# Patient Record
Sex: Male | Born: 1955 | Race: White | Hispanic: No | Smoking: Current every day smoker
Health system: Southern US, Community
[De-identification: ages and names within clinical notes are randomized; demographics above are authoritative.]

## PROBLEM LIST (undated history)

## (undated) DIAGNOSIS — Z85820 Personal history of malignant melanoma of skin: Secondary | ICD-10-CM

## (undated) DIAGNOSIS — K219 Gastro-esophageal reflux disease without esophagitis: Secondary | ICD-10-CM

## (undated) DIAGNOSIS — M719 Bursopathy, unspecified: Secondary | ICD-10-CM

## (undated) DIAGNOSIS — R918 Other nonspecific abnormal finding of lung field: Secondary | ICD-10-CM

## (undated) DIAGNOSIS — E43 Unspecified severe protein-calorie malnutrition: Secondary | ICD-10-CM

## (undated) DIAGNOSIS — J9611 Chronic respiratory failure with hypoxia: Secondary | ICD-10-CM

## (undated) DIAGNOSIS — J449 Chronic obstructive pulmonary disease, unspecified: Secondary | ICD-10-CM

## (undated) DIAGNOSIS — J432 Centrilobular emphysema: Secondary | ICD-10-CM

## (undated) DIAGNOSIS — Z720 Tobacco use: Secondary | ICD-10-CM

## (undated) DIAGNOSIS — Z87442 Personal history of urinary calculi: Secondary | ICD-10-CM

## (undated) DIAGNOSIS — D649 Anemia, unspecified: Secondary | ICD-10-CM

## (undated) DIAGNOSIS — C3490 Malignant neoplasm of unspecified part of unspecified bronchus or lung: Secondary | ICD-10-CM

## (undated) DIAGNOSIS — Z79891 Long term (current) use of opiate analgesic: Secondary | ICD-10-CM

## (undated) DIAGNOSIS — F4320 Adjustment disorder, unspecified: Secondary | ICD-10-CM

## (undated) DIAGNOSIS — R52 Pain, unspecified: Secondary | ICD-10-CM

## (undated) DIAGNOSIS — R079 Chest pain, unspecified: Secondary | ICD-10-CM

## (undated) DIAGNOSIS — G4700 Insomnia, unspecified: Secondary | ICD-10-CM

## (undated) DIAGNOSIS — M109 Gout, unspecified: Secondary | ICD-10-CM

## (undated) DIAGNOSIS — M1990 Unspecified osteoarthritis, unspecified site: Secondary | ICD-10-CM

## (undated) DIAGNOSIS — G8929 Other chronic pain: Secondary | ICD-10-CM

## (undated) DIAGNOSIS — R0902 Hypoxemia: Secondary | ICD-10-CM

## (undated) DIAGNOSIS — I10 Essential (primary) hypertension: Secondary | ICD-10-CM

## (undated) DIAGNOSIS — J45909 Unspecified asthma, uncomplicated: Secondary | ICD-10-CM

## (undated) HISTORY — DX: Personal history of malignant melanoma of skin: Z85.820

## (undated) HISTORY — PX: SKIN GRAFT: SHX250

---

## 2005-01-03 ENCOUNTER — Emergency Department: Payer: Self-pay | Admitting: Emergency Medicine

## 2005-02-12 ENCOUNTER — Emergency Department: Payer: Self-pay | Admitting: Emergency Medicine

## 2005-12-04 ENCOUNTER — Emergency Department: Payer: Self-pay | Admitting: Emergency Medicine

## 2005-12-04 ENCOUNTER — Other Ambulatory Visit: Payer: Self-pay

## 2009-03-12 ENCOUNTER — Emergency Department: Payer: Self-pay | Admitting: Emergency Medicine

## 2009-05-11 ENCOUNTER — Emergency Department: Payer: Self-pay | Admitting: Emergency Medicine

## 2009-06-21 ENCOUNTER — Emergency Department: Payer: Self-pay | Admitting: Emergency Medicine

## 2010-04-21 ENCOUNTER — Emergency Department: Payer: Self-pay | Admitting: Emergency Medicine

## 2010-08-29 ENCOUNTER — Emergency Department: Payer: Self-pay | Admitting: Emergency Medicine

## 2011-09-18 ENCOUNTER — Emergency Department (HOSPITAL_COMMUNITY): Payer: Medicaid Other

## 2011-09-18 ENCOUNTER — Encounter (HOSPITAL_COMMUNITY): Payer: Self-pay | Admitting: *Deleted

## 2011-09-18 ENCOUNTER — Emergency Department (HOSPITAL_COMMUNITY)
Admission: EM | Admit: 2011-09-18 | Discharge: 2011-09-18 | Disposition: A | Discharge status: Home / Self Care | Payer: Medicaid Other | Attending: Emergency Medicine | Admitting: Emergency Medicine

## 2011-09-18 DIAGNOSIS — R079 Chest pain, unspecified: Secondary | ICD-10-CM | POA: Insufficient documentation

## 2011-09-18 DIAGNOSIS — R0789 Other chest pain: Secondary | ICD-10-CM | POA: Insufficient documentation

## 2011-09-18 DIAGNOSIS — M546 Pain in thoracic spine: Secondary | ICD-10-CM | POA: Insufficient documentation

## 2011-09-18 MED ORDER — KETOROLAC TROMETHAMINE 30 MG/ML IJ SOLN
30.0000 mg | Freq: Once | INTRAMUSCULAR | Status: AC
  Administered 2011-09-18: 30 mg via INTRAMUSCULAR
  Filled 2011-09-18: qty 1

## 2011-09-18 MED ORDER — IBUPROFEN 800 MG PO TABS: 800.0000 mg | ORAL_TABLET | Freq: Three times a day (TID) | ORAL | Status: AC

## 2011-09-18 NOTE — ED Provider Notes (Signed)
History     CSN: [id number]  Arrival date & time 09/18/11  0138   First MD Initiated Contact with Patient 09/18/11 0230      Chief Complaint  Patient presents with  . Chest Pain     HPI  History provided by the patient. Patient is a 56 year old male with no significant past medical history who presents with complaints of left back and side pains that began 24 hours ago after lifting a heavy rubber mat. Patient states that he was moving the mat so his wife to clean it. At that time patient's reports feeling a pop below his left shoulder blade and back area. Since that time he has had pain primarily to the back area that does radiate around to the front part of his chest. Patient states she felt it was probably just a muscle pull and was resting all day yesterday. Patient was using heat to the area yesterday without significant improvements. He did not try any medications. Symptoms are worse with movements. Patient denies any alleviating factors. This evening he was driving with his wife for her overnight work shift and he was still having discomfort while driving and having his arm on the steering well. Patient decided to come to emergency room for further evaluation. He denies having any shortness of breath. Patient denies any heart palpitations, nausea, diaphoresis. Patient is a current smoker but denies any other cardiac risk factors.    History reviewed. No pertinent past medical history.  History reviewed. No pertinent past surgical history.  History reviewed. No pertinent family history.  History  Substance Use Topics  . Smoking status: Not on file  . Smokeless tobacco: Not on file  . Alcohol Use: Not on file      Review of Systems  Constitutional: Negative for fever, chills and diaphoresis.  Respiratory: Negative for cough and shortness of breath.   Cardiovascular: Negative for palpitations.  Gastrointestinal: Negative for nausea and abdominal pain.  All other systems  reviewed and are negative.    Allergies  Review of patient's allergies indicates no known allergies.  Home Medications  No current outpatient prescriptions on file.  BP 135/77  Pulse 71  Temp(Src) 98 F (36.7 C) (Oral)  Resp 20  SpO2 99%  Physical Exam  Nursing note and vitals reviewed. Constitutional: He is oriented to person, place, and time. He appears well-developed and well-nourished. No distress.  HENT:  Head: Normocephalic and atraumatic.  Cardiovascular: Normal rate and regular rhythm.   Pulmonary/Chest: Effort normal and breath sounds normal. No respiratory distress. He has no wheezes. He has no rales.   He exhibits tenderness.         Patient with tenderness to palpation along lateral aspect of mid back wrapping around the chest wall and rib area. No deformity of ribs or step-off. No crepitus. Lung sounds are normal and clear.  Abdominal: Soft. Bowel sounds are normal. He exhibits no distension. There is no tenderness. There is no rebound.  Musculoskeletal: He exhibits no edema and no tenderness.  Neurological: He is alert and oriented to person, place, and time.  Skin: Skin is warm. No rash noted.  Psychiatric: He has a normal mood and affect. His behavior is normal.    ED Course  Procedures     Dg Ribs Unilateral W/chest Left  09/18/2011  *RADIOLOGY REPORT*  Clinical Data: Left posterior rib pain and shortness of breath.  No injury.  LEFT RIBS AND CHEST - 3+ VIEW  Comparison: None.  Findings: Normal heart size and pulmonary vascularity. Emphysematous changes and scattered fibrosis in the lungs.  No focal airspace consolidation.  No blunting of costophrenic angles. No pneumothorax.  The left ribs appear intact.  No evidence of acute fracture or subluxation.  No focal lesions or expansile changes suggested. Costochondral calcifications.  Degenerative changes in the thoracic spine.  IMPRESSION: No evidence of active pulmonary disease.  Visualized left ribs appear  intact.  Original Report Authenticated By: Marlon Pel, M.D.     1. Musculoskeletal chest pain       MDM  2:30AM patient seen and evaluated. Patient in no acute distress. Patient with normal respirations and good O2 sats. No clinical signs for DVT. No symptoms concerning for PE.   Patient discussed with attending physician. He agrees with workup and treatment plan.    Date: 09/18/2011  Rate: 76  Rhythm: normal sinus rhythm  QRS Axis: normal  Intervals: normal  ST/T Wave abnormalities: normal  Conduction Disutrbances:none  Narrative Interpretation:   Old EKG Reviewed: none available      Angus Seller, [place] 09/18/11 [phone]

## 2011-09-18 NOTE — ED Notes (Signed)
Pr ready and stable for discharge

## 2011-09-18 NOTE — Discharge Instructions (Signed)
You were seen and evaluated today for your complaints of chest and back pains. Your x-rays today do not show any broken ribs or collapsed lungs or other concerning findings. At this time your providers feel your symptoms are caused from muscle strain. Please use ibuprofen for your symptoms. You can continue to use heat over the area as needed. Please followup with primary care provider.  Musculoskeletal Pain Musculoskeletal pain is muscle and boney aches and pains. These pains can occur in any part of the body. Your caregiver may treat you without knowing the cause of the pain. They may treat you if blood or urine tests, X-rays, and other tests were normal.  CAUSES There is often not a definite cause or reason for these pains. These pains may be caused by a type of germ (virus). The discomfort may also come from overuse. Overuse includes working out too hard when your body is not fit. Boney aches also come from weather changes. Bone is sensitive to atmospheric pressure changes. HOME CARE INSTRUCTIONS   Ask when your test results will be ready. Make sure you get your test results.   Only take over-the-counter or prescription medicines for pain, discomfort, or fever as directed by your caregiver. If you were given medications for your condition, do not drive, operate machinery or power tools, or sign legal documents for 24 hours. Do not drink alcohol. Do not take sleeping pills or other medications that may interfere with treatment.   Continue all activities unless the activities cause more pain. When the pain lessens, slowly resume normal activities. Gradually increase the intensity and duration of the activities or exercise.   During periods of severe pain, bed rest may be helpful. Lay or sit in any position that is comfortable.   Putting ice on the injured area.   Put ice in a bag.   Place a towel between your skin and the bag.   Leave the ice on for 15 to 20 minutes, 3 to 4 times a day.    Follow up with your caregiver for continued problems and no reason can be found for the pain. If the pain becomes worse or does not go away, it may be necessary to repeat tests or do additional testing. Your caregiver may need to look further for a possible cause.  SEEK IMMEDIATE MEDICAL CARE IF:  You have pain that is getting worse and is not relieved by medications.   You develop chest pain that is associated with shortness or breath, sweating, feeling sick to your stomach (nauseous), or throw up (vomit).   Your pain becomes localized to the abdomen.   You develop any new symptoms that seem different or that concern you.  MAKE SURE YOU:   Understand these instructions.   Will watch your condition.   Will get help right away if you are not doing well or get worse.  Document Released: 07/26/2005 Document Revised: 04/07/2011 Document Reviewed: 03/15/2008 [facility] Patient Information 2012 [place], [place].   RESOURCE GUIDE  Dental Problems  Patients with Medicaid: [facility] [phone] [address]                                           [phone] [address] Phone:  [phone]  Phone:  [phone]  If unable to pay or uninsured, contact:  [facility] or [facility]. to become qualified for the adult dental clinic.  Chronic Pain Problems Contact [facility]  [phone] Patients need to be referred by their primary care doctor.  Insufficient Money for Medicine Contact [employer] Way:  call "211" or [facility] (431)883-5804.  No Primary Care Doctor Call [facility]  [phone] Other agencies that provide inexpensive medical care    [facility]  [phone]    [facility]  [phone]    [facility]  [phone]    [facility]  [phone]    [facility]  [phone]    [facility] Clinic   [phone]  Psychological Services [facility] Behavioral Health  [phone] [facility] Services  [phone] [facility] Mental Health   [phone] (emergency services ([phone])  Substance Abuse Resources [facility]  562-165-8286 [facility] [phone] [facility] [phone] Floydene Flock [phone] Residential & Outpatient Substance Abuse Program  [phone]  Abuse/Neglect [facility] Child Abuse Hotline 651-221-1269 [facility] Child Abuse Hotline 281-004-7324 (After Hours)  Emergency Shelter [facility] [phone]  [facility] [place] [facility] [phone] [facility] [phone]  MRSA Hotline #:   825 418 3421    [facility] Resources  [facility]     [employer]                          [facility] Dept. [address] [place]                       [address]      [address]  Blondell Reveal Phone:  [phone]                                   Phone:  [phone]                 Phone:  [phone]  [facility] Mental Health Phone:  [phone]  [facility] Child Abuse Hotline ([phone])244-6093 [phone] (After Hours)

## 2011-09-18 NOTE — ED Notes (Signed)
Pt here with c/o left sided flank pain that radiates to his left chest area. Pt admits to lifting a heavy object yesterday that caused the pain. Rates pain 5/10. Worse on palpation.

## 2011-09-18 NOTE — ED Notes (Signed)
Pt in c/o left chest pain radiating into back since last night with dizziness and shortness of breath, pt original c/o mid back pain

## 2011-09-19 NOTE — ED Provider Notes (Signed)
Medical screening examination/treatment/procedure(s) were performed by non-physician practitioner and as supervising physician I was immediately available for consultation/collaboration.  Keylee Shrestha T Nikitia Asbill, MD 09/19/11 0237 

## 2011-12-14 ENCOUNTER — Emergency Department: Payer: Self-pay | Admitting: Emergency Medicine

## 2014-05-23 ENCOUNTER — Emergency Department: Payer: Self-pay | Admitting: Internal Medicine

## 2014-05-23 LAB — URINALYSIS, COMPLETE
BACTERIA: NONE SEEN
BILIRUBIN, UR: NEGATIVE
Blood: NEGATIVE
GLUCOSE, UR: NEGATIVE mg/dL (ref 0–75)
Ketone: NEGATIVE
LEUKOCYTE ESTERASE: NEGATIVE
Nitrite: NEGATIVE
PH: 5 (ref 4.5–8.0)
PROTEIN: NEGATIVE
RBC, UR: NONE SEEN /HPF (ref 0–5)
SPECIFIC GRAVITY: 1.026 (ref 1.003–1.030)
SQUAMOUS EPITHELIAL: NONE SEEN

## 2014-05-23 LAB — COMPREHENSIVE METABOLIC PANEL
ALBUMIN: 3.7 g/dL (ref 3.4–5.0)
AST: 14 U/L — AB (ref 15–37)
Alkaline Phosphatase: 74 U/L
Anion Gap: 6 — ABNORMAL LOW (ref 7–16)
BUN: 12 mg/dL (ref 7–18)
Bilirubin,Total: 0.4 mg/dL (ref 0.2–1.0)
CALCIUM: 9.2 mg/dL (ref 8.5–10.1)
CHLORIDE: 106 mmol/L (ref 98–107)
CO2: 30 mmol/L (ref 21–32)
Creatinine: 1.26 mg/dL (ref 0.60–1.30)
GLUCOSE: 71 mg/dL (ref 65–99)
OSMOLALITY: 281 (ref 275–301)
POTASSIUM: 3.9 mmol/L (ref 3.5–5.1)
SGPT (ALT): 14 U/L
SODIUM: 142 mmol/L (ref 136–145)
TOTAL PROTEIN: 7.2 g/dL (ref 6.4–8.2)

## 2014-05-23 LAB — ETHANOL: Ethanol: <3 mg/dL (ref 0–80)

## 2014-05-23 LAB — DRUG SCREEN, URINE
Amphetamines, Ur Screen: NEGATIVE (ref ?–1000)
BARBITURATES, UR SCREEN: NEGATIVE (ref ?–200)
Benzodiazepine, Ur Scrn: NEGATIVE (ref ?–200)
CANNABINOID 50 NG, UR ~~LOC~~: NEGATIVE (ref ?–50)
Cocaine Metabolite,Ur ~~LOC~~: POSITIVE (ref ?–300)
MDMA (ECSTASY) UR SCREEN: NEGATIVE (ref ?–500)
METHADONE, UR SCREEN: NEGATIVE (ref ?–300)
OPIATE, UR SCREEN: NEGATIVE (ref ?–300)
PHENCYCLIDINE (PCP) UR S: NEGATIVE (ref ?–25)
TRICYCLIC, UR SCREEN: NEGATIVE (ref ?–1000)

## 2014-05-23 LAB — CBC
HCT: 43.6 % (ref 40.0–52.0)
HGB: 14.3 g/dL (ref 13.0–18.0)
MCH: 29.9 pg (ref 26.0–34.0)
MCHC: 32.8 g/dL (ref 32.0–36.0)
MCV: 91 fL (ref 80–100)
Platelet: 213 10*3/uL (ref 150–440)
RBC: 4.79 10*6/uL (ref 4.40–5.90)
RDW: 13.7 % (ref 11.5–14.5)
WBC: 4 10*3/uL (ref 3.8–10.6)

## 2014-05-23 LAB — SALICYLATE LEVEL: SALICYLATES, SERUM: 2.9 mg/dL — AB

## 2014-05-23 LAB — ACETAMINOPHEN LEVEL

## 2014-11-30 NOTE — Consult Note (Signed)
PATIENT NAME:  David [name], David [name] MR#:  [id number] DATE OF BIRTH:  1956/06/01  DATE OF CONSULTATION:  05/23/2014   CONSULTING PHYSICIAN:  Gonzella Lex, MD   IDENTIFYING INFORMATION AND REASON FOR CONSULT:  A 59 year old man with a history of cocaine abuse.   CHIEF COMPLAINT:  "Stop using crack."   HISTORY OF PRESENT ILLNESS: Information obtained from the patient and the chart. The patient came in today saying that he wanted to stop using crack cocaine. He has been using steadily since about 2003.  It seems like he and his girlfriend decided together that they wanted to stop.  She is in [facility] and so they will not take him right now.  He is not using any other drugs. Not using any alcohol. Says that his mood is messed up and that he sleeps poorly at night, but he totally denies any suicidal ideation. Denies any hallucinations. Physically, he seems to be fairly stable. He shows adequate insight, seems to be free of psychotic symptoms.   PAST PSYCHIATRIC HISTORY:  He has been to [facility] but it has been since the 1990s that he was there.  No other substance abuse treatment that he has ever engaged in apparently. Never been prescribed any medicine for psychiatric illness. No other psychiatric hospitalizations that he knows of.   SOCIAL HISTORY:  The patient gets SSI for some reason or another.  Lives with his girlfriend and his daughter.  Does not work. Spends his time mostly using cocaine or recovering from it.   PAST MEDICAL HISTORY: Denies any specific medical problems. No history of heart disease. No history of lung disease. No history of any other specific illness.   SUBSTANCE ABUSE HISTORY:  Denies any history of alcohol or opiate abuse, long-standing cocaine abuse. As mentioned above, he was at [facility] many, many years ago and has not had any other treatment since then.   FAMILY HISTORY: Positive family history for alcohol problems.   REVIEW OF SYSTEMS: Denies depression. Denies suicidal ideation.  Denies homicidal ideation.  Denies hallucinations. Feeling a little bit tired and run down, but the rest of the full physical review of systems is negative.   CURRENT MEDICATIONS: None.   ALLERGIES: No known drug allergies.   MENTAL STATUS EXAMINATION:  The patient looks older than his stated age.  Passively cooperative. Minimal eye intact. Mostly focused on eating his meal. Speech is normal in tone, decreased in total amount. Affect is euthymic. A little constricted. Mood stated as being messed up.  Thoughts appear lucid, but some impoverishment of thinking.  No evidence of delusions. Denies hallucinations. Denies suicidal or homicidal ideation. Could remember 3/3 objects immediately and 3/3 at 5 minutes. He was alert and oriented x 4.  Judgment and insight seem to be adequate to the circumstances.   LABORATORY RESULTS:  Chest x-ray showed stable bilateral nodules.  Alcohol level negative.  Salicylates slightly elevated.  Chemistry normal.  CBC normal. Urinalysis unremarkable. Drug screen positive for cocaine.   VITAL SIGNS: Blood pressure currently 102/64, respirations 18, pulse 75, temperature 98.7.   ASSESSMENT:  A 59 year old man with cocaine dependence. Not significantly depressed, no suicidal ideation.  No immediate dangerous behavior.  No sign of psychosis.  Not using alcohol, not needing physiologic detoxification.   The patient does not require hospital level treatment.   TREATMENT PLAN: Supportive and educational counseling done.  Encouragement completed about the importance of staying focused on staying sober and getting involved in outpatient treatment.  We are  told that he could go to [facility] once his girlfriend leaves there and he is aware of  that, that he can switch out with her probably in a few days. The patient is not under involuntary commitment. Recommend discharge from the Emergency Room.   DIAGNOSIS, PRINCIPAL AND PRIMARY:  AXIS I: Cocaine abuse, severe.   SECONDARY  DIAGNOSES:  AXIS I: No further.  AXIS II: No diagnosis.     ____________________________ Gonzella Lex, MD jtc:DT D: 05/23/2014 16:53:04 ET T: 05/23/2014 17:14:45 ET JOB#: [id number]  cc: Gonzella Lex, MD, <Dictator> Gonzella Lex MD ELECTRONICALLY SIGNED 05/29/2014 16:16

## 2015-08-11 ENCOUNTER — Emergency Department
Admission: EM | Admit: 2015-08-11 | Discharge: 2015-08-11 | Disposition: A | Discharge status: Home / Self Care | Payer: Medicaid Other | Attending: Emergency Medicine | Admitting: Emergency Medicine

## 2015-08-11 ENCOUNTER — Emergency Department: Payer: Medicaid Other

## 2015-08-11 ENCOUNTER — Encounter: Payer: Self-pay | Admitting: Emergency Medicine

## 2015-08-11 DIAGNOSIS — F17200 Nicotine dependence, unspecified, uncomplicated: Secondary | ICD-10-CM | POA: Insufficient documentation

## 2015-08-11 DIAGNOSIS — J069 Acute upper respiratory infection, unspecified: Secondary | ICD-10-CM

## 2015-08-11 DIAGNOSIS — R05 Cough: Secondary | ICD-10-CM | POA: Diagnosis present

## 2015-08-11 DIAGNOSIS — R911 Solitary pulmonary nodule: Secondary | ICD-10-CM

## 2015-08-11 LAB — COMPREHENSIVE METABOLIC PANEL
ALBUMIN: 3.8 g/dL (ref 3.5–5.0)
ALT: 11 U/L — ABNORMAL LOW (ref 17–63)
AST: 15 U/L (ref 15–41)
Alkaline Phosphatase: 59 U/L (ref 38–126)
Anion gap: 5 (ref 5–15)
BILIRUBIN TOTAL: 0.5 mg/dL (ref 0.3–1.2)
BUN: 14 mg/dL (ref 6–20)
CO2: 29 mmol/L (ref 22–32)
Calcium: 8.6 mg/dL — ABNORMAL LOW (ref 8.9–10.3)
Chloride: 103 mmol/L (ref 101–111)
Creatinine, Ser: 1.07 mg/dL (ref 0.61–1.24)
GFR calc Af Amer: >60 mL/min (ref >60)
GFR calc non Af Amer: >60 mL/min (ref >60)
GLUCOSE: 96 mg/dL (ref 65–99)
POTASSIUM: 3.9 mmol/L (ref 3.5–5.1)
Sodium: 137 mmol/L (ref 135–145)
TOTAL PROTEIN: 6.9 g/dL (ref 6.5–8.1)

## 2015-08-11 MED ORDER — AZITHROMYCIN 250 MG PO TABS: ORAL_TABLET | ORAL | Status: DC

## 2015-08-11 MED ORDER — GUAIFENESIN-CODEINE 100-10 MG/5ML PO SOLN: 10.0000 mL | ORAL | Status: DC | PRN

## 2015-08-11 MED ORDER — IOHEXOL 300 MG/ML  SOLN
75.0000 mL | Freq: Once | INTRAMUSCULAR | Status: AC | PRN
  Administered 2015-08-11: 75 mL via INTRAVENOUS
  Filled 2015-08-11: qty 75

## 2015-08-11 NOTE — ED Notes (Signed)
Pt to ed with c/o cough, congestion, sore throat and fever x 2 weeks.  Pt in no acute resp distress at triage.

## 2015-08-11 NOTE — ED Notes (Signed)
States he has had nasal and chest congestion for about 2 weeks . Unsure of fever. Min relief with OTC robitussin

## 2015-08-11 NOTE — ED Provider Notes (Signed)
[facility] Emergency Department Provider Note ____________________________________________  Time seen: Approximately 10:03 AM  I have reviewed the triage vital signs and the nursing notes.   HISTORY  Chief Complaint Cough; Nasal Congestion; and Fever    HPI David [name] is a 60 y.o. male who is a current every day smoker presents with complaints of cough congestion sore throat and fever for 2 weeks. Patient states that he is no respiratory distress at this time. No relief with over-the-counter medication.   History reviewed. No pertinent past medical history.  There are no active problems to display for this patient.   History reviewed. No pertinent past surgical history.  Current Outpatient Rx  Name  Route  Sig  Dispense  Refill  . azithromycin (ZITHROMAX Z-PAK) 250 MG tablet      Take 2 tablets (500 mg) on  Day 1,  followed by 1 tablet (250 mg) once daily on Days 2 through 5.   6 each   0   . guaiFENesin-codeine 100-10 MG/5ML syrup   Oral   Take 10 mLs by mouth every 4 (four) hours as needed for cough.   180 mL   0     Allergies Review of patient's allergies indicates no known allergies.  No family history on file.  Social History Social History  Substance Use Topics  . Smoking status: Current Every Day Smoker  . Smokeless tobacco: None  . Alcohol Use: No    Review of Systems Constitutional: Occasional fever/chills Eyes: No visual changes. ENT: No sore throat. As a for nasal congestion. Cardiovascular: Denies chest pain. Respiratory: Denies shortness of breath. Positive for cough Gastrointestinal: No abdominal pain.  No nausea, no vomiting.  No diarrhea.  No constipation. Genitourinary: Negative for dysuria. Musculoskeletal: Negative for back pain. Skin: Negative for rash. Neurological: Negative for headaches, focal weakness or numbness.  10-point ROS otherwise  negative.  ____________________________________________   PHYSICAL EXAM:  VITAL SIGNS: ED Triage Vitals  Enc Vitals Group     BP 08/11/15 1000 124/68 mmHg     Pulse Rate 08/11/15 1000 86     Resp 08/11/15 1000 20     Temp 08/11/15 1000 98.1 F (36.7 C)     Temp Source 08/11/15 1000 Oral     SpO2 08/11/15 1000 98 %     Weight 08/11/15 1000 150 lb (68.04 kg)     Height 08/11/15 1000 '5\' 10"'$  (1.778 m)     Head Cir --      Peak Flow --      Pain Score 08/11/15 0957 8     Pain Loc --      Pain Edu? --      Excl. in [place]? --     Constitutional: Alert and oriented. Well appearing and in no acute distress. Eyes: Conjunctivae are normal. PERRL. EOMI. Head: Atraumatic. Nose: Positive congestion/rhinnorhea. Positive for swollen turbinates Mouth/Throat: Mucous membranes are moist.  Oropharynx non-erythematous. Neck: No stridor.   Cardiovascular: Normal rate, regular rhythm. Grossly normal heart sounds.  Good peripheral circulation. Respiratory: Normal respiratory effort.  No retractions. Lungs with decreased breath sounds noted bilaterally. Musculoskeletal: No lower extremity tenderness nor edema.  No joint effusions. Neurologic:  Normal speech and language. No gross focal neurologic deficits are appreciated. No gait instability. Skin:  Skin is warm, dry and intact. No rash noted. Psychiatric: Mood and affect are normal. Speech and behavior are normal.  ____________________________________________   LABS (all labs ordered are listed, but only abnormal  results are displayed)  Labs Reviewed  COMPREHENSIVE METABOLIC PANEL - Abnormal; Notable for the following:    Calcium 8.6 (*)    ALT 11 (*)    All other components within normal limits   RADIOLOGY: FINDINGS: The cardiomediastinal silhouette is unremarkable.  COPD/ emphysema noted.  A 1.6 cm right upper lobe nodule is identified -chest CT recommended.  There is no evidence of focal airspace disease, pulmonary  edema, pleural effusion, or pneumothorax.  No acute bony abnormalities are identified.  IMPRESSION: 1.6 cm right upper lobe nodule -chest CT recommended.  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  IMPRESSION: 2.6 cm lobulated right lung mass, bilateral pulmonary nodules, and right hilar adenopathy are worrisome for advanced malignancy. PET-CT is recommended.   ____________________________________________    PROCEDURES  Procedure(s) performed: None  Critical Care performed: No  ____________________________________________   INITIAL IMPRESSION / ASSESSMENT AND PLAN / ED COURSE  Pertinent labs & imaging results that were available during my care of the patient were reviewed by me and considered in my medical decision making (see chart for details).  Initial radiological exam demonstrates a lesion in the right upper lobe. Recommendation is a chest CT per radiologist. Chest CT demonstrates a lobulated right lung mass. Discussed all clinical findings with the patient he will follow up with oncology next week. Local phone number provided for Dr. Grayland Ormond. ____________________________________________   FINAL CLINICAL IMPRESSION(S) / ED DIAGNOSES  Final diagnoses:  Acute URI  Lesion of right lung     Arlyss Repress, PA-C 08/11/15 1200  Delman Kitten, MD 08/11/15 1552

## 2015-08-11 NOTE — Discharge Instructions (Signed)
Cough, Adult Coughing is a reflex that clears your throat and your airways. Coughing helps to heal and protect your lungs. It is normal to cough occasionally, but a cough that happens with other symptoms or lasts a long time may be a sign of a condition that needs treatment. A cough may last only 2-3 weeks (acute), or it may last longer than 8 weeks (chronic). CAUSES Coughing is commonly caused by:  Breathing in substances that irritate your lungs.  A viral or bacterial respiratory infection.  Allergies.  Asthma.  Postnasal drip.  Smoking.  Acid backing up from the stomach into the esophagus (gastroesophageal reflux).  Certain medicines.  Chronic lung problems, including COPD (or rarely, lung cancer).  Other medical conditions such as heart failure. HOME CARE INSTRUCTIONS  Pay attention to any changes in your symptoms. Take these actions to help with your discomfort:  Take medicines only as told by your health care provider.  If you were prescribed an antibiotic medicine, take it as told by your health care provider. Do not stop taking the antibiotic even if you start to feel better.  Talk with your health care provider before you take a cough suppressant medicine.  Drink enough fluid to keep your urine clear or pale yellow.  If the air is dry, use a cold steam vaporizer or humidifier in your bedroom or your home to help loosen secretions.  Avoid anything that causes you to cough at work or at home.  If your cough is worse at night, try sleeping in a semi-upright position.  Avoid cigarette smoke. If you smoke, quit smoking. If you need help quitting, ask your health care provider.  Avoid caffeine.  Avoid alcohol.  Rest as needed. SEEK MEDICAL CARE IF:   You have new symptoms.  You cough up pus.  Your cough does not get better after 2-3 weeks, or your cough gets worse.  You cannot control your cough with suppressant medicines and you are losing sleep.  You  develop pain that is getting worse or pain that is not controlled with pain medicines.  You have a fever.  You have unexplained weight loss.  You have night sweats. SEEK IMMEDIATE MEDICAL CARE IF:  You cough up blood.  You have difficulty breathing.  Your heartbeat is very fast.   This information is not intended to replace advice given to you by your health care provider. Make sure you discuss any questions you have with your health care provider.   Document Released: 01/22/2011 Document Revised: 04/16/2015 Document Reviewed: 10/02/2014 Elsevier Interactive Patient Education 2016 [employer]  Upper Respiratory Infection, Adult Most upper respiratory infections (URIs) are a viral infection of the air passages leading to the lungs. A URI affects the nose, throat, and upper air passages. The most common type of URI is nasopharyngitis and is typically referred to as "the common cold." URIs run their course and usually go away on their own. Most of the time, a URI does not require medical attention, but sometimes a bacterial infection in the upper airways can follow a viral infection. This is called a secondary infection. Sinus and middle ear infections are common types of secondary upper respiratory infections. Bacterial pneumonia can also complicate a URI. A URI can worsen asthma and chronic obstructive pulmonary disease (COPD). Sometimes, these complications can require emergency medical care and may be life threatening.  CAUSES Almost all URIs are caused by viruses. A virus is a type of germ and can spread from one  person to another.  RISKS FACTORS You may be at risk for a URI if:   You smoke.   You have chronic heart or lung disease.  You have a weakened defense (immune) system.   You are very young or very old.   You have nasal allergies or asthma.  You work in crowded or poorly ventilated areas.  You work in health care facilities or schools. SIGNS AND SYMPTOMS    Symptoms typically develop 2-3 days after you come in contact with a cold virus. Most viral URIs last 7-10 days. However, viral URIs from the influenza virus (flu virus) can last 14-18 days and are typically more severe. Symptoms may include:   Runny or stuffy (congested) nose.   Sneezing.   Cough.   Sore throat.   Headache.   Fatigue.   Fever.   Loss of appetite.   Pain in your forehead, behind your eyes, and over your cheekbones (sinus pain).  Muscle aches.  DIAGNOSIS  Your health care provider may diagnose a URI by:  Physical exam.  Tests to check that your symptoms are not due to another condition such as:  Strep throat.  Sinusitis.  Pneumonia.  Asthma. TREATMENT  A URI goes away on its own with time. It cannot be cured with medicines, but medicines may be prescribed or recommended to relieve symptoms. Medicines may help:  Reduce your fever.  Reduce your cough.  Relieve nasal congestion. HOME CARE INSTRUCTIONS   Take medicines only as directed by your health care provider.   Gargle warm saltwater or take cough drops to comfort your throat as directed by your health care provider.  Use a warm mist humidifier or inhale steam from a shower to increase air moisture. This may make it easier to breathe.  Drink enough fluid to keep your urine clear or pale yellow.   Eat soups and other clear broths and maintain good nutrition.   Rest as needed.   Return to work when your temperature has returned to normal or as your health care provider advises. You may need to stay home longer to avoid infecting others. You can also use a face mask and careful hand washing to prevent spread of the virus.  Increase the usage of your inhaler if you have asthma.   Do not use any tobacco products, including cigarettes, chewing tobacco, or electronic cigarettes. If you need help quitting, ask your health care provider. PREVENTION  The best way to protect  yourself from getting a cold is to practice good hygiene.   Avoid oral or hand contact with people with cold symptoms.   Wash your hands often if contact occurs.  There is no clear evidence that vitamin C, vitamin E, echinacea, or exercise reduces the chance of developing a cold. However, it is always recommended to get plenty of rest, exercise, and practice good nutrition.  SEEK MEDICAL CARE IF:   You are getting worse rather than better.   Your symptoms are not controlled by medicine.   You have chills.  You have worsening shortness of breath.  You have brown or red mucus.  You have yellow or brown nasal discharge.  You have pain in your face, especially when you bend forward.  You have a fever.  You have swollen neck glands.  You have pain while swallowing.  You have white areas in the back of your throat. SEEK IMMEDIATE MEDICAL CARE IF:   You have severe or persistent:  Headache.  Ear pain.  Sinus pain.  Chest pain.  You have chronic lung disease and any of the following:  Wheezing.  Prolonged cough.  Coughing up blood.  A change in your usual mucus.  You have a stiff neck.  You have changes in your:  Vision.  Hearing.  Thinking.  Mood. MAKE SURE YOU:   Understand these instructions.  Will watch your condition.  Will get help right away if you are not doing well or get worse.   This information is not intended to replace advice given to you by your health care provider. Make sure you discuss any questions you have with your health care provider.   Document Released: 01/19/2001 Document Revised: 12/10/2014 Document Reviewed: 10/31/2013 Elsevier Interactive Patient Education [employer].

## 2015-08-13 ENCOUNTER — Encounter: Payer: Self-pay | Admitting: Oncology

## 2015-08-13 ENCOUNTER — Inpatient Hospital Stay: Payer: Medicaid Other | Attending: Oncology | Admitting: Oncology

## 2015-08-13 ENCOUNTER — Encounter: Payer: Self-pay | Admitting: *Deleted

## 2015-08-13 VITALS — BP 105/69 | HR 70 | Temp 97.4°F | Resp 16 | Ht 69.29 in | Wt 131.4 lb

## 2015-08-13 DIAGNOSIS — I2510 Atherosclerotic heart disease of native coronary artery without angina pectoris: Secondary | ICD-10-CM | POA: Diagnosis not present

## 2015-08-13 DIAGNOSIS — J449 Chronic obstructive pulmonary disease, unspecified: Secondary | ICD-10-CM | POA: Insufficient documentation

## 2015-08-13 DIAGNOSIS — Z85820 Personal history of malignant melanoma of skin: Secondary | ICD-10-CM

## 2015-08-13 DIAGNOSIS — R05 Cough: Secondary | ICD-10-CM | POA: Insufficient documentation

## 2015-08-13 DIAGNOSIS — C3491 Malignant neoplasm of unspecified part of right bronchus or lung: Secondary | ICD-10-CM | POA: Diagnosis not present

## 2015-08-13 DIAGNOSIS — R634 Abnormal weight loss: Secondary | ICD-10-CM | POA: Diagnosis not present

## 2015-08-13 DIAGNOSIS — F17210 Nicotine dependence, cigarettes, uncomplicated: Secondary | ICD-10-CM | POA: Insufficient documentation

## 2015-08-13 DIAGNOSIS — R911 Solitary pulmonary nodule: Secondary | ICD-10-CM | POA: Diagnosis not present

## 2015-08-13 DIAGNOSIS — Z79899 Other long term (current) drug therapy: Secondary | ICD-10-CM | POA: Diagnosis not present

## 2015-08-13 NOTE — Progress Notes (Signed)
Patient presented ER with cough and cold symptoms.  A lung nodule was noted on chest CT in ER.  His family reports that he does have history of Melanoma that was treated at [facility].

## 2015-08-13 NOTE — Progress Notes (Signed)
[facility]  [phone] [phone]  ID: David [name] OB: 06/28/1956  MR#: [id number]  YBO#:[id number]  Patient Care Team: No Pcp Per Patient as PCP - General (General Practice)  CHIEF COMPLAINT:  Chief Complaint  Patient presents with  . New Evaluation    lung nodule seen on CT    INTERVAL HISTORY: Patient is a 60 year old male who presented to the emergency room with persistent cough and congestion. Subsequent workup included a CT scan which revealed a 2.6 right upper lobe lesion concerning for underlying malignancy. Currently, he continues to have cough but otherwise feels well. He has no neurologic complaints. He admits to unintentional weight loss, but is unclear how much. He denies any fevers. He denies any chest pain or hemoptysis. He denies any nausea, vomiting, constipation, or diarrhea. He has no urinary complaints. Patient otherwise feels well and offers no further specific complaints.  REVIEW OF SYSTEMS:   Review of Systems  Constitutional: Positive for fever and weight loss. Negative for malaise/fatigue.  Respiratory: Positive for cough and shortness of breath. Negative for hemoptysis.   Cardiovascular: Negative.  Negative for chest pain.  Gastrointestinal: Negative.   Musculoskeletal: Negative.   Neurological: Negative.  Negative for weakness and headaches.    As per HPI. Otherwise, a complete review of systems is negatve.  PAST MEDICAL HISTORY: Past Medical History  Diagnosis Date  . History of melanoma   . History of squamous cell carcinoma     PAST SURGICAL HISTORY: Past Surgical History  Procedure Laterality Date  . Skin graft      [facility]    FAMILY HISTORY: Reviewed and unchanged. No reported history of malignancy or chronic disease.     ADVANCED DIRECTIVES:    HEALTH MAINTENANCE: Social History  Substance Use Topics  . Smoking status: Current Every Day Smoker -- 1.50 packs/day    Types: Cigarettes   Start date: 08/12/1994  . Smokeless tobacco: Never Used  . Alcohol Use: No     Colonoscopy:  PAP:  Bone density:  Lipid panel:  No Known Allergies  Current Outpatient Prescriptions  Medication Sig Dispense Refill  . azithromycin (ZITHROMAX Z-PAK) 250 MG tablet Take 2 tablets (500 mg) on  Day 1,  followed by 1 tablet (250 mg) once daily on Days 2 through 5. 6 each 0  . guaiFENesin-codeine 100-10 MG/5ML syrup Take 10 mLs by mouth every 4 (four) hours as needed for cough. 180 mL 0   No current facility-administered medications for this visit.    OBJECTIVE: Filed Vitals:   08/13/15 1145  BP: 105/69  Pulse: 70  Temp: 97.4 F (36.3 C)  Resp: 16     Body mass index is 19.24 kg/(m^2).    ECOG FS:0 - Asymptomatic  General: Thin, no acute distress. Eyes: Pink conjunctiva, anicteric sclera. HEENT: Normocephalic, moist mucous membranes, clear oropharnyx. Lungs: Clear to auscultation bilaterally. Heart: Regular rate and rhythm. No rubs, murmurs, or gallops. Abdomen: Soft, nontender, nondistended. No organomegaly noted, normoactive bowel sounds. Musculoskeletal: No edema, cyanosis, or clubbing. Neuro: Alert, answering all questions appropriately. Cranial nerves grossly intact. Skin: No rashes or petechiae noted. Psych: Normal affect. Lymphatics: No cervical, calvicular, axillary or inguinal LAD.   LAB RESULTS:  Lab Results  Component Value Date   NA 137 08/11/2015   K 3.9 08/11/2015   CL 103 08/11/2015   CO2 29 08/11/2015   GLUCOSE 96 08/11/2015   BUN 14 08/11/2015   CREATININE 1.07 08/11/2015   CALCIUM 8.6* 08/11/2015  PROT 6.9 08/11/2015   ALBUMIN 3.8 08/11/2015   AST 15 08/11/2015   ALT 11* 08/11/2015   ALKPHOS 59 08/11/2015   BILITOT 0.5 08/11/2015   GFRNONAA >60 08/11/2015   GFRAA >60 08/11/2015    Lab Results  Component Value Date   WBC 4.0 05/23/2014   HGB 14.3 05/23/2014   HCT 43.6 05/23/2014   MCV 91 05/23/2014   PLT 213 05/23/2014      STUDIES: Dg Chest 2 View  08/11/2015  CLINICAL DATA:  60 year old male with cough congestion and fever for 2 weeks. EXAM: CHEST  2 VIEW COMPARISON:  05/23/2014 and prior exams. FINDINGS: The cardiomediastinal silhouette is unremarkable. COPD/ emphysema noted. A 1.6 cm right upper lobe nodule is identified -chest CT recommended. There is no evidence of focal airspace disease, pulmonary edema, pleural effusion, or pneumothorax. No acute bony abnormalities are identified. IMPRESSION: 1.6 cm right upper lobe nodule -chest CT recommended. 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 Electronically Signed   By: Margarette Canada M.D.   On: 08/11/2015 10:30   Ct Chest W Contrast  08/11/2015  CLINICAL DATA:  Cough EXAM: CT CHEST WITH CONTRAST TECHNIQUE: Multidetector CT imaging of the chest was performed during intravenous contrast administration. CONTRAST:  33m OMNIPAQUE IOHEXOL 300 MG/ML  SOLN COMPARISON:  12/05/2005 FINDINGS: There is a 2.6 x 1.7 cm lobulated mass in the right upper lobe worrisome for malignancy. There are several bilateral scattered pulmonary nodules measuring 7 mm or less in diameter. There is an enlarged right hilar node with a short axis diameter of 17 mm. Small mediastinal and left hilar nodes are noted. Severe panlobular emphysema throughout both lungs. No pneumothorax.  No pleural effusion. No destructive bone lesion IMPRESSION: 2.6 cm lobulated right lung mass, bilateral pulmonary nodules, and right hilar adenopathy are worrisome for advanced malignancy. PET-CT is recommended. Electronically Signed   By: AMarybelle KillingsM.D.   On: 08/11/2015 11:44    ASSESSMENT: Right upper lobe pulmonary nodule, concerning for underlying malignancy.  PLAN:    1. Pulmonary nodule: CT scan results reviewed independently and reported as above concerning for underlying malignancy. Patient also has bilateral subcentimeter pulmonary nodules of unclear etiology, but these are likely below the  resolution of PET scan.   Case was discussed with Dr. OGenevive Bias patient may be a potential surgical candidate. Will get PET scan as well as pulmonary function tests over the next week. Return to clinic in 1 week to discuss the results and treatment planning. 2. Weight loss: Likely multifactorial, unclear how much. Monitor. 3. History of melanoma: Patient is a poor historian but appears to have been greater than 3 years ago. Monitor.  Approximate 45 minutes was spent in discussion of which greater than 50% was consultation.  Patient expressed understanding and was in agreement with this plan. He also understands that He can call clinic at any time with any questions, concerns, or complaints.   TLloyd Huger MD   08/13/2015 1:53 PM

## 2015-08-14 NOTE — Progress Notes (Signed)
  Oncology Nurse Navigator Documentation    Navigator Encounter Type: Initial MedOnc (08/14/15 0600)             Treatment Phase: Abnormal Scans (08/14/15 0600) Barriers/Navigation Needs: Family concerns;Coordination of Care (08/14/15 0600)   Interventions: Coordination of Care (08/14/15 0600)   Coordination of Care: Radiology (08/14/15 0600)                  Time Spent with Patient: 30 (08/14/15 0600)  Met with patient at medical oncology appointment. Introduced Programmer, multimedia and reviewed plan of care. Will follow.

## 2015-08-19 ENCOUNTER — Ambulatory Visit: Payer: Medicaid Other | Attending: Oncology

## 2015-08-19 DIAGNOSIS — R911 Solitary pulmonary nodule: Secondary | ICD-10-CM | POA: Diagnosis not present

## 2015-08-19 MED ORDER — ALBUTEROL SULFATE (2.5 MG/3ML) 0.083% IN NEBU
2.5000 mg | INHALATION_SOLUTION | Freq: Once | RESPIRATORY_TRACT | Status: AC
  Administered 2015-08-19: 2.5 mg via RESPIRATORY_TRACT
  Filled 2015-08-19: qty 3

## 2015-08-20 ENCOUNTER — Encounter
Admission: RE | Admit: 2015-08-20 | Discharge: 2015-08-20 | Disposition: A | Discharge status: Home / Self Care | Payer: Medicaid Other | Source: Ambulatory Visit | Attending: Oncology | Admitting: Oncology

## 2015-08-20 DIAGNOSIS — R911 Solitary pulmonary nodule: Secondary | ICD-10-CM | POA: Insufficient documentation

## 2015-08-21 ENCOUNTER — Ambulatory Visit
Admission: RE | Admit: 2015-08-21 | Discharge: 2015-08-21 | Disposition: A | Discharge status: Home / Self Care | Payer: Medicaid Other | Source: Ambulatory Visit | Attending: Oncology | Admitting: Oncology

## 2015-08-21 ENCOUNTER — Inpatient Hospital Stay (HOSPITAL_BASED_OUTPATIENT_CLINIC_OR_DEPARTMENT_OTHER): Payer: Medicaid Other | Admitting: Cardiothoracic Surgery

## 2015-08-21 ENCOUNTER — Inpatient Hospital Stay (HOSPITAL_BASED_OUTPATIENT_CLINIC_OR_DEPARTMENT_OTHER): Payer: Medicaid Other | Admitting: Oncology

## 2015-08-21 ENCOUNTER — Encounter: Payer: Self-pay | Admitting: Cardiothoracic Surgery

## 2015-08-21 VITALS — BP 112/69 | HR 72 | Temp 97.6°F | Ht 69.0 in | Wt 134.3 lb

## 2015-08-21 DIAGNOSIS — Z79899 Other long term (current) drug therapy: Secondary | ICD-10-CM

## 2015-08-21 DIAGNOSIS — R911 Solitary pulmonary nodule: Secondary | ICD-10-CM

## 2015-08-21 DIAGNOSIS — Z85820 Personal history of malignant melanoma of skin: Secondary | ICD-10-CM

## 2015-08-21 DIAGNOSIS — R918 Other nonspecific abnormal finding of lung field: Secondary | ICD-10-CM | POA: Insufficient documentation

## 2015-08-21 DIAGNOSIS — J439 Emphysema, unspecified: Secondary | ICD-10-CM | POA: Insufficient documentation

## 2015-08-21 DIAGNOSIS — R05 Cough: Secondary | ICD-10-CM

## 2015-08-21 DIAGNOSIS — I2510 Atherosclerotic heart disease of native coronary artery without angina pectoris: Secondary | ICD-10-CM | POA: Diagnosis not present

## 2015-08-21 DIAGNOSIS — R590 Localized enlarged lymph nodes: Secondary | ICD-10-CM | POA: Insufficient documentation

## 2015-08-21 DIAGNOSIS — R634 Abnormal weight loss: Secondary | ICD-10-CM | POA: Diagnosis not present

## 2015-08-21 DIAGNOSIS — F17210 Nicotine dependence, cigarettes, uncomplicated: Secondary | ICD-10-CM

## 2015-08-21 DIAGNOSIS — C3491 Malignant neoplasm of unspecified part of right bronchus or lung: Secondary | ICD-10-CM | POA: Diagnosis not present

## 2015-08-21 LAB — GLUCOSE, CAPILLARY: GLUCOSE-CAPILLARY: 75 mg/dL (ref 65–99)

## 2015-08-21 MED ORDER — FLUDEOXYGLUCOSE F - 18 (FDG) INJECTION
12.3700 | Freq: Once | INTRAVENOUS | Status: AC | PRN
  Administered 2015-08-21: 12.37 via INTRAVENOUS

## 2015-08-21 NOTE — Progress Notes (Signed)
Patient ID: David [name], male   DOB: 02-15-1956, 60 y.o.   MRN: [id number]  Chief Complaint  Patient presents with  . Results    Referred By Dr. Grayland Ormond Reason for Referral right upper lobe mass  HPI Location, Quality, Duration, Severity, Timing, Context, Modifying Factors, Associated Signs and Symptoms.  David [name] is a 60 y.o. male.  He presented to the emergency department with complaints of a couple week history of cough fever and a generalized feeling of being unwell. The chest x-ray was interpreted as showing a right upper lobe mass and subsequent CT scan showed the presence of a right upper lobe mass with hilar and mediastinal adenopathy. In addition there were several other nodules scattered throughout the lungs. The patient does have a history of melanoma on his left cheek which has been resected. He's been smoking since the age of 11 and currently smokes 1-1/2 pack cigarettes per day. He states he does not get short of breath. He is currently unemployed. He is on disability secondary to some mental deficiencies according to the patient. He states he is able to do anything he would like to do and doesn't complain of any shortness of breath. He did have a PET scan done in pulmonary function studies done. This pulmonary function studies revealed DLCO that is 52% of predicted and it FEV1 at 78% of predicted. He does continue to smoke 1.5 packs per cigarettes per day. He has a family history of lung cancer in his mother. He denies any weight loss fevers chills night sweats. He denied any hemoptysis.   Past Medical History  Diagnosis Date  . History of melanoma   . History of squamous cell carcinoma     Past Surgical History  Procedure Laterality Date  . Skin graft      [facility]    History reviewed. No pertinent family history.  Social History Social History  Substance Use Topics  . Smoking status: Current Every Day Smoker -- 1.50 packs/day    Types: Cigarettes    Start date:  08/12/1994  . Smokeless tobacco: Never Used  . Alcohol Use: No    No Known Allergies  No current outpatient prescriptions on file.   No current facility-administered medications for this visit.      Review of Systems A complete review of systems was asked and was negative except for the following positive findings cough, hearing difficulty, depression.  Blood pressure 112/69, pulse 72, temperature 97.6 F (36.4 C), temperature source Tympanic, height '5\' 9"'$  (1.753 m), weight 134 lb 4.2 oz (60.9 kg), SpO2 98 %.  Physical Exam CONSTITUTIONAL:  Pleasant, well-developed, well-nourished, and in no acute distress.  He appears much older than his stated age EYES: Pupils equal and reactive to light, Sclera non-icteric EARS, NOSE, MOUTH AND THROAT:  The oropharynx was clear. He is edentulous.  Oral mucosa pink and moist. LYMPH NODES:  Lymph nodes in the neck and axillae were normal RESPIRATORY:  Lungs were clear.  Normal respiratory effort without pathologic use of accessory muscles of respiration CARDIOVASCULAR: Heart was regular without murmurs.  There were no carotid bruits. GI: The abdomen was soft, nontender, and nondistended. There were no palpable masses. There was no hepatosplenomegaly. There were normal bowel sounds in all quadrants. GU:  Rectal deferred.   MUSCULOSKELETAL:  Normal muscle strength and tone.  No clubbing or cyanosis.   SKIN:  There were no pathologic skin lesions.  There were no nodules on palpation. NEUROLOGIC:  Sensation is  normal.  Cranial nerves are grossly intact. PSYCH:  Oriented to person, place and time.  Mood and affect are normal.  Data Reviewed CT scan and PET scan  I have personally reviewed the patient's imaging, laboratory findings and medical records.    Assessment    I have independently reviewed the CT scan and PET scan. The CT and PET show multiple bilateral lung nodules. The most dominant lesion in the right upper lobe is PET positive. In  addition there are some hilar lymph nodes that are both enlarged and PET positive suggestive of at least stage II disease. The smaller nodules are below PET resolution. The other smaller mediastinal lymph nodes are also minimally positive on PET.    Plan    The patient follow-up with Dr. Grayland Ormond today. I did tell him that I thought a biopsy would be indicated given the severe COPD and potential for this to be metastatic disease from his melanoma. Once the biopsies obtained we can then make further recommendations regarding surgical intervention. I did ask the patient to consider tobacco cessation. I told him that this would markedly diminished his risks if he were ever to come to surgery. He understood and we will go ahead and set him up for a CT-guided needle biopsy followed by a follow-up with me.       Nestor Lewandowsky, MD 08/21/2015, 1:54 PM

## 2015-08-22 ENCOUNTER — Other Ambulatory Visit: Payer: Self-pay | Admitting: Oncology

## 2015-08-22 DIAGNOSIS — R918 Other nonspecific abnormal finding of lung field: Secondary | ICD-10-CM

## 2015-08-22 NOTE — Progress Notes (Signed)
[place]  [phone] [phone]) [phone]  ID: REFORD OLLIFF OB: [birth year]  MR#: [id number]  AOZ#:[id number]  Patient Care Team: No Pcp Per Patient as PCP - General (General Practice)  CHIEF COMPLAINT:  No chief complaint on file.   INTERVAL HISTORY: Patient Returns to clinic today for discussion of his imaging results and further diagnostic planning. He continues to have cough but otherwise feels well. He has no neurologic complaints. He admits to unintentional weight loss, but is unclear how much. He denies any fevers. He denies any chest pain or hemoptysis. He denies any nausea, vomiting, constipation, or diarrhea. He has no urinary complaints. Patient offers no further specific complaints.  REVIEW OF SYSTEMS:   Review of Systems  Constitutional: Positive for weight loss. Negative for fever and malaise/fatigue.  Respiratory: Positive for cough and shortness of breath. Negative for hemoptysis.   Cardiovascular: Negative.  Negative for chest pain.  Gastrointestinal: Negative.   Musculoskeletal: Negative.   Neurological: Negative.  Negative for weakness and headaches.    As per HPI. Otherwise, a complete review of systems is negatve.  PAST MEDICAL HISTORY: Past Medical History  Diagnosis Date  . History of melanoma   . History of squamous cell carcinoma     PAST SURGICAL HISTORY: Past Surgical History  Procedure Laterality Date  . Skin graft      [facility]    FAMILY HISTORY: Reviewed and unchanged. No reported history of malignancy or chronic disease.     ADVANCED DIRECTIVES:    HEALTH MAINTENANCE: Social History  Substance Use Topics  . Smoking status: Current Every Day Smoker -- 1.50 packs/day    Types: Cigarettes    Start date: 08/12/1994  . Smokeless tobacco: Never Used  . Alcohol Use: No     Colonoscopy:  PAP:  Bone density:  Lipid panel:  No Known Allergies  No current outpatient prescriptions on file.   No current  facility-administered medications for this visit.    OBJECTIVE: There were no vitals filed for this visit.   There is no weight on file to calculate BMI.    ECOG FS:0 - Asymptomatic  General: Thin, no acute distress. Eyes: Pink conjunctiva, anicteric sclera. Lungs: Clear to auscultation bilaterally. Heart: Regular rate and rhythm. No rubs, murmurs, or gallops. Abdomen: Soft, nontender, nondistended. No organomegaly noted, normoactive bowel sounds. Musculoskeletal: No edema, cyanosis, or clubbing. Neuro: Alert, answering all questions appropriately. Cranial nerves grossly intact. Skin: No rashes or petechiae noted. Psych: Normal affect.  LAB RESULTS:  Lab Results  Component Value Date   NA 137 08/11/2015   K 3.9 08/11/2015   CL 103 08/11/2015   CO2 29 08/11/2015   GLUCOSE 96 08/11/2015   BUN 14 08/11/2015   CREATININE 1.07 08/11/2015   CALCIUM 8.6* 08/11/2015   PROT 6.9 08/11/2015   ALBUMIN 3.8 08/11/2015   AST 15 08/11/2015   ALT 11* 08/11/2015   ALKPHOS 59 08/11/2015   BILITOT 0.5 08/11/2015   GFRNONAA >60 08/11/2015   GFRAA >60 08/11/2015    Lab Results  Component Value Date   WBC 4.0 05/23/2014   HGB 14.3 05/23/2014   HCT 43.6 05/23/2014   MCV 91 05/23/2014   PLT 213 05/23/2014     STUDIES: Dg Chest 2 View  08/11/2015  CLINICAL DATA:  60 year old male with cough congestion and fever for 2 weeks. EXAM: CHEST  2 VIEW COMPARISON:  05/23/2014 and prior exams. FINDINGS: The cardiomediastinal silhouette is unremarkable. COPD/ emphysema noted. A 1.6 cm  right upper lobe nodule is identified -chest CT recommended. There is no evidence of focal airspace disease, pulmonary edema, pleural effusion, or pneumothorax. No acute bony abnormalities are identified. IMPRESSION: 1.6 cm right upper lobe nodule -chest CT recommended. 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 Electronically Signed   By: Margarette Canada M.D.   On: 08/11/2015 10:30   Ct Chest W  Contrast  08/11/2015  CLINICAL DATA:  Cough EXAM: CT CHEST WITH CONTRAST TECHNIQUE: Multidetector CT imaging of the chest was performed during intravenous contrast administration. CONTRAST:  55m OMNIPAQUE IOHEXOL 300 MG/ML  SOLN COMPARISON:  12/05/2005 FINDINGS: There is a 2.6 x 1.7 cm lobulated mass in the right upper lobe worrisome for malignancy. There are several bilateral scattered pulmonary nodules measuring 7 mm or less in diameter. There is an enlarged right hilar node with a short axis diameter of 17 mm. Small mediastinal and left hilar nodes are noted. Severe panlobular emphysema throughout both lungs. No pneumothorax.  No pleural effusion. No destructive bone lesion IMPRESSION: 2.6 cm lobulated right lung mass, bilateral pulmonary nodules, and right hilar adenopathy are worrisome for advanced malignancy. PET-CT is recommended. Electronically Signed   By: AMarybelle KillingsM.D.   On: 08/11/2015 11:44   Nm Pet Image Initial (pi) Skull Base To Thigh  08/21/2015  CLINICAL DATA:  Initial treatment strategy for pulmonary nodule. EXAM: NUCLEAR MEDICINE PET SKULL BASE TO THIGH TECHNIQUE: Twelve point for mCi F-18 FDG was injected intravenously. Full-ring PET imaging was performed from the skull base to thigh after the radiotracer. CT data was obtained and used for attenuation correction and anatomic localization. FASTING BLOOD GLUCOSE:  Value: 75 mg/dl COMPARISON:  Chest CT 08/11/2015. FINDINGS: NECK No hypermetabolic lymph nodes in the neck. CHEST Previously noted right upper lobe pulmonary nodule measures 1.9 x 2.1 cm on today's examination and is hypermetabolic (SUVmax = 9.3). Previously noted right hilar adenopathy is estimated to measure 1.8 cm in short axis on today's examination and is mildly hypermetabolic (SUVmax = 3.2), concerning for nodal metastasis. No additional hypermetabolic enlarged mediastinal or left hilar lymph nodes are noted. A few other scattered sub cm pulmonary nodules appear similar to  the prior examination and demonstrate no hypermetabolism (below the resolution of PET imaging), and are nonspecific. There is also a relatively linear opacity in the right upper lobe posterior to the right upper lobe pulmonary nodule (image 91 of series 3), which is similar to the prior study and demonstrates no hypermetabolism, favored to represent some mucoid impaction within a distal bronchus. No acute consolidative airspace disease. No pleural effusions. There is atherosclerosis of the thoracic aorta, the great vessels of the mediastinum and the coronary arteries, including calcified atherosclerotic plaque in the left main and right coronary arteries. Mild diffuse bronchial wall thickening with mild centrilobular and paraseptal emphysema. ABDOMEN/PELVIS No abnormal hypermetabolic activity within the liver, pancreas, adrenal glands, or spleen. No hypermetabolic lymph nodes in the abdomen or pelvis. Atherosclerosis throughout the abdominal and pelvic vasculature, without evidence of aneurysm. No significant volume of ascites. No pneumoperitoneum. No pathologic distention of small bowel or colon. SKELETON No focal hypermetabolic activity to suggest skeletal metastasis. IMPRESSION: 1. 1.9 x 2.1 cm hypermetabolic right upper lobe pulmonary nodule with hypermetabolic enlarged right hilar lymph nodes, concerning for primary bronchogenic malignancy with right hilar nodal metastasis. The possibility of metastatic disease is not entirely excluded, particularly given the patient's history of melanoma, and the multiple other pulmonary nodules in the lungs bilaterally. Correlation with biopsy is recommended to establish  a tissue diagnosis. 2. Mild diffuse bronchial wall thickening with mild centrilobular and paraseptal emphysema; imaging findings suggestive of underlying COPD. 3. Atherosclerosis, including left main and right coronary artery disease. Please note that although the presence of coronary artery calcium documents  the presence of coronary artery disease, the severity of this disease and any potential stenosis cannot be assessed on this non-gated CT examination. Assessment for potential risk factor modification, dietary therapy or pharmacologic therapy may be warranted, if clinically indicated. Electronically Signed   By: Vinnie Langton M.D.   On: 08/21/2015 09:40    ASSESSMENT: Right upper lobe pulmonary nodule, concerning for underlying malignancy.  PLAN:    1. Pulmonary nodule: PET scan results reviewed independently and reported as above concerning for underlying malignancy. Patient was also evaluated Dr. Genevive Bi determined to not be a surgical candidate. Will get DT guided biopsy in the next 1-2 weeks with follow-up several days later to discuss the results and treatment planning. 2. Weight loss: Likely multifactorial, unclear how much. Monitor. 3. History of melanoma: Patient is a poor historian but appears to have been greater than 3 years ago. Monitor.  Patient expressed understanding and was in agreement with this plan. He also understands that He can call clinic at any time with any questions, concerns, or complaints.   Lloyd Huger, MD   08/22/2015 1:42 PM

## 2015-08-27 ENCOUNTER — Inpatient Hospital Stay: Payer: Medicaid Other

## 2015-08-27 ENCOUNTER — Telehealth: Payer: Self-pay | Admitting: *Deleted

## 2015-08-27 DIAGNOSIS — R918 Other nonspecific abnormal finding of lung field: Secondary | ICD-10-CM

## 2015-08-27 DIAGNOSIS — C3491 Malignant neoplasm of unspecified part of right bronchus or lung: Secondary | ICD-10-CM | POA: Diagnosis not present

## 2015-08-27 LAB — CBC WITH DIFFERENTIAL/PLATELET
Basophils Absolute: 0.1 10*3/uL (ref 0–0.1)
Basophils Relative: 1 %
EOS PCT: 2 %
Eosinophils Absolute: 0.1 10*3/uL (ref 0–0.7)
HCT: 42.9 % (ref 40.0–52.0)
Hemoglobin: 14.5 g/dL (ref 13.0–18.0)
LYMPHS ABS: 2.3 10*3/uL (ref 1.0–3.6)
LYMPHS PCT: 30 %
MCH: 29.9 pg (ref 26.0–34.0)
MCHC: 33.7 g/dL (ref 32.0–36.0)
MCV: 88.6 fL (ref 80.0–100.0)
MONO ABS: 0.8 10*3/uL (ref 0.2–1.0)
Monocytes Relative: 10 %
Neutro Abs: 4.3 10*3/uL (ref 1.4–6.5)
Neutrophils Relative %: 57 %
PLATELETS: 254 10*3/uL (ref 150–440)
RBC: 4.84 MIL/uL (ref 4.40–5.90)
RDW: 14 % (ref 11.5–14.5)
WBC: 7.6 10*3/uL (ref 3.8–10.6)

## 2015-08-27 LAB — PROTIME-INR
INR: 1.01
Prothrombin Time: 13.5 seconds (ref 11.4–15.0)

## 2015-08-27 LAB — APTT: aPTT: 44 seconds — ABNORMAL HIGH (ref 24–36)

## 2015-08-27 NOTE — Telephone Encounter (Signed)
Not sure why he would want to, but that will be fine.

## 2015-08-27 NOTE — Telephone Encounter (Signed)
Asking if he can take Steroids before his CT Scan tomorrow

## 2015-08-27 NOTE — Telephone Encounter (Signed)
Left message on VM that ok to take steroids

## 2015-08-28 ENCOUNTER — Ambulatory Visit
Admission: RE | Admit: 2015-08-28 | Discharge: 2015-08-28 | Disposition: A | Discharge status: Home / Self Care | Payer: Medicaid Other | Source: Ambulatory Visit | Attending: Oncology | Admitting: Oncology

## 2015-08-28 ENCOUNTER — Telehealth: Payer: Self-pay | Admitting: *Deleted

## 2015-08-28 DIAGNOSIS — R911 Solitary pulmonary nodule: Secondary | ICD-10-CM

## 2015-08-28 NOTE — Telephone Encounter (Signed)
Had to reschedule his lung biopsy to Monday because he came in drinking coffee and told her no one told him to be NPO

## 2015-08-28 NOTE — OR Nursing (Signed)
Pt arrived, drinking coffee, told he was not informed that he needed to be NPO. Discussed with Dr Vernard Gambles can do procedure without sedation but did not advise it. Pt and daughter discussed no moderate sedation vs rescheduling. Daughter said father would not be able to tolerate procedure without pain meds father thought he might be able to. In end pt and family decided reschedule plan for Monday January 23rd arrival at 1000.

## 2015-09-01 ENCOUNTER — Ambulatory Visit
Admission: RE | Admit: 2015-09-01 | Discharge: 2015-09-01 | Disposition: A | Discharge status: Home / Self Care | Payer: Medicaid Other | Source: Ambulatory Visit | Attending: Interventional Radiology | Admitting: Interventional Radiology

## 2015-09-01 ENCOUNTER — Ambulatory Visit
Admission: RE | Admit: 2015-09-01 | Discharge: 2015-09-01 | Disposition: A | Discharge status: Home / Self Care | Payer: Medicaid Other | Source: Ambulatory Visit | Attending: Oncology | Admitting: Oncology

## 2015-09-01 DIAGNOSIS — F17200 Nicotine dependence, unspecified, uncomplicated: Secondary | ICD-10-CM | POA: Insufficient documentation

## 2015-09-01 DIAGNOSIS — Z0189 Encounter for other specified special examinations: Secondary | ICD-10-CM | POA: Insufficient documentation

## 2015-09-01 DIAGNOSIS — Z01812 Encounter for preprocedural laboratory examination: Secondary | ICD-10-CM | POA: Diagnosis present

## 2015-09-01 DIAGNOSIS — Z859 Personal history of malignant neoplasm, unspecified: Secondary | ICD-10-CM | POA: Diagnosis not present

## 2015-09-01 DIAGNOSIS — J449 Chronic obstructive pulmonary disease, unspecified: Secondary | ICD-10-CM | POA: Diagnosis not present

## 2015-09-01 DIAGNOSIS — C3491 Malignant neoplasm of unspecified part of right bronchus or lung: Secondary | ICD-10-CM | POA: Insufficient documentation

## 2015-09-01 DIAGNOSIS — Z9889 Other specified postprocedural states: Secondary | ICD-10-CM | POA: Diagnosis not present

## 2015-09-01 DIAGNOSIS — R911 Solitary pulmonary nodule: Secondary | ICD-10-CM | POA: Diagnosis present

## 2015-09-01 HISTORY — DX: Chronic obstructive pulmonary disease, unspecified: J44.9

## 2015-09-01 LAB — CBC WITH DIFFERENTIAL/PLATELET
Basophils Absolute: 0.1 10*3/uL (ref 0–0.1)
Basophils Relative: 1 %
Eosinophils Absolute: 0.1 10*3/uL (ref 0–0.7)
Eosinophils Relative: 2 %
HEMATOCRIT: 41.9 % (ref 40.0–52.0)
HEMOGLOBIN: 14.2 g/dL (ref 13.0–18.0)
LYMPHS ABS: 1.7 10*3/uL (ref 1.0–3.6)
LYMPHS PCT: 31 %
MCH: 29.9 pg (ref 26.0–34.0)
MCHC: 34 g/dL (ref 32.0–36.0)
MCV: 88 fL (ref 80.0–100.0)
Monocytes Absolute: 0.5 10*3/uL (ref 0.2–1.0)
Monocytes Relative: 9 %
NEUTROS ABS: 3.2 10*3/uL (ref 1.4–6.5)
NEUTROS PCT: 57 %
Platelets: 230 10*3/uL (ref 150–440)
RBC: 4.76 MIL/uL (ref 4.40–5.90)
RDW: 13.8 % (ref 11.5–14.5)
WBC: 5.7 10*3/uL (ref 3.8–10.6)

## 2015-09-01 LAB — APTT: aPTT: 39 seconds — ABNORMAL HIGH (ref 24–36)

## 2015-09-01 LAB — PROTIME-INR
INR: 0.95
PROTHROMBIN TIME: 12.9 s (ref 11.4–15.0)

## 2015-09-01 MED ORDER — MIDAZOLAM HCL 5 MG/5ML IJ SOLN
INTRAMUSCULAR | Status: AC
  Filled 2015-09-01: qty 5

## 2015-09-01 MED ORDER — FENTANYL CITRATE (PF) 100 MCG/2ML IJ SOLN
INTRAMUSCULAR | Status: AC
  Filled 2015-09-01: qty 4

## 2015-09-01 MED ORDER — FENTANYL CITRATE (PF) 100 MCG/2ML IJ SOLN
INTRAMUSCULAR | Status: AC | PRN
  Administered 2015-09-01: 50 ug via INTRAVENOUS
  Administered 2015-09-01: 25 ug via INTRAVENOUS

## 2015-09-01 MED ORDER — HYDROCODONE-ACETAMINOPHEN 5-325 MG PO TABS: 1.0000 | ORAL_TABLET | ORAL | Status: DC | PRN

## 2015-09-01 MED ORDER — MIDAZOLAM HCL 5 MG/5ML IJ SOLN
INTRAMUSCULAR | Status: AC | PRN
  Administered 2015-09-01 (×3): 1 mg via INTRAVENOUS

## 2015-09-01 MED ORDER — SODIUM CHLORIDE 0.9 % IV SOLN
INTRAVENOUS | Status: DC
Start: 2015-09-01 — End: 2015-09-02
  Administered 2015-09-01: 10:00:00 via INTRAVENOUS

## 2015-09-01 NOTE — Procedures (Signed)
Interventional Radiology Procedure Note  Procedure: CT guided biopsy of RUL pulmonary nodule Complications: No immediate Recommendations: - Bedrest until CXR cleared.  Minimize talking, coughing or otherwise straining.  - Follow up 2 hr CXR pending   Signed,  Theran Vandergrift K. Dasiah Hooley, MD   

## 2015-09-01 NOTE — Progress Notes (Signed)
Pt remains clinically stable post lung biopsy, sats remain stable as well as vss, afebrile, daUGHTER  Present with discharge instructions given, questlions answered,denies complaints, will get cxr as ordered at 1345

## 2015-09-01 NOTE — Progress Notes (Signed)
Pt doing well post procedure, cxr noting no pneumothorax, checking with Md, Mcc[place], ok to eat and for discharge aT 1445

## 2015-09-01 NOTE — Discharge Instructions (Signed)

## 2015-09-01 NOTE — Progress Notes (Signed)
Pt here post lung biopsy, clinically stable, vss, no coughing nor shortness of breath. Dr Lyman Bishop in to speak with pt with questions answered, pt resting on bedrest,

## 2015-09-02 LAB — SURGICAL PATHOLOGY

## 2015-09-04 ENCOUNTER — Ambulatory Visit: Payer: Medicaid Other | Admitting: Oncology

## 2015-09-08 ENCOUNTER — Inpatient Hospital Stay (HOSPITAL_BASED_OUTPATIENT_CLINIC_OR_DEPARTMENT_OTHER): Payer: Medicaid Other | Admitting: Oncology

## 2015-09-08 ENCOUNTER — Ambulatory Visit
Admission: RE | Admit: 2015-09-08 | Discharge: 2015-09-08 | Disposition: A | Discharge status: Home / Self Care | Payer: Medicaid Other | Source: Ambulatory Visit | Attending: Radiation Oncology | Admitting: Radiation Oncology

## 2015-09-08 ENCOUNTER — Encounter: Payer: Self-pay | Admitting: Radiation Oncology

## 2015-09-08 VITALS — BP 112/66 | HR 76 | Temp 97.0°F | Resp 18 | Wt 137.6 lb

## 2015-09-08 DIAGNOSIS — Z79899 Other long term (current) drug therapy: Secondary | ICD-10-CM

## 2015-09-08 DIAGNOSIS — F17210 Nicotine dependence, cigarettes, uncomplicated: Secondary | ICD-10-CM | POA: Insufficient documentation

## 2015-09-08 DIAGNOSIS — C3491 Malignant neoplasm of unspecified part of right bronchus or lung: Secondary | ICD-10-CM

## 2015-09-08 DIAGNOSIS — C3411 Malignant neoplasm of upper lobe, right bronchus or lung: Secondary | ICD-10-CM | POA: Insufficient documentation

## 2015-09-08 DIAGNOSIS — R05 Cough: Secondary | ICD-10-CM | POA: Diagnosis not present

## 2015-09-08 DIAGNOSIS — Z510 Encounter for antineoplastic radiation therapy: Secondary | ICD-10-CM | POA: Insufficient documentation

## 2015-09-08 DIAGNOSIS — Z85820 Personal history of malignant melanoma of skin: Secondary | ICD-10-CM

## 2015-09-08 DIAGNOSIS — C3490 Malignant neoplasm of unspecified part of unspecified bronchus or lung: Secondary | ICD-10-CM | POA: Insufficient documentation

## 2015-09-08 DIAGNOSIS — R634 Abnormal weight loss: Secondary | ICD-10-CM | POA: Diagnosis not present

## 2015-09-08 NOTE — Progress Notes (Signed)
Patient has right side back pain.

## 2015-09-08 NOTE — Consult Note (Signed)
Except an outstanding is perfect of Radiation Oncology NEW PATIENT EVALUATION  Name: David [name]  MRN: [id number]  Date:   09/08/2015     DOB: 1956/07/06   This 60 y.o. 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 Limited stage small cell lung cancer.  REFERRING PHYSICIAN: No ref. provider found  CHIEF COMPLAINT: No chief complaint on file.   DIAGNOSIS: The encounter diagnosis was Small cell lung cancer, right ([place]).   PREVIOUS INVESTIGATIONS:  CT scans PET/CT scans reviewed MRI of brain has been ordered Pathology report reviewed Clinical notes reviewed  HPI: Patient is a 60 year old male presenting with increasing dyspnea on exertion and cough found on chest x-ray to have a 1.6 cm right upper lobe nodule suspicious for malignancy. He also has significant COPD emphysema. CT scan PET CT scan demonstrated hypermetabolic activity in the right upper lobe with possibility of right hilar nodal involvement. Biopsy was positive for small cell undifferentiated lung cancer. He is pending MRI scan of his brain. He is seen today for consideration of concurrent chemoradiation. He is scheduled for cis-platinum and etoposide. He continues to have chronic cough no hemoptysis. He also has dyspnea on exertion. No change in neurologic status no headaches.  PLANNED TREATMENT REGIMEN: Concurrent chemotherapy and radiation therapy  PAST MEDICAL HISTORY:  has a past medical history of History of melanoma; History of squamous cell carcinoma; COPD (chronic obstructive pulmonary disease) ([place]); and Cancer ([place]).    PAST SURGICAL HISTORY:  Past Surgical History  Procedure Laterality Date  . Skin graft      [facility]    FAMILY HISTORY: family history is not on file.  SOCIAL HISTORY:  reports that he has been smoking Cigarettes.  He started smoking about 21 years ago. He has a 76.5 pack-year smoking history. He has never used  smokeless tobacco. He reports that he does not drink alcohol or use illicit drugs.  ALLERGIES: Review of patient's allergies indicates no known allergies.  MEDICATIONS:  No current outpatient prescriptions on file.   No current facility-administered medications for this encounter.    ECOG PERFORMANCE STATUS:  1 - Symptomatic but completely ambulatory  REVIEW OF SYSTEMS:  Patient denies any weight loss, fatigue, weakness, fever, chills or night sweats. Patient denies any loss of vision, blurred vision. Patient denies any ringing  of the ears or hearing loss. No irregular heartbeat. Patient denies heart murmur or history of fainting. Patient denies any chest pain or pain radiating to her upper extremities. Patient denies any shortness of breath, difficulty breathing at night, cough or hemoptysis. Patient denies any swelling in the lower legs. Patient denies any nausea vomiting, vomiting of blood, or coffee ground material in the vomitus. Patient denies any stomach pain. Patient states has had normal bowel movements no significant constipation or diarrhea. Patient denies any dysuria, hematuria or significant nocturia. Patient denies any problems walking, swelling in the joints or loss of balance. Patient denies any skin changes, loss of hair or loss of weight. Patient denies any excessive worrying or anxiety or significant depression. Patient denies any problems with insomnia. Patient denies excessive thirst, polyuria, polydipsia. Patient denies any swollen glands, patient denies easy bruising or easy bleeding. Patient denies any recent infections, allergies or URI. Patient "s visual fields have not changed significantly in recent time.    PHYSICAL EXAM: There were no vitals taken for this visit. Well-developed well-nourished patient in NAD.  HEENT reveals PERLA, EOMI, discs not visualized.  Oral cavity is clear. No oral mucosal lesions are identified. Neck is clear without evidence of cervical or  supraclavicular adenopathy. Lungs are clear to A&P. Cardiac examination is essentially unremarkable with regular rate and rhythm without murmur rub or thrill. Abdomen is benign with no organomegaly or masses noted. Motor sensory and DTR levels are equal and symmetric in the upper and lower extremities. Cranial nerves II through XII are grossly intact. Proprioception is intact. No peripheral adenopathy or edema is identified. No motor or sensory levels are noted. Crude visual fields are within normal range. No evidence of cervical or supra clavicular adenopathy is noted.  LABORATORY DATA: Pathology report reviewed    RADIOLOGY RESULTS: CT scans and PET/CT scan reviewed   IMPRESSION: Stage IIIa small cell and differential lung cancer of the right upper lobe in 60 year old male for concurrent chemoradiation with curative intent  PLAN: At this time I to go ahead and plan radiation treatment fields with concurrent chemotherapy for this patient. I would plan on delivering 6000 cGy over 6 weeks the primary tumor involvement and right hilar lymph nodes which are hypermetabolic on PET CT scan. I believe I am RT treatment planning and delivery would best serve my needs of sparing as much normal lung volume in this patient who has significant COPD emphysema. We'll also bring the dose off of the heart and esophagus and we will demonstrate this at the time of simulation and comparative treatment plans with three-dimensional treatment planning. Risks and benefits of treatment including possible radiation esophagitis fatigue skin changes alteration of blood counts increasing cough and damage to normal lung volume all were discussed in detail with the patient and his family. I have set up and ordered CT simulation later this week. We'll coordinate his chemotherapy. Also will review his MRI scan prior to initiating treatment to make sure we are not dealing with stage IV disease. Should his brain be normal also would  recommend a whole brain radiation therapy after completion of all treatment if he develops a complete response to prevent brain failure down the road. All this was explained again to the patient and his family.  I would like to take this opportunity for allowing me to participate in the care of your patient.Armstead Peaks., MD

## 2015-09-08 NOTE — Progress Notes (Signed)
[place]  [phone] [phone]) [phone]  ID: David [name] OB: 1956-08-02  MR#: [id number]  XTG#:[id number]  Patient Care Team: No Pcp Per Patient as PCP - General (General Practice)  CHIEF COMPLAINT:  Chief Complaint  Patient presents with  . Results    INTERVAL HISTORY: Patient returns to clinic today to discuss his pathology results and treatment planning.  He continues to have cough but otherwise feels well. He has no neurologic complaints. He does not complain of any further weight loss. He denies any fevers. He denies any chest pain or hemoptysis. He denies any nausea, vomiting, constipation, or diarrhea. He has no urinary complaints. Patient offers no further specific complaints.  REVIEW OF SYSTEMS:   Review of Systems  Constitutional: Positive for weight loss. Negative for fever and malaise/fatigue.  Respiratory: Positive for cough and shortness of breath. Negative for hemoptysis.   Cardiovascular: Negative.  Negative for chest pain.  Gastrointestinal: Negative.   Musculoskeletal: Negative.   Neurological: Negative.  Negative for weakness and headaches.    As per HPI. Otherwise, a complete review of systems is negatve.  PAST MEDICAL HISTORY: Past Medical History  Diagnosis Date  . History of melanoma   . History of squamous cell carcinoma   . COPD (chronic obstructive pulmonary disease) ([place])   . Cancer [facility])     PAST SURGICAL HISTORY: Past Surgical History  Procedure Laterality Date  . Skin graft      [facility]    FAMILY HISTORY: Reviewed and unchanged. No reported history of malignancy or chronic disease.     ADVANCED DIRECTIVES:    HEALTH MAINTENANCE: Social History  Substance Use Topics  . Smoking status: Current Every Day Smoker -- 1.50 packs/day for 51 years    Types: Cigarettes    Start date: 08/12/1994  . Smokeless tobacco: Never Used  . Alcohol Use: No     Colonoscopy:  PAP:  Bone density:  Lipid  panel:  No Known Allergies  No current outpatient prescriptions on file.   No current facility-administered medications for this visit.    OBJECTIVE: Filed Vitals:   09/08/15 1006  BP: 112/66  Pulse: 76  Temp: 97 F (36.1 C)  Resp: 18     Body mass index is 20.31 kg/(m^2).    ECOG FS:0 - Asymptomatic  General: Thin, no acute distress. Eyes: Pink conjunctiva, anicteric sclera. Lungs: Clear to auscultation bilaterally. Heart: Regular rate and rhythm. No rubs, murmurs, or gallops. Abdomen: Soft, nontender, nondistended. No organomegaly noted, normoactive bowel sounds. Musculoskeletal: No edema, cyanosis, or clubbing. Neuro: Alert, answering all questions appropriately. Cranial nerves grossly intact. Skin: No rashes or petechiae noted. Psych: Normal affect.  LAB RESULTS:  Lab Results  Component Value Date   NA 137 08/11/2015   K 3.9 08/11/2015   CL 103 08/11/2015   CO2 29 08/11/2015   GLUCOSE 96 08/11/2015   BUN 14 08/11/2015   CREATININE 1.07 08/11/2015   CALCIUM 8.6* 08/11/2015   PROT 6.9 08/11/2015   ALBUMIN 3.8 08/11/2015   AST 15 08/11/2015   ALT 11* 08/11/2015   ALKPHOS 59 08/11/2015   BILITOT 0.5 08/11/2015   GFRNONAA >60 08/11/2015   GFRAA >60 08/11/2015    Lab Results  Component Value Date   WBC 5.7 09/01/2015   NEUTROABS 3.2 09/01/2015   HGB 14.2 09/01/2015   HCT 41.9 09/01/2015   MCV 88.0 09/01/2015   PLT 230 09/01/2015     STUDIES: Dg Chest 1 View  09/01/2015  CLINICAL DATA:  Status post biopsy EXAM: CHEST 1 VIEW COMPARISON:  08/11/2015 FINDINGS: Status post biopsy of a right upper lobe pulmonary nodule. There is no other pulmonary opacity. There is no pleural effusion or pneumothorax. The heart and mediastinal contours are unremarkable. The osseous structures are unremarkable. IMPRESSION: No right pneumothorax status post percutaneous biopsy of a right upper lobe pulmonary nodule. Electronically Signed   By: Kathreen Devoid   On: 09/01/2015 13:54    Dg Chest 2 View  08/11/2015  CLINICAL DATA:  60 year old male with cough congestion and fever for 2 weeks. EXAM: CHEST  2 VIEW COMPARISON:  05/23/2014 and prior exams. FINDINGS: The cardiomediastinal silhouette is unremarkable. COPD/ emphysema noted. A 1.6 cm right upper lobe nodule is identified -chest CT recommended. There is no evidence of focal airspace disease, pulmonary edema, pleural effusion, or pneumothorax. No acute bony abnormalities are identified. IMPRESSION: 1.6 cm right upper lobe nodule -chest CT recommended. 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 Electronically Signed   By: Margarette Canada M.D.   On: 08/11/2015 10:30   Ct Chest W Contrast  08/11/2015  CLINICAL DATA:  Cough EXAM: CT CHEST WITH CONTRAST TECHNIQUE: Multidetector CT imaging of the chest was performed during intravenous contrast administration. CONTRAST:  44m OMNIPAQUE IOHEXOL 300 MG/ML  SOLN COMPARISON:  12/05/2005 FINDINGS: There is a 2.6 x 1.7 cm lobulated mass in the right upper lobe worrisome for malignancy. There are several bilateral scattered pulmonary nodules measuring 7 mm or less in diameter. There is an enlarged right hilar node with a short axis diameter of 17 mm. Small mediastinal and left hilar nodes are noted. Severe panlobular emphysema throughout both lungs. No pneumothorax.  No pleural effusion. No destructive bone lesion IMPRESSION: 2.6 cm lobulated right lung mass, bilateral pulmonary nodules, and right hilar adenopathy are worrisome for advanced malignancy. PET-CT is recommended. Electronically Signed   By: AMarybelle KillingsM.D.   On: 08/11/2015 11:44   Nm Pet Image Initial (pi) Skull Base To Thigh  08/21/2015  CLINICAL DATA:  Initial treatment strategy for pulmonary nodule. EXAM: NUCLEAR MEDICINE PET SKULL BASE TO THIGH TECHNIQUE: Twelve point for mCi F-18 FDG was injected intravenously. Full-ring PET imaging was performed from the skull base to thigh after the radiotracer. CT data was obtained and  used for attenuation correction and anatomic localization. FASTING BLOOD GLUCOSE:  Value: 75 mg/dl COMPARISON:  Chest CT 08/11/2015. FINDINGS: NECK No hypermetabolic lymph nodes in the neck. CHEST Previously noted right upper lobe pulmonary nodule measures 1.9 x 2.1 cm on today's examination and is hypermetabolic (SUVmax = 9.3). Previously noted right hilar adenopathy is estimated to measure 1.8 cm in short axis on today's examination and is mildly hypermetabolic (SUVmax = 3.2), concerning for nodal metastasis. No additional hypermetabolic enlarged mediastinal or left hilar lymph nodes are noted. A few other scattered sub cm pulmonary nodules appear similar to the prior examination and demonstrate no hypermetabolism (below the resolution of PET imaging), and are nonspecific. There is also a relatively linear opacity in the right upper lobe posterior to the right upper lobe pulmonary nodule (image 91 of series 3), which is similar to the prior study and demonstrates no hypermetabolism, favored to represent some mucoid impaction within a distal bronchus. No acute consolidative airspace disease. No pleural effusions. There is atherosclerosis of the thoracic aorta, the great vessels of the mediastinum and the coronary arteries, including calcified atherosclerotic plaque in the left main and right coronary arteries. Mild diffuse bronchial wall thickening with mild centrilobular  and paraseptal emphysema. ABDOMEN/PELVIS No abnormal hypermetabolic activity within the liver, pancreas, adrenal glands, or spleen. No hypermetabolic lymph nodes in the abdomen or pelvis. Atherosclerosis throughout the abdominal and pelvic vasculature, without evidence of aneurysm. No significant volume of ascites. No pneumoperitoneum. No pathologic distention of small bowel or colon. SKELETON No focal hypermetabolic activity to suggest skeletal metastasis. IMPRESSION: 1. 1.9 x 2.1 cm hypermetabolic right upper lobe pulmonary nodule with  hypermetabolic enlarged right hilar lymph nodes, concerning for primary bronchogenic malignancy with right hilar nodal metastasis. The possibility of metastatic disease is not entirely excluded, particularly given the patient's history of melanoma, and the multiple other pulmonary nodules in the lungs bilaterally. Correlation with biopsy is recommended to establish a tissue diagnosis. 2. Mild diffuse bronchial wall thickening with mild centrilobular and paraseptal emphysema; imaging findings suggestive of underlying COPD. 3. Atherosclerosis, including left main and right coronary artery disease. Please note that although the presence of coronary artery calcium documents the presence of coronary artery disease, the severity of this disease and any potential stenosis cannot be assessed on this non-gated CT examination. Assessment for potential risk factor modification, dietary therapy or pharmacologic therapy may be warranted, if clinically indicated. Electronically Signed   By: Vinnie Langton M.D.   On: 08/21/2015 09:40   Ct Biopsy  09/01/2015  INDICATION: 60 year old male smoker with a hypermetabolic right upper lobe pulmonary nodule concerning for primary bronchogenic carcinoma. EXAM: CT-guided biopsy right upper lobe lobe pulmonary nodule Interventional Radiologist:  Criselda Peaches, MD MEDICATIONS: None. ANESTHESIA/SEDATION: Fentanyl 75 mcg IV; Versed 3 mg IV Moderate Sedation Time:  20 The patient was continuously monitored during the procedure by the interventional radiology nurse under my direct supervision. FLUOROSCOPY TIME:  None COMPLICATIONS: None immediate. Estimated blood loss:  0 PROCEDURE: Informed written consent was obtained from the patient after a thorough discussion of the procedural risks, benefits and alternatives. All questions were addressed. Maximal Sterile Barrier Technique was utilized including caps, mask, sterile gowns, sterile gloves, sterile drape, hand hygiene and skin  antiseptic. A timeout was performed prior to the initiation of the procedure. A planning axial CT scan was performed. The nodule in the right upper lobe was successfully identified. A suitable skin entry site was selected and marked. The region was then sterilely prepped and draped in standard fashion using Betadine skin prep. Local anesthesia was attained by infiltration with 1% lidocaine. A small dermatotomy was made. Under intermittent CT fluoroscopic guidance, a 17 gauge trocar needle was advanced into the lung and positioned at the margin of the nodule. Multiple 18 gauge core biopsies were then coaxially obtained using the BioPince automated biopsy device. Biopsy specimens were placed in formalin and delivered to pathology for further analysis. A bio sentry device was successfully deployed. Post biopsy axial CT imaging demonstrates no evidence of immediate complication. There is no pneumothorax. Mild perilesional alveolar hemorrhage is not unexpected. The patient tolerated the procedure well. IMPRESSION: Technically successful CT-guided biopsy right upper lobe pulmonary nodule. Signed, Criselda Peaches, MD [facility] Electronically Signed   By: Jacqulynn Cadet M.D.   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  PLAN:    1. Small cell lung cancer: PET scan and pathology results reviewed independently. Patient will require MRI of the brain to complete staging workup. Will get a port placement in the next 1-2 weeks as well as a radiation oncology consult later today. Patient will return  to clinic in 2 weeks to initiate cycle 1 of 4 of cisplatin and etoposide. He will receive cisplatin and etoposide on day 1 with etoposide only on days 2 and 3. He will receive treatment every 3 weeks. Patient will also receive concurrent XRT. At the conclusion of his treatment, he will also require PCI.  2. Weight  loss: Likely multifactorial, unclear how much. Monitor. 3. History of melanoma: Patient is a poor historian but appears to have been greater than 3 years ago. Monitor.  Patient expressed understanding and was in agreement with this plan. He also understands that He can call clinic at any time with any questions, concerns, or complaints.   Lloyd Huger, MD   09/08/2015 11:00 AM

## 2015-09-09 ENCOUNTER — Other Ambulatory Visit: Payer: Self-pay | Admitting: Vascular Surgery

## 2015-09-09 NOTE — Patient Instructions (Signed)
Cisplatin injection What is this medicine? CISPLATIN (SIS pla tin) is a chemotherapy drug. It targets fast dividing cells, like cancer cells, and causes these cells to die. This medicine is used to treat many types of cancer like bladder, ovarian, and testicular cancers. This medicine may be used for other purposes; ask your health care provider or pharmacist if you have questions. What should I tell my health care provider before I take this medicine? They need to know if you have any of these conditions: -blood disorders -hearing problems -kidney disease -recent or ongoing radiation therapy -an unusual or allergic reaction to cisplatin, carboplatin, other chemotherapy, other medicines, foods, dyes, or preservatives -pregnant or trying to get pregnant -breast-feeding How should I use this medicine? This drug is given as an infusion into a vein. It is administered in a hospital or clinic by a specially trained health care professional. Talk to your pediatrician regarding the use of this medicine in children. Special care may be needed. Overdosage: If you think you have taken too much of this medicine contact a poison control center or emergency room at once. NOTE: This medicine is only for you. Do not share this medicine with others. What if I miss a dose? It is important not to miss a dose. Call your doctor or health care professional if you are unable to keep an appointment. What may interact with this medicine? -dofetilide -foscarnet -medicines for seizures -medicines to increase blood counts like filgrastim, pegfilgrastim, sargramostim -probenecid -pyridoxine used with altretamine -rituximab -some antibiotics like amikacin, gentamicin, neomycin, polymyxin B, streptomycin, tobramycin -sulfinpyrazone -vaccines -zalcitabine Talk to your doctor or health care professional before taking any of these medicines: -acetaminophen -aspirin -ibuprofen -ketoprofen -naproxen This list may  not describe all possible interactions. Give your health care provider a list of all the medicines, herbs, non-prescription drugs, or dietary supplements you use. Also tell them if you smoke, drink alcohol, or use illegal drugs. Some items may interact with your medicine. What should I watch for while using this medicine? Your condition will be monitored carefully while you are receiving this medicine. You will need important blood work done while you are taking this medicine. This drug may make you feel generally unwell. This is not uncommon, as chemotherapy can affect healthy cells as well as cancer cells. Report any side effects. Continue your course of treatment even though you feel ill unless your doctor tells you to stop. In some cases, you may be given additional medicines to help with side effects. Follow all directions for their use. Call your doctor or health care professional for advice if you get a fever, chills or sore throat, or other symptoms of a cold or flu. Do not treat yourself. This drug decreases your body's ability to fight infections. Try to avoid being around people who are sick. This medicine may increase your risk to bruise or bleed. Call your doctor or health care professional if you notice any unusual bleeding. Be careful brushing and flossing your teeth or using a toothpick because you may get an infection or bleed more easily. If you have any dental work done, tell your dentist you are receiving this medicine. Avoid taking products that contain aspirin, acetaminophen, ibuprofen, naproxen, or ketoprofen unless instructed by your doctor. These medicines may hide a fever. Do not become pregnant while taking this medicine. Women should inform their doctor if they wish to become pregnant or think they might be pregnant. There is a potential for serious side effects to   an unborn child. Talk to your health care professional or pharmacist for more information. Do not breast-feed an  infant while taking this medicine. Drink fluids as directed while you are taking this medicine. This will help protect your kidneys. Call your doctor or health care professional if you get diarrhea. Do not treat yourself. What side effects may I notice from receiving this medicine? Side effects that you should report to your doctor or health care professional as soon as possible: -allergic reactions like skin rash, itching or hives, swelling of the face, lips, or tongue -signs of infection - fever or chills, cough, sore throat, pain or difficulty passing urine -signs of decreased platelets or bleeding - bruising, pinpoint red spots on the skin, black, tarry stools, nosebleeds -signs of decreased red blood cells - unusually weak or tired, fainting spells, lightheadedness -breathing problems -changes in hearing -gout pain -low blood counts - This drug may decrease the number of white blood cells, red blood cells and platelets. You may be at increased risk for infections and bleeding. -nausea and vomiting -pain, swelling, redness or irritation at the injection site -pain, tingling, numbness in the hands or feet -problems with balance, movement -trouble passing urine or change in the amount of urine Side effects that usually do not require medical attention (report to your doctor or health care professional if they continue or are bothersome): -changes in vision -loss of appetite -metallic taste in the mouth or changes in taste This list may not describe all possible side effects. Call your doctor for medical advice about side effects. You may report side effects to FDA at 1-800-FDA-1088. Where should I keep my medicine? This drug is given in a hospital or clinic and will not be stored at home. NOTE: This sheet is a summary. It may not cover all possible information. If you have questions about this medicine, talk to your doctor, pharmacist, or health care provider.    2016, Elsevier/Gold  Standard. (2007-10-31 14:40:54) Etoposide, VP-16 injection What is this medicine? ETOPOSIDE, VP-16 (e toe POE side) is a chemotherapy drug. It is used to treat testicular cancer, lung cancer, and other cancers. This medicine may be used for other purposes; ask your health care provider or pharmacist if you have questions. What should I tell my health care provider before I take this medicine? They need to know if you have any of these conditions: -infection -kidney disease -low blood counts, like low white cell, platelet, or red cell counts -an unusual or allergic reaction to etoposide, other chemotherapeutic agents, other medicines, foods, dyes, or preservatives -pregnant or trying to get pregnant -breast-feeding How should I use this medicine? This medicine is for infusion into a vein. It is administered in a hospital or clinic by a specially trained health care professional. Talk to your pediatrician regarding the use of this medicine in children. Special care may be needed. Overdosage: If you think you have taken too much of this medicine contact a poison control center or emergency room at once. NOTE: This medicine is only for you. Do not share this medicine with others. What if I miss a dose? It is important not to miss your dose. Call your doctor or health care professional if you are unable to keep an appointment. What may interact with this medicine? -aspirin -certain medications for seizures like carbamazepine, phenobarbital, phenytoin, valproic acid -cyclosporine -levamisole -warfarin This list may not describe all possible interactions. Give your health care provider a list of all the medicines, herbs,  non-prescription drugs, or dietary supplements you use. Also tell them if you smoke, drink alcohol, or use illegal drugs. Some items may interact with your medicine. What should I watch for while using this medicine? Visit your doctor for checks on your progress. This drug may  make you feel generally unwell. This is not uncommon, as chemotherapy can affect healthy cells as well as cancer cells. Report any side effects. Continue your course of treatment even though you feel ill unless your doctor tells you to stop. In some cases, you may be given additional medicines to help with side effects. Follow all directions for their use. Call your doctor or health care professional for advice if you get a fever, chills or sore throat, or other symptoms of a cold or flu. Do not treat yourself. This drug decreases your body's ability to fight infections. Try to avoid being around people who are sick. This medicine may increase your risk to bruise or bleed. Call your doctor or health care professional if you notice any unusual bleeding. Be careful brushing and flossing your teeth or using a toothpick because you may get an infection or bleed more easily. If you have any dental work done, tell your dentist you are receiving this medicine. Avoid taking products that contain aspirin, acetaminophen, ibuprofen, naproxen, or ketoprofen unless instructed by your doctor. These medicines may hide a fever. Do not become pregnant while taking this medicine or for at least 6 months after stopping it. Women should inform their doctor if they wish to become pregnant or think they might be pregnant. Women of child-bearing potential will need to have a negative pregnancy test before starting this medicine. There is a potential for serious side effects to an unborn child. Talk to your health care professional or pharmacist for more information. Do not breast-feed an infant while taking this medicine. Men must use a latex condom during sexual contact with a woman while taking this medicine and for at least 4 months after stopping it. A latex condom is needed even if you have had a vasectomy. Contact your doctor right away if your partner becomes pregnant. Do not donate sperm while taking this medicine and for  at least 4 months after you stop taking this medicine. Men should inform their doctors if they wish to father a child. This medicine may lower sperm counts. What side effects may I notice from receiving this medicine? Side effects that you should report to your doctor or health care professional as soon as possible: -allergic reactions like skin rash, itching or hives, swelling of the face, lips, or tongue -low blood counts - this medicine may decrease the number of white blood cells, red blood cells and platelets. You may be at increased risk for infections and bleeding. -signs of infection - fever or chills, cough, sore throat, pain or difficulty passing urine -signs of decreased platelets or bleeding - bruising, pinpoint red spots on the skin, black, tarry stools, blood in the urine -signs of decreased red blood cells - unusually weak or tired, fainting spells, lightheadedness -breathing problems -changes in vision -mouth or throat sores or ulcers -pain, redness, swelling or irritation at the injection site -pain, tingling, numbness in the hands or feet -redness, blistering, peeling or loosening of the skin, including inside the mouth -seizures -vomiting Side effects that usually do not require medical attention (report to your doctor or health care professional if they continue or are bothersome): -diarrhea -hair loss -loss of appetite -nausea -stomach pain  This list may not describe all possible side effects. Call your doctor for medical advice about side effects. You may report side effects to FDA at 1-800-FDA-1088. Where should I keep my medicine? This drug is given in a hospital or clinic and will not be stored at home. NOTE: This sheet is a summary. It may not cover all possible information. If you have questions about this medicine, talk to your doctor, pharmacist, or health care provider.    2016, [employer]vier/Gold Standard. (2014-03-21 12:32:50)

## 2015-09-10 ENCOUNTER — Ambulatory Visit
Admission: RE | Admit: 2015-09-10 | Discharge: 2015-09-10 | Disposition: A | Discharge status: Home / Self Care | Payer: Medicaid Other | Source: Ambulatory Visit | Attending: Radiation Oncology | Admitting: Radiation Oncology

## 2015-09-10 DIAGNOSIS — Z510 Encounter for antineoplastic radiation therapy: Secondary | ICD-10-CM | POA: Diagnosis present

## 2015-09-10 DIAGNOSIS — F17210 Nicotine dependence, cigarettes, uncomplicated: Secondary | ICD-10-CM | POA: Diagnosis not present

## 2015-09-10 DIAGNOSIS — C3411 Malignant neoplasm of upper lobe, right bronchus or lung: Secondary | ICD-10-CM | POA: Diagnosis not present

## 2015-09-11 ENCOUNTER — Inpatient Hospital Stay: Payer: Medicaid Other | Attending: Oncology

## 2015-09-11 DIAGNOSIS — J449 Chronic obstructive pulmonary disease, unspecified: Secondary | ICD-10-CM | POA: Insufficient documentation

## 2015-09-11 DIAGNOSIS — R634 Abnormal weight loss: Secondary | ICD-10-CM | POA: Insufficient documentation

## 2015-09-11 DIAGNOSIS — Z5111 Encounter for antineoplastic chemotherapy: Secondary | ICD-10-CM | POA: Insufficient documentation

## 2015-09-11 DIAGNOSIS — M109 Gout, unspecified: Secondary | ICD-10-CM | POA: Insufficient documentation

## 2015-09-11 DIAGNOSIS — C3411 Malignant neoplasm of upper lobe, right bronchus or lung: Secondary | ICD-10-CM | POA: Insufficient documentation

## 2015-09-11 DIAGNOSIS — Z79899 Other long term (current) drug therapy: Secondary | ICD-10-CM | POA: Insufficient documentation

## 2015-09-11 DIAGNOSIS — R05 Cough: Secondary | ICD-10-CM | POA: Insufficient documentation

## 2015-09-11 DIAGNOSIS — Z85820 Personal history of malignant melanoma of skin: Secondary | ICD-10-CM | POA: Insufficient documentation

## 2015-09-11 DIAGNOSIS — F17210 Nicotine dependence, cigarettes, uncomplicated: Secondary | ICD-10-CM | POA: Insufficient documentation

## 2015-09-15 ENCOUNTER — Ambulatory Visit
Admission: RE | Admit: 2015-09-15 | Discharge: 2015-09-15 | Disposition: A | Discharge status: Home / Self Care | Payer: Medicaid Other | Source: Ambulatory Visit | Attending: Vascular Surgery | Admitting: Vascular Surgery

## 2015-09-15 ENCOUNTER — Encounter: Payer: Self-pay | Admitting: Emergency Medicine

## 2015-09-15 ENCOUNTER — Encounter: Payer: Self-pay | Admitting: *Deleted

## 2015-09-15 ENCOUNTER — Telehealth: Payer: Self-pay | Admitting: *Deleted

## 2015-09-15 ENCOUNTER — Emergency Department
Admission: EM | Admit: 2015-09-15 | Discharge: 2015-09-15 | Disposition: A | Discharge status: Home / Self Care | Payer: Medicaid Other | Attending: Emergency Medicine | Admitting: Emergency Medicine

## 2015-09-15 ENCOUNTER — Other Ambulatory Visit: Payer: Self-pay | Admitting: Oncology

## 2015-09-15 ENCOUNTER — Encounter
Admission: RE | Disposition: A | Discharge status: Home / Self Care | Payer: Self-pay | Source: Ambulatory Visit | Attending: Vascular Surgery

## 2015-09-15 DIAGNOSIS — F17210 Nicotine dependence, cigarettes, uncomplicated: Secondary | ICD-10-CM | POA: Insufficient documentation

## 2015-09-15 DIAGNOSIS — R05 Cough: Secondary | ICD-10-CM | POA: Insufficient documentation

## 2015-09-15 DIAGNOSIS — C3490 Malignant neoplasm of unspecified part of unspecified bronchus or lung: Secondary | ICD-10-CM | POA: Insufficient documentation

## 2015-09-15 DIAGNOSIS — M542 Cervicalgia: Secondary | ICD-10-CM | POA: Insufficient documentation

## 2015-09-15 DIAGNOSIS — Z79899 Other long term (current) drug therapy: Secondary | ICD-10-CM | POA: Diagnosis not present

## 2015-09-15 DIAGNOSIS — C3491 Malignant neoplasm of unspecified part of right bronchus or lung: Secondary | ICD-10-CM

## 2015-09-15 DIAGNOSIS — F419 Anxiety disorder, unspecified: Secondary | ICD-10-CM | POA: Diagnosis not present

## 2015-09-15 DIAGNOSIS — Z87891 Personal history of nicotine dependence: Secondary | ICD-10-CM | POA: Insufficient documentation

## 2015-09-15 DIAGNOSIS — G8918 Other acute postprocedural pain: Secondary | ICD-10-CM | POA: Insufficient documentation

## 2015-09-15 DIAGNOSIS — Z9889 Other specified postprocedural states: Secondary | ICD-10-CM | POA: Insufficient documentation

## 2015-09-15 HISTORY — PX: PERIPHERAL VASCULAR CATHETERIZATION: SHX172C

## 2015-09-15 SURGERY — PORTA CATH INSERTION
Anesthesia: Moderate Sedation

## 2015-09-15 MED ORDER — PHENOL 1.4 % MT LIQD: 1.0000 | OROMUCOSAL | Status: DC | PRN

## 2015-09-15 MED ORDER — METOPROLOL TARTRATE 1 MG/ML IV SOLN: 2.0000 mg | INTRAVENOUS | Status: DC | PRN

## 2015-09-15 MED ORDER — ACETAMINOPHEN 325 MG RE SUPP
325.0000 mg | RECTAL | Status: DC | PRN
  Filled 2015-09-15: qty 2

## 2015-09-15 MED ORDER — LIDOCAINE-EPINEPHRINE (PF) 1 %-1:200000 IJ SOLN
INTRAMUSCULAR | Status: DC | PRN
  Administered 2015-09-15: 10 mL via INTRADERMAL

## 2015-09-15 MED ORDER — ALUM & MAG HYDROXIDE-SIMETH 200-200-20 MG/5ML PO SUSP
15.0000 mL | ORAL | Status: DC | PRN
Start: 2015-09-15 — End: 2015-09-15

## 2015-09-15 MED ORDER — ONDANSETRON HCL 4 MG/2ML IJ SOLN: 4.0000 mg | Freq: Four times a day (QID) | INTRAMUSCULAR | Status: DC | PRN

## 2015-09-15 MED ORDER — MORPHINE SULFATE (PF) 4 MG/ML IV SOLN
4.0000 mg | Freq: Once | INTRAVENOUS | Status: AC
  Administered 2015-09-15: 4 mg via INTRAMUSCULAR
  Filled 2015-09-15: qty 1

## 2015-09-15 MED ORDER — MORPHINE SULFATE (PF) 4 MG/ML IV SOLN: 2.0000 mg | INTRAVENOUS | Status: DC | PRN

## 2015-09-15 MED ORDER — GUAIFENESIN-DM 100-10 MG/5ML PO SYRP
15.0000 mL | ORAL_SOLUTION | ORAL | Status: DC | PRN
  Filled 2015-09-15: qty 15

## 2015-09-15 MED ORDER — SODIUM CHLORIDE 0.9 % IR SOLN
Freq: Once | Status: AC
  Administered 2015-09-15: 14:00:00
  Filled 2015-09-15: qty 2

## 2015-09-15 MED ORDER — HYDROCODONE-ACETAMINOPHEN 5-325 MG PO TABS: 1.0000 | ORAL_TABLET | ORAL | Status: DC | PRN

## 2015-09-15 MED ORDER — HEPARIN (PORCINE) IN NACL 2-0.9 UNIT/ML-% IJ SOLN
INTRAMUSCULAR | Status: AC
  Filled 2015-09-15: qty 500

## 2015-09-15 MED ORDER — ACETAMINOPHEN 325 MG PO TABS: 325.0000 mg | ORAL_TABLET | ORAL | Status: DC | PRN

## 2015-09-15 MED ORDER — ONDANSETRON HCL 8 MG PO TABS: 8.0000 mg | ORAL_TABLET | Freq: Two times a day (BID) | ORAL | Status: DC | PRN

## 2015-09-15 MED ORDER — SODIUM CHLORIDE 0.9 % IV SOLN
INTRAVENOUS | Status: DC
  Administered 2015-09-15 (×2): via INTRAVENOUS

## 2015-09-15 MED ORDER — PROCHLORPERAZINE MALEATE 10 MG PO TABS: 10.0000 mg | ORAL_TABLET | Freq: Four times a day (QID) | ORAL | Status: DC | PRN

## 2015-09-15 MED ORDER — MIDAZOLAM HCL 5 MG/5ML IJ SOLN
INTRAMUSCULAR | Status: AC
  Filled 2015-09-15: qty 5

## 2015-09-15 MED ORDER — FENTANYL CITRATE (PF) 100 MCG/2ML IJ SOLN
INTRAMUSCULAR | Status: AC
  Filled 2015-09-15: qty 2

## 2015-09-15 MED ORDER — DEXTROSE 5 % IV SOLN
1.5000 g | INTRAVENOUS | Status: AC
  Administered 2015-09-15: 1.5 g via INTRAVENOUS

## 2015-09-15 MED ORDER — HYDRALAZINE HCL 20 MG/ML IJ SOLN: 5.0000 mg | INTRAMUSCULAR | Status: DC | PRN

## 2015-09-15 MED ORDER — OXYCODONE-ACETAMINOPHEN 5-325 MG PO TABS: 1.0000 | ORAL_TABLET | ORAL | Status: DC | PRN

## 2015-09-15 MED ORDER — HYDROMORPHONE HCL 1 MG/ML IJ SOLN: 1.0000 mg | Freq: Once | INTRAMUSCULAR | Status: DC

## 2015-09-15 MED ORDER — LABETALOL HCL 5 MG/ML IV SOLN: 10.0000 mg | INTRAVENOUS | Status: DC | PRN

## 2015-09-15 MED ORDER — LIDOCAINE-PRILOCAINE 2.5-2.5 % EX CREA: TOPICAL_CREAM | CUTANEOUS | Status: DC

## 2015-09-15 MED ORDER — LIDOCAINE-EPINEPHRINE (PF) 1 %-1:200000 IJ SOLN
INTRAMUSCULAR | Status: AC
  Filled 2015-09-15: qty 30

## 2015-09-15 MED ORDER — ONDANSETRON 4 MG PO TBDP
4.0000 mg | ORAL_TABLET | Freq: Once | ORAL | Status: AC
  Administered 2015-09-15: 4 mg via ORAL
  Filled 2015-09-15: qty 1

## 2015-09-15 SURGICAL SUPPLY — 10 items
BAG DECANTER STRL (MISCELLANEOUS) ×2 IMPLANT
KIT PORT POWER 8FR ISP CVUE (Catheter) ×2 IMPLANT
PACK ANGIOGRAPHY (CUSTOM PROCEDURE TRAY) ×2 IMPLANT
PAD GROUND ADULT SPLIT (MISCELLANEOUS) ×2 IMPLANT
PENCIL ELECTRO HAND CTR (MISCELLANEOUS) ×2 IMPLANT
PREP CHG 10.5 TEAL (MISCELLANEOUS) ×2 IMPLANT
SUT MNCRL AB 4-0 PS2 18 (SUTURE) ×2 IMPLANT
SUT PROLENE 0 CT 1 30 (SUTURE) ×2 IMPLANT
SUTURE VIC 3-0 (SUTURE) ×2 IMPLANT
TOWEL OR 17X26 4PK STRL BLUE (TOWEL DISPOSABLE) ×2 IMPLANT

## 2015-09-15 NOTE — ED Provider Notes (Signed)
[facility] Emergency Department Provider Note  ____________________________________________    I have reviewed the triage vital signs and the nursing notes.   HISTORY  Chief Complaint Post-op Problem    HPI David [name] is a 60 y.o. male who presents with postoperative pain. Patient had a port placed today by vascular surgery and has been having pain to the left lateral neck where the incision was made. No fevers or chills. He denies difficult to swallowing to me. No difficult breathing. He reports he called his vascular  surgeon for pain medication but they told him to come to the emergency department because they could not call in,.     Past Medical History  Diagnosis Date  . History of melanoma   . History of squamous cell carcinoma   . COPD (chronic obstructive pulmonary disease) ([place])   . Cancer [facility])     Patient Active Problem List   Diagnosis Date Noted  . 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 Skin graft      [facility]  . Peripheral vascular catheterization N/A 09/15/2015    Procedure: Glori Luis Cath Insertion;  Surgeon: Algernon Huxley, MD;  Location: [facility];  Service: Cardiovascular;  Laterality: N/A;    Current Outpatient Rx  Name  Route  Sig  Dispense  Refill  . lidocaine-prilocaine (EMLA) cream      Apply to affected area once   30 g   3   . ondansetron (ZOFRAN) 8 MG tablet   Oral   Take 1 tablet (8 mg total) by mouth 2 (two) times daily as needed. Start on the third day after cisplatin chemotherapy.   30 tablet   1   . prochlorperazine (COMPAZINE) 10 MG tablet   Oral   Take 1 tablet (10 mg total) by mouth every 6 (six) hours as needed (Nausea or vomiting).   30 tablet   1     Allergies Review of patient's allergies indicates no known allergies.  No family history on file.  Social History Social History  Substance Use Topics  . Smoking status: Current  Every Day Smoker -- 1.50 packs/day for 51 years    Types: Cigarettes    Start date: 08/12/1994  . Smokeless tobacco: Never Used  . Alcohol Use: No    Review of Systems  Constitutional: Negative for fever. Eyes: Negative for visual changes. ENT: Negative for difficult swallowing Cardiovascular: Negative for chest pain. Respiratory: Positive for cough Gastrointestinal: No vomiting  Musculoskeletal: Left neck pain as above Skin: Negative for bleeding Neurological: Negative for headaches or focal weakness Psychiatric: Positive anxiety    ____________________________________________   PHYSICAL EXAM:  VITAL SIGNS: ED Triage Vitals  Enc Vitals Group     BP 09/15/15 1757 122/77 mmHg     Pulse Rate 09/15/15 1757 89     Resp 09/15/15 1757 18     Temp 09/15/15 1757 97.6 F (36.4 C)     Temp Source 09/15/15 1757 Oral     SpO2 09/15/15 1757 98 %     Weight 09/15/15 1757 136 lb (61.689 kg)     Height 09/15/15 1757 '5\' 9"'$  (1.753 m)     Head Cir --      Peak Flow --      Pain Score 09/15/15 1758 10     Pain Loc --      Pain Edu? --  Excl. in [place]? --      Constitutional: Alert and oriented. Cachectic but no acute distress Eyes: Conjunctivae are normal.  ENT   Head: Normocephalic and atraumatic.   Mouth/Throat: Mucous membranes are moist. No pharyngeal swelling Cardiovascular: Normal rate, regular rhythm. Normal and symmetric distal pulses are present in all extremities.  Respiratory: Normal respiratory effort without tachypnea nor retractions.  Gastrointestinal: Soft and non-tender in all quadrants. No distention. There is no CVA tenderness. Genitourinary: deferred Musculoskeletal: Nontender with normal range of motion in all extremities. No abnormal swelling to the left lateral neck, port incision Cdi. Neurologic:  Normal speech and language. No gross focal neurologic deficits are appreciated. Skin:  Skin is warm, dry and intact. No rash noted. Left anterior chest  port noted. No bleeding, hematoma or discharge. Psychiatric: Mood and affect are normal. Patient exhibits appropriate insight and judgment.  ____________________________________________    LABS (pertinent positives/negatives)  Labs Reviewed - No data to display  ____________________________________________   EKG  None  ____________________________________________    RADIOLOGY I have personally reviewed any xrays that were ordered on this patient: None  ____________________________________________   PROCEDURES  Procedure(s) performed: none  Critical Care performed: none  ____________________________________________   INITIAL IMPRESSION / ASSESSMENT AND PLAN / ED COURSE  Pertinent labs & imaging results that were available during my care of the patient were reviewed by me and considered in my medical decision making (see chart for details).  Patient presents with postoperative pain. He is not given any pain medication. His incision site and port look normal. There is no swelling. No hematoma formation. Normal swallowing. Normal pharynx. No chest pain.  Morphine 4 g IM significantly helped the patient's pain. I'll discharge him with analgesics and follow-up with vascular surgery ____________________________________________   FINAL CLINICAL IMPRESSION(S) / ED DIAGNOSES  Final diagnoses:  Postoperative pain     Lavonia Drafts, MD 09/15/15 2311

## 2015-09-15 NOTE — Progress Notes (Signed)
Pt clinically stable post procedure, discharge instructions given with questions answered, vss. Eating lunch without difficulty. Denies complaints, Dr Lucky Cowboy out to speak with pt with questions answered.

## 2015-09-15 NOTE — H&P (Signed)
  [facility] History & Physical Update  The patient was interviewed and re-examined.  The patient's previous History and Physical has been reviewed and is unchanged.  There is no change in the plan of care. We plan to proceed with the scheduled procedure.  DEW,JASON, MD  09/15/2015, 12:02 PM

## 2015-09-15 NOTE — Op Note (Signed)
      [facility]       Operative Note  Date: 09/15/2015  Preoperative diagnosis:  1. Lung cancer  Postoperative diagnosis:  Same as above  Procedures: #1. Ultrasound guidance for vascular access to the left internal jugular vein. #2. Fluoroscopic guidance for placement of catheter. #3. Placement of CT compatible Port-A-Cath, left internal jugular vein.  Surgeon: Leotis Pain, MD.   Anesthesia: Local with moderate conscious sedation for approximately 20  minutes using 2 mg of Versed and 50 mcg of Fentanyl  Fluoroscopy time: less than 1 minute  Contrast used: 0  Estimated blood loss: 15 cc  Indication for the procedure:  The patient is a 60 y.o.male with lung cancer.  The patient needs a Port-A-Cath for durable venous access, chemotherapy, lab draws, and CT scans. We are asked to place this. Risks and benefits were discussed and informed consent was obtained.  Description of procedure: The patient was brought to the vascular and interventional radiology suite.  Moderate conscious sedation was administered throughout the procedure with my supervision of the RN administering medicines and monitoring the patient's vital signs, pulse oximetry, telemetry and mental status throughout from the start of the procedure until the patient was taken to the recovery room. The left neck chest and shoulder were sterilely prepped and draped, and a sterile surgical field was created. Ultrasound was used to help visualize a patent left internal jugular vein. This was then accessed under direct ultrasound guidance without difficulty with the Seldinger needle and a permanent image was recorded. A J-wire was placed. After skin nick and dilatation, the peel-away sheath was then placed over the wire. I then anesthetized an area under the clavicle approximately 1-2 fingerbreadths. A transverse incision was created and an inferior pocket was created with electrocautery and blunt dissection. The port  was then brought onto the field, placed into the pocket and secured to the chest wall with 2 Prolene sutures. The catheter was connected to the port and tunneled from the subclavicular incision to the access site. Fluoroscopic guidance was then used to cut the catheter to an appropriate length. The catheter was then placed through the peel-away sheath and the peel-away sheath was removed. The catheter tip was parked in excellent location under fluorocoscopic guidance in the mid superior vena cava. The pocket was then irrigated with antibiotic impregnated saline and the wound was closed with a running 3-0 Vicryl and a 4-0 Monocryl. The access incision was closed with a single 4-0 Monocryl. The Huber needle was used to withdraw blood and flush the port with heparinized saline. Dermabond was then placed as a dressing. The patient tolerated the procedure well and was taken to the recovery room in stable condition.   DEW,JASON 09/15/2015 2:15 PM

## 2015-09-15 NOTE — Telephone Encounter (Signed)
Patient to use Tylenol and pain should go away in a day or 2 per Dr Grayland Ormond I spoke with pt and he repeated this back to me.

## 2015-09-15 NOTE — ED Notes (Signed)
Family states called doctor to have him call in pain medication. Told they needed to come in to ER to get pain prescription.

## 2015-09-15 NOTE — Discharge Instructions (Signed)
Pain Relief Preoperatively and Postoperatively  If you have questions, problems, or concerns about the pain that you may feel after surgery, let your health care provider know. Patients have the right to assessment and management of pain. Severe pain after surgery--and the fear or anxiety associated with that pain--may cause extreme discomfort that:  · Prevents sleep.  · Decreases the ability to breathe deeply and to cough. This can result in pneumonia or other upper airway infections.  · Causes the heart to beat more quickly and the blood pressure to be higher.  · Increases the risk for constipation and bloating.  · Decreases the ability of wounds to heal.  · May result in depression, increased anxiety, and feelings of helplessness.  Relieving pain before surgery (preoperatively) is also important because it lessens pain that you have after surgery (postoperatively). Patients who receive pain relief both before and after surgery experience greater pain relief than those who receive pain relief only after surgery. Let your health care provider know if you are having uncontrolled pain. This is very important. Pain after surgery is more difficult to manage if it is severe, so receiving prompt and adequate treatment of acute pain is necessary. If you become constipated after taking pain medicine, drink more liquids if you can. Your health care provider may have you take a mild laxative.  PAIN CONTROL METHODS  Your health care providers follow policies and procedures about the management of your pain. These guidelines should be explained to you before surgery. Plans for pain control after surgery must be decided upon by you and your health care provider and put into use with your full understanding and agreement. Do not be afraid to ask questions about the care that you are receiving.  Your health care providers will attempt to control your pain in various ways, and these methods may be used together (multimodal  analgesia). Using this approach has many benefits for you, including being able to eat, move around, and leave the hospital sooner.  As-Needed Pain Control  · You may be given pain medicine through an IV tube or as a pill or liquid that you can swallow. Let your health care provider know when you are having pain, and he or she will give you the pain medicine that is ordered for you.  IV Patient-Controlled Analgesia (PCA) Pump  · You can receive your pain medicine through an IV tube that goes into one of your veins. You can control the amount of pain medicine that you get. The pain medicine is controlled by a pump. When you push the button that is hooked up to this pump, you receive a specific amount of pain medicine. This button should be pushed only by you or by someone who is specifically assigned by you to do so. It is set up to keep you from accidentally giving yourself too much pain medicine. You will be able to start using your pain pump in the recovery room after your surgery. This method can be helpful for most types of surgery.  · Tell your health care provider:    If you are having too much pain.    If you are feeling too sleepy or nauseous.  Continuous Epidural Pain Control  · A thin, soft tube (catheter) is put into your back, outside the outer layer of your spinal cord. Pain medicine flows through the catheter to lessen pain in areas of your body that are below the level of catheter placement. Continuous epidural pain control may work best for you   if you are having surgery on your abdomen, hip area, or legs. The epidural catheter is usually put into your back shortly before surgery. It is left in until you can eat, take medicine by mouth, pass urine, and have a bowel movement.  · Giving pain medicine through the epidural catheter may help you to heal more quickly because you can do these things sooner:    Regain normal bowel and bladder function.    Return to eating.    Get up and walk.  Medicine That  Numbs the Area (Local Anesthetic)  You may be given pain medicine:  · As an injection near the area of the pain (local infiltration).  · As an injection near the nerve that controls the sensation to a specific part of your body (peripheral nerve block).  · In your spine to block pain (spinal block).  · Through a local anesthetic reservoir pump. If your surgeon or anesthesiologist selects this option as a part of your pain control, one or more thin, soft tubes will be inserted into your incision site(s) at the end of surgery. These tubes will be connected to a device that is filled with a non-narcotic pain medicine. This medicine gradually empties into your incision site over the next several days. Usually, after all of the medicine is used, your health care provider will remove the tubes and throw away the device.  Opioids  · Moderate to moderately severe acute pain after surgery may respond to opioids. Opioids are narcotic pain medicine. Opioids are often combined with non-narcotic medicines to improve pain relief, lower the risk of side effects, and reduce the chance of addiction.  · If you follow your health care provider's directions about taking opioids and you do not have a history of substance abuse, your risk of becoming addicted is very small. To prevent addiction, opioids are given for short periods of time in careful doses.  Other Methods of Pain Control  · Steroids.  · Physical therapy.  · Heat and cold therapy.  · Compression, such as wrapping an elastic bandage around the area of the pain.  · Massage.     This information is not intended to replace advice given to you by your health care provider. Make sure you discuss any questions you have with your health care provider.     Document Released: 10/16/2002 Document Revised: 08/16/2014 Document Reviewed: 10/20/2010  Elsevier Interactive Patient Education ©2016 [employer]

## 2015-09-15 NOTE — Telephone Encounter (Signed)
Called to ask for pain med post port insertion today and stated that he needs EMLA cream too. I spoke with Dr Grayland Ormond who released his pre chemo order and so his EMLA and anti emetics are ready to be sent in. I called and spoke with specials recovery who stated they feel that the daughter (who is not on his approved list to discuss his care)  is drug seeking. They report that the patient had absolutely no pain the whole time he was there and was laughing and joking around. They had to call family numerous times to come and get the patient and the first thing out of the daughters mouth when she came in was that he needs pain med. Dr Lucky Cowboy did not give med after he spoke with her.   I called and spoke with patient who does report that he has pain when he swallows

## 2015-09-15 NOTE — Discharge Instructions (Signed)
, Care After °Refer to this sheet in the next few weeks. These instructions provide you with information on caring for yourself after your procedure. Your caregiver may also give you more specific instructions. Your treatment has been planned according to current medical practices, but problems sometimes occur. Call your caregiver if you have any problems or questions after your procedure.  °HOME CARE INSTRUCTIONS °· Rest at home the day of the procedure. You will likely be able to return to normal activities the following day. °· Follow your caregiver's specific instructions for the type of device that you have. °· Only take over-the-counter or prescription medicines as directed by your caregiver. °· Keep the insertion site of the catheter clean and dry at all times. °¨ Change the bandages (dressings) over the catheter site as directed by your caregiver. °¨ Wash the area around the catheter site during each dressing change. Sponge bathe the area using a germ-killing (antiseptic) solution as directed by your caregiver. °¨ Look for redness or swelling at the insertion site during each dressing change. °· Apply an antibiotic ointment as directed by your caregiver. °· Flush your catheter as directed to keep it from becoming clogged. °· Always wash your hands thoroughly before changing dressings or flushing the catheter. °· Do not let air enter the catheter. °¨ Never open the cap at the catheter tip. °¨ Always make sure there is no air in the syringe or in the tubing for infusions.    °· Do not lift anything heavy. °· Do not drive until your caregiver approves. °· Do not shower or bathe until your caregiver approves. When you shower or bathe, place a piece of plastic wrap over the catheter site. Do not allow the catheter site or the dressing to get wet. If taking a bath, do not allow the catheter to get submerged in the water. °If the catheter was inserted through an arm vein:  °· Avoid wearing tight clothes or jewelry  on the arm that has the catheter.   °· Do not sleep with your head on the arm that has the catheter.   °· Do not allow use of a blood pressure cuff on the arm that has the catheter.   °· Do not let anyone draw blood from the arm that has the catheter, except through the catheter itself. °SEEK MEDICAL CARE IF: °· You have bleeding at the insertion site of the catheter.   °· You feel weak or nauseous.   °· Your catheter is not working properly.   °· You have redness, pain, swelling, and warmth at the insertion site.   °· You notice fluid draining from the insertion site.   °SEEK IMMEDIATE MEDICAL CARE IF: °· Your catheter breaks or has a hole in it.   °· Your catheter comes loose or gets pulled completely out. If this happens, hold firm pressure over the area with your hand or a clean cloth.   °· You have a fever. °· You have chills.   °· Your catheter becomes totally blocked.   °· You have swelling in your arm, shoulder, neck, or face.   °· You have bleeding from the insertion site that does not stop.   °· You develop chest pain or have trouble breathing.   °· You feel dizzy or faint.   °MAKE SURE YOU: °· Understand these instructions. °· Will watch your condition. °· Will get help right away if you are not doing well or get worse. °  °This information is not intended to replace advice given to you by your health care provider. Make sure you discuss any questions you   have with your health care provider. °  °Document Released: 07/12/2012 Document Revised: 03/28/2013 Document Reviewed: 07/12/2012 °Elsevier Interactive Patient Education ©2016 [employer] ° °

## 2015-09-15 NOTE — ED Notes (Signed)
Pt discharged to home with family.  Discharge paperwork reviewed with patient and family.  Prescriptions given to patient.

## 2015-09-15 NOTE — ED Notes (Signed)
Had port placement this am for chemo start. Pain port insertion site and throat. No stridor. Able to swallow water but states hurts to swallow. Mild bruising around surgery site. No edema noted.

## 2015-09-16 DIAGNOSIS — Z510 Encounter for antineoplastic radiation therapy: Secondary | ICD-10-CM | POA: Diagnosis not present

## 2015-09-22 ENCOUNTER — Inpatient Hospital Stay: Payer: Medicaid Other

## 2015-09-22 ENCOUNTER — Inpatient Hospital Stay (HOSPITAL_BASED_OUTPATIENT_CLINIC_OR_DEPARTMENT_OTHER): Payer: Medicaid Other | Admitting: Oncology

## 2015-09-22 ENCOUNTER — Ambulatory Visit
Admission: RE | Admit: 2015-09-22 | Discharge: 2015-09-22 | Disposition: A | Discharge status: Home / Self Care | Payer: Medicaid Other | Source: Ambulatory Visit | Attending: Radiation Oncology | Admitting: Radiation Oncology

## 2015-09-22 VITALS — BP 136/84 | HR 63 | Temp 96.5°F | Resp 16 | Wt 135.4 lb

## 2015-09-22 DIAGNOSIS — R634 Abnormal weight loss: Secondary | ICD-10-CM

## 2015-09-22 DIAGNOSIS — C3491 Malignant neoplasm of unspecified part of right bronchus or lung: Secondary | ICD-10-CM

## 2015-09-22 DIAGNOSIS — R05 Cough: Secondary | ICD-10-CM | POA: Diagnosis not present

## 2015-09-22 DIAGNOSIS — C3411 Malignant neoplasm of upper lobe, right bronchus or lung: Secondary | ICD-10-CM | POA: Diagnosis not present

## 2015-09-22 DIAGNOSIS — F17210 Nicotine dependence, cigarettes, uncomplicated: Secondary | ICD-10-CM

## 2015-09-22 DIAGNOSIS — Z79899 Other long term (current) drug therapy: Secondary | ICD-10-CM

## 2015-09-22 DIAGNOSIS — Z5111 Encounter for antineoplastic chemotherapy: Secondary | ICD-10-CM | POA: Diagnosis present

## 2015-09-22 DIAGNOSIS — M109 Gout, unspecified: Secondary | ICD-10-CM | POA: Diagnosis not present

## 2015-09-22 DIAGNOSIS — J449 Chronic obstructive pulmonary disease, unspecified: Secondary | ICD-10-CM | POA: Diagnosis not present

## 2015-09-22 DIAGNOSIS — Z85820 Personal history of malignant melanoma of skin: Secondary | ICD-10-CM | POA: Diagnosis not present

## 2015-09-22 DIAGNOSIS — Z510 Encounter for antineoplastic radiation therapy: Secondary | ICD-10-CM | POA: Diagnosis not present

## 2015-09-22 LAB — CBC WITH DIFFERENTIAL/PLATELET
BASOS ABS: 0 10*3/uL (ref 0–0.1)
BASOS PCT: 1 %
EOS ABS: 0.1 10*3/uL (ref 0–0.7)
EOS PCT: 2 %
HCT: 42 % (ref 40.0–52.0)
Hemoglobin: 14.7 g/dL (ref 13.0–18.0)
Lymphocytes Relative: 39 %
Lymphs Abs: 2.3 10*3/uL (ref 1.0–3.6)
MCH: 30.9 pg (ref 26.0–34.0)
MCHC: 35 g/dL (ref 32.0–36.0)
MCV: 88.3 fL (ref 80.0–100.0)
Monocytes Absolute: 0.5 10*3/uL (ref 0.2–1.0)
Monocytes Relative: 9 %
Neutro Abs: 2.9 10*3/uL (ref 1.4–6.5)
Neutrophils Relative %: 49 %
PLATELETS: 206 10*3/uL (ref 150–440)
RBC: 4.76 MIL/uL (ref 4.40–5.90)
RDW: 14.1 % (ref 11.5–14.5)
WBC: 6 10*3/uL (ref 3.8–10.6)

## 2015-09-22 LAB — COMPREHENSIVE METABOLIC PANEL
ALT: 9 U/L — AB (ref 17–63)
AST: 13 U/L — AB (ref 15–41)
Albumin: 3.7 g/dL (ref 3.5–5.0)
Alkaline Phosphatase: 62 U/L (ref 38–126)
Anion gap: 4 — ABNORMAL LOW (ref 5–15)
BUN: 10 mg/dL (ref 6–20)
CHLORIDE: 105 mmol/L (ref 101–111)
CO2: 26 mmol/L (ref 22–32)
CREATININE: 1.02 mg/dL (ref 0.61–1.24)
Calcium: 8.5 mg/dL — ABNORMAL LOW (ref 8.9–10.3)
GFR calc non Af Amer: >60 mL/min (ref >60)
Glucose, Bld: 112 mg/dL — ABNORMAL HIGH (ref 65–99)
POTASSIUM: 3.3 mmol/L — AB (ref 3.5–5.1)
SODIUM: 135 mmol/L (ref 135–145)
Total Bilirubin: 0.4 mg/dL (ref 0.3–1.2)
Total Protein: 7.1 g/dL (ref 6.5–8.1)

## 2015-09-22 MED ORDER — SODIUM CHLORIDE 0.9 % IV SOLN
Freq: Once | INTRAVENOUS | Status: AC
  Administered 2015-09-22: 11:00:00 via INTRAVENOUS
  Filled 2015-09-22: qty 1000

## 2015-09-22 MED ORDER — PALONOSETRON HCL INJECTION 0.25 MG/5ML
0.2500 mg | Freq: Once | INTRAVENOUS | Status: AC
  Administered 2015-09-22: 0.25 mg via INTRAVENOUS
  Filled 2015-09-22: qty 5

## 2015-09-22 MED ORDER — SODIUM CHLORIDE 0.9 % IV SOLN
Freq: Once | INTRAVENOUS | Status: AC
  Administered 2015-09-22: 13:00:00 via INTRAVENOUS
  Filled 2015-09-22: qty 5

## 2015-09-22 MED ORDER — SODIUM CHLORIDE 0.9 % IV SOLN
80.0000 mg/m2 | Freq: Once | INTRAVENOUS | Status: AC
  Administered 2015-09-22: 139 mg via INTRAVENOUS
  Filled 2015-09-22: qty 139

## 2015-09-22 MED ORDER — POTASSIUM CHLORIDE 2 MEQ/ML IV SOLN
Freq: Once | INTRAVENOUS | Status: AC
  Administered 2015-09-22: 11:00:00 via INTRAVENOUS
  Filled 2015-09-22: qty 1000

## 2015-09-22 MED ORDER — HEPARIN SOD (PORK) LOCK FLUSH 100 UNIT/ML IV SOLN
500.0000 [IU] | Freq: Once | INTRAVENOUS | Status: DC | PRN
  Filled 2015-09-22: qty 5

## 2015-09-22 MED ORDER — SODIUM CHLORIDE 0.9 % IV SOLN
80.0000 mg/m2 | Freq: Once | INTRAVENOUS | Status: AC
  Administered 2015-09-22: 140 mg via INTRAVENOUS
  Filled 2015-09-22: qty 7

## 2015-09-22 MED ORDER — OXYCODONE-ACETAMINOPHEN 5-325 MG PO TABS: 1.0000 | ORAL_TABLET | ORAL | Status: DC | PRN

## 2015-09-22 MED ORDER — SODIUM CHLORIDE 0.9% FLUSH
10.0000 mL | INTRAVENOUS | Status: DC | PRN
  Administered 2015-09-22: 10 mL
  Filled 2015-09-22: qty 10

## 2015-09-22 NOTE — Progress Notes (Signed)
Patient is having pain due to gout in his wrist, elbows, and knees.  He is requesting Percocet because it has helped in the past.

## 2015-09-23 ENCOUNTER — Inpatient Hospital Stay: Payer: Medicaid Other

## 2015-09-23 ENCOUNTER — Ambulatory Visit
Admission: RE | Admit: 2015-09-23 | Discharge: 2015-09-23 | Disposition: A | Discharge status: Home / Self Care | Payer: Medicaid Other | Source: Ambulatory Visit | Attending: Radiation Oncology | Admitting: Radiation Oncology

## 2015-09-23 ENCOUNTER — Telehealth: Payer: Self-pay | Admitting: *Deleted

## 2015-09-23 VITALS — BP 107/63 | HR 64 | Temp 96.8°F | Resp 18

## 2015-09-23 DIAGNOSIS — C3491 Malignant neoplasm of unspecified part of right bronchus or lung: Secondary | ICD-10-CM

## 2015-09-23 DIAGNOSIS — Z510 Encounter for antineoplastic radiation therapy: Secondary | ICD-10-CM | POA: Diagnosis not present

## 2015-09-23 DIAGNOSIS — Z5111 Encounter for antineoplastic chemotherapy: Secondary | ICD-10-CM | POA: Diagnosis not present

## 2015-09-23 MED ORDER — SODIUM CHLORIDE 0.9% FLUSH
10.0000 mL | INTRAVENOUS | Status: DC | PRN
  Administered 2015-09-23: 10 mL
  Filled 2015-09-23: qty 10

## 2015-09-23 MED ORDER — SODIUM CHLORIDE 0.9 % IV SOLN
10.0000 mg | Freq: Once | INTRAVENOUS | Status: AC
  Administered 2015-09-23: 10 mg via INTRAVENOUS
  Filled 2015-09-23: qty 1

## 2015-09-23 MED ORDER — SODIUM CHLORIDE 0.9 % IV SOLN
Freq: Once | INTRAVENOUS | Status: AC
  Administered 2015-09-23: 14:00:00 via INTRAVENOUS
  Filled 2015-09-23: qty 1000

## 2015-09-23 MED ORDER — HEPARIN SOD (PORK) LOCK FLUSH 100 UNIT/ML IV SOLN
500.0000 [IU] | Freq: Once | INTRAVENOUS | Status: AC | PRN
  Administered 2015-09-23: 500 [IU]
  Filled 2015-09-23: qty 5

## 2015-09-23 MED ORDER — SODIUM CHLORIDE 0.9 % IV SOLN
80.0000 mg/m2 | Freq: Once | INTRAVENOUS | Status: AC
  Administered 2015-09-23: 140 mg via INTRAVENOUS
  Filled 2015-09-23: qty 7

## 2015-09-23 NOTE — Telephone Encounter (Signed)
Since he has started chemo, it's unlike to help, but if he insists I have no problem with it.

## 2015-09-23 NOTE — Telephone Encounter (Signed)
Asking if he can get the flu shot

## 2015-09-23 NOTE — Telephone Encounter (Signed)
Called Cassandra back and informed her of Dr Gary Fleet reply, she thanked me for calling

## 2015-09-24 ENCOUNTER — Other Ambulatory Visit: Payer: Self-pay | Admitting: Oncology

## 2015-09-24 ENCOUNTER — Ambulatory Visit
Admission: RE | Admit: 2015-09-24 | Discharge: 2015-09-24 | Disposition: A | Discharge status: Home / Self Care | Payer: Medicaid Other | Source: Ambulatory Visit | Attending: Radiation Oncology | Admitting: Radiation Oncology

## 2015-09-24 ENCOUNTER — Inpatient Hospital Stay: Payer: Medicaid Other

## 2015-09-24 ENCOUNTER — Ambulatory Visit
Admission: RE | Admit: 2015-09-24 | Discharge: 2015-09-24 | Disposition: A | Discharge status: Home / Self Care | Payer: Medicaid Other | Source: Ambulatory Visit | Attending: Oncology | Admitting: Oncology

## 2015-09-24 DIAGNOSIS — C3491 Malignant neoplasm of unspecified part of right bronchus or lung: Secondary | ICD-10-CM

## 2015-09-24 DIAGNOSIS — Z5111 Encounter for antineoplastic chemotherapy: Secondary | ICD-10-CM | POA: Diagnosis not present

## 2015-09-24 DIAGNOSIS — Z510 Encounter for antineoplastic radiation therapy: Secondary | ICD-10-CM | POA: Diagnosis not present

## 2015-09-24 MED ORDER — SODIUM CHLORIDE 0.9% FLUSH
10.0000 mL | INTRAVENOUS | Status: DC | PRN
  Filled 2015-09-24: qty 10

## 2015-09-24 MED ORDER — CEPHALEXIN 500 MG PO CAPS: 500.0000 mg | ORAL_CAPSULE | Freq: Three times a day (TID) | ORAL | Status: DC

## 2015-09-24 MED ORDER — GADOBENATE DIMEGLUMINE 529 MG/ML IV SOLN
15.0000 mL | Freq: Once | INTRAVENOUS | Status: AC | PRN
  Administered 2015-09-24: 12 mL via INTRAVENOUS

## 2015-09-24 MED ORDER — SODIUM CHLORIDE 0.9 % IV SOLN
Freq: Once | INTRAVENOUS | Status: AC
  Administered 2015-09-24: 12:00:00 via INTRAVENOUS
  Filled 2015-09-24: qty 1000

## 2015-09-24 MED ORDER — HEPARIN SOD (PORK) LOCK FLUSH 100 UNIT/ML IV SOLN: 500.0000 [IU] | Freq: Once | INTRAVENOUS | Status: DC | PRN

## 2015-09-24 MED ORDER — SODIUM CHLORIDE 0.9 % IV SOLN
10.0000 mg | Freq: Once | INTRAVENOUS | Status: AC
  Administered 2015-09-24: 10 mg via INTRAVENOUS
  Filled 2015-09-24: qty 1

## 2015-09-24 MED ORDER — SODIUM CHLORIDE 0.9 % IV SOLN
80.0000 mg/m2 | Freq: Once | INTRAVENOUS | Status: AC
  Administered 2015-09-24: 140 mg via INTRAVENOUS
  Filled 2015-09-24: qty 7

## 2015-09-24 NOTE — Progress Notes (Signed)
Did not use port for infusion and it appeared to be infected.  Contacted Dr. Grayland Ormond and he called in and Rx for oral antibiotics.  cleaned with chlorohexadine and covered port area with clean allevyn dressing .  Patient advised to start antibiotics today and leave dressing on overnight.  Will check again tomorrow when patient comes in for radiation

## 2015-09-25 ENCOUNTER — Ambulatory Visit
Admission: RE | Admit: 2015-09-25 | Discharge: 2015-09-25 | Disposition: A | Discharge status: Home / Self Care | Payer: Medicaid Other | Source: Ambulatory Visit | Attending: Radiation Oncology | Admitting: Radiation Oncology

## 2015-09-25 DIAGNOSIS — Z510 Encounter for antineoplastic radiation therapy: Secondary | ICD-10-CM | POA: Diagnosis not present

## 2015-09-26 ENCOUNTER — Ambulatory Visit: Payer: Medicaid Other

## 2015-09-29 ENCOUNTER — Ambulatory Visit
Admission: RE | Admit: 2015-09-29 | Discharge: 2015-09-29 | Disposition: A | Discharge status: Home / Self Care | Payer: Medicaid Other | Source: Ambulatory Visit | Attending: Radiation Oncology | Admitting: Radiation Oncology

## 2015-09-29 ENCOUNTER — Inpatient Hospital Stay: Payer: Medicaid Other

## 2015-09-29 ENCOUNTER — Inpatient Hospital Stay (HOSPITAL_BASED_OUTPATIENT_CLINIC_OR_DEPARTMENT_OTHER): Payer: Medicaid Other | Admitting: Oncology

## 2015-09-29 VITALS — BP 107/70 | HR 86 | Temp 98.3°F | Resp 16 | Wt 134.7 lb

## 2015-09-29 DIAGNOSIS — F17210 Nicotine dependence, cigarettes, uncomplicated: Secondary | ICD-10-CM | POA: Diagnosis not present

## 2015-09-29 DIAGNOSIS — Z85820 Personal history of malignant melanoma of skin: Secondary | ICD-10-CM

## 2015-09-29 DIAGNOSIS — M109 Gout, unspecified: Secondary | ICD-10-CM

## 2015-09-29 DIAGNOSIS — Z79899 Other long term (current) drug therapy: Secondary | ICD-10-CM

## 2015-09-29 DIAGNOSIS — C3411 Malignant neoplasm of upper lobe, right bronchus or lung: Secondary | ICD-10-CM

## 2015-09-29 DIAGNOSIS — C3491 Malignant neoplasm of unspecified part of right bronchus or lung: Secondary | ICD-10-CM

## 2015-09-29 DIAGNOSIS — Z510 Encounter for antineoplastic radiation therapy: Secondary | ICD-10-CM | POA: Diagnosis not present

## 2015-09-29 DIAGNOSIS — Z5111 Encounter for antineoplastic chemotherapy: Secondary | ICD-10-CM | POA: Diagnosis not present

## 2015-09-29 LAB — COMPREHENSIVE METABOLIC PANEL
ALBUMIN: 4.2 g/dL (ref 3.5–5.0)
ALK PHOS: 64 U/L (ref 38–126)
ALT: 21 U/L (ref 17–63)
AST: 16 U/L (ref 15–41)
Anion gap: 3 — ABNORMAL LOW (ref 5–15)
BILIRUBIN TOTAL: 0.4 mg/dL (ref 0.3–1.2)
BUN: 18 mg/dL (ref 6–20)
CALCIUM: 8.9 mg/dL (ref 8.9–10.3)
CO2: 30 mmol/L (ref 22–32)
Chloride: 101 mmol/L (ref 101–111)
Creatinine, Ser: 1.08 mg/dL (ref 0.61–1.24)
GFR calc Af Amer: >60 mL/min (ref >60)
GFR calc non Af Amer: >60 mL/min (ref >60)
GLUCOSE: 70 mg/dL (ref 65–99)
Potassium: 4.3 mmol/L (ref 3.5–5.1)
Sodium: 134 mmol/L — ABNORMAL LOW (ref 135–145)
TOTAL PROTEIN: 7.3 g/dL (ref 6.5–8.1)

## 2015-09-29 LAB — CBC WITH DIFFERENTIAL/PLATELET
BASOS ABS: 0 10*3/uL (ref 0–0.1)
BASOS PCT: 1 %
Eosinophils Absolute: 0 10*3/uL (ref 0–0.7)
Eosinophils Relative: 1 %
HEMATOCRIT: 40.8 % (ref 40.0–52.0)
HEMOGLOBIN: 14.2 g/dL (ref 13.0–18.0)
Lymphocytes Relative: 26 %
Lymphs Abs: 1.3 10*3/uL (ref 1.0–3.6)
MCH: 31 pg (ref 26.0–34.0)
MCHC: 34.9 g/dL (ref 32.0–36.0)
MCV: 88.7 fL (ref 80.0–100.0)
Monocytes Absolute: 0.1 10*3/uL — ABNORMAL LOW (ref 0.2–1.0)
Monocytes Relative: 1 %
NEUTROS ABS: 3.4 10*3/uL (ref 1.4–6.5)
NEUTROS PCT: 71 %
Platelets: 158 10*3/uL (ref 150–440)
RBC: 4.6 MIL/uL (ref 4.40–5.90)
RDW: 13.7 % (ref 11.5–14.5)
WBC: 4.8 10*3/uL (ref 3.8–10.6)

## 2015-09-29 MED ORDER — ALLOPURINOL 300 MG PO TABS: 300.0000 mg | ORAL_TABLET | Freq: Every day | ORAL | Status: DC

## 2015-09-29 MED ORDER — COLCHICINE 0.6 MG PO TABS: ORAL_TABLET | ORAL | Status: DC

## 2015-09-29 NOTE — Progress Notes (Signed)
Patient reports having all over that started this weekend that is not relieved with Oxycodone.

## 2015-09-29 NOTE — Progress Notes (Signed)
[place]  [phone]) [phone] Fax:(336) [phone]  ID: David [name] OB: 10/08/1955  MR#: [id number]  CVE#:[id number]  Patient Care Team: No Pcp Per Patient as PCP - General (General Practice)  CHIEF COMPLAINT:  Chief Complaint  Patient presents with  . Lung Cancer    INTERVAL HISTORY: Patient returns to clinic today one week post cycle 1 of 4 of cisplatin and etoposide. He is still complaining of pain and has asked for stronger pain medication to help him sleep. The pain he is complaining about is in his legs/feet and he thinks it is from gout. He states he has no more cough or shortness of breath. He is eating well. He has no neurologic complaints. He does not complain of any further weight loss. He denies any fevers. He denies any chest pain or hemoptysis. He denies any nausea, vomiting, constipation, or diarrhea. He has no urinary complaints. Patient offers no further specific complaints.  REVIEW OF SYSTEMS:   Review of Systems  Constitutional: Negative for fever, weight loss and malaise/fatigue.  Respiratory: Negative for cough, hemoptysis and shortness of breath.   Cardiovascular: Negative.  Negative for chest pain.  Gastrointestinal: Negative.   Musculoskeletal: Positive for joint pain.  Neurological: Negative.  Negative for weakness and headaches.    As per HPI. Otherwise, a complete review of systems is negatve.  PAST MEDICAL HISTORY: Past Medical History  Diagnosis Date  . History of melanoma   . History of squamous cell carcinoma   . COPD (chronic obstructive pulmonary disease) ([place])   . Cancer [facility])     PAST SURGICAL HISTORY: Past Surgical History  Procedure Laterality Date  . Skin graft      [facility]  . Peripheral vascular catheterization N/A 09/15/2015    Procedure: Glori Luis Cath Insertion;  Surgeon: Algernon Huxley, MD;  Location: [place] [facility];  Service: Cardiovascular;  Laterality: N/A;    FAMILY HISTORY: Reviewed and unchanged. No  reported history of malignancy or chronic disease.     ADVANCED DIRECTIVES:    HEALTH MAINTENANCE: Social History  Substance Use Topics  . Smoking status: Current Every Day Smoker -- 1.50 packs/day for 51 years    Types: Cigarettes    Start date: 08/12/1994  . Smokeless tobacco: Never Used  . Alcohol Use: No     Colonoscopy:  PAP:  Bone density:  Lipid panel:  No Known Allergies  Current Outpatient Prescriptions  Medication Sig Dispense Refill  . cephALEXin (KEFLEX) 500 MG capsule Take 1 capsule (500 mg total) by mouth 3 (three) times daily. 21 capsule 0  . lidocaine-prilocaine (EMLA) cream Apply to affected area once 30 g 3  . ondansetron (ZOFRAN) 8 MG tablet Take 1 tablet (8 mg total) by mouth 2 (two) times daily as needed. Start on the third day after cisplatin chemotherapy. 30 tablet 1  . oxyCODONE-acetaminophen (PERCOCET/ROXICET) 5-325 MG tablet Take 1 tablet by mouth every 4 (four) hours as needed for severe pain. 30 tablet 0  . prochlorperazine (COMPAZINE) 10 MG tablet Take 1 tablet (10 mg total) by mouth every 6 (six) hours as needed (Nausea or vomiting). 30 tablet 1  . allopurinol (ZYLOPRIM) 300 MG tablet Take 1 tablet (300 mg total) by mouth daily. 30 tablet 0  . colchicine 0.6 MG tablet Take 2 tablets (1.2 mg) and then one hour later take on tablet (0.6 mg) 3 tablet 0   No current facility-administered medications for this visit.    OBJECTIVE: Filed Vitals:  09/29/15 1020  BP: 107/70  Pulse: 86  Temp: 98.3 F (36.8 C)  Resp: 16     Body mass index is 19.88 kg/(m^2).    ECOG FS:0 - Asymptomatic  General: Thin, no acute distress. Eyes: Pink conjunctiva, anicteric sclera. Lungs: Clear to auscultation bilaterally. Heart: Regular rate and rhythm. No rubs, murmurs, or gallops. Abdomen: Soft, nontender, nondistended. No organomegaly noted, normoactive bowel sounds. Musculoskeletal: No edema, cyanosis, or clubbing. Neuro: Alert, answering all questions  appropriately. Cranial nerves grossly intact. Skin: No rashes or petechiae noted. Psych: Normal affect.  LAB RESULTS:  Lab Results  Component Value Date   NA 134* 09/29/2015   K 4.3 09/29/2015   CL 101 09/29/2015   CO2 30 09/29/2015   GLUCOSE 70 09/29/2015   BUN 18 09/29/2015   CREATININE 1.08 09/29/2015   CALCIUM 8.9 09/29/2015   PROT 7.3 09/29/2015   ALBUMIN 4.2 09/29/2015   AST 16 09/29/2015   ALT 21 09/29/2015   ALKPHOS 64 09/29/2015   BILITOT 0.4 09/29/2015   GFRNONAA >60 09/29/2015   GFRAA >60 09/29/2015    Lab Results  Component Value Date   WBC 4.8 09/29/2015   NEUTROABS 3.4 09/29/2015   HGB 14.2 09/29/2015   HCT 40.8 09/29/2015   MCV 88.7 09/29/2015   PLT 158 09/29/2015     STUDIES: Dg Chest 1 View  09/01/2015  CLINICAL DATA:  Status post biopsy EXAM: CHEST 1 VIEW COMPARISON:  08/11/2015 FINDINGS: Status post biopsy of a right upper lobe pulmonary nodule. There is no other pulmonary opacity. There is no pleural effusion or pneumothorax. The heart and mediastinal contours are unremarkable. The osseous structures are unremarkable. IMPRESSION: No right pneumothorax status post percutaneous biopsy of a right upper lobe pulmonary nodule. Electronically Signed   By: Kathreen Devoid   On: 09/01/2015 13:54   Mr Brain W Wo Contrast  09/24/2015  CLINICAL DATA:  Small-cell lung cancer, right. Initial staging. No reported neurological complaints. EXAM: MRI HEAD WITHOUT AND WITH CONTRAST TECHNIQUE: Multiplanar, multiecho pulse sequences of the brain and surrounding structures were obtained without and with intravenous contrast. CONTRAST:  36m MULTIHANCE GADOBENATE DIMEGLUMINE 529 MG/ML IV SOLN COMPARISON:  Head CT 12/05/2005 FINDINGS: The study is mildly motion degraded. There is no evidence of acute infarct, intracranial hemorrhage, mass, midline shift, or extra-axial fluid collection. Ventricles and sulci are normal. Scattered, punctate foci of T2 hyperintensity in the cerebral  white matter bilaterally are nonspecific but compatible with minimal chronic small vessel ischemic disease. No abnormal enhancement is identified. Orbits are unremarkable. No significant inflammatory disease is seen in the paranasal sinuses are mastoid air cells. Major intracranial vascular flow voids are preserved. IMPRESSION: No evidence of intracranial metastases. Electronically Signed   By: ALogan BoresM.D.   On: 09/24/2015 14:40   Ct Biopsy  09/01/2015  INDICATION: 60year old male smoker with a hypermetabolic right upper lobe pulmonary nodule concerning for primary bronchogenic carcinoma. EXAM: CT-guided biopsy right upper lobe lobe pulmonary nodule Interventional Radiologist:  HCriselda Peaches MD MEDICATIONS: None. ANESTHESIA/SEDATION: Fentanyl 75 mcg IV; Versed 3 mg IV Moderate Sedation Time:  20 The patient was continuously monitored during the procedure by the interventional radiology nurse under my direct supervision. FLUOROSCOPY TIME:  None COMPLICATIONS: None immediate. Estimated blood loss:  0 PROCEDURE: Informed written consent was obtained from the patient after a thorough discussion of the procedural risks, benefits and alternatives. All questions were addressed. Maximal Sterile Barrier Technique was utilized including caps, mask, sterile gowns, sterile gloves,  sterile drape, hand hygiene and skin antiseptic. A timeout was performed prior to the initiation of the procedure. A planning axial CT scan was performed. The nodule in the right upper lobe was successfully identified. A suitable skin entry site was selected and marked. The region was then sterilely prepped and draped in standard fashion using Betadine skin prep. Local anesthesia was attained by infiltration with 1% lidocaine. A small dermatotomy was made. Under intermittent CT fluoroscopic guidance, a 17 gauge trocar needle was advanced into the lung and positioned at the margin of the nodule. Multiple 18 gauge core biopsies were  then coaxially obtained using the BioPince automated biopsy device. Biopsy specimens were placed in formalin and delivered to pathology for further analysis. A bio sentry device was successfully deployed. Post biopsy axial CT imaging demonstrates no evidence of immediate complication. There is no pneumothorax. Mild perilesional alveolar hemorrhage is not unexpected. The patient tolerated the procedure well. IMPRESSION: Technically successful CT-guided biopsy right upper lobe pulmonary nodule. Signed, Criselda Peaches, MD [facility] Radiology Electronically Signed   By: Jacqulynn Cadet M.D.   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  PLAN:    1. Small cell lung cancer: PET scan and pathology results reviewed independently. MRI the brain negative for metastatic disease. Patient tolerated his first treatment of cisplatin and etoposide well. He will return to clinic in 2 weeks for labs, further evaluation and cycle 2 of cisplatin/ etoposide and then in 1 and 2 days for etoposide only.  Patient will also continue concurrent XRT. We will consider adding Neulasta once his XRT is completed. At the conclusion of his treatment, he will also require PCI.  2. Weight loss: Resolved. Likely multifactorial, unclear how much. Monitor. 3. History of melanoma: Patient is a poor historian but appears to have been greater than 3 years ago. Monitor. 4. Pain: Patient was given a prescription for Colcrys and Allopurinol today for gout.   Patient expressed understanding and was in agreement with this plan. He also understands that He can call clinic at any time with any questions, concerns, or complaints.   Mayra Reel, NP   09/29/2015 10:53 AM  Patient seen and evaluated independently and I agree with the assessment and plan as dictated above.  Lloyd Huger, MD 10/04/2015 7:25 AM

## 2015-09-29 NOTE — Progress Notes (Signed)
[facility] [name]  Telephone:(336) [phone] Fax:(336) [phone]  ID: David [name] OB: 07-Mar-1956  MR#: [id number]  YCX#:[id number]  Patient Care Team: No Pcp Per Patient as PCP - General (General Practice)  CHIEF COMPLAINT:  Chief Complaint  Patient presents with  . SCLC    INTERVAL HISTORY: Patient returns to clinic today to initiate cycle 1 of 4 of cisplatin and etoposide. He is complaining of mild pain, but otherwise feels well. He has a chronic cough that is unchanged. He has no neurologic complaints. He does not complain of any further weight loss. He denies any fevers. He denies any chest pain or hemoptysis. He denies any nausea, vomiting, constipation, or diarrhea. He has no urinary complaints. Patient offers no further specific complaints.  REVIEW OF SYSTEMS:   Review of Systems  Constitutional: Positive for weight loss. Negative for fever and malaise/fatigue.  Respiratory: Positive for cough and shortness of breath. Negative for hemoptysis.   Cardiovascular: Negative.  Negative for chest pain.  Gastrointestinal: Negative.   Musculoskeletal: Negative.   Neurological: Negative.  Negative for weakness and headaches.    As per HPI. Otherwise, a complete review of systems is negatve.  PAST MEDICAL HISTORY: Past Medical History  Diagnosis Date  . History of melanoma   . History of squamous cell carcinoma   . COPD (chronic obstructive pulmonary disease) ([place])   . Cancer [facility])     PAST SURGICAL HISTORY: Past Surgical History  Procedure Laterality Date  . Skin graft      [facility]  . Peripheral vascular catheterization N/A 09/15/2015    Procedure: Glori Luis Cath Insertion;  Surgeon: Algernon Huxley, MD;  Location: [place] [facility];  Service: Cardiovascular;  Laterality: N/A;    FAMILY HISTORY: Reviewed and unchanged. No reported history of malignancy or chronic disease.     ADVANCED DIRECTIVES:    HEALTH MAINTENANCE: Social History  Substance Use Topics  .  Smoking status: Current Every Day Smoker -- 1.50 packs/day for 51 years    Types: Cigarettes    Start date: 08/12/1994  . Smokeless tobacco: Never Used  . Alcohol Use: No     Colonoscopy:  PAP:  Bone density:  Lipid panel:  No Known Allergies  Current Outpatient Prescriptions  Medication Sig Dispense Refill  . lidocaine-prilocaine (EMLA) cream Apply to affected area once 30 g 3  . ondansetron (ZOFRAN) 8 MG tablet Take 1 tablet (8 mg total) by mouth 2 (two) times daily as needed. Start on the third day after cisplatin chemotherapy. 30 tablet 1  . prochlorperazine (COMPAZINE) 10 MG tablet Take 1 tablet (10 mg total) by mouth every 6 (six) hours as needed (Nausea or vomiting). 30 tablet 1  . cephALEXin (KEFLEX) 500 MG capsule Take 1 capsule (500 mg total) by mouth 3 (three) times daily. 21 capsule 0  . oxyCODONE-acetaminophen (PERCOCET/ROXICET) 5-325 MG tablet Take 1 tablet by mouth every 4 (four) hours as needed for severe pain. 30 tablet 0   No current facility-administered medications for this visit.    OBJECTIVE: Filed Vitals:   09/22/15 0954  BP: 136/84  Pulse: 63  Temp: 96.5 F (35.8 C)  Resp: 16     Body mass index is 19.98 kg/(m^2).    ECOG FS:0 - Asymptomatic  General: Thin, no acute distress. Eyes: Pink conjunctiva, anicteric sclera. Lungs: Clear to auscultation bilaterally. Heart: Regular rate and rhythm. No rubs, murmurs, or gallops. Abdomen: Soft, nontender, nondistended. No organomegaly noted, normoactive bowel sounds. Musculoskeletal: No edema, cyanosis,  or clubbing. Neuro: Alert, answering all questions appropriately. Cranial nerves grossly intact. Skin: No rashes or petechiae noted. Psych: Normal affect.  LAB RESULTS:  Lab Results  Component Value Date   NA 135 09/22/2015   K 3.3* 09/22/2015   CL 105 09/22/2015   CO2 26 09/22/2015   GLUCOSE 112* 09/22/2015   BUN 10 09/22/2015   CREATININE 1.02 09/22/2015   CALCIUM 8.5* 09/22/2015   PROT 7.1  09/22/2015   ALBUMIN 3.7 09/22/2015   AST 13* 09/22/2015   ALT 9* 09/22/2015   ALKPHOS 62 09/22/2015   BILITOT 0.4 09/22/2015   GFRNONAA >60 09/22/2015   GFRAA >60 09/22/2015    Lab Results  Component Value Date   WBC 6.0 09/22/2015   NEUTROABS 2.9 09/22/2015   HGB 14.7 09/22/2015   HCT 42.0 09/22/2015   MCV 88.3 09/22/2015   PLT 206 09/22/2015     STUDIES: Dg Chest 1 View  09/01/2015  CLINICAL DATA:  Status post biopsy EXAM: CHEST 1 VIEW COMPARISON:  08/11/2015 FINDINGS: Status post biopsy of a right upper lobe pulmonary nodule. There is no other pulmonary opacity. There is no pleural effusion or pneumothorax. The heart and mediastinal contours are unremarkable. The osseous structures are unremarkable. IMPRESSION: No right pneumothorax status post percutaneous biopsy of a right upper lobe pulmonary nodule. Electronically Signed   By: Kathreen Devoid   On: 09/01/2015 13:54   Mr Brain W Wo Contrast  09/24/2015  CLINICAL DATA:  Small-cell lung cancer, right. Initial staging. No reported neurological complaints. EXAM: MRI HEAD WITHOUT AND WITH CONTRAST TECHNIQUE: Multiplanar, multiecho pulse sequences of the brain and surrounding structures were obtained without and with intravenous contrast. CONTRAST:  21m MULTIHANCE GADOBENATE DIMEGLUMINE 529 MG/ML IV SOLN COMPARISON:  Head CT 12/05/2005 FINDINGS: The study is mildly motion degraded. There is no evidence of acute infarct, intracranial hemorrhage, mass, midline shift, or extra-axial fluid collection. Ventricles and sulci are normal. Scattered, punctate foci of T2 hyperintensity in the cerebral white matter bilaterally are nonspecific but compatible with minimal chronic small vessel ischemic disease. No abnormal enhancement is identified. Orbits are unremarkable. No significant inflammatory disease is seen in the paranasal sinuses are mastoid air cells. Major intracranial vascular flow voids are preserved. IMPRESSION: No evidence of intracranial  metastases. Electronically Signed   By: ALogan BoresM.D.   On: 09/24/2015 14:40   Ct Biopsy  09/01/2015  INDICATION: 60year old male smoker with a hypermetabolic right upper lobe pulmonary nodule concerning for primary bronchogenic carcinoma. EXAM: CT-guided biopsy right upper lobe lobe pulmonary nodule Interventional Radiologist:  HCriselda Peaches MD MEDICATIONS: None. ANESTHESIA/SEDATION: Fentanyl 75 mcg IV; Versed 3 mg IV Moderate Sedation Time:  20 The patient was continuously monitored during the procedure by the interventional radiology nurse under my direct supervision. FLUOROSCOPY TIME:  None COMPLICATIONS: None immediate. Estimated blood loss:  0 PROCEDURE: Informed written consent was obtained from the patient after a thorough discussion of the procedural risks, benefits and alternatives. All questions were addressed. Maximal Sterile Barrier Technique was utilized including caps, mask, sterile gowns, sterile gloves, sterile drape, hand hygiene and skin antiseptic. A timeout was performed prior to the initiation of the procedure. A planning axial CT scan was performed. The nodule in the right upper lobe was successfully identified. A suitable skin entry site was selected and marked. The region was then sterilely prepped and draped in standard fashion using Betadine skin prep. Local anesthesia was attained by infiltration with 1% lidocaine. A small dermatotomy was made. Under  intermittent CT fluoroscopic guidance, a 17 gauge trocar needle was advanced into the lung and positioned at the margin of the nodule. Multiple 18 gauge core biopsies were then coaxially obtained using the BioPince automated biopsy device. Biopsy specimens were placed in formalin and delivered to pathology for further analysis. A bio sentry device was successfully deployed. Post biopsy axial CT imaging demonstrates no evidence of immediate complication. There is no pneumothorax. Mild perilesional alveolar hemorrhage is not  unexpected. The patient tolerated the procedure well. IMPRESSION: Technically successful CT-guided biopsy right upper lobe pulmonary nodule. Signed, Criselda Peaches, MD [facility] Radiology Electronically Signed   By: Jacqulynn Cadet M.D.   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  PLAN:    1. Small cell lung cancer: PET scan and pathology results reviewed independently. MRI the brain negative for metastatic disease. Proceed with cycle 1 of 4 of cisplatin and etoposide. Patient will return to clinic in 1 and 2 days for etoposide only, in 1 week for further evaluation, and then in 3 weeks for consideration of cycle 2. Patient will also initiate concurrent XRT in the next several days. We will consider adding Neulasta once his XRT is completed. At the conclusion of his treatment, he will also require PCI.  2. Weight loss: Likely multifactorial, unclear how much. Monitor. 3. History of melanoma: Patient is a poor historian but appears to have been greater than 3 years ago. Monitor. 4. Pain: Patient was given a prescription for Percocet today.  Patient expressed understanding and was in agreement with this plan. He also understands that He can call clinic at any time with any questions, concerns, or complaints.   Lloyd Huger, MD   09/29/2015 8:22 AM

## 2015-09-30 ENCOUNTER — Ambulatory Visit
Admission: RE | Admit: 2015-09-30 | Discharge: 2015-09-30 | Disposition: A | Discharge status: Home / Self Care | Payer: Medicaid Other | Source: Ambulatory Visit | Attending: Radiation Oncology | Admitting: Radiation Oncology

## 2015-09-30 DIAGNOSIS — Z510 Encounter for antineoplastic radiation therapy: Secondary | ICD-10-CM | POA: Diagnosis not present

## 2015-10-01 ENCOUNTER — Emergency Department
Admission: EM | Admit: 2015-10-01 | Discharge: 2015-10-01 | Disposition: A | Discharge status: Home / Self Care | Payer: Medicaid Other | Attending: Emergency Medicine | Admitting: Emergency Medicine

## 2015-10-01 ENCOUNTER — Other Ambulatory Visit: Payer: Self-pay | Admitting: *Deleted

## 2015-10-01 ENCOUNTER — Emergency Department: Payer: Medicaid Other

## 2015-10-01 ENCOUNTER — Inpatient Hospital Stay: Payer: Medicaid Other

## 2015-10-01 ENCOUNTER — Ambulatory Visit: Admission: RE | Admit: 2015-10-01 | Payer: Medicaid Other | Source: Ambulatory Visit

## 2015-10-01 DIAGNOSIS — Y998 Other external cause status: Secondary | ICD-10-CM | POA: Insufficient documentation

## 2015-10-01 DIAGNOSIS — J441 Chronic obstructive pulmonary disease with (acute) exacerbation: Secondary | ICD-10-CM | POA: Diagnosis not present

## 2015-10-01 DIAGNOSIS — C3490 Malignant neoplasm of unspecified part of unspecified bronchus or lung: Secondary | ICD-10-CM | POA: Insufficient documentation

## 2015-10-01 DIAGNOSIS — S29012A Strain of muscle and tendon of back wall of thorax, initial encounter: Secondary | ICD-10-CM | POA: Insufficient documentation

## 2015-10-01 DIAGNOSIS — Y9289 Other specified places as the place of occurrence of the external cause: Secondary | ICD-10-CM | POA: Diagnosis not present

## 2015-10-01 DIAGNOSIS — R0602 Shortness of breath: Secondary | ICD-10-CM | POA: Diagnosis present

## 2015-10-01 DIAGNOSIS — C3491 Malignant neoplasm of unspecified part of right bronchus or lung: Secondary | ICD-10-CM

## 2015-10-01 DIAGNOSIS — Y9389 Activity, other specified: Secondary | ICD-10-CM | POA: Diagnosis not present

## 2015-10-01 DIAGNOSIS — F17210 Nicotine dependence, cigarettes, uncomplicated: Secondary | ICD-10-CM | POA: Insufficient documentation

## 2015-10-01 DIAGNOSIS — K921 Melena: Secondary | ICD-10-CM

## 2015-10-01 DIAGNOSIS — X58XXXA Exposure to other specified factors, initial encounter: Secondary | ICD-10-CM | POA: Insufficient documentation

## 2015-10-01 DIAGNOSIS — M546 Pain in thoracic spine: Secondary | ICD-10-CM

## 2015-10-01 LAB — CBC WITH DIFFERENTIAL/PLATELET
BASOS PCT: 1 %
Basophils Absolute: 0 10*3/uL (ref 0–0.1)
EOS ABS: 0 10*3/uL (ref 0–0.7)
Eosinophils Relative: 1 %
HCT: 41 % (ref 40.0–52.0)
HEMOGLOBIN: 13.9 g/dL (ref 13.0–18.0)
LYMPHS ABS: 1.4 10*3/uL (ref 1.0–3.6)
Lymphocytes Relative: 34 %
MCH: 30.1 pg (ref 26.0–34.0)
MCHC: 33.9 g/dL (ref 32.0–36.0)
MCV: 88.8 fL (ref 80.0–100.0)
Monocytes Absolute: 0.2 10*3/uL (ref 0.2–1.0)
Monocytes Relative: 6 %
NEUTROS PCT: 58 %
Neutro Abs: 2.5 10*3/uL (ref 1.4–6.5)
Platelets: 129 10*3/uL — ABNORMAL LOW (ref 150–440)
RBC: 4.62 MIL/uL (ref 4.40–5.90)
RDW: 13.6 % (ref 11.5–14.5)
WBC: 4.2 10*3/uL (ref 3.8–10.6)

## 2015-10-01 LAB — BASIC METABOLIC PANEL
Anion gap: 8 (ref 5–15)
BUN: 14 mg/dL (ref 6–20)
CHLORIDE: 101 mmol/L (ref 101–111)
CO2: 28 mmol/L (ref 22–32)
Calcium: 9.1 mg/dL (ref 8.9–10.3)
Creatinine, Ser: 1.01 mg/dL (ref 0.61–1.24)
GFR calc non Af Amer: >60 mL/min (ref >60)
Glucose, Bld: 81 mg/dL (ref 65–99)
POTASSIUM: 4 mmol/L (ref 3.5–5.1)
SODIUM: 137 mmol/L (ref 135–145)

## 2015-10-01 LAB — TROPONIN I: TROPONIN I: 0.03 ng/mL (ref <0.031)

## 2015-10-01 LAB — BRAIN NATRIURETIC PEPTIDE: B NATRIURETIC PEPTIDE 5: 44 pg/mL (ref 0.0–100.0)

## 2015-10-01 MED ORDER — HYDROCODONE-ACETAMINOPHEN 7.5-300 MG PO TABS: 1.0000 | ORAL_TABLET | Freq: Four times a day (QID) | ORAL | Status: DC | PRN

## 2015-10-01 MED ORDER — SODIUM CHLORIDE 0.9 % IV SOLN
Freq: Once | INTRAVENOUS | Status: DC
  Filled 2015-10-01: qty 1000

## 2015-10-01 MED ORDER — ALBUTEROL SULFATE (2.5 MG/3ML) 0.083% IN NEBU
INHALATION_SOLUTION | RESPIRATORY_TRACT | Status: AC
  Filled 2015-10-01: qty 3

## 2015-10-01 MED ORDER — ALBUTEROL SULFATE (2.5 MG/3ML) 0.083% IN NEBU
5.0000 mg | INHALATION_SOLUTION | Freq: Once | RESPIRATORY_TRACT | Status: AC
  Administered 2015-10-01: 5 mg via RESPIRATORY_TRACT
  Filled 2015-10-01: qty 6

## 2015-10-01 NOTE — ED Notes (Signed)
Pt to ER via POV under instruction of PCP c/o SOB that began last night. Pt has lung CA and is currently being treated. Pt in no RR distress. Denies CP.

## 2015-10-01 NOTE — Discharge Instructions (Signed)
Back Pain, Adult °Back pain is very common in adults. The cause of back pain is rarely dangerous and the pain often gets better over time. The cause of your back pain may not be known. Some common causes of back pain include: °· Strain of the muscles or ligaments supporting the spine. °· Wear and tear (degeneration) of the spinal disks. °· Arthritis. °· Direct injury to the back. °For many people, back pain may return. Since back pain is rarely dangerous, most people can learn to manage this condition on their own. °HOME CARE INSTRUCTIONS °Watch your back pain for any changes. The following actions may help to lessen any discomfort you are feeling: °· Remain active. It is stressful on your back to sit or stand in one place for long periods of time. Do not sit, drive, or stand in one place for more than 30 minutes at a time. Take short walks on even surfaces as soon as you are able. Try to increase the length of time you walk each day. °· Exercise regularly as directed by your health care provider. Exercise helps your back heal faster. It also helps avoid future injury by keeping your muscles strong and flexible. °· Do not stay in bed. Resting more than 1-2 days can delay your recovery. °· Pay attention to your body when you bend and lift. The most comfortable positions are those that put less stress on your recovering back. Always use proper lifting techniques, including: °· Bending your knees. °· Keeping the load close to your body. °· Avoiding twisting. °· Find a comfortable position to sleep. Use a firm mattress and lie on your side with your knees slightly bent. If you lie on your back, put a pillow under your knees. °· Avoid feeling anxious or stressed. Stress increases muscle tension and can worsen back pain. It is important to recognize when you are anxious or stressed and learn ways to manage it, such as with exercise. °· Take medicines only as directed by your health care provider. Over-the-counter  medicines to reduce pain and inflammation are often the most helpful. Your health care provider may prescribe muscle relaxant drugs. These medicines help dull your pain so you can more quickly return to your normal activities and healthy exercise. °· Apply ice to the injured area: °· Put ice in a plastic bag. °· Place a towel between your skin and the bag. °· Leave the ice on for 20 minutes, 2-3 times a day for the first 2-3 days. After that, ice and heat may be alternated to reduce pain and spasms. °· Maintain a healthy weight. Excess weight puts extra stress on your back and makes it difficult to maintain good posture. °SEEK MEDICAL CARE IF: °· You have pain that is not relieved with rest or medicine. °· You have increasing pain going down into the legs or buttocks. °· You have pain that does not improve in one week. °· You have night pain. °· You lose weight. °· You have a fever or chills. °SEEK IMMEDIATE MEDICAL CARE IF:  °· You develop new bowel or bladder control problems. °· You have unusual weakness or numbness in your arms or legs. °· You develop nausea or vomiting. °· You develop abdominal pain. °· You feel faint. °  °This information is not intended to replace advice given to you by your health care provider. Make sure you discuss any questions you have with your health care provider. °  °Document Released: 07/26/2005 Document Revised: 08/16/2014 Document Reviewed: 11/27/2013 °Elsevier Interactive Patient Education ©2016 Elsevier   Inc. Thoracic Strain A thoracic strain, which is sometimes called a mid-back strain, is an injury to the muscles or tendons that attach to the upper part of your back behind your chest. This type of injury occurs when a muscle is overstretched or overloaded.  Thoracic strains can range from mild to severe. Mild strains may involve stretching a muscle or tendon without tearing it. These injuries may heal in 1-2 weeks. More severe strains involve tearing of muscle fibers or  tendons. These will cause more pain and may take 6-8 weeks to heal. CAUSES This condition may be caused by:  An injury in which a sudden force is placed on the muscle.  Exercising without properly warming up.  Overuse of the muscle.  Improper form during certain movements.  Other injuries that surround or cause stress on the mid-back, causing a strain on the muscles. In some cases, the cause may not be known. RISK FACTORS This injury is more common in:  Athletes.  People with obesity. SYMPTOMS The main symptom of this condition is pain, especially with movement. Other symptoms include:  Bruising.  Swelling.  Spasm. DIAGNOSIS This condition may be diagnosed with a physical exam. X-rays may be taken to check for a fracture. TREATMENT This condition may be treated with:  Resting and icing the injured area.  Physical therapy. This will involve doing stretching and strengthening exercises.  Medicines for pain and inflammation. HOME CARE INSTRUCTIONS  Rest as needed. Follow instructions from your health care provider about any restrictions on activity.  If directed, apply ice to the injured area:  Put ice in a plastic bag.  Place a towel between your skin and the bag.  Leave the ice on for 20 minutes, 2-3 times per day.  Take over-the-counter and prescription medicines only as told by your health care provider.  Begin doing exercises as told by your health care provider or physical therapist.  Always warm up properly before physical activity or sports.  Bend your knees before you lift heavy objects.  Keep all follow-up visits as told by your health care provider. This is important. SEEK MEDICAL CARE IF:  Your pain is not helped by medicine.  Your pain, bruising, or swelling is getting worse.  You have a fever. SEEK IMMEDIATE MEDICAL CARE IF:  You have shortness of breath.  You have chest pain.  You develop numbness or weakness in your legs.  You  have involuntary loss of urine (urinary incontinence).   This information is not intended to replace advice given to you by your health care provider. Make sure you discuss any questions you have with your health care provider.   Document Released: 10/16/2003 Document Revised: 04/16/2015 Document Reviewed: 09/19/2014 Elsevier Interactive Patient Education [employer].

## 2015-10-01 NOTE — ED Provider Notes (Signed)
[facility] Emergency Department Provider Note     Time seen: ----------------------------------------- 11:01 AM on 10/01/2015 -----------------------------------------    I have reviewed the triage vital signs and the nursing notes.   HISTORY  Chief Complaint Shortness of Breath    HPI David [name] is a 60 y.o. male who presents to the ER for shortness of breath since last night. Patient reportedly has lung cancer and is currently under treatment here at the cancer center. Patient claims her "lung pain". Nothing makes his symptoms better particular Percocet. Pain seems to be one the right side of his chest.Patient notes he underwent radiation all last week.   Past Medical History  Diagnosis Date  . History of melanoma   . History of squamous cell carcinoma   . COPD (chronic obstructive pulmonary disease) ([place])   . Cancer [facility])     Patient Active Problem List   Diagnosis Date Noted  . 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 Skin graft      [facility]  . Peripheral vascular catheterization N/A 09/15/2015    Procedure: Glori Luis Cath Insertion;  Surgeon: Algernon Huxley, MD;  Location: [place] [facility];  Service: Cardiovascular;  Laterality: N/A;    Allergies Review of patient's allergies indicates no known allergies.  Social History Social History  Substance Use Topics  . Smoking status: Current Every Day Smoker -- 1.50 packs/day for 51 years    Types: Cigarettes    Start date: 08/12/1994  . Smokeless tobacco: Never Used  . Alcohol Use: No    Review of Systems Constitutional: Negative for fever. Eyes: Negative for visual changes. ENT: Negative for sore throat. Cardiovascular: Negative for chest pain Respiratory: Negative for shortness of breath currently Gastrointestinal: Negative for abdominal pain, vomiting and diarrhea. Genitourinary: Negative for dysuria. Musculoskeletal: Positive  for right-sided back pain Skin: Negative for rash. Neurological: Negative for headaches, focal weakness or numbness.  10-point ROS otherwise negative.  ____________________________________________   PHYSICAL EXAM:  VITAL SIGNS: ED Triage Vitals  Enc Vitals Group     BP 10/01/15 1041 137/75 mmHg     Pulse Rate 10/01/15 1041 77     Resp 10/01/15 1041 22     Temp 10/01/15 1041 97.7 F (36.5 C)     Temp Source 10/01/15 1041 Oral     SpO2 10/01/15 1041 100 %     Weight 10/01/15 1041 134 lb (60.782 kg)     Height 10/01/15 1041 '5\' 9"'$  (1.753 m)     Head Cir --      Peak Flow --      Pain Score 10/01/15 1041 7     Pain Loc --      Pain Edu? --      Excl. in [place]? --     Constitutional: Alert and oriented. Well appearing and in no distress. Eyes: Conjunctivae are normal. PERRL. Normal extraocular movements. ENT   Head: Normocephalic and atraumatic.   Nose: No congestion/rhinnorhea.   Mouth/Throat: Mucous membranes are moist.   Neck: No stridor. Cardiovascular: Normal rate, regular rhythm. Normal and symmetric distal pulses are present in all extremities. No murmurs, rubs, or gallops. Respiratory: Normal respiratory effort, no tachypnea, mostly clear to auscultation Gastrointestinal: Soft and nontender. No distention. No abdominal bruits.  Musculoskeletal: Nontender with normal range of motion in all extremities. No joint effusions.  No lower extremity tenderness nor edema. Right sided thoracic paraspinous tenderness Neurologic:  Normal speech and language. No gross focal neurologic deficits are appreciated. Speech is normal. No gait instability. Skin:  Skin is warm, dry and intact. No rash noted. Psychiatric: Mood and affect are normal. Speech and behavior are normal. Patient exhibits appropriate insight and judgment. ____________________________________________  EKG: Interpreted by me. Normal sinus rhythm with a rate of 77 bpm, normal PR interval, normal QRS, normal QT  interval. Normal axis.  ____________________________________________  ED COURSE:  Pertinent labs & imaging results that were available during my care of the patient were reviewed by me and considered in my medical decision making (see chart for details). Patient with lung cancer and dyspnea. We will check basic labs and x-ray. ____________________________________________    LABS (pertinent positives/negatives)  Labs Reviewed  CBC WITH DIFFERENTIAL/PLATELET - Abnormal; Notable for the following:    Platelets 129 (*)    All other components within normal limits  BASIC METABOLIC PANEL  BRAIN NATRIURETIC PEPTIDE  TROPONIN I    RADIOLOGY Images were viewed by me  Chest x-ray Is unremarkable ____________________________________________  FINAL ASSESSMENT AND PLAN  Thoracic strain  Plan: Patient with labs and imaging as dictated above. I'm doubtful that patient has a PE. Exam, x-ray, vitals are all normal. He is stable for follow-up with radiation oncology. He'll be discharged with Vicodin prescription   Earleen Newport, MD   Earleen Newport, MD 10/01/15 [phone]

## 2015-10-02 ENCOUNTER — Ambulatory Visit
Admission: RE | Admit: 2015-10-02 | Discharge: 2015-10-02 | Disposition: A | Discharge status: Home / Self Care | Payer: Medicaid Other | Source: Ambulatory Visit | Attending: Radiation Oncology | Admitting: Radiation Oncology

## 2015-10-02 DIAGNOSIS — Z510 Encounter for antineoplastic radiation therapy: Secondary | ICD-10-CM | POA: Diagnosis not present

## 2015-10-03 ENCOUNTER — Ambulatory Visit
Admission: RE | Admit: 2015-10-03 | Discharge: 2015-10-03 | Disposition: A | Discharge status: Home / Self Care | Payer: Medicaid Other | Source: Ambulatory Visit | Attending: Radiation Oncology | Admitting: Radiation Oncology

## 2015-10-03 DIAGNOSIS — Z510 Encounter for antineoplastic radiation therapy: Secondary | ICD-10-CM | POA: Diagnosis not present

## 2015-10-06 ENCOUNTER — Ambulatory Visit: Payer: Medicaid Other

## 2015-10-07 ENCOUNTER — Ambulatory Visit
Admission: RE | Admit: 2015-10-07 | Discharge: 2015-10-07 | Disposition: A | Discharge status: Home / Self Care | Payer: Medicaid Other | Source: Ambulatory Visit | Attending: Radiation Oncology | Admitting: Radiation Oncology

## 2015-10-07 DIAGNOSIS — Z510 Encounter for antineoplastic radiation therapy: Secondary | ICD-10-CM | POA: Diagnosis not present

## 2015-10-08 ENCOUNTER — Ambulatory Visit
Admission: RE | Admit: 2015-10-08 | Discharge: 2015-10-08 | Disposition: A | Discharge status: Home / Self Care | Payer: Medicaid Other | Source: Ambulatory Visit | Attending: Radiation Oncology | Admitting: Radiation Oncology

## 2015-10-08 DIAGNOSIS — Z510 Encounter for antineoplastic radiation therapy: Secondary | ICD-10-CM | POA: Diagnosis not present

## 2015-10-09 ENCOUNTER — Ambulatory Visit
Admission: RE | Admit: 2015-10-09 | Discharge: 2015-10-09 | Disposition: A | Discharge status: Home / Self Care | Payer: Medicaid Other | Source: Ambulatory Visit | Attending: Radiation Oncology | Admitting: Radiation Oncology

## 2015-10-09 DIAGNOSIS — Z510 Encounter for antineoplastic radiation therapy: Secondary | ICD-10-CM | POA: Diagnosis not present

## 2015-10-10 ENCOUNTER — Ambulatory Visit
Admission: RE | Admit: 2015-10-10 | Discharge: 2015-10-10 | Disposition: A | Discharge status: Home / Self Care | Payer: Medicaid Other | Source: Ambulatory Visit | Attending: Radiation Oncology | Admitting: Radiation Oncology

## 2015-10-10 DIAGNOSIS — Z510 Encounter for antineoplastic radiation therapy: Secondary | ICD-10-CM | POA: Diagnosis not present

## 2015-10-13 ENCOUNTER — Inpatient Hospital Stay: Payer: Medicaid Other | Attending: Oncology

## 2015-10-13 ENCOUNTER — Ambulatory Visit
Admission: RE | Admit: 2015-10-13 | Discharge: 2015-10-13 | Disposition: A | Discharge status: Home / Self Care | Payer: Medicaid Other | Source: Ambulatory Visit | Attending: Radiation Oncology | Admitting: Radiation Oncology

## 2015-10-13 ENCOUNTER — Inpatient Hospital Stay: Payer: Medicaid Other

## 2015-10-13 ENCOUNTER — Inpatient Hospital Stay (HOSPITAL_BASED_OUTPATIENT_CLINIC_OR_DEPARTMENT_OTHER): Payer: Medicaid Other | Admitting: Oncology

## 2015-10-13 VITALS — BP 96/66 | HR 90 | Temp 97.1°F | Resp 16 | Wt 136.5 lb

## 2015-10-13 DIAGNOSIS — T451X5S Adverse effect of antineoplastic and immunosuppressive drugs, sequela: Secondary | ICD-10-CM | POA: Insufficient documentation

## 2015-10-13 DIAGNOSIS — D701 Agranulocytosis secondary to cancer chemotherapy: Secondary | ICD-10-CM

## 2015-10-13 DIAGNOSIS — C801 Malignant (primary) neoplasm, unspecified: Secondary | ICD-10-CM

## 2015-10-13 DIAGNOSIS — F17210 Nicotine dependence, cigarettes, uncomplicated: Secondary | ICD-10-CM | POA: Diagnosis not present

## 2015-10-13 DIAGNOSIS — J449 Chronic obstructive pulmonary disease, unspecified: Secondary | ICD-10-CM | POA: Diagnosis not present

## 2015-10-13 DIAGNOSIS — C3491 Malignant neoplasm of unspecified part of right bronchus or lung: Secondary | ICD-10-CM

## 2015-10-13 DIAGNOSIS — Z79899 Other long term (current) drug therapy: Secondary | ICD-10-CM | POA: Diagnosis not present

## 2015-10-13 DIAGNOSIS — Z85820 Personal history of malignant melanoma of skin: Secondary | ICD-10-CM | POA: Insufficient documentation

## 2015-10-13 DIAGNOSIS — Z510 Encounter for antineoplastic radiation therapy: Secondary | ICD-10-CM | POA: Diagnosis not present

## 2015-10-13 LAB — CBC WITH DIFFERENTIAL/PLATELET
BASOS PCT: 1 %
Basophils Absolute: 0 10*3/uL (ref 0–0.1)
Eosinophils Absolute: 0.1 10*3/uL (ref 0–0.7)
Eosinophils Relative: 3 %
HEMATOCRIT: 35.9 % — AB (ref 40.0–52.0)
HEMOGLOBIN: 12.6 g/dL — AB (ref 13.0–18.0)
LYMPHS ABS: 1 10*3/uL (ref 1.0–3.6)
Lymphocytes Relative: 46 %
MCH: 31 pg (ref 26.0–34.0)
MCHC: 35.1 g/dL (ref 32.0–36.0)
MCV: 88.3 fL (ref 80.0–100.0)
MONO ABS: 0.7 10*3/uL (ref 0.2–1.0)
MONOS PCT: 33 %
NEUTROS ABS: 0.4 10*3/uL — AB (ref 1.4–6.5)
Neutrophils Relative %: 17 %
Platelets: 259 10*3/uL (ref 150–440)
RBC: 4.07 MIL/uL — ABNORMAL LOW (ref 4.40–5.90)
RDW: 14 % (ref 11.5–14.5)
WBC: 2.2 10*3/uL — ABNORMAL LOW (ref 3.8–10.6)

## 2015-10-13 LAB — COMPREHENSIVE METABOLIC PANEL
ALBUMIN: 3.9 g/dL (ref 3.5–5.0)
ALT: 11 U/L — ABNORMAL LOW (ref 17–63)
ANION GAP: 4 — AB (ref 5–15)
AST: 13 U/L — ABNORMAL LOW (ref 15–41)
Alkaline Phosphatase: 58 U/L (ref 38–126)
BUN: 18 mg/dL (ref 6–20)
CO2: 27 mmol/L (ref 22–32)
Calcium: 8.6 mg/dL — ABNORMAL LOW (ref 8.9–10.3)
Chloride: 102 mmol/L (ref 101–111)
Creatinine, Ser: 1.22 mg/dL (ref 0.61–1.24)
GFR calc Af Amer: >60 mL/min (ref >60)
GFR calc non Af Amer: >60 mL/min (ref >60)
GLUCOSE: 121 mg/dL — AB (ref 65–99)
POTASSIUM: 3.5 mmol/L (ref 3.5–5.1)
Sodium: 133 mmol/L — ABNORMAL LOW (ref 135–145)
TOTAL PROTEIN: 7.2 g/dL (ref 6.5–8.1)
Total Bilirubin: 0.3 mg/dL (ref 0.3–1.2)

## 2015-10-13 MED ORDER — HEPARIN SOD (PORK) LOCK FLUSH 100 UNIT/ML IV SOLN
500.0000 [IU] | Freq: Once | INTRAVENOUS | Status: AC
  Administered 2015-10-13: 500 [IU] via INTRAVENOUS
  Filled 2015-10-13: qty 5

## 2015-10-13 MED ORDER — OXYCODONE-ACETAMINOPHEN 5-325 MG PO TABS: 1.0000 | ORAL_TABLET | ORAL | Status: DC | PRN

## 2015-10-13 NOTE — Progress Notes (Signed)
Patient reports having pain that is 7/10 on pain scale and pain is all over.  Feeling more fatigued that usual.

## 2015-10-14 ENCOUNTER — Inpatient Hospital Stay: Payer: Medicaid Other

## 2015-10-14 ENCOUNTER — Ambulatory Visit: Payer: Medicaid Other

## 2015-10-15 ENCOUNTER — Inpatient Hospital Stay: Payer: Medicaid Other

## 2015-10-15 ENCOUNTER — Ambulatory Visit
Admission: RE | Admit: 2015-10-15 | Discharge: 2015-10-15 | Disposition: A | Discharge status: Home / Self Care | Payer: Medicaid Other | Source: Ambulatory Visit | Attending: Radiation Oncology | Admitting: Radiation Oncology

## 2015-10-15 DIAGNOSIS — Z510 Encounter for antineoplastic radiation therapy: Secondary | ICD-10-CM | POA: Diagnosis not present

## 2015-10-16 ENCOUNTER — Ambulatory Visit
Admission: RE | Admit: 2015-10-16 | Discharge: 2015-10-16 | Disposition: A | Discharge status: Home / Self Care | Payer: Medicaid Other | Source: Ambulatory Visit | Attending: Radiation Oncology | Admitting: Radiation Oncology

## 2015-10-16 DIAGNOSIS — Z510 Encounter for antineoplastic radiation therapy: Secondary | ICD-10-CM | POA: Diagnosis not present

## 2015-10-17 ENCOUNTER — Ambulatory Visit
Admission: RE | Admit: 2015-10-17 | Discharge: 2015-10-17 | Disposition: A | Discharge status: Home / Self Care | Payer: Medicaid Other | Source: Ambulatory Visit | Attending: Radiation Oncology | Admitting: Radiation Oncology

## 2015-10-17 DIAGNOSIS — Z510 Encounter for antineoplastic radiation therapy: Secondary | ICD-10-CM | POA: Diagnosis not present

## 2015-10-19 NOTE — Progress Notes (Signed)
[facility]  [phone]) [phone] Fax:(336) [phone]  ID: ZEPHANIAH LUBRANO OB: [birth year]  MR#: [id number]  TKW#:[id number]  Patient Care Team: No Pcp Per Patient as PCP - General (General Practice)  CHIEF COMPLAINT:  Chief Complaint  Patient presents with  . Lung Cancer    INTERVAL HISTORY: Patient returns to clinic today for further evaluation and consideration of cycle 2 of 4 of cisplatin and etoposide. He is still complaining of pain "all over", but otherwise feels well. He does not complain of cough or shortness of breath today. He is eating well. He has no neurologic complaints. He does not complain of any further weight loss. He denies any fevers. He denies any chest pain or hemoptysis. He denies any nausea, vomiting, constipation, or diarrhea. He has no urinary complaints. Patient offers no further specific complaints.  REVIEW OF SYSTEMS:   Review of Systems  Constitutional: Negative for fever, weight loss and malaise/fatigue.  Respiratory: Negative for cough, hemoptysis and shortness of breath.   Cardiovascular: Negative.  Negative for chest pain.  Gastrointestinal: Negative.   Musculoskeletal: Positive for joint pain.  Neurological: Negative.  Negative for weakness and headaches.    As per HPI. Otherwise, a complete review of systems is negatve.  PAST MEDICAL HISTORY: Past Medical History  Diagnosis Date  . History of melanoma   . History of squamous cell carcinoma   . COPD (chronic obstructive pulmonary disease) ([place])   . Cancer [facility])     PAST SURGICAL HISTORY: Past Surgical History  Procedure Laterality Date  . Skin graft      [facility]  . Peripheral vascular catheterization N/A 09/15/2015    Procedure: Glori Luis Cath Insertion;  Surgeon: Algernon Huxley, MD;  Location: [place] [facility];  Service: Cardiovascular;  Laterality: N/A;    FAMILY HISTORY: Reviewed and unchanged. No reported history of malignancy or chronic disease.     ADVANCED  DIRECTIVES:    HEALTH MAINTENANCE: Social History  Substance Use Topics  . Smoking status: Current Every Day Smoker -- 1.50 packs/day for 51 years    Types: Cigarettes    Start date: 08/12/1994  . Smokeless tobacco: Never Used  . Alcohol Use: No     Colonoscopy:  PAP:  Bone density:  Lipid panel:  No Known Allergies  Current Outpatient Prescriptions  Medication Sig Dispense Refill  . allopurinol (ZYLOPRIM) 300 MG tablet Take 1 tablet (300 mg total) by mouth daily. 30 tablet 0  . colchicine 0.6 MG tablet Take 2 tablets (1.2 mg) and then one hour later take on tablet (0.6 mg) 3 tablet 0  . Hydrocodone-Acetaminophen (VICODIN ES) 7.5-300 MG TABS Take 1 tablet by mouth 4 (four) times daily as needed. 20 each 0  . indomethacin (INDOCIN) 25 MG capsule Take 1 capsule by mouth 3 (three) times daily as needed.  0  . lidocaine-prilocaine (EMLA) cream Apply to affected area once 30 g 3  . ondansetron (ZOFRAN) 8 MG tablet Take 1 tablet (8 mg total) by mouth 2 (two) times daily as needed. Start on the third day after cisplatin chemotherapy. 30 tablet 1  . prochlorperazine (COMPAZINE) 10 MG tablet Take 1 tablet (10 mg total) by mouth every 6 (six) hours as needed (Nausea or vomiting). 30 tablet 1  . oxyCODONE-acetaminophen (PERCOCET/ROXICET) 5-325 MG tablet Take 1 tablet by mouth every 4 (four) hours as needed for severe pain. 30 tablet 0   No current facility-administered medications for this visit.   Facility-Administered Medications Ordered in Other  Visits  Medication Dose Route Frequency Provider Last Rate Last Dose  . 0.9 %  sodium chloride infusion   Intravenous Once Lloyd Huger, MD        OBJECTIVE: Filed Vitals:   10/13/15 1052  BP: 96/66  Pulse: 90  Temp: 97.1 F (36.2 C)  Resp: 16     Body mass index is 20.14 kg/(m^2).    ECOG FS:0 - Asymptomatic  General: Thin, no acute distress. Eyes: Pink conjunctiva, anicteric sclera. Lungs: Clear to auscultation  bilaterally. Heart: Regular rate and rhythm. No rubs, murmurs, or gallops. Abdomen: Soft, nontender, nondistended. No organomegaly noted, normoactive bowel sounds. Musculoskeletal: No edema, cyanosis, or clubbing. Neuro: Alert, answering all questions appropriately. Cranial nerves grossly intact. Skin: No rashes or petechiae noted. Psych: Normal affect.  LAB RESULTS:  Lab Results  Component Value Date   NA 133* 10/13/2015   K 3.5 10/13/2015   CL 102 10/13/2015   CO2 27 10/13/2015   GLUCOSE 121* 10/13/2015   BUN 18 10/13/2015   CREATININE 1.22 10/13/2015   CALCIUM 8.6* 10/13/2015   PROT 7.2 10/13/2015   ALBUMIN 3.9 10/13/2015   AST 13* 10/13/2015   ALT 11* 10/13/2015   ALKPHOS 58 10/13/2015   BILITOT 0.3 10/13/2015   GFRNONAA >60 10/13/2015   GFRAA >60 10/13/2015    Lab Results  Component Value Date   WBC 2.2* 10/13/2015   NEUTROABS 0.4* 10/13/2015   HGB 12.6* 10/13/2015   HCT 35.9* 10/13/2015   MCV 88.3 10/13/2015   PLT 259 10/13/2015     STUDIES: Mr Jeri Cos Wo Contrast  09-28-15  CLINICAL DATA:  Small-cell lung cancer, right. Initial staging. No reported neurological complaints. EXAM: MRI HEAD WITHOUT AND WITH CONTRAST TECHNIQUE: Multiplanar, multiecho pulse sequences of the brain and surrounding structures were obtained without and with intravenous contrast. CONTRAST:  40m MULTIHANCE GADOBENATE DIMEGLUMINE 529 MG/ML IV SOLN COMPARISON:  Head CT 12/05/2005 FINDINGS: The study is mildly motion degraded. There is no evidence of acute infarct, intracranial hemorrhage, mass, midline shift, or extra-axial fluid collection. Ventricles and sulci are normal. Scattered, punctate foci of T2 hyperintensity in the cerebral white matter bilaterally are nonspecific but compatible with minimal chronic small vessel ischemic disease. No abnormal enhancement is identified. Orbits are unremarkable. No significant inflammatory disease is seen in the paranasal sinuses are mastoid air cells.  Major intracranial vascular flow voids are preserved. IMPRESSION: No evidence of intracranial metastases. Electronically Signed   By: ALogan BoresM.D.   On: 019-Feb-[id number]   Dg Chest Port 1 View  10/01/2015  CLINICAL DATA:  Difficulty breathing today. Initial encounter. History of lung cancer. EXAM: PORTABLE CHEST 1 VIEW COMPARISON:  Single view of the chest 09/01/2015. PA and lateral chest 08/11/2015. CT chest 08/11/2015. FINDINGS: The lungs are markedly emphysematous. No consolidative process, pneumothorax or effusion is identified. Pulmonary nodule in the right upper lobe is seen as on the prior CT but not as clearly seen as on the 08/11/2015 exam. The left lung is clear. The costophrenic angles are off the margin of film. Heart size is normal. Port-A-Cath is in place. IMPRESSION: No acute disease. Emphysema. Pulmonary nodule seen on comparison plain films is more difficult to visualize today. Electronically Signed   By: TInge RiseM.D.   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  PLAN:    1. Small cell lung cancer: PET scan and pathology results reviewed independently. MRI the brain negative  for metastatic disease. Delay cycle 2 of cisplatin and etoposide secondary to neutropenia. Cannot use Neulasta at this time given concurrent XRT. Return to clinic in 1 week for reconsideration of cycle 2. Continue daily XRT. Will consider adding Neulasta once his XRT is completed. At the conclusion of his treatment, he will also require PCI.  2. Weight loss: Resolved. Likely multifactorial, unclear how much. Monitor. 3. History of melanoma: Patient is a poor historian but appears to have been greater than 3 years ago. Monitor. 4. Pain: Patient was given a prescription for Percocet today. Continue to monitor closely, there is suspicion of drug-seeking behavior.  5. Neutropenia: Secondary to chemotherapy. Delay treatment 1 week as above.  Patient expressed  understanding and was in agreement with this plan. He also understands that He can call clinic at any time with any questions, concerns, or complaints.   Lloyd Huger, MD   10/19/2015 8:31 AM

## 2015-10-20 ENCOUNTER — Inpatient Hospital Stay (HOSPITAL_BASED_OUTPATIENT_CLINIC_OR_DEPARTMENT_OTHER): Payer: Medicaid Other | Admitting: Oncology

## 2015-10-20 ENCOUNTER — Ambulatory Visit
Admission: RE | Admit: 2015-10-20 | Discharge: 2015-10-20 | Disposition: A | Discharge status: Home / Self Care | Payer: Medicaid Other | Source: Ambulatory Visit | Attending: Radiation Oncology | Admitting: Radiation Oncology

## 2015-10-20 ENCOUNTER — Inpatient Hospital Stay: Payer: Medicaid Other

## 2015-10-20 VITALS — BP 104/62 | HR 80 | Temp 97.6°F | Resp 16 | Wt 135.4 lb

## 2015-10-20 DIAGNOSIS — C3491 Malignant neoplasm of unspecified part of right bronchus or lung: Secondary | ICD-10-CM

## 2015-10-20 DIAGNOSIS — Z85820 Personal history of malignant melanoma of skin: Secondary | ICD-10-CM

## 2015-10-20 DIAGNOSIS — Z79899 Other long term (current) drug therapy: Secondary | ICD-10-CM

## 2015-10-20 DIAGNOSIS — F17210 Nicotine dependence, cigarettes, uncomplicated: Secondary | ICD-10-CM

## 2015-10-20 DIAGNOSIS — Z510 Encounter for antineoplastic radiation therapy: Secondary | ICD-10-CM | POA: Diagnosis not present

## 2015-10-20 LAB — COMPREHENSIVE METABOLIC PANEL
ALBUMIN: 3.6 g/dL (ref 3.5–5.0)
ALK PHOS: 59 U/L (ref 38–126)
ALT: 11 U/L — ABNORMAL LOW (ref 17–63)
ANION GAP: 5 (ref 5–15)
AST: 14 U/L — ABNORMAL LOW (ref 15–41)
BUN: 14 mg/dL (ref 6–20)
CHLORIDE: 104 mmol/L (ref 101–111)
CO2: 26 mmol/L (ref 22–32)
Calcium: 8.5 mg/dL — ABNORMAL LOW (ref 8.9–10.3)
Creatinine, Ser: 1.12 mg/dL (ref 0.61–1.24)
GFR calc non Af Amer: >60 mL/min (ref >60)
GLUCOSE: 126 mg/dL — AB (ref 65–99)
POTASSIUM: 3.8 mmol/L (ref 3.5–5.1)
SODIUM: 135 mmol/L (ref 135–145)
Total Bilirubin: 0.5 mg/dL (ref 0.3–1.2)
Total Protein: 6.9 g/dL (ref 6.5–8.1)

## 2015-10-20 LAB — CBC WITH DIFFERENTIAL/PLATELET
BASOS PCT: 1 %
Basophils Absolute: 0 10*3/uL (ref 0–0.1)
EOS ABS: 0 10*3/uL (ref 0–0.7)
Eosinophils Relative: 1 %
HCT: 35 % — ABNORMAL LOW (ref 40.0–52.0)
HEMOGLOBIN: 12.6 g/dL — AB (ref 13.0–18.0)
LYMPHS ABS: 1 10*3/uL (ref 1.0–3.6)
Lymphocytes Relative: 24 %
MCH: 31.4 pg (ref 26.0–34.0)
MCHC: 36 g/dL (ref 32.0–36.0)
MCV: 87.2 fL (ref 80.0–100.0)
MONOS PCT: 15 %
Monocytes Absolute: 0.7 10*3/uL (ref 0.2–1.0)
NEUTROS PCT: 59 %
Neutro Abs: 2.7 10*3/uL (ref 1.4–6.5)
PLATELETS: 207 10*3/uL (ref 150–440)
RBC: 4.01 MIL/uL — ABNORMAL LOW (ref 4.40–5.90)
RDW: 14.1 % (ref 11.5–14.5)
WBC: 4.4 10*3/uL (ref 3.8–10.6)

## 2015-10-20 MED ORDER — POTASSIUM CHLORIDE 2 MEQ/ML IV SOLN
Freq: Once | INTRAVENOUS | Status: AC
  Administered 2015-10-20: 11:00:00 via INTRAVENOUS
  Filled 2015-10-20: qty 1000

## 2015-10-20 MED ORDER — SODIUM CHLORIDE 0.9 % IV SOLN
80.0000 mg/m2 | Freq: Once | INTRAVENOUS | Status: AC
  Administered 2015-10-20: 139 mg via INTRAVENOUS
  Filled 2015-10-20: qty 139

## 2015-10-20 MED ORDER — PALONOSETRON HCL INJECTION 0.25 MG/5ML
0.2500 mg | Freq: Once | INTRAVENOUS | Status: AC
  Administered 2015-10-20: 0.25 mg via INTRAVENOUS
  Filled 2015-10-20: qty 5

## 2015-10-20 MED ORDER — SODIUM CHLORIDE 0.9 % IV SOLN
Freq: Once | INTRAVENOUS | Status: AC
  Administered 2015-10-20: 11:00:00 via INTRAVENOUS
  Filled 2015-10-20: qty 1000

## 2015-10-20 MED ORDER — SODIUM CHLORIDE 0.9% FLUSH
10.0000 mL | INTRAVENOUS | Status: DC | PRN
Start: 2015-10-20 — End: 2015-10-27
  Administered 2015-10-20: 10 mL
  Filled 2015-10-20: qty 10

## 2015-10-20 MED ORDER — SODIUM CHLORIDE 0.9 % IV SOLN
80.0000 mg/m2 | Freq: Once | INTRAVENOUS | Status: AC
  Administered 2015-10-20: 140 mg via INTRAVENOUS
  Filled 2015-10-20: qty 7

## 2015-10-20 MED ORDER — HEPARIN SOD (PORK) LOCK FLUSH 100 UNIT/ML IV SOLN
500.0000 [IU] | Freq: Once | INTRAVENOUS | Status: AC | PRN
  Administered 2015-10-20: 500 [IU]
  Filled 2015-10-20: qty 5

## 2015-10-20 MED ORDER — SODIUM CHLORIDE 0.9 % IV SOLN
Freq: Once | INTRAVENOUS | Status: AC
  Administered 2015-10-20: 14:00:00 via INTRAVENOUS
  Filled 2015-10-20: qty 5

## 2015-10-20 NOTE — Progress Notes (Signed)
Only problem patient mentions today is the back pain that is 6/10 on pain scale and requesting a refill on his percocet.

## 2015-10-21 ENCOUNTER — Ambulatory Visit
Admission: RE | Admit: 2015-10-21 | Discharge: 2015-10-21 | Disposition: A | Discharge status: Home / Self Care | Payer: Medicaid Other | Source: Ambulatory Visit | Attending: Radiation Oncology | Admitting: Radiation Oncology

## 2015-10-21 ENCOUNTER — Inpatient Hospital Stay: Payer: Medicaid Other

## 2015-10-21 VITALS — BP 119/69 | HR 60 | Temp 96.8°F | Resp 18

## 2015-10-21 DIAGNOSIS — C3491 Malignant neoplasm of unspecified part of right bronchus or lung: Secondary | ICD-10-CM

## 2015-10-21 DIAGNOSIS — Z510 Encounter for antineoplastic radiation therapy: Secondary | ICD-10-CM | POA: Diagnosis not present

## 2015-10-21 MED ORDER — SODIUM CHLORIDE 0.9 % IV SOLN
Freq: Once | INTRAVENOUS | Status: AC
  Administered 2015-10-21: 11:00:00 via INTRAVENOUS
  Filled 2015-10-21: qty 1000

## 2015-10-21 MED ORDER — SODIUM CHLORIDE 0.9 % IV SOLN
80.0000 mg/m2 | Freq: Once | INTRAVENOUS | Status: AC
  Administered 2015-10-21: 140 mg via INTRAVENOUS
  Filled 2015-10-21: qty 7

## 2015-10-21 MED ORDER — SODIUM CHLORIDE 0.9 % IV SOLN
10.0000 mg | Freq: Once | INTRAVENOUS | Status: AC
  Administered 2015-10-21: 10 mg via INTRAVENOUS
  Filled 2015-10-21: qty 1

## 2015-10-21 MED ORDER — SODIUM CHLORIDE 0.9% FLUSH
10.0000 mL | INTRAVENOUS | Status: DC | PRN
  Administered 2015-10-21: 10 mL
  Filled 2015-10-21: qty 10

## 2015-10-21 MED ORDER — HEPARIN SOD (PORK) LOCK FLUSH 100 UNIT/ML IV SOLN
500.0000 [IU] | Freq: Once | INTRAVENOUS | Status: AC | PRN
  Administered 2015-10-21: 500 [IU]
  Filled 2015-10-21: qty 5

## 2015-10-22 ENCOUNTER — Ambulatory Visit
Admission: RE | Admit: 2015-10-22 | Discharge: 2015-10-22 | Disposition: A | Discharge status: Home / Self Care | Payer: Medicaid Other | Source: Ambulatory Visit | Attending: Radiation Oncology | Admitting: Radiation Oncology

## 2015-10-22 ENCOUNTER — Inpatient Hospital Stay: Payer: Medicaid Other

## 2015-10-22 DIAGNOSIS — C3491 Malignant neoplasm of unspecified part of right bronchus or lung: Secondary | ICD-10-CM | POA: Diagnosis not present

## 2015-10-22 DIAGNOSIS — Z510 Encounter for antineoplastic radiation therapy: Secondary | ICD-10-CM | POA: Diagnosis not present

## 2015-10-22 MED ORDER — HEPARIN SOD (PORK) LOCK FLUSH 100 UNIT/ML IV SOLN
INTRAVENOUS | Status: AC
  Filled 2015-10-22: qty 5

## 2015-10-22 MED ORDER — HEPARIN SOD (PORK) LOCK FLUSH 100 UNIT/ML IV SOLN
500.0000 [IU] | Freq: Once | INTRAVENOUS | Status: AC | PRN
  Administered 2015-10-22: 500 [IU]

## 2015-10-22 MED ORDER — SODIUM CHLORIDE 0.9 % IV SOLN
Freq: Once | INTRAVENOUS | Status: AC
  Administered 2015-10-22: 12:00:00 via INTRAVENOUS
  Filled 2015-10-22: qty 1000

## 2015-10-22 MED ORDER — SODIUM CHLORIDE 0.9 % IV SOLN
80.0000 mg/m2 | Freq: Once | INTRAVENOUS | Status: AC
  Administered 2015-10-22: 140 mg via INTRAVENOUS
  Filled 2015-10-22: qty 7

## 2015-10-22 MED ORDER — SODIUM CHLORIDE 0.9 % IV SOLN
10.0000 mg | Freq: Once | INTRAVENOUS | Status: AC
  Administered 2015-10-22: 10 mg via INTRAVENOUS
  Filled 2015-10-22: qty 1

## 2015-10-23 ENCOUNTER — Ambulatory Visit
Admission: RE | Admit: 2015-10-23 | Discharge: 2015-10-23 | Disposition: A | Discharge status: Home / Self Care | Payer: Medicaid Other | Source: Ambulatory Visit | Attending: Radiation Oncology | Admitting: Radiation Oncology

## 2015-10-23 DIAGNOSIS — Z510 Encounter for antineoplastic radiation therapy: Secondary | ICD-10-CM | POA: Diagnosis not present

## 2015-10-24 ENCOUNTER — Ambulatory Visit
Admission: RE | Admit: 2015-10-24 | Discharge: 2015-10-24 | Disposition: A | Discharge status: Home / Self Care | Payer: Medicaid Other | Source: Ambulatory Visit | Attending: Radiation Oncology | Admitting: Radiation Oncology

## 2015-10-24 DIAGNOSIS — Z510 Encounter for antineoplastic radiation therapy: Secondary | ICD-10-CM | POA: Diagnosis not present

## 2015-10-26 NOTE — Progress Notes (Signed)
David [name]  Telephone:(336) [phone] Fax:(336) [phone]  ID: JUANDANIEL MANFREDO OB: October 16, 1955  MR#: [id number]  GDJ#:[id number]  Patient Care Team: No Pcp Per Patient as PCP - General (General Practice)  CHIEF COMPLAINT:  Chief Complaint  Patient presents with  . Lung Cancer    INTERVAL HISTORY: Patient returns to clinic today for further evaluation and reconsideration of cycle 2 of 4 of cisplatin and etoposide. He is still complaining of pain "all over", but otherwise feels well. He does not complain of cough or shortness of breath today. He is eating well. He has no neurologic complaints. He does not complain of any further weight loss. He denies any fevers. He denies any chest pain or hemoptysis. He denies any nausea, vomiting, constipation, or diarrhea. He has no urinary complaints. Patient offers no further specific complaints.  REVIEW OF SYSTEMS:   Review of Systems  Constitutional: Negative for fever, weight loss and malaise/fatigue.  Respiratory: Negative for cough, hemoptysis and shortness of breath.   Cardiovascular: Negative.  Negative for chest pain.  Gastrointestinal: Negative.   Musculoskeletal: Positive for joint pain.  Neurological: Negative.  Negative for weakness and headaches.    As per HPI. Otherwise, a complete review of systems is negatve.  PAST MEDICAL HISTORY: Past Medical History  Diagnosis Date  . History of melanoma   . History of squamous cell carcinoma   . COPD (chronic obstructive pulmonary disease) ([place])   . Cancer [facility])     PAST SURGICAL HISTORY: Past Surgical History  Procedure Laterality Date  . Skin graft      [facility]  . Peripheral vascular catheterization N/A 09/15/2015    Procedure: Glori Luis Cath Insertion;  Surgeon: Algernon Huxley, MD;  Location: [place] [facility];  Service: Cardiovascular;  Laterality: N/A;    FAMILY HISTORY: Reviewed and unchanged. No reported history of malignancy or chronic disease.     ADVANCED  DIRECTIVES:    HEALTH MAINTENANCE: Social History  Substance Use Topics  . Smoking status: Current Every Day Smoker -- 1.50 packs/day for 51 years    Types: Cigarettes    Start date: 08/12/1994  . Smokeless tobacco: Never Used  . Alcohol Use: No     Colonoscopy:  PAP:  Bone density:  Lipid panel:  No Known Allergies  Current Outpatient Prescriptions  Medication Sig Dispense Refill  . allopurinol (ZYLOPRIM) 300 MG tablet Take 1 tablet (300 mg total) by mouth daily. 30 tablet 0  . colchicine 0.6 MG tablet Take 2 tablets (1.2 mg) and then one hour later take on tablet (0.6 mg) 3 tablet 0  . indomethacin (INDOCIN) 25 MG capsule Take 1 capsule by mouth 3 (three) times daily as needed.  0  . lidocaine-prilocaine (EMLA) cream Apply to affected area once 30 g 3  . ondansetron (ZOFRAN) 8 MG tablet Take 1 tablet (8 mg total) by mouth 2 (two) times daily as needed. Start on the third day after cisplatin chemotherapy. 30 tablet 1  . oxyCODONE-acetaminophen (PERCOCET/ROXICET) 5-325 MG tablet Take 1 tablet by mouth every 4 (four) hours as needed for severe pain. 30 tablet 0  . prochlorperazine (COMPAZINE) 10 MG tablet Take 1 tablet (10 mg total) by mouth every 6 (six) hours as needed (Nausea or vomiting). 30 tablet 1   No current facility-administered medications for this visit.   Facility-Administered Medications Ordered in Other Visits  Medication Dose Route Frequency Provider Last Rate Last Dose  . 0.9 %  sodium chloride infusion   Intravenous  Once Lloyd Huger, MD      . sodium chloride flush (NS) 0.9 % injection 10 mL  10 mL Intracatheter PRN Lloyd Huger, MD   10 mL at 10/20/15 1000    OBJECTIVE: Filed Vitals:   10/20/15 1102  BP: 104/62  Pulse: 80  Temp: 97.6 F (36.4 C)  Resp: 16     Body mass index is 19.98 kg/(m^2).    ECOG FS:0 - Asymptomatic  General: Thin, no acute distress. Eyes: Pink conjunctiva, anicteric sclera. Lungs: Clear to auscultation  bilaterally. Heart: Regular rate and rhythm. No rubs, murmurs, or gallops. Abdomen: Soft, nontender, nondistended. No organomegaly noted, normoactive bowel sounds. Musculoskeletal: No edema, cyanosis, or clubbing. Neuro: Alert, answering all questions appropriately. Cranial nerves grossly intact. Skin: No rashes or petechiae noted. Psych: Normal affect.  LAB RESULTS:  Lab Results  Component Value Date   NA 135 10/20/2015   K 3.8 10/20/2015   CL 104 10/20/2015   CO2 26 10/20/2015   GLUCOSE 126* 10/20/2015   BUN 14 10/20/2015   CREATININE 1.12 10/20/2015   CALCIUM 8.5* 10/20/2015   PROT 6.9 10/20/2015   ALBUMIN 3.6 10/20/2015   AST 14* 10/20/2015   ALT 11* 10/20/2015   ALKPHOS 59 10/20/2015   BILITOT 0.5 10/20/2015   GFRNONAA >60 10/20/2015   GFRAA >60 10/20/2015    Lab Results  Component Value Date   WBC 4.4 10/20/2015   NEUTROABS 2.7 10/20/2015   HGB 12.6* 10/20/2015   HCT 35.0* 10/20/2015   MCV 87.2 10/20/2015   PLT 207 10/20/2015     STUDIES: Dg Chest Port 1 View  10/01/2015  CLINICAL DATA:  Difficulty breathing today. Initial encounter. History of lung cancer. EXAM: PORTABLE CHEST 1 VIEW COMPARISON:  Single view of the chest 09/01/2015. PA and lateral chest 08/11/2015. CT chest 08/11/2015. FINDINGS: The lungs are markedly emphysematous. No consolidative process, pneumothorax or effusion is identified. Pulmonary nodule in the right upper lobe is seen as on the prior CT but not as clearly seen as on the 08/11/2015 exam. The left lung is clear. The costophrenic angles are off the margin of film. Heart size is normal. Port-A-Cath is in place. IMPRESSION: No acute disease. Emphysema. Pulmonary nodule seen on comparison plain films is more difficult to visualize today. Electronically Signed   By: Inge Rise M.D.   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  PLAN:    1. Small cell lung cancer: PET scan and pathology  results reviewed independently. MRI the brain negative for metastatic disease. Patient's white blood cell count has recovered, therefore will proceed with cycle 2 of cisplatin and etoposide today. Cannot use Neulasta at this time given concurrent XRT. Return to clinic in 3 weeks for consideration of cycle 3. Continue daily XRT. Will consider adding Neulasta once his XRT is completed. At the conclusion of his treatment, he will also require PCI.  2. Weight loss: Resolved. Likely multifactorial, unclear how much. Monitor. 3. History of melanoma: Patient is a poor historian but appears to have been greater than 3 years ago. Monitor. 4. Pain: Patient was given a prescription for Percocet last week. Continue to monitor closely, there is suspicion of drug-seeking behavior.  5. Neutropenia: Secondary to chemotherapy. Resolved.  Patient expressed understanding and was in agreement with this plan. He also understands that He can call clinic at any time with any questions, concerns, or complaints.   Lloyd Huger, MD  10/26/2015 8:51 AM

## 2015-10-27 ENCOUNTER — Ambulatory Visit
Admission: RE | Admit: 2015-10-27 | Discharge: 2015-10-27 | Disposition: A | Discharge status: Home / Self Care | Payer: Medicaid Other | Source: Ambulatory Visit | Attending: Radiation Oncology | Admitting: Radiation Oncology

## 2015-10-27 DIAGNOSIS — Z510 Encounter for antineoplastic radiation therapy: Secondary | ICD-10-CM | POA: Diagnosis not present

## 2015-10-28 ENCOUNTER — Ambulatory Visit
Admission: RE | Admit: 2015-10-28 | Discharge: 2015-10-28 | Disposition: A | Discharge status: Home / Self Care | Payer: Medicaid Other | Source: Ambulatory Visit | Attending: Radiation Oncology | Admitting: Radiation Oncology

## 2015-10-28 DIAGNOSIS — Z510 Encounter for antineoplastic radiation therapy: Secondary | ICD-10-CM | POA: Diagnosis not present

## 2015-10-29 ENCOUNTER — Ambulatory Visit: Payer: Medicaid Other

## 2015-10-30 ENCOUNTER — Ambulatory Visit
Admission: RE | Admit: 2015-10-30 | Discharge: 2015-10-30 | Disposition: A | Discharge status: Home / Self Care | Payer: Medicaid Other | Source: Ambulatory Visit | Attending: Radiation Oncology | Admitting: Radiation Oncology

## 2015-10-30 DIAGNOSIS — Z510 Encounter for antineoplastic radiation therapy: Secondary | ICD-10-CM | POA: Diagnosis not present

## 2015-10-31 ENCOUNTER — Ambulatory Visit
Admission: RE | Admit: 2015-10-31 | Discharge: 2015-10-31 | Disposition: A | Discharge status: Home / Self Care | Payer: Medicaid Other | Source: Ambulatory Visit | Attending: Radiation Oncology | Admitting: Radiation Oncology

## 2015-10-31 DIAGNOSIS — Z510 Encounter for antineoplastic radiation therapy: Secondary | ICD-10-CM | POA: Diagnosis not present

## 2015-11-03 ENCOUNTER — Ambulatory Visit: Payer: Medicaid Other

## 2015-11-04 ENCOUNTER — Ambulatory Visit: Payer: Medicaid Other

## 2015-11-04 DIAGNOSIS — Z510 Encounter for antineoplastic radiation therapy: Secondary | ICD-10-CM | POA: Diagnosis not present

## 2015-11-05 ENCOUNTER — Ambulatory Visit: Payer: Medicaid Other

## 2015-11-05 DIAGNOSIS — Z510 Encounter for antineoplastic radiation therapy: Secondary | ICD-10-CM | POA: Diagnosis not present

## 2015-11-06 ENCOUNTER — Ambulatory Visit: Payer: Medicaid Other

## 2015-11-06 ENCOUNTER — Ambulatory Visit
Admission: RE | Admit: 2015-11-06 | Discharge: 2015-11-06 | Disposition: A | Discharge status: Home / Self Care | Payer: Medicaid Other | Source: Ambulatory Visit | Attending: Radiation Oncology | Admitting: Radiation Oncology

## 2015-11-06 DIAGNOSIS — Z510 Encounter for antineoplastic radiation therapy: Secondary | ICD-10-CM | POA: Diagnosis not present

## 2015-11-07 ENCOUNTER — Ambulatory Visit: Payer: Medicaid Other

## 2015-11-10 ENCOUNTER — Inpatient Hospital Stay: Payer: Medicaid Other | Attending: Oncology

## 2015-11-10 ENCOUNTER — Inpatient Hospital Stay (HOSPITAL_BASED_OUTPATIENT_CLINIC_OR_DEPARTMENT_OTHER): Payer: Medicaid Other | Admitting: Oncology

## 2015-11-10 ENCOUNTER — Ambulatory Visit
Admission: RE | Admit: 2015-11-10 | Discharge: 2015-11-10 | Disposition: A | Discharge status: Home / Self Care | Payer: Medicaid Other | Source: Ambulatory Visit | Attending: Radiation Oncology | Admitting: Radiation Oncology

## 2015-11-10 ENCOUNTER — Inpatient Hospital Stay: Payer: Medicaid Other

## 2015-11-10 VITALS — BP 124/73 | HR 93 | Temp 97.4°F | Resp 16 | Wt 134.5 lb

## 2015-11-10 DIAGNOSIS — D709 Neutropenia, unspecified: Secondary | ICD-10-CM

## 2015-11-10 DIAGNOSIS — Z5111 Encounter for antineoplastic chemotherapy: Secondary | ICD-10-CM | POA: Diagnosis not present

## 2015-11-10 DIAGNOSIS — M2550 Pain in unspecified joint: Secondary | ICD-10-CM

## 2015-11-10 DIAGNOSIS — R634 Abnormal weight loss: Secondary | ICD-10-CM | POA: Insufficient documentation

## 2015-11-10 DIAGNOSIS — C3491 Malignant neoplasm of unspecified part of right bronchus or lung: Secondary | ICD-10-CM | POA: Diagnosis not present

## 2015-11-10 DIAGNOSIS — Z79899 Other long term (current) drug therapy: Secondary | ICD-10-CM | POA: Diagnosis not present

## 2015-11-10 DIAGNOSIS — J449 Chronic obstructive pulmonary disease, unspecified: Secondary | ICD-10-CM

## 2015-11-10 DIAGNOSIS — F17210 Nicotine dependence, cigarettes, uncomplicated: Secondary | ICD-10-CM | POA: Insufficient documentation

## 2015-11-10 DIAGNOSIS — C801 Malignant (primary) neoplasm, unspecified: Secondary | ICD-10-CM

## 2015-11-10 DIAGNOSIS — R630 Anorexia: Secondary | ICD-10-CM | POA: Diagnosis not present

## 2015-11-10 DIAGNOSIS — Z85828 Personal history of other malignant neoplasm of skin: Secondary | ICD-10-CM | POA: Insufficient documentation

## 2015-11-10 DIAGNOSIS — Z7689 Persons encountering health services in other specified circumstances: Secondary | ICD-10-CM

## 2015-11-10 DIAGNOSIS — Z510 Encounter for antineoplastic radiation therapy: Secondary | ICD-10-CM | POA: Diagnosis not present

## 2015-11-10 LAB — CBC WITH DIFFERENTIAL/PLATELET
Basophils Absolute: 0 K/uL (ref 0–0.1)
Basophils Relative: 1 %
Eosinophils Absolute: 0.1 K/uL (ref 0–0.7)
Eosinophils Relative: 6 %
HCT: 31.4 % — ABNORMAL LOW (ref 40.0–52.0)
Hemoglobin: 11.2 g/dL — ABNORMAL LOW (ref 13.0–18.0)
Lymphocytes Relative: 36 %
Lymphs Abs: 0.6 K/uL — ABNORMAL LOW (ref 1.0–3.6)
MCH: 31.7 pg (ref 26.0–34.0)
MCHC: 35.8 g/dL (ref 32.0–36.0)
MCV: 88.6 fL (ref 80.0–100.0)
Monocytes Absolute: 0.5 K/uL (ref 0.2–1.0)
Monocytes Relative: 33 %
Neutro Abs: 0.4 K/uL — ABNORMAL LOW (ref 1.7–7.7)
Neutrophils Relative %: 24 %
Platelets: 285 K/uL (ref 150–440)
RBC: 3.54 MIL/uL — ABNORMAL LOW (ref 4.40–5.90)
RDW: 15.4 % — ABNORMAL HIGH (ref 11.5–14.5)
WBC: 1.6 K/uL — ABNORMAL LOW (ref 3.8–10.6)

## 2015-11-10 LAB — COMPREHENSIVE METABOLIC PANEL
ALT: 10 U/L — AB (ref 17–63)
AST: 18 U/L (ref 15–41)
Albumin: 3.6 g/dL (ref 3.5–5.0)
Alkaline Phosphatase: 59 U/L (ref 38–126)
Anion gap: 5 (ref 5–15)
BILIRUBIN TOTAL: 0.4 mg/dL (ref 0.3–1.2)
BUN: 15 mg/dL (ref 6–20)
CALCIUM: 8.5 mg/dL — AB (ref 8.9–10.3)
CO2: 26 mmol/L (ref 22–32)
CREATININE: 1.08 mg/dL (ref 0.61–1.24)
Chloride: 106 mmol/L (ref 101–111)
GFR calc Af Amer: >60 mL/min (ref >60)
Glucose, Bld: 149 mg/dL — ABNORMAL HIGH (ref 65–99)
Potassium: 3.5 mmol/L (ref 3.5–5.1)
Sodium: 137 mmol/L (ref 135–145)
TOTAL PROTEIN: 6.6 g/dL (ref 6.5–8.1)

## 2015-11-10 MED ORDER — DOXYCYCLINE HYCLATE 100 MG PO CAPS: 100.0000 mg | ORAL_CAPSULE | Freq: Two times a day (BID) | ORAL | Status: DC

## 2015-11-10 MED ORDER — HEPARIN SOD (PORK) LOCK FLUSH 100 UNIT/ML IV SOLN
INTRAVENOUS | Status: AC
  Filled 2015-11-10: qty 5

## 2015-11-10 MED ORDER — OXYCODONE-ACETAMINOPHEN 5-325 MG PO TABS
1.0000 | ORAL_TABLET | ORAL | Status: DC | PRN
Start: 2015-11-10 — End: 2015-11-23

## 2015-11-10 MED ORDER — HEPARIN SOD (PORK) LOCK FLUSH 100 UNIT/ML IV SOLN
500.0000 [IU] | Freq: Once | INTRAVENOUS | Status: AC
  Administered 2015-11-10: 500 [IU] via INTRAVENOUS

## 2015-11-10 MED ORDER — DOXYCYCLINE HYCLATE 100 MG PO TBEC: 100.0000 mg | DELAYED_RELEASE_TABLET | Freq: Two times a day (BID) | ORAL | Status: DC

## 2015-11-10 MED ORDER — SODIUM CHLORIDE 0.9% FLUSH
10.0000 mL | Freq: Once | INTRAVENOUS | Status: AC
  Administered 2015-11-10: 10 mL via INTRAVENOUS
  Filled 2015-11-10: qty 10

## 2015-11-10 NOTE — Progress Notes (Signed)
Patient's appetite is now decreasing.  He is having a cough with occasional SOBr.  Reports that his right side back pain is 8/10 on pain scale and the Percocet rx he is given only lasts about 8 days because he has to take 4 QD to control the pain.

## 2015-11-11 ENCOUNTER — Inpatient Hospital Stay: Payer: Medicaid Other

## 2015-11-11 ENCOUNTER — Ambulatory Visit: Payer: Medicaid Other

## 2015-11-12 ENCOUNTER — Ambulatory Visit
Admission: RE | Admit: 2015-11-12 | Discharge: 2015-11-12 | Disposition: A | Discharge status: Home / Self Care | Payer: Medicaid Other | Source: Ambulatory Visit | Attending: Radiation Oncology | Admitting: Radiation Oncology

## 2015-11-12 ENCOUNTER — Ambulatory Visit: Payer: Medicaid Other

## 2015-11-12 ENCOUNTER — Inpatient Hospital Stay: Payer: Medicaid Other

## 2015-11-12 DIAGNOSIS — Z510 Encounter for antineoplastic radiation therapy: Secondary | ICD-10-CM | POA: Diagnosis not present

## 2015-11-13 ENCOUNTER — Ambulatory Visit
Admission: RE | Admit: 2015-11-13 | Discharge: 2015-11-13 | Disposition: A | Discharge status: Home / Self Care | Payer: Medicaid Other | Source: Ambulatory Visit | Attending: Radiation Oncology | Admitting: Radiation Oncology

## 2015-11-13 ENCOUNTER — Ambulatory Visit: Payer: Medicaid Other

## 2015-11-13 DIAGNOSIS — Z510 Encounter for antineoplastic radiation therapy: Secondary | ICD-10-CM | POA: Diagnosis not present

## 2015-11-16 NOTE — Progress Notes (Signed)
[place]  Telephone:(336) [phone] Fax:(336) [phone]  ID: David [name] OB: May 29, 1956  MR#: [id number]  AOZ#:[id number]  Patient Care Team: No Pcp Per Patient as PCP - General (General Practice)  CHIEF COMPLAINT:  Chief Complaint  Patient presents with  . Lung Cancer    INTERVAL HISTORY: Patient returns to clinic today for further evaluation and consideration of cycle 3 of 4 of cisplatin and etoposide. He is still complaining of pain "all over", but otherwise feels well. He admits to a decreased appetite. He also has cough with occasional short of breath. He has no neurologic complaints. He denies any fevers. He denies any chest pain or hemoptysis. He denies any nausea, vomiting, constipation, or diarrhea. He has no urinary complaints. Patient offers no further specific complaints.  REVIEW OF SYSTEMS:   Review of Systems  Constitutional: Negative for fever, weight loss and malaise/fatigue.  Respiratory: Positive for cough and shortness of breath. Negative for hemoptysis.   Cardiovascular: Negative.  Negative for chest pain.  Gastrointestinal: Negative.   Musculoskeletal: Positive for joint pain.  Neurological: Negative.  Negative for weakness and headaches.    As per HPI. Otherwise, a complete review of systems is negatve.  PAST MEDICAL HISTORY: Past Medical History  Diagnosis Date  . History of melanoma   . History of squamous cell carcinoma   . COPD (chronic obstructive pulmonary disease) ([place])   . Cancer [facility])     PAST SURGICAL HISTORY: Past Surgical History  Procedure Laterality Date  . Skin graft      [facility]  . Peripheral vascular catheterization N/A 09/15/2015    Procedure: Glori Luis Cath Insertion;  Surgeon: Algernon Huxley, MD;  Location: [place] [facility];  Service: Cardiovascular;  Laterality: N/A;    FAMILY HISTORY: Reviewed and unchanged. No reported history of malignancy or chronic disease.     ADVANCED DIRECTIVES:    HEALTH  MAINTENANCE: Social History  Substance Use Topics  . Smoking status: Current Every Day Smoker -- 1.50 packs/day for 51 years    Types: Cigarettes    Start date: 08/12/1994  . Smokeless tobacco: Never Used  . Alcohol Use: No     Colonoscopy:  PAP:  Bone density:  Lipid panel:  No Known Allergies  Current Outpatient Prescriptions  Medication Sig Dispense Refill  . colchicine 0.6 MG tablet Take 2 tablets (1.2 mg) and then one hour later take on tablet (0.6 mg) 3 tablet 0  . indomethacin (INDOCIN) 25 MG capsule Take 1 capsule by mouth 3 (three) times daily as needed.  0  . lidocaine-prilocaine (EMLA) cream Apply to affected area once 30 g 3  . ondansetron (ZOFRAN) 8 MG tablet Take 1 tablet (8 mg total) by mouth 2 (two) times daily as needed. Start on the third day after cisplatin chemotherapy. 30 tablet 1  . oxyCODONE-acetaminophen (PERCOCET/ROXICET) 5-325 MG tablet Take 1 tablet by mouth every 4 (four) hours as needed for severe pain. 40 tablet 0  . prochlorperazine (COMPAZINE) 10 MG tablet Take 1 tablet (10 mg total) by mouth every 6 (six) hours as needed (Nausea or vomiting). 30 tablet 1  . doxycycline (VIBRAMYCIN) 100 MG capsule Take 1 capsule (100 mg total) by mouth 2 (two) times daily. 14 capsule 0   No current facility-administered medications for this visit.   Facility-Administered Medications Ordered in Other Visits  Medication Dose Route Frequency Provider Last Rate Last Dose  . 0.9 %  sodium chloride infusion   Intravenous Once Lloyd Huger,  MD        OBJECTIVE: Filed Vitals:   11/10/15 0945  BP: 124/73  Pulse: 93  Temp: 97.4 F (36.3 C)  Resp: 16     Body mass index is 19.85 kg/(m^2).    ECOG FS:0 - Asymptomatic  General: Thin, no acute distress. Eyes: Pink conjunctiva, anicteric sclera. Lungs: Clear to auscultation bilaterally. Heart: Regular rate and rhythm. No rubs, murmurs, or gallops. Abdomen: Soft, nontender, nondistended. No organomegaly noted,  normoactive bowel sounds. Musculoskeletal: No edema, cyanosis, or clubbing. Neuro: Alert, answering all questions appropriately. Cranial nerves grossly intact. Skin: No rashes or petechiae noted. Psych: Normal affect.  LAB RESULTS:  Lab Results  Component Value Date   NA 137 11/10/2015   K 3.5 11/10/2015   CL 106 11/10/2015   CO2 26 11/10/2015   GLUCOSE 149* 11/10/2015   BUN 15 11/10/2015   CREATININE 1.08 11/10/2015   CALCIUM 8.5* 11/10/2015   PROT 6.6 11/10/2015   ALBUMIN 3.6 11/10/2015   AST 18 11/10/2015   ALT 10* 11/10/2015   ALKPHOS 59 11/10/2015   BILITOT 0.4 11/10/2015   GFRNONAA >60 11/10/2015   GFRAA >60 11/10/2015    Lab Results  Component Value Date   WBC 1.6* 11/10/2015   NEUTROABS 0.4* 11/10/2015   HGB 11.2* 11/10/2015   HCT 31.4* 11/10/2015   MCV 88.6 11/10/2015   PLT 285 11/10/2015     STUDIES: No results found.  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  PLAN:    1. Small cell lung cancer: PET scan and pathology results reviewed independently. MRI the brain negative for metastatic disease. Delay cycle 3 of cisplatin and etoposide today secondary to neutropenia. Now the patient has concluded his XRT, can add Neulasta with the remainder of his treatments. Return to clinic in 1 week for reconsideration of cycle 3. Plan to reimage at the conclusion of cycle 4.  At the conclusion of his treatment, he will also require PCI.  2. Weight loss: Resolved. Likely multifactorial, unclear how much. Monitor. 3. History of melanoma: Patient is a poor historian but appears to have been greater than 3 years ago. Monitor. 4. Pain: Patient was given a prescription for Percocet recently. Continue to monitor closely, there is suspicion of drug-seeking behavior.  5. Neutropenia: Secondary to chemotherapy. Delay treatment as above and added Neulasta.  Patient expressed understanding and was in agreement with this plan. He also understands that He can  call clinic at any time with any questions, concerns, or complaints.   Lloyd Huger, MD   11/16/2015 10:43 AM

## 2015-11-17 ENCOUNTER — Inpatient Hospital Stay: Payer: Medicaid Other

## 2015-11-17 ENCOUNTER — Inpatient Hospital Stay (HOSPITAL_BASED_OUTPATIENT_CLINIC_OR_DEPARTMENT_OTHER): Payer: Medicaid Other | Admitting: Oncology

## 2015-11-17 VITALS — BP 110/71 | HR 94 | Temp 96.2°F | Resp 16 | Wt 134.0 lb

## 2015-11-17 DIAGNOSIS — J449 Chronic obstructive pulmonary disease, unspecified: Secondary | ICD-10-CM

## 2015-11-17 DIAGNOSIS — C3491 Malignant neoplasm of unspecified part of right bronchus or lung: Secondary | ICD-10-CM

## 2015-11-17 DIAGNOSIS — R634 Abnormal weight loss: Secondary | ICD-10-CM | POA: Diagnosis not present

## 2015-11-17 DIAGNOSIS — F17210 Nicotine dependence, cigarettes, uncomplicated: Secondary | ICD-10-CM

## 2015-11-17 DIAGNOSIS — Z5111 Encounter for antineoplastic chemotherapy: Secondary | ICD-10-CM | POA: Diagnosis not present

## 2015-11-17 DIAGNOSIS — R630 Anorexia: Secondary | ICD-10-CM

## 2015-11-17 DIAGNOSIS — Z79899 Other long term (current) drug therapy: Secondary | ICD-10-CM

## 2015-11-17 DIAGNOSIS — M2550 Pain in unspecified joint: Secondary | ICD-10-CM

## 2015-11-17 DIAGNOSIS — Z85828 Personal history of other malignant neoplasm of skin: Secondary | ICD-10-CM

## 2015-11-17 DIAGNOSIS — Z7689 Persons encountering health services in other specified circumstances: Secondary | ICD-10-CM | POA: Diagnosis not present

## 2015-11-17 LAB — COMPREHENSIVE METABOLIC PANEL
ALBUMIN: 3.9 g/dL (ref 3.5–5.0)
ALT: 10 U/L — ABNORMAL LOW (ref 17–63)
ANION GAP: 5 (ref 5–15)
AST: 17 U/L (ref 15–41)
Alkaline Phosphatase: 63 U/L (ref 38–126)
BILIRUBIN TOTAL: 0.5 mg/dL (ref 0.3–1.2)
BUN: 16 mg/dL (ref 6–20)
CALCIUM: 9 mg/dL (ref 8.9–10.3)
CO2: 27 mmol/L (ref 22–32)
Chloride: 104 mmol/L (ref 101–111)
Creatinine, Ser: 1.14 mg/dL (ref 0.61–1.24)
GLUCOSE: 145 mg/dL — AB (ref 65–99)
POTASSIUM: 3.4 mmol/L — AB (ref 3.5–5.1)
Sodium: 136 mmol/L (ref 135–145)
TOTAL PROTEIN: 7.3 g/dL (ref 6.5–8.1)

## 2015-11-17 LAB — CBC WITH DIFFERENTIAL/PLATELET
BASOS PCT: 1 %
Basophils Absolute: 0 10*3/uL (ref 0–0.1)
EOS ABS: 0.1 10*3/uL (ref 0–0.7)
Eosinophils Relative: 2 %
HCT: 34.9 % — ABNORMAL LOW (ref 40.0–52.0)
HEMOGLOBIN: 12.4 g/dL — AB (ref 13.0–18.0)
Lymphocytes Relative: 15 %
Lymphs Abs: 0.8 10*3/uL — ABNORMAL LOW (ref 1.0–3.6)
MCH: 31.5 pg (ref 26.0–34.0)
MCHC: 35.5 g/dL (ref 32.0–36.0)
MCV: 88.7 fL (ref 80.0–100.0)
Monocytes Absolute: 0.7 10*3/uL (ref 0.2–1.0)
Monocytes Relative: 15 %
NEUTROS PCT: 67 %
Neutro Abs: 3.4 10*3/uL (ref 1.4–6.5)
Platelets: 221 10*3/uL (ref 150–440)
RBC: 3.94 MIL/uL — AB (ref 4.40–5.90)
RDW: 16.4 % — ABNORMAL HIGH (ref 11.5–14.5)
WBC: 5 10*3/uL (ref 3.8–10.6)

## 2015-11-17 MED ORDER — SODIUM CHLORIDE 0.9 % IV SOLN
Freq: Once | INTRAVENOUS | Status: AC
  Administered 2015-11-17: 12:00:00 via INTRAVENOUS
  Filled 2015-11-17: qty 5

## 2015-11-17 MED ORDER — HEPARIN SOD (PORK) LOCK FLUSH 100 UNIT/ML IV SOLN
500.0000 [IU] | Freq: Once | INTRAVENOUS | Status: AC | PRN
  Administered 2015-11-17: 500 [IU]
  Filled 2015-11-17: qty 5

## 2015-11-17 MED ORDER — SODIUM CHLORIDE 0.9 % IV SOLN
80.0000 mg/m2 | Freq: Once | INTRAVENOUS | Status: AC
  Administered 2015-11-17: 140 mg via INTRAVENOUS
  Filled 2015-11-17: qty 7

## 2015-11-17 MED ORDER — PALONOSETRON HCL INJECTION 0.25 MG/5ML
0.2500 mg | Freq: Once | INTRAVENOUS | Status: AC
Start: 2015-11-17 — End: 2015-11-17
  Administered 2015-11-17: 0.25 mg via INTRAVENOUS
  Filled 2015-11-17: qty 5

## 2015-11-17 MED ORDER — SODIUM CHLORIDE 0.9 % IV SOLN
80.0000 mg/m2 | Freq: Once | INTRAVENOUS | Status: AC
  Administered 2015-11-17: 139 mg via INTRAVENOUS
  Filled 2015-11-17: qty 139

## 2015-11-17 MED ORDER — SODIUM CHLORIDE 0.9 % IV SOLN
Freq: Once | INTRAVENOUS | Status: AC
  Administered 2015-11-17: 10:00:00 via INTRAVENOUS
  Filled 2015-11-17: qty 1000

## 2015-11-17 MED ORDER — POTASSIUM CHLORIDE 2 MEQ/ML IV SOLN
Freq: Once | INTRAVENOUS | Status: AC
  Administered 2015-11-17: 10:00:00 via INTRAVENOUS
  Filled 2015-11-17: qty 1000

## 2015-11-17 MED ORDER — SODIUM CHLORIDE 0.9% FLUSH
10.0000 mL | INTRAVENOUS | Status: DC | PRN
  Administered 2015-11-17: 10 mL
  Filled 2015-11-17: qty 10

## 2015-11-17 NOTE — Progress Notes (Signed)
[place]  [phone]) [phone] Fax:(336) [phone]  ID: David [name] OB: 1956/01/14  MR#: [id number]  CWU#:[id number]  Patient Care Team: No Pcp Per Patient as PCP - General (General Practice)  CHIEF COMPLAINT:  Chief Complaint  Patient presents with  . Lung Cancer    INTERVAL HISTORY: Patient returns to clinic today for further evaluation and reconsideration of cycle 3 of 4 of cisplatin and etoposide. He does not complain of pain today. He currently feels well. He admits to a decreased appetite. He also has cough with occasional short of breath. He has no neurologic complaints. He denies any fevers. He denies any chest pain or hemoptysis. He denies any nausea, vomiting, constipation, or diarrhea. He has no urinary complaints. Patient offers no further specific complaints.  REVIEW OF SYSTEMS:   Review of Systems  Constitutional: Negative for fever, weight loss and malaise/fatigue.  Respiratory: Positive for cough and shortness of breath. Negative for hemoptysis.   Cardiovascular: Negative.  Negative for chest pain.  Gastrointestinal: Negative.   Musculoskeletal: Positive for joint pain.  Neurological: Negative.  Negative for weakness and headaches.    As per HPI. Otherwise, a complete review of systems is negatve.  PAST MEDICAL HISTORY: Past Medical History  Diagnosis Date  . History of melanoma   . History of squamous cell carcinoma   . COPD (chronic obstructive pulmonary disease) ([place])   . Cancer [facility])     PAST SURGICAL HISTORY: Past Surgical History  Procedure Laterality Date  . Skin graft      [facility]  . Peripheral vascular catheterization N/A 09/15/2015    Procedure: Glori Luis Cath Insertion;  Surgeon: Algernon Huxley, MD;  Location: [place] [facility];  Service: Cardiovascular;  Laterality: N/A;    FAMILY HISTORY: Reviewed and unchanged. No reported history of malignancy or chronic disease.     ADVANCED DIRECTIVES:    HEALTH  MAINTENANCE: Social History  Substance Use Topics  . Smoking status: Current Every Day Smoker -- 1.50 packs/day for 51 years    Types: Cigarettes    Start date: 08/12/1994  . Smokeless tobacco: Never Used  . Alcohol Use: No     Colonoscopy:  PAP:  Bone density:  Lipid panel:  No Known Allergies  Current Outpatient Prescriptions  Medication Sig Dispense Refill  . colchicine 0.6 MG tablet Take 2 tablets (1.2 mg) and then one hour later take on tablet (0.6 mg) 3 tablet 0  . indomethacin (INDOCIN) 25 MG capsule Take 1 capsule by mouth 3 (three) times daily as needed.  0  . lidocaine-prilocaine (EMLA) cream Apply to affected area once 30 g 3  . ondansetron (ZOFRAN) 8 MG tablet Take 1 tablet (8 mg total) by mouth 2 (two) times daily as needed. Start on the third day after cisplatin chemotherapy. 30 tablet 1  . oxyCODONE-acetaminophen (PERCOCET/ROXICET) 5-325 MG tablet Take 1 tablet by mouth every 4 (four) hours as needed for severe pain. 40 tablet 0  . prochlorperazine (COMPAZINE) 10 MG tablet Take 1 tablet (10 mg total) by mouth every 6 (six) hours as needed (Nausea or vomiting). 30 tablet 1   No current facility-administered medications for this visit.   Facility-Administered Medications Ordered in Other Visits  Medication Dose Route Frequency Provider Last Rate Last Dose  . 0.9 %  sodium chloride infusion   Intravenous Once Lloyd Huger, MD      . CISplatin (PLATINOL) 139 mg in sodium chloride 0.9 % 500 mL chemo infusion  80 mg/m2 (  Treatment Plan Actual) Intravenous Once Lloyd Huger, MD      . dextrose 5 % and 0.45% NaCl 1,000 mL with potassium chloride 20 mEq, magnesium sulfate 12 mEq, mannitol 12.5 g infusion   Intravenous Once Lloyd Huger, MD      . etoposide (VEPESID) 140 mg in sodium chloride 0.9 % 500 mL chemo infusion  80 mg/m2 (Treatment Plan Actual) Intravenous Once Lloyd Huger, MD      . fosaprepitant (EMEND) 150 mg, dexamethasone (DECADRON) 12 mg  in sodium chloride 0.9 % 145 mL IVPB   Intravenous Once Lloyd Huger, MD      . heparin lock flush 100 unit/mL  500 Units Intracatheter Once PRN Lloyd Huger, MD      . palonosetron (ALOXI) injection 0.25 mg  0.25 mg Intravenous Once Lloyd Huger, MD      . sodium chloride flush (NS) 0.9 % injection 10 mL  10 mL Intracatheter PRN Lloyd Huger, MD   10 mL at 11/17/15 0900    OBJECTIVE: Filed Vitals:   11/17/15 0907  BP: 110/71  Pulse: 94  Temp: 96.2 F (35.7 C)  Resp: 16     Body mass index is 19.79 kg/(m^2).    ECOG FS:0 - Asymptomatic  General: Thin, no acute distress. Eyes: Pink conjunctiva, anicteric sclera. Lungs: Clear to auscultation bilaterally. Heart: Regular rate and rhythm. No rubs, murmurs, or gallops. Abdomen: Soft, nontender, nondistended. No organomegaly noted, normoactive bowel sounds. Musculoskeletal: No edema, cyanosis, or clubbing. Neuro: Alert, answering all questions appropriately. Cranial nerves grossly intact. Skin: No rashes or petechiae noted. Psych: Normal affect.  LAB RESULTS:  Lab Results  Component Value Date   NA 136 11/17/2015   K 3.4* 11/17/2015   CL 104 11/17/2015   CO2 27 11/17/2015   GLUCOSE 145* 11/17/2015   BUN 16 11/17/2015   CREATININE 1.14 11/17/2015   CALCIUM 9.0 11/17/2015   PROT 7.3 11/17/2015   ALBUMIN 3.9 11/17/2015   AST 17 11/17/2015   ALT 10* 11/17/2015   ALKPHOS 63 11/17/2015   BILITOT 0.5 11/17/2015   GFRNONAA >60 11/17/2015   GFRAA >60 11/17/2015    Lab Results  Component Value Date   WBC 5.0 11/17/2015   NEUTROABS 3.4 11/17/2015   HGB 12.4* 11/17/2015   HCT 34.9* 11/17/2015   MCV 88.7 11/17/2015   PLT 221 11/17/2015     STUDIES: No results found.  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  PLAN:    1. Small cell lung cancer: PET scan and pathology results reviewed independently. MRI the brain negative for metastatic disease. Proceed with cycle 3 of cisplatin  and etoposide today. Now the patient has concluded his XRT, can add OnPro Neulasta on day 3 of etoposide with the remainder of his treatments. Return to clinic in 1 and 2 days for etoposide only and then in 3 weeks for consideration of cycle 4. Plan to reimage at the conclusion of cycle 4.  At the conclusion of his treatment, he will also require PCI.  2. Weight loss: Resolved. Likely multifactorial, unclear how much. Monitor. 3. History of melanoma: Patient is a poor historian but appears to have been greater than 3 years ago. Monitor. 4. Pain: Patient was given a prescription for Percocet last week. Continue to monitor closely, there is suspicion of drug-seeking behavior.  5. Neutropenia: Resolved. OnPro Neulasta as above.  Patient expressed understanding and was in agreement with this plan. He also  understands that He can call clinic at any time with any questions, concerns, or complaints.   Lloyd Huger, MD   11/17/2015 9:56 AM

## 2015-11-18 ENCOUNTER — Inpatient Hospital Stay: Payer: Medicaid Other

## 2015-11-18 VITALS — BP 114/61 | HR 80 | Temp 97.0°F | Resp 20

## 2015-11-18 DIAGNOSIS — C3491 Malignant neoplasm of unspecified part of right bronchus or lung: Secondary | ICD-10-CM

## 2015-11-18 DIAGNOSIS — Z5111 Encounter for antineoplastic chemotherapy: Secondary | ICD-10-CM | POA: Diagnosis not present

## 2015-11-18 MED ORDER — SODIUM CHLORIDE 0.9 % IV SOLN
80.0000 mg/m2 | Freq: Once | INTRAVENOUS | Status: AC
  Administered 2015-11-18: 140 mg via INTRAVENOUS
  Filled 2015-11-18: qty 7

## 2015-11-18 MED ORDER — SODIUM CHLORIDE 0.9 % IV SOLN
Freq: Once | INTRAVENOUS | Status: AC
  Administered 2015-11-18: 14:00:00 via INTRAVENOUS
  Filled 2015-11-18: qty 1000

## 2015-11-18 MED ORDER — HEPARIN SOD (PORK) LOCK FLUSH 100 UNIT/ML IV SOLN
500.0000 [IU] | Freq: Once | INTRAVENOUS | Status: AC | PRN
  Administered 2015-11-18: 500 [IU]
  Filled 2015-11-18: qty 5

## 2015-11-18 MED ORDER — SODIUM CHLORIDE 0.9% FLUSH
10.0000 mL | INTRAVENOUS | Status: DC | PRN
  Administered 2015-11-18: 10 mL
  Filled 2015-11-18: qty 10

## 2015-11-18 MED ORDER — SODIUM CHLORIDE 0.9 % IV SOLN
10.0000 mg | Freq: Once | INTRAVENOUS | Status: AC
  Administered 2015-11-18: 10 mg via INTRAVENOUS
  Filled 2015-11-18: qty 1

## 2015-11-19 ENCOUNTER — Inpatient Hospital Stay: Payer: Medicaid Other

## 2015-11-19 VITALS — BP 116/68 | HR 78 | Temp 96.7°F | Resp 20

## 2015-11-19 DIAGNOSIS — C3491 Malignant neoplasm of unspecified part of right bronchus or lung: Secondary | ICD-10-CM

## 2015-11-19 DIAGNOSIS — Z5111 Encounter for antineoplastic chemotherapy: Secondary | ICD-10-CM | POA: Diagnosis not present

## 2015-11-19 MED ORDER — SODIUM CHLORIDE 0.9 % IV SOLN
Freq: Once | INTRAVENOUS | Status: AC
  Administered 2015-11-19: 15:00:00 via INTRAVENOUS
  Filled 2015-11-19: qty 1000

## 2015-11-19 MED ORDER — SODIUM CHLORIDE 0.9 % IV SOLN
80.0000 mg/m2 | Freq: Once | INTRAVENOUS | Status: AC
  Administered 2015-11-19: 140 mg via INTRAVENOUS
  Filled 2015-11-19: qty 7

## 2015-11-19 MED ORDER — PEGFILGRASTIM 6 MG/0.6ML ~~LOC~~ PSKT
6.0000 mg | PREFILLED_SYRINGE | Freq: Once | SUBCUTANEOUS | Status: AC
  Administered 2015-11-19: 6 mg via SUBCUTANEOUS
  Filled 2015-11-19: qty 0.6

## 2015-11-19 MED ORDER — HEPARIN SOD (PORK) LOCK FLUSH 100 UNIT/ML IV SOLN
500.0000 [IU] | Freq: Once | INTRAVENOUS | Status: AC | PRN
  Administered 2015-11-19: 500 [IU]
  Filled 2015-11-19: qty 5

## 2015-11-19 MED ORDER — SODIUM CHLORIDE 0.9 % IV SOLN
10.0000 mg | Freq: Once | INTRAVENOUS | Status: AC
  Administered 2015-11-19: 10 mg via INTRAVENOUS
  Filled 2015-11-19: qty 1

## 2015-11-19 NOTE — Progress Notes (Unsigned)
Patient and girlfriend are currently living with a friend, temporarily, as a result of being evicted from their home.  Patient's daughter would like to assist patient in her home, but her electricity has been disconnected.  Patient receives a small SSI check each month.  Patient will be assisted with resources through the [place] and [place].  PSN explained to family why patient receives [employer] income, and not Social Security Disability.

## 2015-11-21 ENCOUNTER — Other Ambulatory Visit: Payer: Self-pay | Admitting: *Deleted

## 2015-11-21 NOTE — Telephone Encounter (Addendum)
Called to report that he is coughing and having pain in posterior RML of chest "where the cancer is" Reports that cough and pain awoke him at 3 this morning. Also admits that he is using 2 tabs at a time instead of the 1 which is ordered because the pain is so severe. Has his last chemo treatment on 4/12 and is done with treatments now. Asking for a refill on his oxycodone/ APAP 5/325 mg. He last got # 40 tabs on 4/3 She stated that if do do not give him med she will take him somewhere else to get meds.

## 2015-11-21 NOTE — Telephone Encounter (Signed)
Per Dr B, he will not refill his pain meds. [name] informed and stated "that's fine, that's up to y'all"

## 2015-11-23 ENCOUNTER — Emergency Department
Admission: EM | Admit: 2015-11-23 | Discharge: 2015-11-23 | Disposition: A | Discharge status: Home / Self Care | Payer: Medicaid Other | Attending: Emergency Medicine | Admitting: Emergency Medicine

## 2015-11-23 ENCOUNTER — Encounter: Payer: Self-pay | Admitting: Emergency Medicine

## 2015-11-23 ENCOUNTER — Telehealth: Payer: Self-pay | Admitting: Hematology and Oncology

## 2015-11-23 DIAGNOSIS — C801 Malignant (primary) neoplasm, unspecified: Secondary | ICD-10-CM | POA: Insufficient documentation

## 2015-11-23 DIAGNOSIS — Z791 Long term (current) use of non-steroidal anti-inflammatories (NSAID): Secondary | ICD-10-CM | POA: Diagnosis not present

## 2015-11-23 DIAGNOSIS — J449 Chronic obstructive pulmonary disease, unspecified: Secondary | ICD-10-CM | POA: Insufficient documentation

## 2015-11-23 DIAGNOSIS — F17210 Nicotine dependence, cigarettes, uncomplicated: Secondary | ICD-10-CM | POA: Diagnosis not present

## 2015-11-23 DIAGNOSIS — R079 Chest pain, unspecified: Secondary | ICD-10-CM | POA: Diagnosis not present

## 2015-11-23 DIAGNOSIS — Z760 Encounter for issue of repeat prescription: Secondary | ICD-10-CM | POA: Diagnosis present

## 2015-11-23 DIAGNOSIS — G893 Neoplasm related pain (acute) (chronic): Secondary | ICD-10-CM | POA: Insufficient documentation

## 2015-11-23 MED ORDER — OXYCODONE-ACETAMINOPHEN 5-325 MG PO TABS
2.0000 | ORAL_TABLET | Freq: Once | ORAL | Status: AC
Start: 2015-11-23 — End: 2015-11-23
  Administered 2015-11-23: 2 via ORAL

## 2015-11-23 MED ORDER — OXYCODONE-ACETAMINOPHEN 5-325 MG PO TABS: 1.0000 | ORAL_TABLET | Freq: Four times a day (QID) | ORAL | Status: DC | PRN

## 2015-11-23 MED ORDER — OXYCODONE-ACETAMINOPHEN 5-325 MG PO TABS
ORAL_TABLET | ORAL | Status: AC
  Administered 2015-11-23: 2 via ORAL
  Filled 2015-11-23: qty 2

## 2015-11-23 NOTE — ED Provider Notes (Signed)
[facility] Emergency Department Provider Note  Time seen: 8:27 PM  I have reviewed the triage vital signs and the nursing notes.   HISTORY  Chief Complaint Medication Refill    HPI David [name] is a 60 y.o. 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 According to the patient is pain had increased this past chemotherapy round, he admits to taking Percocet 1-2 tablets every 6 hours instead of 1 tablet every 6 hours which she is prescribed. Patient called the oncology office but Dr. Woodfin Ganja again was out of the office this weekend and did not return until Monday, so they came to the emergency department hoping for relief. Describes the pain in the right side of his chest worse with cough. States he always coughs after chemotherapy which she received this past week. Describes his pain as moderate.     Past Medical History  Diagnosis Date  . History of melanoma   . History of squamous cell carcinoma   . COPD (chronic obstructive pulmonary disease) ([place])   . Cancer [facility])     Patient Active Problem List   Diagnosis Date Noted  . Small cell lung cancer ([place]) 09/08/2015    Past Surgical History  Procedure Laterality Date  . Skin graft      [facility]  . Peripheral vascular catheterization N/A 09/15/2015    Procedure: Glori Luis Cath Insertion;  Surgeon: Algernon Huxley, MD;  Location: [place] [facility];  Service: Cardiovascular;  Laterality: N/A;    Current Outpatient Rx  Name  Route  Sig  Dispense  Refill  . colchicine 0.6 MG tablet      Take 2 tablets (1.2 mg) and then one hour later take on tablet (0.6 mg)   3 tablet   0   . indomethacin (INDOCIN) 25 MG capsule   Oral   Take 1 capsule by mouth 3 (three) times daily as needed.      0   . lidocaine-prilocaine (EMLA) cream      Apply to affected area once   30 g   3   . ondansetron (ZOFRAN) 8 MG tablet   Oral   Take 1 tablet (8 mg  total) by mouth 2 (two) times daily as needed. Start on the third day after cisplatin chemotherapy.   30 tablet   1   . oxyCODONE-acetaminophen (PERCOCET/ROXICET) 5-325 MG tablet   Oral   Take 1 tablet by mouth every 4 (four) hours as needed for severe pain.   40 tablet   0   . prochlorperazine (COMPAZINE) 10 MG tablet   Oral   Take 1 tablet (10 mg total) by mouth every 6 (six) hours as needed (Nausea or vomiting).   30 tablet   1     Allergies Review of patient's allergies indicates no known allergies.  History reviewed. No pertinent family history.  Social History Social History  Substance Use Topics  . Smoking status: Current Every Day Smoker -- 1.50 packs/day for 51 years    Types: Cigarettes    Start date: 08/12/1994  . Smokeless tobacco: Never Used  . Alcohol Use: No    Review of Systems Constitutional: Negative for fever Cardiovascular: Right chest pain Respiratory: Negative for shortness of breath. Gastrointestinal: Negative for abdominal pain Musculoskeletal: Negative for back pain. Neurological: Negative for headache 10-point ROS otherwise negative.  ____________________________________________   PHYSICAL EXAM:  VITAL SIGNS: ED Triage Vitals  Enc Vitals Group     BP 11/23/15 1952 120/68 mmHg     Pulse Rate 11/23/15 1952 95     Resp 11/23/15 1952 18     Temp 11/23/15 1952 98.2 F (36.8 C)     Temp src --      SpO2 11/23/15 1952 96 %     Weight 11/23/15 1954 135 lb (61.236 kg)     Height 11/23/15 1954 '5\' 9"'$  (1.753 m)     Head Cir --      Peak Flow --      Pain Score 11/23/15 1954 8     Pain Loc --      Pain Edu? --      Excl. in [place]? --     Constitutional: Alert and oriented. Well appearing and in no distress. Eyes: Normal exam ENT   Head: Normocephalic and atraumatic   Mouth/Throat: Mucous membranes are moist. Cardiovascular: Normal rate, regular rhythm. No murmur Respiratory: Normal respiratory effort without tachypnea nor  retractions. Breath sounds are clear  Gastrointestinal: Soft and nontender. No distention.  Musculoskeletal: Nontender with normal range of motion in all extremities.  Neurologic:  Normal speech and language. No gross focal neurologic deficits Skin:  Skin is warm, dry and intact.  Psychiatric: Mood and affect are normal. Speech and behavior are normal.   ____________________________________________   INITIAL IMPRESSION / ASSESSMENT AND PLAN / ED COURSE  Pertinent labs & imaging results that were available during my care of the patient were reviewed by me and considered in my medical decision making (see chart for details).  Patient currently out of pain medication, on chemotherapy for small cell lung cancer. I discussed with the patient and need to take his medication as prescribed or discussed with his oncologist if his pain requirement has increased. Patient is to see his oncologist this week, I will provide the patient a very short course of pain medication until he can be seen by his primary oncologist. Patient denies any new complaints.  ____________________________________________   FINAL CLINICAL IMPRESSION(S) / ED DIAGNOSES  Cancer related pain   Harvest Dark, MD 11/23/15 2030

## 2015-11-23 NOTE — Telephone Encounter (Signed)
Re:  Tinnitus and nausea  Patient received cisplatin and etoposide this past Monday-Wednesday.  He has had a chronic cough.  He has developed tinnitus.  He had to leave church early today because of the ringing in his ears.  His cough makes his pain worse.  He has been taking 2 oycodone/Tylenol instead of 1 every 6 hours.  He ran out of his pain pills.  He started taking his nausea medicine instead to see if it would help.  His pharmacy (as well as many others) are closed today- Easter Sunday.  He was directed to the ER for pain medications/prescription.  Lequita Asal, MD

## 2015-11-23 NOTE — ED Notes (Signed)
Pt ran out of pain medication - cancer patient

## 2015-11-23 NOTE — Discharge Instructions (Signed)
Pain  WHAT IS PAIN WITHOUT A KNOWN CAUSE? Pain can occur in any part of the body and can range from mild to severe. Sometimes no cause can be found for why you are having pain. Some types of pain that can occur without a known cause include:   Headache.  Back pain.  Abdominal pain.  Neck pain. HOW IS PAIN WITHOUT A KNOWN CAUSE DIAGNOSED?  Your health care provider will try to find the cause of your pain. This may include:  Physical exam.  Medical history.  Blood tests.  Urine tests.  X-rays. If no cause is found, your health care provider may diagnose you with pain without a known cause.  IS THERE TREATMENT FOR PAIN WITHOUT A CAUSE?  Treatment depends on the kind of pain you have. Your health care provider may prescribe medicines to help relieve your pain.  WHAT CAN I DO AT HOME FOR MY PAIN?   Take medicines only as directed by your health care provider.  Stop any activities that cause pain. During periods of severe pain, bed rest may help.  Try to reduce your stress with activities such as yoga or meditation. Talk to your health care provider for other stress-reducing activity recommendations.  Exercise regularly, if approved by your health care provider.  Eat a healthy diet that includes fruits and vegetables. This may improve pain. Talk to your health care provider if you have any questions about your diet. WHAT IF MY PAIN DOES NOT GET BETTER?  If you have a painful condition and no reason can be found for the pain or the pain gets worse, it is important to follow up with your health care provider. It may be necessary to repeat tests and look further for a possible cause.    This information is not intended to replace advice given to you by your health care provider. Make sure you discuss any questions you have with your health care provider.   Document Released: 04/20/2001 Document Revised: 08/16/2014 Document Reviewed: 12/11/2013 Elsevier Interactive Patient Education  [employer].

## 2015-11-25 ENCOUNTER — Telehealth: Payer: Self-pay | Admitting: *Deleted

## 2015-11-25 NOTE — Telephone Encounter (Signed)
Called to say he went to ER on Sunday and was given more Percocet and was educated about her giving him more med than is prescribed as well as him getting too much acetaminophen. She said she takes full responsibilty for him getting double his pain med and also stated he will not have enough med to last until 5/1 appt. I explained to her that I had discussed this with Dr Grayland Ormond who said he had discussed this with Mr Whitefield at his last appt that he was not giving more than the 40 tabs which had to last him 3 weeks and that if he is now agreeable to patch, he can come in to discuss and he will give him a fentanyl patch. He declined this and she stated she will just give him Ibuprofen for the ringing in his ears which she had been giving him percocet for. She reports that he is also complaining of tingling in his hands and wants Dr Grayland Ormond to know that as well. She states she does not come to his appts with him because she cannot stand the smell of the burning flesh after radiation therapy

## 2015-12-01 ENCOUNTER — Emergency Department
Admission: EM | Admit: 2015-12-01 | Discharge: 2015-12-02 | Disposition: A | Discharge status: Home / Self Care | Payer: Medicaid Other | Attending: Emergency Medicine | Admitting: Emergency Medicine

## 2015-12-01 ENCOUNTER — Encounter: Payer: Self-pay | Admitting: Emergency Medicine

## 2015-12-01 DIAGNOSIS — F17210 Nicotine dependence, cigarettes, uncomplicated: Secondary | ICD-10-CM | POA: Insufficient documentation

## 2015-12-01 DIAGNOSIS — Z85828 Personal history of other malignant neoplasm of skin: Secondary | ICD-10-CM | POA: Insufficient documentation

## 2015-12-01 DIAGNOSIS — Z79899 Other long term (current) drug therapy: Secondary | ICD-10-CM | POA: Insufficient documentation

## 2015-12-01 DIAGNOSIS — K649 Unspecified hemorrhoids: Secondary | ICD-10-CM | POA: Diagnosis not present

## 2015-12-01 DIAGNOSIS — J449 Chronic obstructive pulmonary disease, unspecified: Secondary | ICD-10-CM | POA: Insufficient documentation

## 2015-12-01 DIAGNOSIS — Z85118 Personal history of other malignant neoplasm of bronchus and lung: Secondary | ICD-10-CM | POA: Insufficient documentation

## 2015-12-01 DIAGNOSIS — K5900 Constipation, unspecified: Secondary | ICD-10-CM | POA: Insufficient documentation

## 2015-12-01 DIAGNOSIS — K922 Gastrointestinal hemorrhage, unspecified: Secondary | ICD-10-CM | POA: Diagnosis present

## 2015-12-01 LAB — CBC WITH DIFFERENTIAL/PLATELET
BASOS ABS: 0 10*3/uL (ref 0–0.1)
BASOS PCT: 0 %
EOS PCT: 1 %
Eosinophils Absolute: 0.1 10*3/uL (ref 0–0.7)
HCT: 31.9 % — ABNORMAL LOW (ref 40.0–52.0)
Hemoglobin: 11.1 g/dL — ABNORMAL LOW (ref 13.0–18.0)
LYMPHS PCT: 15 %
Lymphs Abs: 0.9 10*3/uL — ABNORMAL LOW (ref 1.0–3.6)
MCH: 31.3 pg (ref 26.0–34.0)
MCHC: 34.7 g/dL (ref 32.0–36.0)
MCV: 90.2 fL (ref 80.0–100.0)
Monocytes Absolute: 0.7 10*3/uL (ref 0.2–1.0)
Monocytes Relative: 11 %
Neutro Abs: 4.4 10*3/uL (ref 1.4–6.5)
Neutrophils Relative %: 73 %
PLATELETS: 162 10*3/uL (ref 150–440)
RBC: 3.54 MIL/uL — AB (ref 4.40–5.90)
RDW: 16.9 % — ABNORMAL HIGH (ref 11.5–14.5)
WBC: 6.1 10*3/uL (ref 3.8–10.6)

## 2015-12-01 LAB — COMPREHENSIVE METABOLIC PANEL
ALBUMIN: 3.8 g/dL (ref 3.5–5.0)
ALT: 13 U/L — AB (ref 17–63)
AST: 14 U/L — AB (ref 15–41)
Alkaline Phosphatase: 71 U/L (ref 38–126)
Anion gap: 5 (ref 5–15)
BUN: 17 mg/dL (ref 6–20)
CHLORIDE: 108 mmol/L (ref 101–111)
CO2: 28 mmol/L (ref 22–32)
CREATININE: 1.46 mg/dL — AB (ref 0.61–1.24)
Calcium: 9.1 mg/dL (ref 8.9–10.3)
GFR calc Af Amer: 59 mL/min — ABNORMAL LOW (ref >60)
GFR, EST NON AFRICAN AMERICAN: 51 mL/min — AB (ref >60)
GLUCOSE: 90 mg/dL (ref 65–99)
Potassium: 4.3 mmol/L (ref 3.5–5.1)
SODIUM: 141 mmol/L (ref 135–145)
Total Bilirubin: 0.4 mg/dL (ref 0.3–1.2)
Total Protein: 7.2 g/dL (ref 6.5–8.1)

## 2015-12-01 LAB — APTT: APTT: 42 s — AB (ref 24–36)

## 2015-12-01 LAB — ABO/RH: ABO/RH(D): A POS

## 2015-12-01 LAB — TYPE AND SCREEN
ABO/RH(D): A POS
Antibody Screen: NEGATIVE

## 2015-12-01 LAB — PROTIME-INR
INR: 0.93
PROTHROMBIN TIME: 12.7 s (ref 11.4–15.0)

## 2015-12-01 NOTE — ED Notes (Addendum)
Pt presents to ED with bright red blood in his stools, constipation, and severe headache and ringing in his left ear since yesterday. No hx of the same. 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

## 2015-12-02 ENCOUNTER — Emergency Department: Payer: Medicaid Other

## 2015-12-02 MED ORDER — SENNOSIDES-DOCUSATE SODIUM 8.6-50 MG PO TABS: 2.0000 | ORAL_TABLET | Freq: Every evening | ORAL | Status: DC | PRN

## 2015-12-02 MED ORDER — OXYCODONE-ACETAMINOPHEN 5-325 MG PO TABS
1.0000 | ORAL_TABLET | Freq: Once | ORAL | Status: AC
  Administered 2015-12-02: 1 via ORAL
  Filled 2015-12-02: qty 1

## 2015-12-02 MED ORDER — OXYCODONE-ACETAMINOPHEN 5-325 MG PO TABS
1.0000 | ORAL_TABLET | Freq: Four times a day (QID) | ORAL | Status: DC | PRN
Start: 2015-12-02 — End: 2015-12-08

## 2015-12-02 NOTE — ED Provider Notes (Signed)
[facility] Emergency Department Provider Note  ____________________________________________  Time seen: 11:30 PM  I have reviewed the triage vital signs and the nursing notes.   HISTORY  Chief Complaint GI Bleeding      HPI David [name] is a 60 y.o. male with history of small cell lung cancer COPD squamous cell cancer presents with constipation times "few days. Patient states he had a very hard large bowel movement yesterday which required straining following defecation*patient stated that he noted bright red blood in the toilet. Patient denies any abdominal pain no lightheadedness no nausea or vomiting. Patient states I think it's my hemorrhoid. In addition the patient states that he is currently out of his Percocet that he ran out 2 days ago.     Past Medical History  Diagnosis Date  . History of melanoma   . History of squamous cell carcinoma   . COPD (chronic obstructive pulmonary disease) ([place])   . Cancer [facility])     Patient Active Problem List   Diagnosis Date Noted  . Small cell lung cancer ([place]) 09/08/2015    Past Surgical History  Procedure Laterality Date  . Skin graft      [facility]  . Peripheral vascular catheterization N/A 09/15/2015    Procedure: Glori Luis Cath Insertion;  Surgeon: Algernon Huxley, MD;  Location: [place] [facility];  Service: Cardiovascular;  Laterality: N/A;    Current Outpatient Rx  Name  Route  Sig  Dispense  Refill  . acetaminophen (TYLENOL) 325 MG tablet   Oral   Take 325 mg by mouth every 6 (six) hours as needed.         . lidocaine-prilocaine (EMLA) cream      Apply to affected area once   30 g   3   . ondansetron (ZOFRAN) 8 MG tablet   Oral   Take 1 tablet (8 mg total) by mouth 2 (two) times daily as needed. Start on the third day after cisplatin chemotherapy.   30 tablet   1   . oxyCODONE-acetaminophen (ROXICET) 5-325 MG tablet   Oral   Take 1 tablet by mouth every 6 (six) hours as needed.   20  tablet   0   . prochlorperazine (COMPAZINE) 10 MG tablet   Oral   Take 1 tablet (10 mg total) by mouth every 6 (six) hours as needed (Nausea or vomiting).   30 tablet   1     Allergies Vicodin  No family history on file.  Social History Social History  Substance Use Topics  . Smoking status: Current Every Day Smoker -- 1.00 packs/day for 51 years    Types: Cigarettes    Start date: 08/12/1994  . Smokeless tobacco: Never Used  . Alcohol Use: No    Review of Systems  Constitutional: Negative for fever. Eyes: Negative for visual changes. ENT: Negative for sore throat. Cardiovascular: Negative for chest pain. Respiratory: Negative for shortness of breath. Gastrointestinal: Negative for abdominal pain, vomiting and diarrhea. Positive for bright red blood per rectum Genitourinary: Negative for dysuria. Musculoskeletal: Negative for back pain. Skin: Negative for rash. Neurological: Negative for headaches, focal weakness or numbness.   10-point ROS otherwise negative.  ____________________________________________   PHYSICAL EXAM:  VITAL SIGNS: ED Triage Vitals  Enc Vitals Group     BP 12/01/15 2011 119/77 mmHg     Pulse Rate 12/01/15 2011 90     Resp 12/01/15 2011 20     Temp 12/01/15 2011 98.3  F (36.8 C)     Temp Source 12/01/15 2011 Oral     SpO2 12/01/15 2011 97 %     Weight 12/01/15 2011 132 lb (59.875 kg)     Height 12/01/15 2011 '5\' 9"'$  (1.753 m)     Head Cir --      Peak Flow --      Pain Score 12/01/15 2012 9     Pain Loc --      Pain Edu? --      Excl. in [place]? --     Constitutional: Alert and oriented. Well appearing and in no distress. Eyes: Conjunctivae are normal. PERRL. Normal extraocular movements. ENT   Head: Normocephalic and atraumatic.   Nose: No congestion/rhinnorhea.   Mouth/Throat: Mucous membranes are moist.   Neck: No stridor. Hematological/Lymphatic/Immunilogical: No cervical lymphadenopathy. Cardiovascular: Normal  rate, regular rhythm. Normal and symmetric distal pulses are present in all extremities. No murmurs, rubs, or gallops. Respiratory: Normal respiratory effort without tachypnea nor retractions. Breath sounds are clear and equal bilaterally. No wheezes/rales/rhonchi. Gastrointestinal: Soft and nontender. No distention. There is no CVA tenderness.External hemorrhoid noted at 6:00 position no active bleeding Genitourinary: deferred Musculoskeletal: Nontender with normal range of motion in all extremities. No joint effusions.  No lower extremity tenderness nor edema. Neurologic:  Normal speech and language. No gross focal neurologic deficits are appreciated. Speech is normal.  Skin:  Skin is warm, dry and intact. No rash noted. Psychiatric: Mood and affect are normal. Speech and behavior are normal. Patient exhibits appropriate insight and judgment.  ____________________________________________    LABS (pertinent positives/negatives)  Labs Reviewed  CBC WITH DIFFERENTIAL/PLATELET - Abnormal; Notable for the following:    RBC 3.54 (*)    Hemoglobin 11.1 (*)    HCT 31.9 (*)    RDW 16.9 (*)    Lymphs Abs 0.9 (*)    All other components within normal limits  COMPREHENSIVE METABOLIC PANEL - Abnormal; Notable for the following:    Creatinine, Ser 1.46 (*)    AST 14 (*)    ALT 13 (*)    GFR calc non Af Amer 51 (*)    GFR calc Af Amer 59 (*)    All other components within normal limits  APTT - Abnormal; Notable for the following:    aPTT 42 (*)    All other components within normal limits  PROTIME-INR  TYPE AND SCREEN  ABO/RH         RADIOLOGY Imaging Results       DG Abd 1 View (Final result) Result time: 12/02/15 01:11:38   Final result by Rad Results In Interface (12/02/15 01:11:38)   Narrative:   CLINICAL DATA: Acute onset of bright red blood in stool. Initial encounter.  EXAM: ABDOMEN - 1 VIEW  COMPARISON: PET/CT performed 08/21/2015  FINDINGS: The visualized  bowel gas pattern is unremarkable. Scattered air and stool filled loops of colon are seen; no abnormal dilatation of small bowel loops is seen to suggest small bowel obstruction. No free intra-abdominal air is identified, though evaluation for free air is limited on a single supine view.  The visualized osseous structures are within normal limits; the sacroiliac joints are unremarkable in appearance. The visualized lung bases are essentially clear.  IMPRESSION: Unremarkable bowel gas pattern; no free intra-abdominal air seen. Small amount of stool noted in the colon.   Electronically Signed By: Garald Balding M.D. On: 12/02/2015 01:11          INITIAL IMPRESSION / ASSESSMENT AND  PLAN / ED COURSE  Pertinent labs & imaging results that were available during my care of the patient were reviewed by me and considered in my medical decision making (see chart for details).    ____________________________________________   FINAL CLINICAL IMPRESSION(S) / ED DIAGNOSES  Final diagnoses:  Constipation, unspecified constipation type  Hemorrhoids, unspecified hemorrhoid type      Gregor Hams, MD 12/02/15 [phone]

## 2015-12-02 NOTE — Discharge Instructions (Signed)
Constipation, Adult Constipation is when a person has fewer than three bowel movements a week, has difficulty having a bowel movement, or has stools that are dry, hard, or larger than normal. As people grow older, constipation is more common. A low-fiber diet, not taking in enough fluids, and taking certain medicines may make constipation worse.  CAUSES   Certain medicines, such as antidepressants, pain medicine, iron supplements, antacids, and water pills.   Certain diseases, such as diabetes, irritable bowel syndrome (IBS), thyroid disease, or depression.   Not drinking enough water.   Not eating enough fiber-rich foods.   Stress or travel.   Lack of physical activity or exercise.   Ignoring the urge to have a bowel movement.   Using laxatives too much.  SIGNS AND SYMPTOMS   Having fewer than three bowel movements a week.   Straining to have a bowel movement.   Having stools that are hard, dry, or larger than normal.   Feeling full or bloated.   Pain in the lower abdomen.   Not feeling relief after having a bowel movement.  DIAGNOSIS  Your health care provider will take a medical history and perform a physical exam. Further testing may be done for severe constipation. Some tests may include:  A barium enema X-ray to examine your rectum, colon, and, sometimes, your small intestine.   A sigmoidoscopy to examine your lower colon.   A colonoscopy to examine your entire colon. TREATMENT  Treatment will depend on the severity of your constipation and what is causing it. Some dietary treatments include drinking more fluids and eating more fiber-rich foods. Lifestyle treatments may include regular exercise. If these diet and lifestyle recommendations do not help, your health care provider may recommend taking over-the-counter laxative medicines to help you have bowel movements. Prescription medicines may be prescribed if over-the-counter medicines do not work.    HOME CARE INSTRUCTIONS   Eat foods that have a lot of fiber, such as fruits, vegetables, whole grains, and beans.  Limit foods high in fat and processed sugars, such as french fries, hamburgers, cookies, candies, and soda.   A fiber supplement may be added to your diet if you cannot get enough fiber from foods.   Drink enough fluids to keep your urine clear or pale yellow.   Exercise regularly or as directed by your health care provider.   Go to the restroom when you have the urge to go. Do not hold it.   Only take over-the-counter or prescription medicines as directed by your health care provider. Do not take other medicines for constipation without talking to your health care provider first.  [place] IF:   You have bright red blood in your stool.   Your constipation lasts for more than 4 days or gets worse.   You have abdominal or rectal pain.   You have thin, pencil-like stools.   You have unexplained weight loss. MAKE SURE YOU:   Understand these instructions.  Will watch your condition.  Will get help right away if you are not doing well or get worse.   This information is not intended to replace advice given to you by your health care provider. Make sure you discuss any questions you have with your health care provider.   Document Released: 04/23/2004 Document Revised: 08/16/2014 Document Reviewed: 05/07/2013 Elsevier Interactive Patient Education 2016 [employer].  Hemorrhoids Hemorrhoids are swollen veins around the rectum or anus. There are two types of hemorrhoids:   Internal  hemorrhoids. These occur in the veins just inside the rectum. They may poke through to the outside and become irritated and painful.  External hemorrhoids. These occur in the veins outside the anus and can be felt as a painful swelling or hard lump near the anus. CAUSES  Pregnancy.   Obesity.   Constipation or diarrhea.   Straining to have a  bowel movement.   Sitting for long periods on the toilet.  Heavy lifting or other activity that caused you to strain.  Anal intercourse. SYMPTOMS   Pain.   Anal itching or irritation.   Rectal bleeding.   Fecal leakage.   Anal swelling.   One or more lumps around the anus.  DIAGNOSIS  Your caregiver may be able to diagnose hemorrhoids by visual examination. Other examinations or tests that may be performed include:   Examination of the rectal area with a gloved hand (digital rectal exam).   Examination of anal canal using a small tube (scope).   A blood test if you have lost a significant amount of blood.  A test to look inside the colon (sigmoidoscopy or colonoscopy). TREATMENT Most hemorrhoids can be treated at home. However, if symptoms do not seem to be getting better or if you have a lot of rectal bleeding, your caregiver may perform a procedure to help make the hemorrhoids get smaller or remove them completely. Possible treatments include:   Placing a rubber band at the base of the hemorrhoid to cut off the circulation (rubber band ligation).   Injecting a chemical to shrink the hemorrhoid (sclerotherapy).   Using a tool to burn the hemorrhoid (infrared light therapy).   Surgically removing the hemorrhoid (hemorrhoidectomy).   Stapling the hemorrhoid to block blood flow to the tissue (hemorrhoid stapling).  HOME CARE INSTRUCTIONS   Eat foods with fiber, such as whole grains, beans, nuts, fruits, and vegetables. Ask your doctor about taking products with added fiber in them (fibersupplements).  Increase fluid intake. Drink enough water and fluids to keep your urine clear or pale yellow.   Exercise regularly.   Go to the bathroom when you have the urge to have a bowel movement. Do not wait.   Avoid straining to have bowel movements.   Keep the anal area dry and clean. Use wet toilet paper or moist towelettes after a bowel movement.    Medicated creams and suppositories may be used or applied as directed.   Only take over-the-counter or prescription medicines as directed by your caregiver.   Take warm sitz baths for 15-20 minutes, 3-4 times a day to ease pain and discomfort.   Place ice packs on the hemorrhoids if they are tender and swollen. Using ice packs between sitz baths may be helpful.   Put ice in a plastic bag.   Place a towel between your skin and the bag.   Leave the ice on for 15-20 minutes, 3-4 times a day.   Do not use a donut-shaped pillow or sit on the toilet for long periods. This increases blood pooling and pain.  SEEK MEDICAL CARE IF:  You have increasing pain and swelling that is not controlled by treatment or medicine.  You have uncontrolled bleeding.  You have difficulty or you are unable to have a bowel movement.  You have pain or inflammation outside the area of the hemorrhoids. MAKE SURE YOU:  Understand these instructions.  Will watch your condition.  Will get help right away if you are not doing well or  get worse.   This information is not intended to replace advice given to you by your health care provider. Make sure you discuss any questions you have with your health care provider.   Document Released: 07/23/2000 Document Revised: 07/12/2012 Document Reviewed: 05/30/2012 Elsevier Interactive Patient Education [employer].

## 2015-12-08 ENCOUNTER — Inpatient Hospital Stay: Payer: Medicaid Other | Attending: Oncology

## 2015-12-08 ENCOUNTER — Inpatient Hospital Stay (HOSPITAL_BASED_OUTPATIENT_CLINIC_OR_DEPARTMENT_OTHER): Payer: Medicaid Other | Admitting: Oncology

## 2015-12-08 ENCOUNTER — Inpatient Hospital Stay: Payer: Medicaid Other

## 2015-12-08 VITALS — BP 126/78 | HR 71 | Temp 96.3°F | Wt 135.4 lb

## 2015-12-08 DIAGNOSIS — Z79899 Other long term (current) drug therapy: Secondary | ICD-10-CM | POA: Insufficient documentation

## 2015-12-08 DIAGNOSIS — R05 Cough: Secondary | ICD-10-CM | POA: Diagnosis not present

## 2015-12-08 DIAGNOSIS — R51 Headache: Secondary | ICD-10-CM

## 2015-12-08 DIAGNOSIS — Z5111 Encounter for antineoplastic chemotherapy: Secondary | ICD-10-CM | POA: Diagnosis present

## 2015-12-08 DIAGNOSIS — F17210 Nicotine dependence, cigarettes, uncomplicated: Secondary | ICD-10-CM

## 2015-12-08 DIAGNOSIS — Z85828 Personal history of other malignant neoplasm of skin: Secondary | ICD-10-CM | POA: Diagnosis not present

## 2015-12-08 DIAGNOSIS — C3491 Malignant neoplasm of unspecified part of right bronchus or lung: Secondary | ICD-10-CM

## 2015-12-08 DIAGNOSIS — H9319 Tinnitus, unspecified ear: Secondary | ICD-10-CM | POA: Insufficient documentation

## 2015-12-08 LAB — CBC WITH DIFFERENTIAL/PLATELET
Basophils Absolute: 0 10*3/uL (ref 0–0.1)
Basophils Relative: 1 %
EOS PCT: 2 %
Eosinophils Absolute: 0.1 10*3/uL (ref 0–0.7)
HCT: 29.6 % — ABNORMAL LOW (ref 40.0–52.0)
HEMOGLOBIN: 10.6 g/dL — AB (ref 13.0–18.0)
LYMPHS ABS: 0.9 10*3/uL — AB (ref 1.0–3.6)
LYMPHS PCT: 15 %
MCH: 32.6 pg (ref 26.0–34.0)
MCHC: 35.9 g/dL (ref 32.0–36.0)
MCV: 90.9 fL (ref 80.0–100.0)
MONOS PCT: 11 %
Monocytes Absolute: 0.7 10*3/uL (ref 0.2–1.0)
NEUTROS PCT: 71 %
Neutro Abs: 4.6 10*3/uL (ref 1.4–6.5)
PLATELETS: 293 10*3/uL (ref 150–440)
RBC: 3.26 MIL/uL — ABNORMAL LOW (ref 4.40–5.90)
RDW: 18.1 % — AB (ref 11.5–14.5)
WBC: 6.4 10*3/uL (ref 3.8–10.6)

## 2015-12-08 LAB — COMPREHENSIVE METABOLIC PANEL
ALBUMIN: 3.6 g/dL (ref 3.5–5.0)
ALT: 10 U/L — ABNORMAL LOW (ref 17–63)
ANION GAP: 5 (ref 5–15)
AST: 19 U/L (ref 15–41)
Alkaline Phosphatase: 66 U/L (ref 38–126)
BILIRUBIN TOTAL: 0.3 mg/dL (ref 0.3–1.2)
BUN: 9 mg/dL (ref 6–20)
CHLORIDE: 106 mmol/L (ref 101–111)
CO2: 27 mmol/L (ref 22–32)
Calcium: 8.7 mg/dL — ABNORMAL LOW (ref 8.9–10.3)
Creatinine, Ser: 1.09 mg/dL (ref 0.61–1.24)
GFR calc Af Amer: >60 mL/min (ref >60)
GFR calc non Af Amer: >60 mL/min (ref >60)
GLUCOSE: 90 mg/dL (ref 65–99)
POTASSIUM: 3.8 mmol/L (ref 3.5–5.1)
SODIUM: 138 mmol/L (ref 135–145)
TOTAL PROTEIN: 6.7 g/dL (ref 6.5–8.1)

## 2015-12-08 MED ORDER — ALBUTEROL SULFATE HFA 108 (90 BASE) MCG/ACT IN AERS: 2.0000 | INHALATION_SPRAY | Freq: Four times a day (QID) | RESPIRATORY_TRACT | Status: DC | PRN

## 2015-12-08 MED ORDER — HEPARIN SOD (PORK) LOCK FLUSH 100 UNIT/ML IV SOLN
500.0000 [IU] | Freq: Once | INTRAVENOUS | Status: AC | PRN
  Administered 2015-12-08: 500 [IU]

## 2015-12-08 MED ORDER — OXYCODONE-ACETAMINOPHEN 5-325 MG PO TABS: 1.0000 | ORAL_TABLET | Freq: Four times a day (QID) | ORAL | Status: DC | PRN

## 2015-12-08 NOTE — Progress Notes (Signed)
Patient is having severe ringing in ears.  Also has constant headaches after treatment for 2 weeks.  Is requesting an increase in pain medication due to the ringing in the ears.  The cough is not improving and is worse at night.  Reports pain is 9/10 today on right side.

## 2015-12-09 ENCOUNTER — Inpatient Hospital Stay: Payer: Medicaid Other

## 2015-12-10 ENCOUNTER — Inpatient Hospital Stay: Payer: Medicaid Other

## 2015-12-11 ENCOUNTER — Encounter: Payer: Self-pay | Admitting: Radiation Oncology

## 2015-12-11 ENCOUNTER — Ambulatory Visit
Admission: RE | Admit: 2015-12-11 | Discharge: 2015-12-11 | Disposition: A | Discharge status: Home / Self Care | Payer: Medicaid Other | Source: Ambulatory Visit | Attending: Radiation Oncology | Admitting: Radiation Oncology

## 2015-12-11 VITALS — BP 129/72 | HR 81 | Temp 95.2°F | Resp 20 | Wt 133.2 lb

## 2015-12-11 DIAGNOSIS — C3491 Malignant neoplasm of unspecified part of right bronchus or lung: Secondary | ICD-10-CM

## 2015-12-11 NOTE — Progress Notes (Signed)
Radiation Oncology Follow up Note  Name: David [name]   Date:   12/11/2015 MRN:  [id number] DOB: May 22, 1956    This 60 y.o. 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  REFERRING PROVIDER: No ref. 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 Well. Has had a mild cough which is improved. Specifically denies dysphagia chest pain or hemoptysis.  COMPLICATIONS OF TREATMENT: none  FOLLOW UP COMPLIANCE: keeps appointments   PHYSICAL EXAM:  BP 129/72 mmHg  Pulse 81  Temp(Src) 95.2 F (35.1 C)  Resp 20  Wt 133 lb 2.5 oz (60.4 kg) Thin male in NAD. Well-developed well-nourished patient in NAD. HEENT reveals PERLA, EOMI, discs not visualized.  Oral cavity is clear. No oral mucosal lesions are identified. Neck is clear without evidence of cervical or supraclavicular adenopathy. Lungs are clear to A&P. Cardiac examination is essentially unremarkable with regular rate and rhythm without murmur rub or thrill. Abdomen is benign with no organomegaly or masses noted. Motor sensory and DTR levels are equal and symmetric in the upper and lower extremities. Cranial nerves II through XII are grossly intact. Proprioception is intact. No peripheral adenopathy or edema is identified. No motor or sensory levels are noted. Crude visual fields are within normal range.  RADIOLOGY RESULTS: Patient is scheduled for CT scan of the chest in the next month.   PLAN: At the present time he is doing well completing his chemotherapy. Will have a reevaluation for success of treatment after chemotherapy. Would then like to see the patient for possible whole brain radiation therapy for his small  cell lung cancer diagnosis. I've tentatively set up a follow-up in 3-4 months. Will see him sooner should he have is clear scan and will proceed with whole brain radiation.       Armstead Peaks., MD

## 2015-12-13 NOTE — Progress Notes (Signed)
[place]  Telephone:(336) [phone] Fax:(336) [phone]  ID: David [name] OB: [birth year]  MR#: [id number]  YTK#:[id number]  Patient Care Team: Lloyd Huger, MD as PCP - General (Oncology)  CHIEF COMPLAINT:  Chief Complaint  Patient presents with  . Lung Cancer    INTERVAL HISTORY: Patient returns to clinic today for further evaluation and consideration of cycle 4 of 4 of cisplatin and etoposide. Patient has multiple complaints today including tinnitus and headache. He continues to have a cough that is worse at night. He has decreased appetite. He continues to have pain, but is vague when describing the location. He has no other neurologic complaints. He denies any fevers. He denies any chest pain or hemoptysis. He denies any nausea, vomiting, constipation, or diarrhea. He has no urinary complaints. Patient offers no further specific complaints.  REVIEW OF SYSTEMS:   Review of Systems  Constitutional: Positive for malaise/fatigue. Negative for fever and weight loss.  HENT: Positive for tinnitus.   Respiratory: Positive for cough and shortness of breath. Negative for hemoptysis.   Cardiovascular: Negative.  Negative for chest pain.  Gastrointestinal: Negative.   Musculoskeletal: Positive for joint pain.  Neurological: Positive for weakness and headaches.  Psychiatric/Behavioral: Negative.     As per HPI. Otherwise, a complete review of systems is negatve.  PAST MEDICAL HISTORY: Past Medical History  Diagnosis Date  . History of melanoma   . History of squamous cell carcinoma   . COPD (chronic obstructive pulmonary disease) ([place])   . Cancer [facility])     PAST SURGICAL HISTORY: Past Surgical History  Procedure Laterality Date  . Skin graft      [facility]  . Peripheral vascular catheterization N/A 09/15/2015    Procedure: Glori Luis Cath Insertion;  Surgeon: Algernon Huxley, MD;  Location: [facility];  Service: Cardiovascular;  Laterality: N/A;    FAMILY  HISTORY: Reviewed and unchanged. No reported history of malignancy or chronic disease.     ADVANCED DIRECTIVES:    HEALTH MAINTENANCE: Social History  Substance Use Topics  . Smoking status: Current Every Day Smoker -- 1.00 packs/day for 51 years    Types: Cigarettes    Start date: 08/12/1994  . Smokeless tobacco: Never Used  . Alcohol Use: No     Colonoscopy:  PAP:  Bone density:  Lipid panel:  Allergies  Allergen Reactions  . Vicodin [Hydrocodone-Acetaminophen] Nausea Only    Current Outpatient Prescriptions  Medication Sig Dispense Refill  . acetaminophen (TYLENOL) 325 MG tablet Take 325 mg by mouth every 6 (six) hours as needed.    . lidocaine-prilocaine (EMLA) cream Apply to affected area once 30 g 3  . ondansetron (ZOFRAN) 8 MG tablet Take 1 tablet (8 mg total) by mouth 2 (two) times daily as needed. Start on the third day after cisplatin chemotherapy. 30 tablet 1  . oxyCODONE-acetaminophen (ROXICET) 5-325 MG tablet Take 1 tablet by mouth every 6 (six) hours as needed. 84 tablet 0  . prochlorperazine (COMPAZINE) 10 MG tablet Take 1 tablet (10 mg total) by mouth every 6 (six) hours as needed (Nausea or vomiting). 30 tablet 1  . senna-docusate (SENOKOT-S) 8.6-50 MG tablet Take 2 tablets by mouth at bedtime as needed for mild constipation. 20 tablet 0  . albuterol (PROVENTIL HFA;VENTOLIN HFA) 108 (90 Base) MCG/ACT inhaler Inhale 2 puffs into the lungs every 6 (six) hours as needed for wheezing or shortness of breath. 1 Inhaler 2   No current facility-administered medications for this visit.  Facility-Administered Medications Ordered in Other Visits  Medication Dose Route Frequency Provider Last Rate Last Dose  . 0.9 %  sodium chloride infusion   Intravenous Once Lloyd Huger, MD        OBJECTIVE: Filed Vitals:   12/08/15 0934  BP: 126/78  Pulse: 71  Temp: 96.3 F (35.7 C)     Body mass index is 19.98 kg/(m^2).    ECOG FS:0 - Asymptomatic  General: Thin,  no acute distress. Eyes: Pink conjunctiva, anicteric sclera. Lungs: Clear to auscultation bilaterally. Heart: Regular rate and rhythm. No rubs, murmurs, or gallops. Abdomen: Soft, nontender, nondistended. No organomegaly noted, normoactive bowel sounds. Musculoskeletal: No edema, cyanosis, or clubbing. Neuro: Alert, answering all questions appropriately. Cranial nerves grossly intact. Skin: No rashes or petechiae noted. Psych: Normal affect.  LAB RESULTS:  Lab Results  Component Value Date   NA 138 12/08/2015   K 3.8 12/08/2015   CL 106 12/08/2015   CO2 27 12/08/2015   GLUCOSE 90 12/08/2015   BUN 9 12/08/2015   CREATININE 1.09 12/08/2015   CALCIUM 8.7* 12/08/2015   PROT 6.7 12/08/2015   ALBUMIN 3.6 12/08/2015   AST 19 12/08/2015   ALT 10* 12/08/2015   ALKPHOS 66 12/08/2015   BILITOT 0.3 12/08/2015   GFRNONAA >60 12/08/2015   GFRAA >60 12/08/2015    Lab Results  Component Value Date   WBC 6.4 12/08/2015   NEUTROABS 4.6 12/08/2015   HGB 10.6* 12/08/2015   HCT 29.6* 12/08/2015   MCV 90.9 12/08/2015   PLT 293 12/08/2015     STUDIES: Dg Abd 1 View  12/02/2015  CLINICAL DATA:  Acute onset of bright red blood in stool. Initial encounter. EXAM: ABDOMEN - 1 VIEW COMPARISON:  PET/CT performed 08/21/2015 FINDINGS: The visualized bowel gas pattern is unremarkable. Scattered air and stool filled loops of colon are seen; no abnormal dilatation of small bowel loops is seen to suggest small bowel obstruction. No free intra-abdominal air is identified, though evaluation for free air is limited on a single supine view. The visualized osseous structures are within normal limits; the sacroiliac joints are unremarkable in appearance. The visualized lung bases are essentially clear. IMPRESSION: Unremarkable bowel gas pattern; no free intra-abdominal air seen. Small amount of stool noted in the colon. Electronically Signed   By: Garald Balding M.D.   On: 12/02/2015 01:11    ASSESSMENT: Stage  IIa small cell carcinoma of the right lung.  PLAN:    1. Small cell lung cancer: PET scan and pathology results reviewed independently. MRI the brain negative for metastatic disease. Patient has requested delaying cycle 3 stating if the beginning of the month and I need to pay bills". We will also delay 1 week and discontinue cisplatin secondary tinnitus. Return to clinic in 1 week for cycle 4 which will be carboplatinum and etoposide. Now the patient has concluded his XRT, can add OnPro Neulasta on day 3 of etoposide with the remainder of his treatments. Plan to reimage at the conclusion of cycle 4.  At the conclusion of his treatment, he will also require PCI.  2. Weight loss: Resolved. Likely multifactorial, unclear how much. Monitor. 3. History of melanoma: Patient is a poor historian but appears to have been greater than 3 years ago. Monitor. 4. Pain: Patient was given a prescription for Percocet today. Continue to monitor closely, there is suspicion of drug-seeking behavior.  5. Neutropenia: Resolved. OnPro Neulasta as above. 6. Cough: Recommended patient try OTC medications. Patient was also  given a prescription for albuterol and a pulmonary referral. 7. 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 Switch to carboplatinum as above.   Patient expressed understanding and was in agreement with this plan. He also understands that He can call clinic at any time with any questions, concerns, or complaints.   Lloyd Huger, MD   12/13/2015 8:27 AM

## 2015-12-15 ENCOUNTER — Telehealth: Payer: Self-pay | Admitting: *Deleted

## 2015-12-15 ENCOUNTER — Inpatient Hospital Stay: Payer: Medicaid Other

## 2015-12-15 ENCOUNTER — Other Ambulatory Visit: Payer: Self-pay | Admitting: *Deleted

## 2015-12-15 ENCOUNTER — Inpatient Hospital Stay: Payer: Medicaid Other | Admitting: Oncology

## 2015-12-15 DIAGNOSIS — C3491 Malignant neoplasm of unspecified part of right bronchus or lung: Secondary | ICD-10-CM

## 2015-12-15 MED ORDER — SODIUM CHLORIDE 0.9 % IV SOLN: Freq: Once | INTRAVENOUS | Status: DC

## 2015-12-15 NOTE — Telephone Encounter (Signed)
Did not bring to appt this morning because he has fever 100.9. Only complaints are diarrhea and a cold. Asking what he can take for his cold as "that DM med goes straight through him" I advised that she should have brought him for his appt this morning , we need to know what his labs are showing, asking if he can come in tomorrow. I told her I would speak with Dr Grayland Ormond and call her back. Please advise

## 2015-12-15 NOTE — Telephone Encounter (Signed)
Per Dr Grayland Ormond, come in for CBC, CMP and possible IVF tomorrow. David [name] called them with an appt

## 2015-12-16 ENCOUNTER — Inpatient Hospital Stay: Payer: Medicaid Other

## 2015-12-16 DIAGNOSIS — Z5111 Encounter for antineoplastic chemotherapy: Secondary | ICD-10-CM | POA: Diagnosis not present

## 2015-12-16 DIAGNOSIS — C3491 Malignant neoplasm of unspecified part of right bronchus or lung: Secondary | ICD-10-CM

## 2015-12-16 LAB — CBC WITH DIFFERENTIAL/PLATELET
BASOS ABS: 0.1 10*3/uL (ref 0–0.1)
Basophils Relative: 1 %
EOS PCT: 1 %
Eosinophils Absolute: 0.1 10*3/uL (ref 0–0.7)
HEMATOCRIT: 30.5 % — AB (ref 40.0–52.0)
Hemoglobin: 10.7 g/dL — ABNORMAL LOW (ref 13.0–18.0)
LYMPHS ABS: 1 10*3/uL (ref 1.0–3.6)
LYMPHS PCT: 11 %
MCH: 32.4 pg (ref 26.0–34.0)
MCHC: 35.2 g/dL (ref 32.0–36.0)
MCV: 92 fL (ref 80.0–100.0)
Monocytes Absolute: 1.1 10*3/uL — ABNORMAL HIGH (ref 0.2–1.0)
Monocytes Relative: 12 %
NEUTROS ABS: 6.7 10*3/uL — AB (ref 1.4–6.5)
Neutrophils Relative %: 75 %
PLATELETS: 243 10*3/uL (ref 150–440)
RBC: 3.31 MIL/uL — AB (ref 4.40–5.90)
RDW: 17.6 % — ABNORMAL HIGH (ref 11.5–14.5)
WBC: 8.9 10*3/uL (ref 3.8–10.6)

## 2015-12-16 LAB — COMPREHENSIVE METABOLIC PANEL
ALK PHOS: 64 U/L (ref 38–126)
ALT: 11 U/L — ABNORMAL LOW (ref 17–63)
AST: 18 U/L (ref 15–41)
Albumin: 3.8 g/dL (ref 3.5–5.0)
Anion gap: 7 (ref 5–15)
BILIRUBIN TOTAL: 0.2 mg/dL — AB (ref 0.3–1.2)
BUN: 17 mg/dL (ref 6–20)
CALCIUM: 8.8 mg/dL — AB (ref 8.9–10.3)
CHLORIDE: 106 mmol/L (ref 101–111)
CO2: 25 mmol/L (ref 22–32)
CREATININE: 1.05 mg/dL (ref 0.61–1.24)
Glucose, Bld: 107 mg/dL — ABNORMAL HIGH (ref 65–99)
Potassium: 3.8 mmol/L (ref 3.5–5.1)
Sodium: 138 mmol/L (ref 135–145)
TOTAL PROTEIN: 6.9 g/dL (ref 6.5–8.1)

## 2015-12-16 MED ORDER — SODIUM CHLORIDE 0.9% FLUSH
10.0000 mL | Freq: Once | INTRAVENOUS | Status: AC
  Administered 2015-12-16: 10 mL via INTRAVENOUS
  Filled 2015-12-16: qty 10

## 2015-12-16 MED ORDER — SODIUM CHLORIDE 0.9 % IV SOLN
Freq: Once | INTRAVENOUS | Status: AC
Start: 2015-12-16 — End: 2015-12-16
  Administered 2015-12-16: 10:00:00 via INTRAVENOUS
  Filled 2015-12-16: qty 1000

## 2015-12-16 MED ORDER — HEPARIN SOD (PORK) LOCK FLUSH 100 UNIT/ML IV SOLN
500.0000 [IU] | Freq: Once | INTRAVENOUS | Status: AC
  Administered 2015-12-16: 500 [IU] via INTRAVENOUS
  Filled 2015-12-16: qty 5

## 2015-12-17 ENCOUNTER — Inpatient Hospital Stay: Payer: Medicaid Other

## 2015-12-23 ENCOUNTER — Inpatient Hospital Stay: Payer: Medicaid Other

## 2015-12-23 ENCOUNTER — Ambulatory Visit: Payer: Medicaid Other

## 2015-12-23 ENCOUNTER — Inpatient Hospital Stay (HOSPITAL_BASED_OUTPATIENT_CLINIC_OR_DEPARTMENT_OTHER): Payer: Medicaid Other | Admitting: Oncology

## 2015-12-23 VITALS — BP 94/57 | HR 69 | Resp 20

## 2015-12-23 VITALS — BP 137/77 | HR 77 | Temp 96.3°F | Resp 18 | Wt 129.4 lb

## 2015-12-23 DIAGNOSIS — Z5111 Encounter for antineoplastic chemotherapy: Secondary | ICD-10-CM | POA: Diagnosis not present

## 2015-12-23 DIAGNOSIS — C3491 Malignant neoplasm of unspecified part of right bronchus or lung: Secondary | ICD-10-CM

## 2015-12-23 DIAGNOSIS — R51 Headache: Secondary | ICD-10-CM | POA: Diagnosis not present

## 2015-12-23 DIAGNOSIS — Z79899 Other long term (current) drug therapy: Secondary | ICD-10-CM

## 2015-12-23 DIAGNOSIS — R05 Cough: Secondary | ICD-10-CM

## 2015-12-23 DIAGNOSIS — F17210 Nicotine dependence, cigarettes, uncomplicated: Secondary | ICD-10-CM

## 2015-12-23 DIAGNOSIS — H9319 Tinnitus, unspecified ear: Secondary | ICD-10-CM

## 2015-12-23 DIAGNOSIS — Z85828 Personal history of other malignant neoplasm of skin: Secondary | ICD-10-CM

## 2015-12-23 LAB — CBC WITH DIFFERENTIAL/PLATELET
BASOS PCT: 1 %
Basophils Absolute: 0.1 10*3/uL (ref 0–0.1)
Eosinophils Absolute: 0.1 10*3/uL (ref 0–0.7)
Eosinophils Relative: 2 %
HEMATOCRIT: 33.2 % — AB (ref 40.0–52.0)
Hemoglobin: 11.5 g/dL — ABNORMAL LOW (ref 13.0–18.0)
LYMPHS ABS: 1.1 10*3/uL (ref 1.0–3.6)
Lymphocytes Relative: 14 %
MCH: 31.8 pg (ref 26.0–34.0)
MCHC: 34.7 g/dL (ref 32.0–36.0)
MCV: 91.6 fL (ref 80.0–100.0)
MONO ABS: 1.1 10*3/uL — AB (ref 0.2–1.0)
MONOS PCT: 14 %
NEUTROS ABS: 5.2 10*3/uL (ref 1.4–6.5)
Neutrophils Relative %: 69 %
Platelets: 298 10*3/uL (ref 150–440)
RBC: 3.62 MIL/uL — ABNORMAL LOW (ref 4.40–5.90)
RDW: 16.7 % — AB (ref 11.5–14.5)
WBC: 7.6 10*3/uL (ref 3.8–10.6)

## 2015-12-23 LAB — COMPREHENSIVE METABOLIC PANEL
ALT: 9 U/L — ABNORMAL LOW (ref 17–63)
ANION GAP: 7 (ref 5–15)
AST: 16 U/L (ref 15–41)
Albumin: 4 g/dL (ref 3.5–5.0)
Alkaline Phosphatase: 64 U/L (ref 38–126)
BUN: 12 mg/dL (ref 6–20)
CHLORIDE: 102 mmol/L (ref 101–111)
CO2: 26 mmol/L (ref 22–32)
Calcium: 9.1 mg/dL (ref 8.9–10.3)
Creatinine, Ser: 1.1 mg/dL (ref 0.61–1.24)
GFR calc non Af Amer: >60 mL/min (ref >60)
Glucose, Bld: 75 mg/dL (ref 65–99)
Potassium: 3.7 mmol/L (ref 3.5–5.1)
Sodium: 135 mmol/L (ref 135–145)
Total Bilirubin: 0.4 mg/dL (ref 0.3–1.2)
Total Protein: 7.8 g/dL (ref 6.5–8.1)

## 2015-12-23 MED ORDER — FOSAPREPITANT DIMEGLUMINE INJECTION 150 MG
Freq: Once | INTRAVENOUS | Status: AC
  Administered 2015-12-23: 11:00:00 via INTRAVENOUS
  Filled 2015-12-23: qty 5

## 2015-12-23 MED ORDER — SODIUM CHLORIDE 0.9 % IV SOLN
358.4000 mg | Freq: Once | INTRAVENOUS | Status: AC
  Administered 2015-12-23: 360 mg via INTRAVENOUS
  Filled 2015-12-23: qty 36

## 2015-12-23 MED ORDER — PALONOSETRON HCL INJECTION 0.25 MG/5ML
0.2500 mg | Freq: Once | INTRAVENOUS | Status: AC
  Administered 2015-12-23: 0.25 mg via INTRAVENOUS
  Filled 2015-12-23: qty 5

## 2015-12-23 MED ORDER — SODIUM CHLORIDE 0.9 % IV SOLN
Freq: Once | INTRAVENOUS | Status: AC
  Administered 2015-12-23: 10:00:00 via INTRAVENOUS
  Filled 2015-12-23: qty 1000

## 2015-12-23 MED ORDER — HEPARIN SOD (PORK) LOCK FLUSH 100 UNIT/ML IV SOLN
INTRAVENOUS | Status: AC
  Filled 2015-12-23: qty 5

## 2015-12-23 MED ORDER — SODIUM CHLORIDE 0.9 % IV SOLN
80.0000 mg/m2 | Freq: Once | INTRAVENOUS | Status: AC
  Administered 2015-12-23: 140 mg via INTRAVENOUS
  Filled 2015-12-23: qty 7

## 2015-12-23 NOTE — Progress Notes (Signed)
Patient is having a constant headache that is 9/10 on pain scale.  His family is concerned because of the headaches, loss of appetite with weight loss, and change in behavior.

## 2015-12-24 ENCOUNTER — Inpatient Hospital Stay: Payer: Medicaid Other

## 2015-12-24 VITALS — BP 105/62 | HR 79 | Temp 96.8°F | Resp 20

## 2015-12-24 DIAGNOSIS — Z5111 Encounter for antineoplastic chemotherapy: Secondary | ICD-10-CM | POA: Diagnosis not present

## 2015-12-24 DIAGNOSIS — C3491 Malignant neoplasm of unspecified part of right bronchus or lung: Secondary | ICD-10-CM

## 2015-12-24 MED ORDER — SODIUM CHLORIDE 0.9 % IV SOLN
Freq: Once | INTRAVENOUS | Status: AC
  Administered 2015-12-24: 14:00:00 via INTRAVENOUS
  Filled 2015-12-24: qty 1000

## 2015-12-24 MED ORDER — DEXAMETHASONE SODIUM PHOSPHATE 100 MG/10ML IJ SOLN
10.0000 mg | Freq: Once | INTRAMUSCULAR | Status: AC
  Administered 2015-12-24: 10 mg via INTRAVENOUS
  Filled 2015-12-24: qty 1

## 2015-12-24 MED ORDER — HEPARIN SOD (PORK) LOCK FLUSH 100 UNIT/ML IV SOLN
INTRAVENOUS | Status: AC
  Filled 2015-12-24: qty 5

## 2015-12-24 MED ORDER — SODIUM CHLORIDE 0.9% FLUSH
10.0000 mL | Freq: Once | INTRAVENOUS | Status: AC
  Administered 2015-12-24: 10 mL via INTRAVENOUS
  Filled 2015-12-24: qty 10

## 2015-12-24 MED ORDER — HEPARIN SOD (PORK) LOCK FLUSH 100 UNIT/ML IV SOLN
500.0000 [IU] | Freq: Once | INTRAVENOUS | Status: AC
  Administered 2015-12-24: 500 [IU] via INTRAVENOUS

## 2015-12-24 MED ORDER — SODIUM CHLORIDE 0.9 % IV SOLN
80.0000 mg/m2 | Freq: Once | INTRAVENOUS | Status: AC
  Administered 2015-12-24: 140 mg via INTRAVENOUS
  Filled 2015-12-24: qty 7

## 2015-12-25 ENCOUNTER — Inpatient Hospital Stay: Payer: Medicaid Other

## 2015-12-25 VITALS — BP 99/58 | HR 73 | Temp 97.3°F | Resp 20

## 2015-12-25 DIAGNOSIS — Z5111 Encounter for antineoplastic chemotherapy: Secondary | ICD-10-CM | POA: Diagnosis not present

## 2015-12-25 DIAGNOSIS — C3491 Malignant neoplasm of unspecified part of right bronchus or lung: Secondary | ICD-10-CM

## 2015-12-25 MED ORDER — SODIUM CHLORIDE 0.9% FLUSH
10.0000 mL | INTRAVENOUS | Status: DC | PRN
  Administered 2015-12-25: 10 mL
  Filled 2015-12-25: qty 10

## 2015-12-25 MED ORDER — PEGFILGRASTIM 6 MG/0.6ML ~~LOC~~ PSKT
6.0000 mg | PREFILLED_SYRINGE | Freq: Once | SUBCUTANEOUS | Status: AC
  Administered 2015-12-25: 6 mg via SUBCUTANEOUS
  Filled 2015-12-25: qty 0.6

## 2015-12-25 MED ORDER — SODIUM CHLORIDE 0.9 % IV SOLN
80.0000 mg/m2 | Freq: Once | INTRAVENOUS | Status: AC
  Administered 2015-12-25: 140 mg via INTRAVENOUS
  Filled 2015-12-25: qty 7

## 2015-12-25 MED ORDER — HEPARIN SOD (PORK) LOCK FLUSH 100 UNIT/ML IV SOLN
500.0000 [IU] | Freq: Once | INTRAVENOUS | Status: AC | PRN
  Administered 2015-12-25: 500 [IU]
  Filled 2015-12-25: qty 5

## 2015-12-25 MED ORDER — SODIUM CHLORIDE 0.9 % IV SOLN
Freq: Once | INTRAVENOUS | Status: AC
  Administered 2015-12-25: 14:00:00 via INTRAVENOUS
  Filled 2015-12-25: qty 1000

## 2015-12-25 MED ORDER — SODIUM CHLORIDE 0.9 % IV SOLN
10.0000 mg | Freq: Once | INTRAVENOUS | Status: AC
  Administered 2015-12-25: 10 mg via INTRAVENOUS
  Filled 2015-12-25: qty 1

## 2015-12-28 NOTE — Progress Notes (Signed)
[place]  Telephone:(336) [phone] Fax:(336) [phone]  ID: ASHAAD GAERTNER OB: [birth year]  MR#: [id number]  IZT#:[id number]  Patient Care Team: Lloyd Huger, MD as PCP - General (Oncology)  CHIEF COMPLAINT:  Chief Complaint  Patient presents with  . Lung Cancer    INTERVAL HISTORY: Patient returns to clinic today for further evaluation and reconsideration of cycle 4 of 4 of cisplatin and etoposide. He skipped his appointment last week because he "did not feel well". He continues to have persistent headache.  He has a poor appetite and continues to lose weight. He continues to have pain, but is vague when describing the location. He has no other neurologic complaints. He denies any fevers. He denies any chest pain or hemoptysis. He denies any nausea, vomiting, constipation, or diarrhea. He has no urinary complaints. Patient offers no further specific complaints.  REVIEW OF SYSTEMS:   Review of Systems  Constitutional: Positive for weight loss and malaise/fatigue. Negative for fever.  HENT: Positive for tinnitus.   Respiratory: Positive for cough and shortness of breath. Negative for hemoptysis.   Cardiovascular: Negative.  Negative for chest pain.  Gastrointestinal: Negative.   Musculoskeletal: Positive for joint pain.  Neurological: Positive for weakness and headaches.  Psychiatric/Behavioral: Negative.     As per HPI. Otherwise, a complete review of systems is negatve.  PAST MEDICAL HISTORY: Past Medical History  Diagnosis Date  . History of melanoma   . History of squamous cell carcinoma   . COPD (chronic obstructive pulmonary disease) ([place])   . Cancer [facility])     PAST SURGICAL HISTORY: Past Surgical History  Procedure Laterality Date  . Skin graft      [facility]  . Peripheral vascular catheterization N/A 09/15/2015    Procedure: Glori Luis Cath Insertion;  Surgeon: Algernon Huxley, MD;  Location: [place] [facility];  Service: Cardiovascular;  Laterality:  N/A;    FAMILY HISTORY: Reviewed and unchanged. No reported history of malignancy or chronic disease.     ADVANCED DIRECTIVES:    HEALTH MAINTENANCE: Social History  Substance Use Topics  . Smoking status: Current Every Day Smoker -- 1.00 packs/day for 51 years    Types: Cigarettes    Start date: 08/12/1994  . Smokeless tobacco: Never Used  . Alcohol Use: No     Colonoscopy:  PAP:  Bone density:  Lipid panel:  Allergies  Allergen Reactions  . Vicodin [Hydrocodone-Acetaminophen] Nausea Only    Current Outpatient Prescriptions  Medication Sig Dispense Refill  . acetaminophen (TYLENOL) 325 MG tablet Take 325 mg by mouth every 6 (six) hours as needed.    Marland Kitchen albuterol (PROVENTIL HFA;VENTOLIN HFA) 108 (90 Base) MCG/ACT inhaler Inhale 2 puffs into the lungs every 6 (six) hours as needed for wheezing or shortness of breath. 1 Inhaler 2  . lidocaine-prilocaine (EMLA) cream Apply to affected area once 30 g 3  . ondansetron (ZOFRAN) 8 MG tablet Take 1 tablet (8 mg total) by mouth 2 (two) times daily as needed. Start on the third day after cisplatin chemotherapy. 30 tablet 1  . oxyCODONE-acetaminophen (ROXICET) 5-325 MG tablet Take 1 tablet by mouth every 6 (six) hours as needed. 84 tablet 0  . prochlorperazine (COMPAZINE) 10 MG tablet Take 1 tablet (10 mg total) by mouth every 6 (six) hours as needed (Nausea or vomiting). 30 tablet 1  . senna-docusate (SENOKOT-S) 8.6-50 MG tablet Take 2 tablets by mouth at bedtime as needed for mild constipation. 20 tablet 0   No current  facility-administered medications for this visit.   Facility-Administered Medications Ordered in Other Visits  Medication Dose Route Frequency Provider Last Rate Last Dose  . 0.9 %  sodium chloride infusion   Intravenous Once Lloyd Huger, MD        OBJECTIVE: Filed Vitals:   12/23/15 0902  BP: 137/77  Pulse: 77  Temp: 96.3 F (35.7 C)  Resp: 18     Body mass index is 19.1 kg/(m^2).    ECOG FS:0 -  Asymptomatic  General: Thin, no acute distress. Eyes: Pink conjunctiva, anicteric sclera. Lungs: Clear to auscultation bilaterally. Heart: Regular rate and rhythm. No rubs, murmurs, or gallops. Abdomen: Soft, nontender, nondistended. No organomegaly noted, normoactive bowel sounds. Musculoskeletal: No edema, cyanosis, or clubbing. Neuro: Alert, answering all questions appropriately. Cranial nerves grossly intact. Skin: No rashes or petechiae noted. Psych: Normal affect.  LAB RESULTS:  Lab Results  Component Value Date   NA 135 12/23/2015   K 3.7 12/23/2015   CL 102 12/23/2015   CO2 26 12/23/2015   GLUCOSE 75 12/23/2015   BUN 12 12/23/2015   CREATININE 1.10 12/23/2015   CALCIUM 9.1 12/23/2015   PROT 7.8 12/23/2015   ALBUMIN 4.0 12/23/2015   AST 16 12/23/2015   ALT 9* 12/23/2015   ALKPHOS 64 12/23/2015   BILITOT 0.4 12/23/2015   GFRNONAA >60 12/23/2015   GFRAA >60 12/23/2015    Lab Results  Component Value Date   WBC 7.6 12/23/2015   NEUTROABS 5.2 12/23/2015   HGB 11.5* 12/23/2015   HCT 33.2* 12/23/2015   MCV 91.6 12/23/2015   PLT 298 12/23/2015     STUDIES: Dg Abd 1 View  12/02/2015  CLINICAL DATA:  Acute onset of bright red blood in stool. Initial encounter. EXAM: ABDOMEN - 1 VIEW COMPARISON:  PET/CT performed 08/21/2015 FINDINGS: The visualized bowel gas pattern is unremarkable. Scattered air and stool filled loops of colon are seen; no abnormal dilatation of small bowel loops is seen to suggest small bowel obstruction. No free intra-abdominal air is identified, though evaluation for free air is limited on a single supine view. The visualized osseous structures are within normal limits; the sacroiliac joints are unremarkable in appearance. The visualized lung bases are essentially clear. IMPRESSION: Unremarkable bowel gas pattern; no free intra-abdominal air seen. Small amount of stool noted in the colon. Electronically Signed   By: Garald Balding M.D.   On: 12/02/2015  01:11    ASSESSMENT: Stage IIa small cell carcinoma of the right lung.  PLAN:    1. Small cell lung cancer: PET scan and pathology results reviewed independently. Initial MRI the brain negative for metastatic disease, but will repeat given his persistent headache. Proceed with cycle 4 which will be carboplatinum and etoposide. Cisplatin was discontinued secondary to tinnitus. Return to clinic in 1 and 2 days for etoposide only patient will also receive OnPro Neulasta on day 3 of etoposide. Will reimage with PET scan in approximately 4 weeks with follow-up 1-2 days later. At the conclusion of his treatment, he will also require PCI.  2. Weight loss: Likely multifactorial, unclear how much. Monitor. 3. History of melanoma: Patient is a poor historian but appears to have been greater than 3 years ago. Monitor. 4. Pain: Continue current narcotic regimen. Monitor closely, there is suspicion of drug-seeking behavior.  5. Neutropenia: Resolved. OnPro Neulasta as above. 6. Cough: Recommended patient try OTC medications. Patient was also given a prescription for albuterol and a pulmonary referral. 7. Tinnitus: Likely secondary to  cisplatin. Switch to carboplatinum as above. 8. Headaches: Repeat MRI as above.   Patient expressed understanding and was in agreement with this plan. He also understands that He can call clinic at any time with any questions, concerns, or complaints.   Lloyd Huger, MD   12/28/2015 8:14 AM

## 2015-12-30 ENCOUNTER — Other Ambulatory Visit: Payer: Self-pay | Admitting: *Deleted

## 2015-12-30 DIAGNOSIS — C3491 Malignant neoplasm of unspecified part of right bronchus or lung: Secondary | ICD-10-CM

## 2015-12-30 MED ORDER — OXYCODONE HCL 10 MG PO TABS: 10.0000 mg | ORAL_TABLET | Freq: Four times a day (QID) | ORAL | Status: DC | PRN

## 2015-12-31 ENCOUNTER — Encounter: Payer: Self-pay | Admitting: Emergency Medicine

## 2015-12-31 ENCOUNTER — Inpatient Hospital Stay
Admission: EM | Admit: 2015-12-31 | Discharge: 2016-01-02 | DRG: 872 | Disposition: A | Discharge status: Home / Self Care | Payer: Medicaid Other | Attending: Internal Medicine | Admitting: Internal Medicine

## 2015-12-31 ENCOUNTER — Emergency Department: Payer: Medicaid Other

## 2015-12-31 DIAGNOSIS — C3491 Malignant neoplasm of unspecified part of right bronchus or lung: Secondary | ICD-10-CM | POA: Diagnosis present

## 2015-12-31 DIAGNOSIS — J440 Chronic obstructive pulmonary disease with acute lower respiratory infection: Secondary | ICD-10-CM | POA: Diagnosis present

## 2015-12-31 DIAGNOSIS — Z79899 Other long term (current) drug therapy: Secondary | ICD-10-CM

## 2015-12-31 DIAGNOSIS — R51 Headache: Secondary | ICD-10-CM | POA: Diagnosis present

## 2015-12-31 DIAGNOSIS — J209 Acute bronchitis, unspecified: Secondary | ICD-10-CM | POA: Diagnosis present

## 2015-12-31 DIAGNOSIS — I248 Other forms of acute ischemic heart disease: Secondary | ICD-10-CM | POA: Diagnosis present

## 2015-12-31 DIAGNOSIS — F17210 Nicotine dependence, cigarettes, uncomplicated: Secondary | ICD-10-CM | POA: Diagnosis present

## 2015-12-31 DIAGNOSIS — Z85820 Personal history of malignant melanoma of skin: Secondary | ICD-10-CM | POA: Diagnosis not present

## 2015-12-31 DIAGNOSIS — C3490 Malignant neoplasm of unspecified part of unspecified bronchus or lung: Secondary | ICD-10-CM

## 2015-12-31 DIAGNOSIS — D649 Anemia, unspecified: Secondary | ICD-10-CM | POA: Diagnosis not present

## 2015-12-31 DIAGNOSIS — A419 Sepsis, unspecified organism: Secondary | ICD-10-CM | POA: Diagnosis not present

## 2015-12-31 DIAGNOSIS — R519 Headache, unspecified: Secondary | ICD-10-CM

## 2015-12-31 HISTORY — DX: Tobacco use: Z72.0

## 2015-12-31 HISTORY — DX: Malignant neoplasm of unspecified part of unspecified bronchus or lung: C34.90

## 2015-12-31 LAB — URINALYSIS COMPLETE WITH MICROSCOPIC (ARMC ONLY)
BACTERIA UA: NONE SEEN
Bilirubin Urine: NEGATIVE
Glucose, UA: 50 mg/dL — AB
KETONES UR: NEGATIVE mg/dL
Leukocytes, UA: NEGATIVE
NITRITE: NEGATIVE
PH: 5 (ref 5.0–8.0)
PROTEIN: NEGATIVE mg/dL
SPECIFIC GRAVITY, URINE: 1.025 (ref 1.005–1.030)

## 2015-12-31 LAB — COMPREHENSIVE METABOLIC PANEL
ALBUMIN: 3.9 g/dL (ref 3.5–5.0)
ALK PHOS: 111 U/L (ref 38–126)
ALT: 12 U/L — ABNORMAL LOW (ref 17–63)
AST: 18 U/L (ref 15–41)
Anion gap: 8 (ref 5–15)
BILIRUBIN TOTAL: 0.3 mg/dL (ref 0.3–1.2)
BUN: 14 mg/dL (ref 6–20)
CALCIUM: 9 mg/dL (ref 8.9–10.3)
CO2: 25 mmol/L (ref 22–32)
CREATININE: 0.97 mg/dL (ref 0.61–1.24)
Chloride: 102 mmol/L (ref 101–111)
GFR calc Af Amer: >60 mL/min (ref >60)
GLUCOSE: 120 mg/dL — AB (ref 65–99)
Potassium: 3.9 mmol/L (ref 3.5–5.1)
Sodium: 135 mmol/L (ref 135–145)
TOTAL PROTEIN: 7.2 g/dL (ref 6.5–8.1)

## 2015-12-31 LAB — RAPID INFLUENZA A&B ANTIGENS: Influenza B (ARMC): NEGATIVE

## 2015-12-31 LAB — TROPONIN I
TROPONIN I: 0.52 ng/mL — AB (ref <0.031)
Troponin I: 0.08 ng/mL — ABNORMAL HIGH (ref <0.031)

## 2015-12-31 LAB — BRAIN NATRIURETIC PEPTIDE: B NATRIURETIC PEPTIDE 5: 322 pg/mL — AB (ref 0.0–100.0)

## 2015-12-31 LAB — CBC WITH DIFFERENTIAL/PLATELET
Basophils Absolute: 0.1 10*3/uL (ref 0–0.1)
EOS ABS: 0.1 10*3/uL (ref 0–0.7)
HCT: 31 % — ABNORMAL LOW (ref 40.0–52.0)
HEMOGLOBIN: 10.3 g/dL — AB (ref 13.0–18.0)
LYMPHS ABS: 0.5 10*3/uL — AB (ref 1.0–3.6)
Lymphocytes Relative: 3 %
MCH: 30.9 pg (ref 26.0–34.0)
MCHC: 33.2 g/dL (ref 32.0–36.0)
MCV: 93.1 fL (ref 80.0–100.0)
MONO ABS: 0.9 10*3/uL (ref 0.2–1.0)
Neutro Abs: 14.1 10*3/uL — ABNORMAL HIGH (ref 1.4–6.5)
Platelets: 174 10*3/uL (ref 150–440)
RBC: 3.33 MIL/uL — ABNORMAL LOW (ref 4.40–5.90)
RDW: 16.4 % — AB (ref 11.5–14.5)
WBC: 15.6 10*3/uL — ABNORMAL HIGH (ref 3.8–10.6)

## 2015-12-31 LAB — PROTIME-INR
INR: 1.01
Prothrombin Time: 13.5 seconds (ref 11.4–15.0)

## 2015-12-31 LAB — LACTIC ACID, PLASMA: Lactic Acid, Venous: 1 mmol/L (ref 0.5–2.0)

## 2015-12-31 LAB — APTT: APTT: 39 s — AB (ref 24–36)

## 2015-12-31 LAB — RAPID INFLUENZA A&B ANTIGENS (ARMC ONLY): INFLUENZA A (ARMC): NEGATIVE

## 2015-12-31 MED ORDER — ONDANSETRON HCL 4 MG PO TABS: 4.0000 mg | ORAL_TABLET | Freq: Four times a day (QID) | ORAL | Status: DC | PRN

## 2015-12-31 MED ORDER — ENOXAPARIN SODIUM 40 MG/0.4ML ~~LOC~~ SOLN
40.0000 mg | SUBCUTANEOUS | Status: DC
  Administered 2015-12-31 – 2016-01-01 (×2): 40 mg via SUBCUTANEOUS
  Filled 2015-12-31 (×2): qty 0.4

## 2015-12-31 MED ORDER — VANCOMYCIN HCL IN DEXTROSE 1-5 GM/200ML-% IV SOLN
1000.0000 mg | Freq: Once | INTRAVENOUS | Status: AC
  Administered 2015-12-31: 1000 mg via INTRAVENOUS
  Filled 2015-12-31: qty 200

## 2015-12-31 MED ORDER — DOCUSATE SODIUM 100 MG PO CAPS
100.0000 mg | ORAL_CAPSULE | Freq: Two times a day (BID) | ORAL | Status: DC
  Administered 2015-12-31 – 2016-01-02 (×3): 100 mg via ORAL
  Filled 2015-12-31 (×4): qty 1

## 2015-12-31 MED ORDER — ACETAMINOPHEN 650 MG RE SUPP: 650.0000 mg | Freq: Four times a day (QID) | RECTAL | Status: DC | PRN

## 2015-12-31 MED ORDER — NICOTINE 21 MG/24HR TD PT24
21.0000 mg | MEDICATED_PATCH | Freq: Every day | TRANSDERMAL | Status: DC
  Filled 2015-12-31 (×3): qty 1

## 2015-12-31 MED ORDER — ACETAMINOPHEN 325 MG PO TABS: 650.0000 mg | ORAL_TABLET | Freq: Four times a day (QID) | ORAL | Status: DC | PRN

## 2015-12-31 MED ORDER — LEVOFLOXACIN IN D5W 750 MG/150ML IV SOLN
750.0000 mg | INTRAVENOUS | Status: DC
  Administered 2015-12-31 – 2016-01-01 (×2): 750 mg via INTRAVENOUS
  Filled 2015-12-31 (×3): qty 150

## 2015-12-31 MED ORDER — IPRATROPIUM-ALBUTEROL 0.5-2.5 (3) MG/3ML IN SOLN
3.0000 mL | RESPIRATORY_TRACT | Status: DC
  Administered 2015-12-31: 21:00:00 3 mL via RESPIRATORY_TRACT
  Filled 2015-12-31: qty 3

## 2015-12-31 MED ORDER — ONDANSETRON HCL 4 MG/2ML IJ SOLN: 4.0000 mg | Freq: Four times a day (QID) | INTRAMUSCULAR | Status: DC | PRN

## 2015-12-31 MED ORDER — POLYETHYLENE GLYCOL 3350 17 G PO PACK: 17.0000 g | PACK | Freq: Every day | ORAL | Status: DC | PRN

## 2015-12-31 MED ORDER — MOMETASONE FURO-FORMOTEROL FUM 200-5 MCG/ACT IN AERO
2.0000 | INHALATION_SPRAY | Freq: Two times a day (BID) | RESPIRATORY_TRACT | Status: DC
  Administered 2015-12-31 – 2016-01-02 (×4): 2 via RESPIRATORY_TRACT
  Filled 2015-12-31: qty 8.8

## 2015-12-31 MED ORDER — ASPIRIN 81 MG PO CHEW
324.0000 mg | CHEWABLE_TABLET | Freq: Once | ORAL | Status: AC
  Administered 2015-12-31: 324 mg via ORAL
  Filled 2015-12-31: qty 4

## 2015-12-31 MED ORDER — PIPERACILLIN-TAZOBACTAM 3.375 G IVPB
3.3750 g | Freq: Once | INTRAVENOUS | Status: AC
  Administered 2015-12-31: 3.375 g via INTRAVENOUS
  Filled 2015-12-31: qty 50

## 2015-12-31 MED ORDER — OXYCODONE HCL 5 MG PO TABS
10.0000 mg | ORAL_TABLET | Freq: Four times a day (QID) | ORAL | Status: DC | PRN
  Administered 2016-01-01: 10 mg via ORAL
  Filled 2015-12-31: qty 2

## 2015-12-31 MED ORDER — MORPHINE SULFATE (PF) 2 MG/ML IV SOLN: 2.0000 mg | INTRAVENOUS | Status: DC | PRN

## 2015-12-31 MED ORDER — IPRATROPIUM-ALBUTEROL 0.5-2.5 (3) MG/3ML IN SOLN
3.0000 mL | Freq: Four times a day (QID) | RESPIRATORY_TRACT | Status: DC
  Filled 2015-12-31: qty 3

## 2015-12-31 MED ORDER — BENZONATATE 100 MG PO CAPS
100.0000 mg | ORAL_CAPSULE | Freq: Three times a day (TID) | ORAL | Status: DC
  Administered 2015-12-31 – 2016-01-02 (×6): 100 mg via ORAL
  Filled 2015-12-31 (×6): qty 1

## 2015-12-31 MED ORDER — IBUPROFEN 400 MG PO TABS
400.0000 mg | ORAL_TABLET | Freq: Once | ORAL | Status: AC
  Administered 2015-12-31: 400 mg via ORAL
  Filled 2015-12-31: qty 1

## 2015-12-31 MED ORDER — SODIUM CHLORIDE 0.9 % IV SOLN
INTRAVENOUS | Status: DC
  Administered 2015-12-31 – 2016-01-02 (×3): via INTRAVENOUS

## 2015-12-31 MED ORDER — SODIUM CHLORIDE 0.9 % IV BOLUS (SEPSIS)
1742.0000 mL | Freq: Once | INTRAVENOUS | Status: AC
  Administered 2015-12-31: 1000 mL via INTRAVENOUS

## 2015-12-31 NOTE — ED Notes (Signed)
Patient presents to the ED with cough, congestion, and fever.  Patient was diagnosed with lung cancer about 1 year ago and got his last chemo treatment on Thursday.  Patient had a '10mg'$  percocet today at noon.

## 2015-12-31 NOTE — H&P (Signed)
David [name] at [place] NAME: David [name]    MR#:  [id number]  DATE OF BIRTH:  1956-06-13  DATE OF ADMISSION:  12/31/2015  PRIMARY CARE PHYSICIAN: Lloyd Huger, MD   REQUESTING/REFERRING PHYSICIAN: Dr. Loura Pardon  CHIEF COMPLAINT:   Chief Complaint  Patient presents with  . Code Sepsis    HISTORY OF PRESENT ILLNESS:  David [name]  is a 60 y.o. 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 Patient was having cold and congestion in spite of treatment last year and chest x-ray at the time revealed right lung nodule and biopsy confirmed small cell lung cancer. He has been on treatment with cisplatin and etoposide. His last chemotherapy was 3 days ago. He continues to have congestion, worsening headaches and dry cough. Denies any fevers or chills up until this morning. When he woke up he was shaking with chills and so came to the emergency room. Had a temperature of 102F here. Elevated white count and tachycardia. Urine analysis negative for any infection, though Patient has increased frequency of urination. Chest x-ray without any infiltrate. He is being admitted for sepsis.  PAST MEDICAL HISTORY:   Past Medical History  Diagnosis Date  . History of melanoma   . COPD (chronic obstructive pulmonary disease) (HCC)     not on home o2  . Small cell lung cancer ([place])   . Tobacco use     PAST SURGICAL HISTORY:   Past Surgical History  Procedure Laterality Date  . Skin graft      [facility]  . Peripheral vascular catheterization N/A 09/15/2015    Procedure: Glori Luis Cath Insertion;  Surgeon: Algernon Huxley, MD;  Location: [facility];  Service: Cardiovascular;  Laterality: N/A;    SOCIAL HISTORY:   Social History  Substance Use Topics  . Smoking status: Current Every Day Smoker -- 1.00  packs/day for 51 years    Types: Cigarettes    Start date: 08/12/1994  . Smokeless tobacco: Never Used     Comment: used to smoke upto 2PPD, recently cut down  . Alcohol Use: No    FAMILY HISTORY:   Family History  Problem Relation Age of Onset  . Lung cancer Mother   . Cirrhosis Father     DRUG ALLERGIES:   Allergies  Allergen Reactions  . Vicodin [Hydrocodone-Acetaminophen] Nausea Only    REVIEW OF SYSTEMS:   Review of Systems  Constitutional: Positive for malaise/fatigue. Negative for fever, chills and weight loss.  HENT: Positive for congestion. Negative for ear discharge, ear pain, hearing loss and nosebleeds.   Eyes: Negative for blurred vision, double vision and photophobia.  Respiratory: Positive for cough. Negative for hemoptysis, shortness of breath and wheezing.   Cardiovascular: Positive for chest pain. Negative for palpitations, orthopnea and leg swelling.       Chest pain from coughing  Gastrointestinal: Positive for nausea and vomiting. Negative for heartburn, abdominal pain, diarrhea, constipation and melena.  Genitourinary: Negative for dysuria, urgency, frequency and hematuria.  Musculoskeletal: Positive for myalgias and back pain. Negative for neck pain.  Skin: Negative for rash.  Neurological: Positive for headaches. Negative for dizziness, tingling, sensory change, speech change and focal weakness.  Endo/Heme/Allergies: Does not bruise/bleed easily.  Psychiatric/Behavioral: Negative for depression.    MEDICATIONS AT HOME:   Prior to Admission medications  Medication Sig Start Date End Date Taking? Authorizing Provider  albuterol (PROVENTIL HFA;VENTOLIN HFA) 108 (90 Base) MCG/ACT inhaler Inhale 2 puffs into the lungs every 6 (six) hours as needed for wheezing or shortness of breath. 12/08/15  Yes Lloyd Huger, MD  ibuprofen (ADVIL,MOTRIN) 200 MG tablet Take 200 mg by mouth every 6 (six) hours as needed.   Yes Historical Provider, MD  ondansetron  (ZOFRAN) 8 MG tablet Take 1 tablet (8 mg total) by mouth 2 (two) times daily as needed. Start on the third day after cisplatin chemotherapy. 09/15/15  Yes Lloyd Huger, MD  Oxycodone HCl 10 MG TABS Take 1 tablet (10 mg total) by mouth every 6 (six) hours as needed. 12/30/15  Yes Lloyd Huger, MD  prochlorperazine (COMPAZINE) 10 MG tablet Take 1 tablet (10 mg total) by mouth every 6 (six) hours as needed (Nausea or vomiting). 09/15/15  Yes Lloyd Huger, MD      VITAL SIGNS:  Blood pressure 102/72, pulse 95, temperature 99.1 F (37.3 C), temperature source Oral, resp. rate 18, height '5\' 9"'$  (1.753 m), weight 58.06 kg (128 lb), SpO2 98 %.  PHYSICAL EXAMINATION:   Physical Exam  GENERAL:  60 y.o.-year-old patient lying in the bed with no acute distress.  EYES: Pupils equal, round, reactive to light and accommodation. No scleral icterus. Extraocular muscles intact.  HEENT: Head atraumatic, normocephalic. Oropharynx and nasopharynx clear.  NECK:  Supple, no jugular venous distention. No thyroid enlargement, no tenderness.  LUNGS: Tight and scant breath sounds bilaterally, scattered wheezing, no rales,rhonchi or crepitation. No use of accessory muscles of respiration.  CARDIOVASCULAR: S1, S2 normal. No murmurs, rubs, or gallops.  ABDOMEN: Soft, nontender, nondistended. Bowel sounds present. No organomegaly or mass.  EXTREMITIES: No pedal edema, cyanosis, or clubbing.  NEUROLOGIC: Cranial nerves II through XII are intact. Muscle strength 5/5 in all extremities. Sensation intact. Gait not checked.  PSYCHIATRIC: The patient is alert and oriented x 3.  SKIN: No obvious rash, lesion, or ulcer.   LABORATORY PANEL:   CBC  Recent Labs Lab 12/31/15 1233  WBC 15.6*  HGB 10.3*  HCT 31.0*  PLT 174   ------------------------------------------------------------------------------------------------------------------  Chemistries   Recent Labs Lab 12/31/15 1233  NA 135  K 3.9  CL  102  CO2 25  GLUCOSE 120*  BUN 14  CREATININE 0.97  CALCIUM 9.0  AST 18  ALT 12*  ALKPHOS 111  BILITOT 0.3   ------------------------------------------------------------------------------------------------------------------  Cardiac Enzymes  Recent Labs Lab 12/31/15 1233  TROPONINI 0.08*   ------------------------------------------------------------------------------------------------------------------  RADIOLOGY:  Dg Chest 2 View  12/31/2015  CLINICAL DATA:  Cough, congestion, fever EXAM: CHEST  2 VIEW COMPARISON:  10/01/2015 FINDINGS: The lungs are hyperinflated likely secondary to COPD. There is no focal parenchymal opacity. There is no pneumothorax or pleural effusion. Previously demonstrated nodule has significantly regressed in size with a small area of spiculation measuring approximately 5 mm at the site of the previously demonstrated right upper lobe nodule. The heart and mediastinal contours are unremarkable. Right-sided Port-A-Cath in satisfactory position. The osseous structures are unremarkable. IMPRESSION: 1. No active cardiopulmonary disease. 2. Previously demonstrated nodule has significantly regressed in size with a small area of spiculation measuring approximately 5 mm at the site of the previously demonstrated right upper lobe nodule. Electronically Signed   By: Kathreen Devoid   On: 12/31/2015 14:08    EKG:   Orders placed or performed during the hospital encounter of 12/31/15  . ED EKG 12-Lead  .  ED EKG 12-Lead  . EKG 12-Lead  . EKG 12-Lead    IMPRESSION AND PLAN:   David [name]  is a 60 y.o. 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  #1 sepsis-fever, white count and tachycardia. -Currently on chemotherapy. Influenza test is pending due to respiratory symptoms. Chest x-ray with no infiltrate, still could be  bronchitis. -Blood cultures are pending -Started on Levaquin. IV fluids and antibiotics and monitor.  #2 elevated troponin-likely demand ischemia. No chest pain. -Recycle troponins  #3 COPD-scant breath sounds on exam. No hypoxia noted. -Started on inhalers and nebulizer. -No indication for systemic steroids at this time.  #4 ongoing smoking-counseled, not ready to quit. Slowly cutting down. Started on nicotine patch.  #5 small cell lung cancer-oncology consulted. Patient on carboplatin and etoposide. Last chemotherapy 3 days ago. -Leukocytosis could also be from Neulasta. -Repeat PET scan in a month. -MRI of the brain will be ordered given his persistent headaches.  #6 DVT prophylaxis-Lovenox    All the records are reviewed and case discussed with ED provider. Management plans discussed with the patient, family and they are in agreement.  CODE STATUS: Full Code  TOTAL TIME TAKING CARE OF THIS PATIENT: 50 minutes.    Gladstone Lighter M.D on 12/31/2015 at 4:21 PM  Between 7am to 6pm - Pager - [phone]  After 6pm go to [email or web address] - password [email or web address]  Office  [phone]  CC: Primary care physician; Lloyd Huger, MD

## 2015-12-31 NOTE — Progress Notes (Signed)
Pharmacy Antibiotic Note  David [name] is a 60 y.o. male on chemotherapy admitted on 12/31/2015 with sepsis.  Pharmacy has been consulted for Levaquin dosing.  Plan: Patient received one-time doses of Zosyn and vancomycin in ED. Will begin Levaquin 750 mg iv q 24 hours.   Height: '5\' 9"'$  (175.3 cm) Weight: 128 lb (58.06 kg) IBW/kg (Calculated) : 70.7  Temp (24hrs), Avg:101 F (38.3 C), Min:99.1 F (37.3 C), Max:102.9 F (39.4 C)   Recent Labs Lab 12/31/15 1233 12/31/15 1356  WBC 15.6*  --   CREATININE 0.97  --   LATICACIDVEN  --  1.0    Estimated Creatinine Clearance: 68.2 mL/min (by C-G formula based on Cr of 0.97).    Allergies  Allergen Reactions  . Vicodin [Hydrocodone-Acetaminophen] Nausea Only    Antimicrobials this admission: 5/24 vancomycin and Zosyn >> x 1 5/24 Levaquin >>   Dose adjustments this admission:   Microbiology results: 5/24 BCx: pending 5/24 UCx: pending   Thank you for allowing pharmacy to be a part of this patient's care.  Napoleon Form 12/31/2015 4:51 PM

## 2015-12-31 NOTE — ED Notes (Signed)
AAOx3.  Skin warm and dry.  NAD 

## 2015-12-31 NOTE — ED Provider Notes (Signed)
[facility] Emergency Department Provider Note   ____________________________________________  Time seen: Approximately 1:03 PM  I have reviewed the triage vital signs and the nursing notes.   HISTORY  Chief Complaint Code Sepsis    HPI David [name] is a 60 y.o. male with history of COPD, lung cancer on chemotherapy, last chemotherapy 1 week ago who presents for evaluation of fever today as well as worsening productive cough and nasal congestion, also since onset, moderate, no modifying factors. He had one episode of nonbloody nonbilious emesis today. No diarrhea, no abdominal pain. He has had intermittent right-sided chest pain.   Past Medical History  Diagnosis Date  . History of melanoma   . History of squamous cell carcinoma   . COPD (chronic obstructive pulmonary disease) ([place])   . Cancer [facility])     Patient Active Problem List   Diagnosis Date Noted  . Small cell lung cancer ([place]) 09/08/2015    Past Surgical History  Procedure Laterality Date  . Skin graft      [facility]  . Peripheral vascular catheterization N/A 09/15/2015    Procedure: Glori Luis Cath Insertion;  Surgeon: Algernon Huxley, MD;  Location: [facility];  Service: Cardiovascular;  Laterality: N/A;    Current Outpatient Rx  Name  Route  Sig  Dispense  Refill  . acetaminophen (TYLENOL) 325 MG tablet   Oral   Take 325 mg by mouth every 6 (six) hours as needed.         Marland Kitchen albuterol (PROVENTIL HFA;VENTOLIN HFA) 108 (90 Base) MCG/ACT inhaler   Inhalation   Inhale 2 puffs into the lungs every 6 (six) hours as needed for wheezing or shortness of breath.   1 Inhaler   2   . lidocaine-prilocaine (EMLA) cream      Apply to affected area once   30 g   3   . ondansetron (ZOFRAN) 8 MG tablet   Oral   Take 1 tablet (8 mg total) by mouth 2 (two) times daily as needed. Start on the third day after cisplatin chemotherapy.   30 tablet   1   . Oxycodone HCl 10 MG TABS   Oral  Take 1 tablet (10 mg total) by mouth every 6 (six) hours as needed.   84 tablet   0   . prochlorperazine (COMPAZINE) 10 MG tablet   Oral   Take 1 tablet (10 mg total) by mouth every 6 (six) hours as needed (Nausea or vomiting).   30 tablet   1   . senna-docusate (SENOKOT-S) 8.6-50 MG tablet   Oral   Take 2 tablets by mouth at bedtime as needed for mild constipation.   20 tablet   0     Allergies Vicodin  No family history on file.  Social History Social History  Substance Use Topics  . Smoking status: Current Every Day Smoker -- 1.00 packs/day for 51 years    Types: Cigarettes    Start date: 08/12/1994  . Smokeless tobacco: Never Used  . Alcohol Use: No    Review of Systems Constitutional: + fever/chills Eyes: No visual changes. ENT: No sore throat. Cardiovascular: + chest pain. Respiratory: Denies shortness of breath. Gastrointestinal: No abdominal pain.  No nausea, + vomiting.  No diarrhea.  No constipation. Genitourinary: Negative for dysuria. Musculoskeletal: Negative for back pain. Skin: Negative for rash. Neurological: Negative for headaches, focal weakness or numbness.  10-point ROS otherwise negative.  ____________________________________________   PHYSICAL EXAM:  VITAL SIGNS: ED Triage Vitals  Enc Vitals Group     BP 12/31/15 1240 115/59 mmHg     Pulse Rate 12/31/15 1240 116     Resp 12/31/15 1240 20     Temp 12/31/15 1240 102.9 F (39.4 C)     Temp Source 12/31/15 1240 Oral     SpO2 12/31/15 1240 97 %     Weight 12/31/15 1240 128 lb (58.06 kg)     Height 12/31/15 1240 '5\' 9"'$  (1.753 m)     Head Cir --      Peak Flow --      Pain Score 12/31/15 1249 9     Pain Loc --      Pain Edu? --      Excl. in [place]? --     Constitutional: Alert and oriented. Nontoxic-appearing and in no acute distress. Eyes: Conjunctivae are normal. PERRL. EOMI. Head: Atraumatic. Nose: No congestion/rhinnorhea. Mouth/Throat: Mucous membranes are moist.  Oropharynx  non-erythematous. Neck: No stridor.  Supple without meningismus. Cardiovascular: Tachycardic rate, regular rhythm. Grossly normal heart sounds.  Good peripheral circulation. Respiratory: Normal respiratory effort.  No retractions. Lungs CTAB. Gastrointestinal: Soft and nontender. No distention. No CVA tenderness. Genitourinary: deferred Musculoskeletal: No lower extremity tenderness nor edema.  No joint effusions. Neurologic:  Normal speech and language. No gross focal neurologic deficits are appreciated.  Skin:  Skin is warm, dry and intact. No rash noted. Psychiatric: Mood and affect are normal. Speech and behavior are normal.  ____________________________________________   LABS (all labs ordered are listed, but only abnormal results are displayed)  Labs Reviewed  COMPREHENSIVE METABOLIC PANEL - Abnormal; Notable for the following:    Glucose, Bld 120 (*)    ALT 12 (*)    All other components within normal limits  BRAIN NATRIURETIC PEPTIDE - Abnormal; Notable for the following:    B Natriuretic Peptide 322.0 (*)    All other components within normal limits  TROPONIN I - Abnormal; Notable for the following:    Troponin I 0.08 (*)    All other components within normal limits  CBC WITH DIFFERENTIAL/PLATELET - Abnormal; Notable for the following:    WBC 15.6 (*)    RBC 3.33 (*)    Hemoglobin 10.3 (*)    HCT 31.0 (*)    RDW 16.4 (*)    Neutro Abs 14.1 (*)    Lymphs Abs 0.5 (*)    All other components within normal limits  APTT - Abnormal; Notable for the following:    aPTT 39 (*)    All other components within normal limits  URINALYSIS COMPLETEWITH MICROSCOPIC ([facility] ONLY) - Abnormal; Notable for the following:    Color, Urine YELLOW (*)    APPearance CLOUDY (*)    Glucose, UA 50 (*)    Hgb urine dipstick 2+ (*)    Squamous Epithelial / LPF 0-5 (*)    All other components within normal limits  RAPID INFLUENZA A&B ANTIGENS ([facility] ONLY)  CULTURE, BLOOD (ROUTINE X 2)    CULTURE, BLOOD (ROUTINE X 2)  URINE CULTURE  LACTIC ACID, PLASMA  PROTIME-INR  LACTIC ACID, PLASMA  TYPE AND SCREEN   ____________________________________________  EKG  ED ECG REPORT I, Joanne Gavel, the attending physician, personally viewed and interpreted this ECG.   Date: 12/31/2015  EKG Time: 14:04  Rate: 97  Rhythm: normal sinus rhythm, normal EKG  Axis: normal  Intervals:none  ST&T Change: No acute ST elevation.  ____________________________________________  RADIOLOGY  CXR IMPRESSION:  1. No active cardiopulmonary disease. 2. Previously demonstrated nodule has significantly regressed in size with a small area of spiculation measuring approximately 5 mm at the site of the previously demonstrated right upper lobe nodule.  ____________________________________________   PROCEDURES  Procedure(s) performed: None  Critical Care performed: Yes, see critical care note(s). Total critical care time spent 35.   ____________________________________________   INITIAL IMPRESSION / ASSESSMENT AND PLAN / ED COURSE  Pertinent labs & imaging results that were available during my care of the patient were reviewed by me and considered in my medical decision making (see chart for details).  David [name] is a 60 y.o. male with history of COPD, lung cancer on chemotherapy, last chemotherapy 1 week ago who presents for evaluation of fever today as well as worsening productive cough and nasal congestion. On arrival to the ER, he is nontoxic appearing and in no acute distress. His vital signs are notable for fever with a maximum temperature 102.9, he is tachycardic and white blood cell count is elevated at 15.6. He meets 3 out of 4 septic criteria. CODE sepsis was initiated, he received liberal IV fluids, IV vancomycin and Zosyn. Chest x-ray shows no pneumonia, urinalysis consistent with infection. His heart rate is improving with effervescence as well as the above treatment.  Troponin was mildly elevated 0.08, question NSTEMI versus demand ischemia. We'll give aspirin. Case discussed with the hospitalist, Dr. Dede Query, for admission at 3 PM. ____________________________________________   FINAL CLINICAL IMPRESSION(S) / ED DIAGNOSES  Final diagnoses:  Sepsis, due to unspecified organism [facility])      NEW MEDICATIONS STARTED DURING THIS VISIT:  New Prescriptions   No medications on file     Note:  This document was prepared using Dragon voice recognition software and may include unintentional dictation errors.    Joanne Gavel, MD 12/31/15 [phone]

## 2015-12-31 NOTE — ED Notes (Signed)
Called code sepsis to doug in carelink [phone]

## 2016-01-01 ENCOUNTER — Inpatient Hospital Stay: Payer: Medicaid Other

## 2016-01-01 DIAGNOSIS — R51 Headache: Secondary | ICD-10-CM

## 2016-01-01 DIAGNOSIS — A419 Sepsis, unspecified organism: Principal | ICD-10-CM

## 2016-01-01 DIAGNOSIS — J449 Chronic obstructive pulmonary disease, unspecified: Secondary | ICD-10-CM

## 2016-01-01 DIAGNOSIS — C3491 Malignant neoplasm of unspecified part of right bronchus or lung: Secondary | ICD-10-CM

## 2016-01-01 DIAGNOSIS — D649 Anemia, unspecified: Secondary | ICD-10-CM

## 2016-01-01 DIAGNOSIS — F17210 Nicotine dependence, cigarettes, uncomplicated: Secondary | ICD-10-CM

## 2016-01-01 DIAGNOSIS — Z79899 Other long term (current) drug therapy: Secondary | ICD-10-CM

## 2016-01-01 LAB — BASIC METABOLIC PANEL
ANION GAP: 5 (ref 5–15)
BUN: 10 mg/dL (ref 6–20)
CHLORIDE: 108 mmol/L (ref 101–111)
CO2: 25 mmol/L (ref 22–32)
Calcium: 8.2 mg/dL — ABNORMAL LOW (ref 8.9–10.3)
Creatinine, Ser: 0.84 mg/dL (ref 0.61–1.24)
GFR calc Af Amer: >60 mL/min (ref >60)
GLUCOSE: 89 mg/dL (ref 65–99)
POTASSIUM: 3.7 mmol/L (ref 3.5–5.1)
SODIUM: 138 mmol/L (ref 135–145)

## 2016-01-01 LAB — CBC
HEMATOCRIT: 25.8 % — AB (ref 40.0–52.0)
HEMOGLOBIN: 8.6 g/dL — AB (ref 13.0–18.0)
MCH: 31.6 pg (ref 26.0–34.0)
MCHC: 33.5 g/dL (ref 32.0–36.0)
MCV: 94.2 fL (ref 80.0–100.0)
Platelets: 130 10*3/uL — ABNORMAL LOW (ref 150–440)
RBC: 2.74 MIL/uL — ABNORMAL LOW (ref 4.40–5.90)
RDW: 16.2 % — ABNORMAL HIGH (ref 11.5–14.5)
WBC: 10.3 10*3/uL (ref 3.8–10.6)

## 2016-01-01 LAB — TROPONIN I: Troponin I: <0.03 ng/mL (ref <0.031)

## 2016-01-01 MED ORDER — IPRATROPIUM-ALBUTEROL 0.5-2.5 (3) MG/3ML IN SOLN: 3.0000 mL | RESPIRATORY_TRACT | Status: DC | PRN

## 2016-01-01 MED ORDER — GADOBENATE DIMEGLUMINE 529 MG/ML IV SOLN
10.0000 mL | Freq: Once | INTRAVENOUS | Status: AC | PRN
  Administered 2016-01-01: 11:00:00 10 mL via INTRAVENOUS

## 2016-01-01 MED ORDER — DIPHENHYDRAMINE HCL 25 MG PO CAPS: 25.0000 mg | ORAL_CAPSULE | Freq: Every evening | ORAL | Status: DC | PRN

## 2016-01-01 MED ORDER — ENSURE ENLIVE PO LIQD
237.0000 mL | Freq: Two times a day (BID) | ORAL | Status: DC
  Administered 2016-01-01 – 2016-01-02 (×2): 237 mL via ORAL

## 2016-01-01 NOTE — Progress Notes (Signed)
[place] at [place] NAME: Eugine Bubb    MR#:  [id number]  DATE OF BIRTH:  1956-04-03  SUBJECTIVE: Admitted yesterday because of sepsis. Patient right now has no fever or shortness of breath. Blood pressure is low but overall he feels much better than yesterday and wants to take shower.No cough   CHIEF COMPLAINT:   Chief Complaint  Patient presents with  . Code Sepsis    REVIEW OF SYSTEMS:   ROS CONSTITUTIONAL: No fever, fatigue or weakness.  EYES: No blurred or double vision.  EARS, NOSE, AND THROAT: No tinnitus or ear pain.  RESPIRATORY: No cough, shortness of breath, wheezing or hemoptysis.  CARDIOVASCULAR: No chest pain, orthopnea, edema.  GASTROINTESTINAL: No nausea, vomiting, diarrhea or abdominal pain.  GENITOURINARY: No dysuria, hematuria.  ENDOCRINE: No polyuria, nocturia,  HEMATOLOGY: No anemia, easy bruising or bleeding SKIN: No rash or lesion. MUSCULOSKELETAL: No joint pain or arthritis.   NEUROLOGIC: No tingling, numbness, weakness.  PSYCHIATRY: No anxiety or depression.   DRUG ALLERGIES:   Allergies  Allergen Reactions  . Vicodin [Hydrocodone-Acetaminophen] Nausea Only    VITALS:  Blood pressure 96/63, pulse 81, temperature 98.2 F (36.8 C), temperature source Oral, resp. rate 18, height '5\' 9"'$  (1.753 m), weight 58.06 kg (128 lb), SpO2 99 %.  PHYSICAL EXAMINATION:  GENERAL:  60 y.o.-year-old patient lying in the bed with no acute distress.  EYES: Pupils equal, round, reactive to light and accommodation. No scleral icterus. Extraocular muscles intact.  HEENT: Head atraumatic, normocephalic. Oropharynx and nasopharynx clear.  NECK:  Supple, no jugular venous distention. No thyroid enlargement, no tenderness.  LUNGS: Normal breath sounds bilaterally, no wheezing, rales,rhonchi or crepitation. No use of accessory muscles of respiration.  CARDIOVASCULAR: S1, S2 normal. No murmurs, rubs, or gallops.  ABDOMEN:  Soft, nontender, nondistended. Bowel sounds present. No organomegaly or mass.  EXTREMITIES: No pedal edema, cyanosis, or clubbing.  NEUROLOGIC: Cranial nerves II through XII are intact. Muscle strength 5/5 in all extremities. Sensation intact. Gait not checked.  PSYCHIATRIC: The patient is alert and oriented x 3.  SKIN: No obvious rash, lesion, or ulcer.    LABORATORY PANEL:   CBC  Recent Labs Lab 01/01/16 0600  WBC 10.3  HGB 8.6*  HCT 25.8*  PLT 130*   ------------------------------------------------------------------------------------------------------------------  Chemistries   Recent Labs Lab 12/31/15 1233 01/01/16 0600  NA 135 138  K 3.9 3.7  CL 102 108  CO2 25 25  GLUCOSE 120* 89  BUN 14 10  CREATININE 0.97 0.84  CALCIUM 9.0 8.2*  AST 18  --   ALT 12*  --   ALKPHOS 111  --   BILITOT 0.3  --    ------------------------------------------------------------------------------------------------------------------  Cardiac Enzymes  Recent Labs Lab 01/01/16 0600  TROPONINI <0.03   ------------------------------------------------------------------------------------------------------------------  RADIOLOGY:  Dg Chest 2 View  12/31/2015  CLINICAL DATA:  Cough, congestion, fever EXAM: CHEST  2 VIEW COMPARISON:  10/01/2015 FINDINGS: The lungs are hyperinflated likely secondary to COPD. There is no focal parenchymal opacity. There is no pneumothorax or pleural effusion. Previously demonstrated nodule has significantly regressed in size with a small area of spiculation measuring approximately 5 mm at the site of the previously demonstrated right upper lobe nodule. The heart and mediastinal contours are unremarkable. Right-sided Port-A-Cath in satisfactory position. The osseous structures are unremarkable. IMPRESSION: 1. No active cardiopulmonary disease. 2. Previously demonstrated nodule has significantly regressed in size with a small area of spiculation measuring  approximately  5 mm at the site of the previously demonstrated right upper lobe nodule. Electronically Signed   By: Kathreen Devoid   On: 12/31/2015 14:08    EKG:   Orders placed or performed during the hospital encounter of 12/31/15  . ED EKG 12-Lead  . ED EKG 12-Lead  . EKG 12-Lead  . EKG 12-Lead    ASSESSMENT AND PLAN:   1 sepsis likely secondary to acute bronchitis: Clinically improving, continue IV hydration due to's low blood pressure, continue IV antibiotics. Likely discharge tomorrow morning with by mouth Levaquin. Influenza test is negative.. #2 history of COPD with ongoing tobacco abuse; counseled against smoking #3 right lung small cell cancer on chemotherapy at this time. For leukocytosis improved today.    All the records are reviewed and case discussed with Care Management/Social Workerr. Management plans discussed with the patient, family and they are in agreement.  CODE STATUS: full  TOTAL TIME TAKING CARE OF THIS PATIENT: 18mnutes.   POSSIBLE D/C IN 1-2 DAYS, DEPENDING ON CLINICAL CONDITION.   KEpifanio LeschesM.D on 01/01/2016 at 9:24 AM  Between 7am to 6pm - Pager - [phone]  After 6pm go to [email or web address] - password [email or web address]  Office  [phone] CC: Primary care physician; TLloyd Huger MD   Note: This dictation was prepared with Dragon dictation along with smaller phrase technology. Any transcriptional errors that result from this process are unintentional.

## 2016-01-01 NOTE — Progress Notes (Signed)
Initial Nutrition Assessment  DOCUMENTATION CODES:   Not applicable  INTERVENTION:   Cater to pt preferences on Regular diet order, continues snacks between meals to meet nutritional needs Recommend Ensure Enlive po BID, each supplement provides 350 kcal and 20 grams of protein. RD also discussed having supplement drinks at home for future as well to maintain weight and nutritional status as pt reports he has been wanting to try them.   NUTRITION DIAGNOSIS:   Inadequate oral intake related to cancer and cancer related treatments as evidenced by per patient/family report.  GOAL:   Patient will meet greater than or equal to 90% of their needs  MONITOR:   PO intake, Supplement acceptance, Labs, I & O's, Weight trends  REASON FOR ASSESSMENT:   Malnutrition Screening Tool    ASSESSMENT:   Pt admitted with SOB and sepsis. Pt with h/o small cell lung cancer having had his last chemotherapy treatment 3 days PTA.   Past Medical History  Diagnosis Date  . History of melanoma   . COPD (chronic obstructive pulmonary disease) (HCC)     not on home o2  . Small cell lung cancer ([place])   . Tobacco use     Diet Order:  Diet regular Room service appropriate?: Yes; Fluid consistency:: Thin   Pt reports eating a good lunch today and that he has been 'eating like a horse' now that his fevers are gone. Recorded po intake of 100% of meals since admission. Pt reports he did not want to eat when his fevers started a day or so before admission, but that usually he has been having a good appetite, eating 3 meals per day with snacks between. Pt had candy at bedside he was eating a snack on visit. Pt reports no difficulty swallowing.  Medications: Colace, NS at 65m/hr Labs: reviewed, Ca 8.2   Gastrointestinal Profile: Last BM:  12/31/2015   Nutrition-Focused Physical Exam Findings:  Unable to complete Nutrition-Focused physical exam at this time as pt reports he is being discharged and  dressed with shoes on to leave.    Weight Change: [place] and then he gained to 134lbs. Pt current weight in [facility] of 128lbs. (5.8% weight loss in 2 months per weight trends)   Skin:  Reviewed, no issues  Last BM:  12/31/2015  Height:   Ht Readings from Last 1 Encounters:  12/31/15 '5\' 9"'$  (1.753 m)    Weight:   Wt Readings from Last 1 Encounters:  12/31/15 128 lb (58.06 kg)   Wt Readings from Last 10 Encounters:  12/31/15 128 lb (58.06 kg)  12/23/15 129 lb 6.6 oz (58.7 kg)  12/11/15 133 lb 2.5 oz (60.4 kg)  12/08/15 135 lb 5.8 oz (61.4 kg)  12/01/15 132 lb (59.875 kg)  11/23/15 135 lb (61.236 kg)  11/17/15 134 lb 0.6 oz (60.8 kg)  11/10/15 134 lb 7.7 oz (61 kg)  10/20/15 135 lb 5.8 oz (61.4 kg)  10/13/15 136 lb 7.4 oz (61.9 kg)    BMI:  Body mass index is 18.89 kg/(m^2).  Estimated Nutritional Needs:   Kcal:  1740-2030kcals  Protein:  70-87g protein  Fluid:  >/= 1.8L fluid  EDUCATION NEEDS:   No education needs identified at this time  ADwyane Luo RD, LDN Pager [phone]/On-Call Pager ([phone]

## 2016-01-01 NOTE — Care Management (Signed)
Admitted to [facility] with the diagnosis of sepsis. Lives alone. Friend is [name] [phone]). Daughter is Ewan Grau [phone]). Last seen Dr, Grayland Ormond last Thursday. Last chemotherapy Thursday of last week. [facility] [place]. States he can't afford an inhaler. Medication Management application given. No home health. No skilled facility. Takes care of all basic and instrumental activities of daily living himself, drives. Uses no aids for ambulation. Friend will transport. Shelbie Ammons RN MSN CCM Care Management [phone]

## 2016-01-01 NOTE — Consult Note (Signed)
[place]  Telephone:([phone]) [phone] [phone]) [phone]  ID: ATARI NOVICK OB: [birth year]  MR#: [id number]  BOF#:[id number]  Patient Care Team: Lloyd Huger, MD as PCP - General (Oncology)  CHIEF COMPLAINT:  Chief Complaint  Patient presents with  . Code Sepsis    INTERVAL HISTORY: 60 year old male who recently completed his fourth and final cycle of chemotherapy using carboplatinum and etoposide approximately one week ago. He was admitted overnight with increasing cough and congestion as well as fever. Patient continues to have pain, but this is unchanged. He feels significantly improved from admission and feels near his baseline.  He continues to have a headache, but MRI the brain is negative. He has no other neurologic complaints. He denies any fevers. He denies any chest pain or hemoptysis. He denies any nausea, vomiting, constipation, or diarrhea. He has no urinary complaints. Patient offers no further specific complaints.  REVIEW OF SYSTEMS:   Review of Systems  Constitutional: Positive for malaise/fatigue. Negative for fever and weight loss.  Respiratory: Positive for cough and shortness of breath.   Cardiovascular: Negative.  Negative for chest pain.  Gastrointestinal: Negative.   Genitourinary: Negative.   Musculoskeletal: Positive for back pain.  Neurological: Positive for weakness.  Psychiatric/Behavioral: Negative.     As per HPI. Otherwise, a complete review of systems is negatve.  PAST MEDICAL HISTORY: Past Medical History  Diagnosis Date  . History of melanoma   . COPD (chronic obstructive pulmonary disease) (HCC)     not on home o2  . Small cell lung cancer ([place])   . Tobacco use     PAST SURGICAL HISTORY: Past Surgical History  Procedure Laterality Date  . Skin graft      [facility]  . Peripheral vascular catheterization N/A 09/15/2015    Procedure: Glori Luis Cath Insertion;  Surgeon: Algernon Huxley, MD;  Location: [place] [facility];   Service: Cardiovascular;  Laterality: N/A;    FAMILY HISTORY Family History  Problem Relation Age of Onset  . Lung cancer Mother   . Cirrhosis Father        ADVANCED DIRECTIVES:    HEALTH MAINTENANCE: Social History  Substance Use Topics  . Smoking status: Current Every Day Smoker -- 1.00 packs/day for 51 years    Types: Cigarettes    Start date: 08/12/1994  . Smokeless tobacco: Never Used     Comment: used to smoke upto 2PPD, recently cut down  . Alcohol Use: No     Colonoscopy:  PAP:  Bone density:  Lipid panel:  Allergies  Allergen Reactions  . Vicodin [Hydrocodone-Acetaminophen] Nausea Only    Current Facility-Administered Medications  Medication Dose Route Frequency Provider Last Rate Last Dose  . 0.9 %  sodium chloride infusion   Intravenous Continuous Gladstone Lighter, MD 75 mL/hr at 01/01/16 0920    . acetaminophen (TYLENOL) tablet 650 mg  650 mg Oral Q6H PRN Gladstone Lighter, MD       Or  . acetaminophen (TYLENOL) suppository 650 mg  650 mg Rectal Q6H PRN Gladstone Lighter, MD      . benzonatate (TESSALON) capsule 100 mg  100 mg Oral TID Gladstone Lighter, MD   100 mg at 01/01/16 0911  . docusate sodium (COLACE) capsule 100 mg  100 mg Oral BID Gladstone Lighter, MD   100 mg at 12/31/15 2124  . enoxaparin (LOVENOX) injection 40 mg  40 mg Subcutaneous Q24H Gladstone Lighter, MD   40 mg at 12/31/15 1821  . feeding supplement (ENSURE  ENLIVE) (ENSURE ENLIVE) liquid 237 mL  237 mL Oral BID BM Epifanio Lesches, MD   237 mL at 01/01/16 1400  . ipratropium-albuterol (DUONEB) 0.5-2.5 (3) MG/3ML nebulizer solution 3 mL  3 mL Nebulization Q4H PRN Epifanio Lesches, MD      . levofloxacin (LEVAQUIN) IVPB 750 mg  750 mg Intravenous Q24H Gladstone Lighter, MD   750 mg at 12/31/15 1821  . mometasone-formoterol (DULERA) 200-5 MCG/ACT inhaler 2 puff  2 puff Inhalation BID Gladstone Lighter, MD   2 puff at 01/01/16 0911  . morphine 2 MG/ML injection 2 mg  2 mg Intravenous  Q4H PRN Gladstone Lighter, MD      . nicotine (NICODERM CQ - dosed in mg/24 hours) patch 21 mg  21 mg Transdermal Daily Gladstone Lighter, MD   21 mg at 12/31/15 1734  . ondansetron (ZOFRAN) tablet 4 mg  4 mg Oral Q6H PRN Gladstone Lighter, MD       Or  . ondansetron (ZOFRAN) injection 4 mg  4 mg Intravenous Q6H PRN Gladstone Lighter, MD      . oxyCODONE (Oxy IR/ROXICODONE) immediate release tablet 10 mg  10 mg Oral Q6H PRN Gladstone Lighter, MD      . polyethylene glycol (MIRALAX / GLYCOLAX) packet 17 g  17 g Oral Daily PRN Gladstone Lighter, MD       Facility-Administered Medications Ordered in Other Encounters  Medication Dose Route Frequency Provider Last Rate Last Dose  . 0.9 %  sodium chloride infusion   Intravenous Once Lloyd Huger, MD        OBJECTIVE: Filed Vitals:   01/01/16 0514 01/01/16 1329  BP: 96/63 96/55  Pulse: 81 80  Temp: 98.2 F (36.8 C) 97.8 F (36.6 C)  Resp: 18 20     Body mass index is 18.89 kg/(m^2).    ECOG FS:0 - Asymptomatic  General: Well-developed, well-nourished, no acute distress. Eyes: Pink conjunctiva, anicteric sclera. HEENT: Normocephalic, moist mucous membranes, clear oropharnyx. Lungs: Clear to auscultation bilaterally. Heart: Regular rate and rhythm. No rubs, murmurs, or gallops. Abdomen: Soft, nontender, nondistended. No organomegaly noted, normoactive bowel sounds. Musculoskeletal: No edema, cyanosis, or clubbing. Neuro: Alert, answering all questions appropriately. Cranial nerves grossly intact. Skin: No rashes or petechiae noted. Psych: Normal affect. Lymphatics: No cervical, calvicular, axillary or inguinal LAD.   LAB RESULTS:  Lab Results  Component Value Date   NA 138 01/01/2016   K 3.7 01/01/2016   CL 108 01/01/2016   CO2 25 01/01/2016   GLUCOSE 89 01/01/2016   BUN 10 01/01/2016   CREATININE 0.84 01/01/2016   CALCIUM 8.2* 01/01/2016   PROT 7.2 12/31/2015   ALBUMIN 3.9 12/31/2015   AST 18 12/31/2015   ALT 12*  12/31/2015   ALKPHOS 111 12/31/2015   BILITOT 0.3 12/31/2015   GFRNONAA >60 01/01/2016   GFRAA >60 01/01/2016    Lab Results  Component Value Date   WBC 10.3 01/01/2016   NEUTROABS 14.1* 12/31/2015   HGB 8.6* 01/01/2016   HCT 25.8* 01/01/2016   MCV 94.2 01/01/2016   PLT 130* 01/01/2016     STUDIES: Dg Chest 2 View  12/31/2015  CLINICAL DATA:  Cough, congestion, fever EXAM: CHEST  2 VIEW COMPARISON:  10/01/2015 FINDINGS: The lungs are hyperinflated likely secondary to COPD. There is no focal parenchymal opacity. There is no pneumothorax or pleural effusion. Previously demonstrated nodule has significantly regressed in size with a small area of spiculation measuring approximately 5 mm at the site of the previously  demonstrated right upper lobe nodule. The heart and mediastinal contours are unremarkable. Right-sided Port-A-Cath in satisfactory position. The osseous structures are unremarkable. IMPRESSION: 1. No active cardiopulmonary disease. 2. Previously demonstrated nodule has significantly regressed in size with a small area of spiculation measuring approximately 5 mm at the site of the previously demonstrated right upper lobe nodule. Electronically Signed   By: Kathreen Devoid   On: 12/31/2015 14:08   Mr Brain W Wo Contrast  01/01/2016  CLINICAL DATA:  Small cell lung cancer. Headache. On chemotherapy. Sepsis. EXAM: MRI HEAD WITHOUT AND WITH CONTRAST TECHNIQUE: Multiplanar, multiecho pulse sequences of the brain and surrounding structures were obtained without and with intravenous contrast. CONTRAST:  89m MULTIHANCE GADOBENATE DIMEGLUMINE 529 MG/ML IV SOLN COMPARISON:  CT head 12/05/2005 FINDINGS: Ventricle size is normal.  Cerebral volume normal for age. Negative for acute infarct.  No significant chronic ischemia. Negative for intracranial hemorrhage or fluid collection. Negative for mass or edema. Postcontrast imaging degraded by motion. No enhancing mass lesion identified. Negative for  metastatic disease. Negative calvarium. Extensive mucosal edema in the paranasal sinuses diffusely. Right mastoid sinus effusion. Small left mastoid effusion. IMPRESSION: Negative for metastatic disease.  No acute intracranial abnormality Sinusitis Electronically Signed   By: CFranchot GalloM.D.   On: 01/01/2016 11:02    ASSESSMENT: Small cell lung cancer, sepsis syndrome.  PLAN:    1. Small cell lung cancer: Patient completed cycle 4 of carboplatin and etoposide approximately one week ago. He previously was receiving cisplatin, but this was discontinued secondary to tinnitus. No further intervention is needed at this time. Patient has been instructed to keep his previously scheduled PET scan and follow-up in approximately 6 weeks. 2. Headache: Repeat MRI reviewed independently and reported as above with no metastatic disease. 3. Sepsis syndrome: Improved. Patient denies fevers or shortness of breath. Continue current antibiotics. 4. Pain: Continue current narcotic regimen. Monitor closely, there is suspicion of drug-seeking behavior. 5. Neutropenia: Resolved. Patient received Neulasta with his chemotherapy last week. 6. Anemia: Secondary chemotherapy, monitor. 7. Disposition: Okay to discharge from an oncology standpoint with follow-up as above.   Appreciate consult, call with questions.  Small cell lung cancer ([facility]   Staging form: Lung, AJCC 7th Edition     Clinical stage from 09/08/2015: Stage IIA (T1b, N1, M0) - Signed by TLloyd Huger MD on 09/08/2015   TLloyd Huger MD   01/01/2016 4:10 PM

## 2016-01-02 LAB — URINE CULTURE: Culture: NO GROWTH

## 2016-01-02 LAB — CBC
HEMATOCRIT: 25.8 % — AB (ref 40.0–52.0)
HEMOGLOBIN: 8.7 g/dL — AB (ref 13.0–18.0)
MCH: 31.6 pg (ref 26.0–34.0)
MCHC: 33.8 g/dL (ref 32.0–36.0)
MCV: 93.3 fL (ref 80.0–100.0)
Platelets: 128 10*3/uL — ABNORMAL LOW (ref 150–440)
RBC: 2.77 MIL/uL — AB (ref 4.40–5.90)
RDW: 16.4 % — ABNORMAL HIGH (ref 11.5–14.5)
WBC: 10.3 10*3/uL (ref 3.8–10.6)

## 2016-01-02 MED ORDER — LEVOFLOXACIN 500 MG PO TABS: 500.0000 mg | ORAL_TABLET | Freq: Every day | ORAL | Status: DC

## 2016-01-02 NOTE — Care Management (Signed)
Discharge to home today per Dr. Dian [name]. List of primary care providers accepting new patients given to David [name] per his request. Friend will transport. Will follow-up with Dr. Grayland [name] at the [facility]. David Ammons RN MSN CCM Care Management [phone]

## 2016-01-02 NOTE — Discharge Summary (Signed)
David [name], is a 60 y.o. male  DOB October 06, 1955  MRN [id number].  Admission date:  12/31/2015  Admitting Physician  Gladstone Lighter, MD  Discharge Date:  01/02/2016   Primary MD  Lloyd Huger, MD  Recommendations for primary care physician for things to follow:   Follow-up with Dr. Grayland Ormond in 2-3 weeks   Admission Diagnosis  Lung cancer ([place]) [C34.90] Headache [R51] Sepsis, due to unspecified organism [facility]) [A41.9]   Discharge Diagnosis  Lung cancer ([place]) [C34.90] Headache [R51] Sepsis, due to unspecified organism [facility]) [A41.9]    Active Problems:   Sepsis [facility])      Past Medical History  Diagnosis Date  . History of melanoma   . COPD (chronic obstructive pulmonary disease) (HCC)     not on home o2  . Small cell lung cancer ([place])   . Tobacco use     Past Surgical History  Procedure Laterality Date  . Skin graft      [facility]  . Peripheral vascular catheterization N/A 09/15/2015    Procedure: Glori Luis Cath Insertion;  Surgeon: Algernon Huxley, MD;  Location: [place] [facility];  Service: Cardiovascular;  Laterality: N/A;       History of present illness and  Hospital Course:     Kindly see H&P for history of present illness and admission details, please review complete Labs, Consult reports and Test reports for all details in brief  HPI  from the history and physical done on the day of admission  60 year old male patient with history of COPD with ongoing smoking has history of right lung small cell cancer currently on chemotherapy came in because of fever and chills. Admitted for course sepsis.   Hospital Course  #1 code  sepsis: With evidence of fever and white count and tachycardia and admission. Influenza test is negative chest x-ray clear without any infiltrate. Patient blood cultures are negative  to date. Started on Levaquin, IV fluids. Patient is treated for acute bronchitis. Blood cultures and urine cultures are negative. No hypoxia. Patient is much better today. Discharge home with [facility]. Patient is seen by Dr. Grayland Ormond. CBC decreased from 15.6-10.3. #2 symptoms of sepsis improved with decreased white count, improved blood pressure, decreased heart rate. Patient has been afebrile. #3 history of small cell lung cancer. Seen by Dr. Grayland Ormond, follow-up with him as an outpatient.   Discharge Condition:Stable Follow UP  Follow-up Information    Follow up with Lloyd Huger, MD In 1 week.   Specialty:  Oncology   Why:  at    Contact information:   [place] [address] [phone]         Discharge Instructions  and  Discharge Medications        Medication List    STOP taking these medications        ibuprofen 200 MG tablet  Commonly known as:  ADVIL,MOTRIN      TAKE these medications        albuterol 108 (90 Base) MCG/ACT inhaler  Commonly known as:  PROVENTIL HFA;VENTOLIN HFA  Inhale 2 puffs into the lungs every 6 (six) hours as needed for wheezing or shortness of breath.     levofloxacin 500 MG tablet  Commonly known as:  LEVAQUIN  Take 1 tablet (500 mg total) by mouth daily.     ondansetron 8 MG tablet  Commonly known as:  ZOFRAN  Take 1 tablet (8 mg total) by mouth 2 (  two) times daily as needed. Start on the third day after cisplatin chemotherapy.     Oxycodone HCl 10 MG Tabs  Take 1 tablet (10 mg total) by mouth every 6 (six) hours as needed.     prochlorperazine 10 MG tablet  Commonly known as:  COMPAZINE  Take 1 tablet (10 mg total) by mouth every 6 (six) hours as needed (Nausea or vomiting).          Diet and Activity recommendation: See Discharge Instructions above   Consults obtained;p Oncology   Major procedures and Radiology Reports - PLEASE review detailed and final reports for all details, in  brief -     Dg Chest 2 View  12/31/2015  CLINICAL DATA:  Cough, congestion, fever EXAM: CHEST  2 VIEW COMPARISON:  10/01/2015 FINDINGS: The lungs are hyperinflated likely secondary to COPD. There is no focal parenchymal opacity. There is no pneumothorax or pleural effusion. Previously demonstrated nodule has significantly regressed in size with a small area of spiculation measuring approximately 5 mm at the site of the previously demonstrated right upper lobe nodule. The heart and mediastinal contours are unremarkable. Right-sided Port-A-Cath in satisfactory position. The osseous structures are unremarkable. IMPRESSION: 1. No active cardiopulmonary disease. 2. Previously demonstrated nodule has significantly regressed in size with a small area of spiculation measuring approximately 5 mm at the site of the previously demonstrated right upper lobe nodule. Electronically Signed   By: Kathreen Devoid   On: 12/31/2015 14:08   Mr Brain W Wo Contrast  01/01/2016  CLINICAL DATA:  Small cell lung cancer. Headache. On chemotherapy. Sepsis. EXAM: MRI HEAD WITHOUT AND WITH CONTRAST TECHNIQUE: Multiplanar, multiecho pulse sequences of the brain and surrounding structures were obtained without and with intravenous contrast. CONTRAST:  52m MULTIHANCE GADOBENATE DIMEGLUMINE 529 MG/ML IV SOLN COMPARISON:  CT head 12/05/2005 FINDINGS: Ventricle size is normal.  Cerebral volume normal for age. Negative for acute infarct.  No significant chronic ischemia. Negative for intracranial hemorrhage or fluid collection. Negative for mass or edema. Postcontrast imaging degraded by motion. No enhancing mass lesion identified. Negative for metastatic disease. Negative calvarium. Extensive mucosal edema in the paranasal sinuses diffusely. Right mastoid sinus effusion. Small left mastoid effusion. IMPRESSION: Negative for metastatic disease.  No acute intracranial abnormality Sinusitis Electronically Signed   By: CFranchot GalloM.D.   On:  01/01/2016 11:02    Micro Results    Recent Results (from the past 240 hour(s))  Blood Culture (routine x 2)     Status: None (Preliminary result)   Collection Time: 12/31/15 12:33 PM  Result Value Ref Range Status   Specimen Description BLOOD LEFT AC  Final   Special Requests BOTTLES DRAWN AEROBIC AND ANAEROBIC 6ML  Final   Culture NO GROWTH < 24 HOURS  Final   Report Status PENDING  Incomplete  Blood Culture (routine x 2)     Status: None (Preliminary result)   Collection Time: 12/31/15  1:56 PM  Result Value Ref Range Status   Specimen Description BLOOD LSC  Final   Special Requests BOTTLES DRAWN AEROBIC AND ANAEROBIC 6CC  Final   Culture NO GROWTH < 24 HOURS  Final   Report Status PENDING  Incomplete  Urine culture     Status: None   Collection Time: 12/31/15  1:56 PM  Result Value Ref Range Status   Specimen Description URINE, RANDOM  Final   Special Requests NONE  Final   Culture [facility] GROWTH [facility]  Final   Report Status 01/02/2016 FINAL  Final  Rapid Influenza A&B Antigens ([facility] only)     Status: None   Collection Time: 12/31/15  1:56 PM  Result Value Ref Range Status   Influenza A ([facility]) NEGATIVE NEGATIVE Final   Influenza B ([facility]) NEGATIVE NEGATIVE Final       Today   Subjective:   David [name] today has no headache,no chest abdominal pain,no new weakness tingling or numbness, feels much better wants to go home today.   Objective:   Blood pressure 113/58, pulse 77, temperature 98.7 F (37.1 C), temperature source Oral, resp. rate 18, height '5\' 9"'$  (1.753 m), weight 58.06 kg (128 lb), SpO2 97 %.   Intake/Output Summary (Last 24 hours) at 01/02/16 0959 Last data filed at 01/02/16 0900  Gross per 24 hour  Intake    630 ml  Output      0 ml  Net    630 ml    Exam Awake Alert, Oriented x 3, No new F.N deficits, Normal affect [place].AT,PERRAL Supple Neck,No JVD, No cervical lymphadenopathy appriciated.  Symmetrical Chest wall  movement, Good air movement bilaterally, CTAB RRR,No Gallops,Rubs or new Murmurs, No Parasternal Heave +ve B.Sounds, Abd Soft, Non tender, No organomegaly appriciated, No rebound -guarding or rigidity. No Cyanosis, Clubbing or edema, No new Rash or bruise  Data Review   CBC w Diff: Lab Results  Component Value Date   WBC 10.3 01/02/2016   WBC 4.0 05/23/2014   HGB 8.7* 01/02/2016   HGB 14.3 05/23/2014   HCT 25.8* 01/02/2016   HCT 43.6 05/23/2014   PLT 128* 01/02/2016   PLT 213 05/23/2014   LYMPHOPCT 3% 12/31/2015   MONOPCT 6% 12/31/2015   EOSPCT 1% 12/31/2015   BASOPCT 0% 12/31/2015    CMP: Lab Results  Component Value Date   NA 138 01/01/2016   NA 142 05/23/2014   K 3.7 01/01/2016   K 3.9 05/23/2014   CL 108 01/01/2016   CL 106 05/23/2014   CO2 25 01/01/2016   CO2 30 05/23/2014   BUN 10 01/01/2016   BUN 12 05/23/2014   CREATININE 0.84 01/01/2016   CREATININE 1.26 05/23/2014   PROT 7.2 12/31/2015   PROT 7.2 05/23/2014   ALBUMIN 3.9 12/31/2015   ALBUMIN 3.7 05/23/2014   BILITOT 0.3 12/31/2015   BILITOT 0.4 05/23/2014   ALKPHOS 111 12/31/2015   ALKPHOS 74 05/23/2014   AST 18 12/31/2015   AST 14* 05/23/2014   ALT 12* 12/31/2015   ALT 14 05/23/2014  .   Total Time in preparing paper work, data evaluation and todays exam - 2 minutes  Rikki Smestad M.D on 01/02/2016 at 9:59 AM    Note: This dictation was prepared with Dragon dictation along with smaller phrase technology. Any transcriptional errors that result from this process are unintentional.

## 2016-01-02 NOTE — Progress Notes (Signed)
MD making rounds. Discharge orders received. IV discontinued. Port deaccessed. Prescription E-Scribed to pharmacy. Provided with Education Handouts. Discharge paperwork provided, explained, signed and witnessed. No unanswered questions. Discharged via wheelchair by Nursing Staff. Belongings sent with patient and family.

## 2016-01-05 LAB — CULTURE, BLOOD (ROUTINE X 2)
CULTURE: NO GROWTH
CULTURE: NO GROWTH

## 2016-01-14 ENCOUNTER — Ambulatory Visit: Admission: RE | Admit: 2016-01-14 | Payer: Medicaid Other | Source: Ambulatory Visit

## 2016-01-16 ENCOUNTER — Emergency Department: Payer: Medicaid Other

## 2016-01-16 ENCOUNTER — Encounter: Payer: Self-pay | Admitting: *Deleted

## 2016-01-16 ENCOUNTER — Emergency Department
Admission: EM | Admit: 2016-01-16 | Discharge: 2016-01-17 | Disposition: A | Discharge status: Home / Self Care | Payer: Medicaid Other | Attending: Emergency Medicine | Admitting: Emergency Medicine

## 2016-01-16 DIAGNOSIS — W14XXXA Fall from tree, initial encounter: Secondary | ICD-10-CM | POA: Insufficient documentation

## 2016-01-16 DIAGNOSIS — J449 Chronic obstructive pulmonary disease, unspecified: Secondary | ICD-10-CM | POA: Diagnosis not present

## 2016-01-16 DIAGNOSIS — F17210 Nicotine dependence, cigarettes, uncomplicated: Secondary | ICD-10-CM | POA: Diagnosis not present

## 2016-01-16 DIAGNOSIS — Y929 Unspecified place or not applicable: Secondary | ICD-10-CM | POA: Diagnosis not present

## 2016-01-16 DIAGNOSIS — S300XXA Contusion of lower back and pelvis, initial encounter: Secondary | ICD-10-CM | POA: Diagnosis not present

## 2016-01-16 DIAGNOSIS — Y999 Unspecified external cause status: Secondary | ICD-10-CM | POA: Insufficient documentation

## 2016-01-16 DIAGNOSIS — W19XXXA Unspecified fall, initial encounter: Secondary | ICD-10-CM

## 2016-01-16 DIAGNOSIS — Z85118 Personal history of other malignant neoplasm of bronchus and lung: Secondary | ICD-10-CM | POA: Diagnosis not present

## 2016-01-16 DIAGNOSIS — Y939 Activity, unspecified: Secondary | ICD-10-CM | POA: Insufficient documentation

## 2016-01-16 DIAGNOSIS — Z79899 Other long term (current) drug therapy: Secondary | ICD-10-CM | POA: Insufficient documentation

## 2016-01-16 DIAGNOSIS — S8992XA Unspecified injury of left lower leg, initial encounter: Secondary | ICD-10-CM | POA: Diagnosis present

## 2016-01-16 DIAGNOSIS — S93402A Sprain of unspecified ligament of left ankle, initial encounter: Secondary | ICD-10-CM

## 2016-01-16 NOTE — ED Notes (Signed)
Patient transported to X-ray 

## 2016-01-16 NOTE — ED Notes (Signed)
Pt fell from tree this afternoon around 530pm -- Pt fell over 12 feet and the pt was up above the height of the ladder - Pt left tail bone all the way down to left foot is hurting - When pt fell he hit a limb and then hit concrete - Pt reports difficulty bearing weight and walking - Pt denies hitting head - Pt fiancee reports that when he hit the ground and got up to walk he became nauseated

## 2016-01-16 NOTE — ED Notes (Signed)
Pt transferred to xray - Will start IV and draw labs when returns from xray

## 2016-01-16 NOTE — ED Notes (Signed)
Pt presents after falling more than 12' from a tree that he was cutting a limb from. Pt denies LoC. Pt states he is having pain in L foot, L ankle, L hip, and tailbone. Pt landed on grass, concrete, and over tree limb. Pt was reported to be initially ambulatory after fall but has experienced progressive pain in L leg and lower back which has decreased mobility. Pt is presently taking chemotherapy for small cell lung CA.

## 2016-01-17 ENCOUNTER — Emergency Department: Payer: Medicaid Other

## 2016-01-17 MED ORDER — OXYCODONE HCL 10 MG PO TABS: 10.0000 mg | ORAL_TABLET | Freq: Four times a day (QID) | ORAL | Status: DC | PRN

## 2016-01-17 MED ORDER — OXYCODONE HCL ER 10 MG PO T12A
EXTENDED_RELEASE_TABLET | ORAL | Status: AC
  Filled 2016-01-17: qty 1

## 2016-01-17 MED ORDER — OXYCODONE HCL 5 MG PO TABS
ORAL_TABLET | ORAL | Status: AC
  Administered 2016-01-17: 10 mg
  Filled 2016-01-17: qty 2

## 2016-01-17 NOTE — Discharge Instructions (Signed)
Contusion A contusion is a deep bruise. Contusions are the result of a blunt injury to tissues and muscle fibers under the skin. The injury causes bleeding under the skin. The skin overlying the contusion may turn blue, purple, or yellow. Minor injuries will give you a painless contusion, but more severe contusions may stay painful and swollen for a few weeks.  CAUSES  This condition is usually caused by a blow, trauma, or direct force to an area of the body. SYMPTOMS  Symptoms of this condition include:  Swelling of the injured area.  Pain and tenderness in the injured area.  Discoloration. The area may have redness and then turn blue, purple, or yellow. DIAGNOSIS  This condition is diagnosed based on a physical exam and medical history. An X-ray, CT scan, or MRI may be needed to determine if there are any associated injuries, such as broken bones (fractures). TREATMENT  Specific treatment for this condition depends on what area of the body was injured. In general, the best treatment for a contusion is resting, icing, applying pressure to (compression), and elevating the injured area. This is often called the RICE strategy. Over-the-counter anti-inflammatory medicines may also be recommended for pain control.  HOME CARE INSTRUCTIONS   Rest the injured area.  If directed, apply ice to the injured area:  Put ice in a plastic bag.  Place a towel between your skin and the bag.  Leave the ice on for 20 minutes, 2-3 times per day.  If directed, apply light compression to the injured area using an elastic bandage. Make sure the bandage is not wrapped too tightly. Remove and reapply the bandage as directed by your health care provider.  If possible, raise (elevate) the injured area above the level of your heart while you are sitting or lying down.  Take over-the-counter and prescription medicines only as told by your health care provider. SEEK MEDICAL CARE IF:  Your symptoms do not  improve after several days of treatment.  Your symptoms get worse.  You have difficulty moving the injured area. SEEK IMMEDIATE MEDICAL CARE IF:   You have severe pain.  You have numbness in a hand or foot.  Your hand or foot turns pale or cold.   This information is not intended to replace advice given to you by your health care provider. Make sure you discuss any questions you have with your health care provider.   Document Released: 05/05/2005 Document Revised: 04/16/2015 Document Reviewed: 12/11/2014 Elsevier Interactive Patient Education 2016 Else[employer]  Ankle Sprain An ankle sprain is an injury to the strong, fibrous tissues (ligaments) that hold the bones of your ankle joint together.  CAUSES An ankle sprain is usually caused by a fall or by twisting your ankle. Ankle sprains most commonly occur when you step on the outer edge of your foot, and your ankle turns inward. People who participate in sports are more prone to these types of injuries.  SYMPTOMS   Pain in your ankle. The pain may be present at rest or only when you are trying to stand or walk.  Swelling.  Bruising. Bruising may develop immediately or within 1 to 2 days after your injury.  Difficulty standing or walking, particularly when turning corners or changing directions. DIAGNOSIS  Your caregiver will ask you details about your injury and perform a physical exam of your ankle to determine if you have an ankle sprain. During the physical exam, your caregiver will press on and apply pressure to specific  areas of your foot and ankle. Your caregiver will try to move your ankle in certain ways. An X-ray exam may be done to be sure a bone was not broken or a ligament did not separate from one of the bones in your ankle (avulsion fracture).  TREATMENT  Certain types of braces can help stabilize your ankle. Your caregiver can make a recommendation for this. Your caregiver may recommend the use of medicine for pain.  If your sprain is severe, your caregiver may refer you to a surgeon who helps to restore function to parts of your skeletal system (orthopedist) or a physical therapist. [place] ice to your injury for 1-2 days or as directed by your caregiver. Applying ice helps to reduce inflammation and pain.  Put ice in a plastic bag.  Place a towel between your skin and the bag.  Leave the ice on for 15-20 minutes at a time, every 2 hours while you are awake.  Only take over-the-counter or prescription medicines for pain, discomfort, or fever as directed by your caregiver.  Elevate your injured ankle above the level of your heart as much as possible for 2-3 days.  If your caregiver recommends crutches, use them as instructed. Gradually put weight on the affected ankle. Continue to use crutches or a cane until you can walk without feeling pain in your ankle.  If you have a plaster splint, wear the splint as directed by your caregiver. Do not rest it on anything harder than a pillow for the first 24 hours. Do not put weight on it. Do not get it wet. You may take it off to take a shower or bath.  You may have been given an elastic bandage to wear around your ankle to provide support. If the elastic bandage is too tight (you have numbness or tingling in your foot or your foot becomes cold and blue), adjust the bandage to make it comfortable.  If you have an air splint, you may blow more air into it or let air out to make it more comfortable. You may take your splint off at night and before taking a shower or bath. Wiggle your toes in the splint several times per day to decrease swelling. SEEK MEDICAL CARE IF:   You have rapidly increasing bruising or swelling.  Your toes feel extremely cold or you lose feeling in your foot.  Your pain is not relieved with medicine. SEEK IMMEDIATE MEDICAL CARE IF:  Your toes are numb or blue.  You have severe pain that is increasing. MAKE SURE  YOU:   Understand these instructions.  Will watch your condition.  Will get help right away if you are not doing well or get worse.   This information is not intended to replace advice given to you by your health care provider. Make sure you discuss any questions you have with your health care provider.   Document Released: 07/26/2005 Document Revised: 08/16/2014 Document Reviewed: 08/07/2011 Elsevier Interactive Patient Education [employer].

## 2016-01-17 NOTE — ED Provider Notes (Signed)
[facility] Emergency Department Provider Note  ____________________________________________  Time seen: 12 5 AM  I have reviewed the triage vital signs and the nursing notes.   HISTORY  Chief Complaint Fall     HPI David [name] is a 60 y.o. male presents with history of falling approximately 14 feet out of a tree at 5:30 PM yesterday afternoon. Patient states that he fell onto his "bottom". Patient states he struck a limb of the triage the way down. Patient denies any head injury no loss of consciousness. Admits to 10 out of 10 left ankle and buttocks pain at this time. Pain is worse with movement.    Past Medical History  Diagnosis Date  . History of melanoma   . COPD (chronic obstructive pulmonary disease) (HCC)     not on home o2  . Small cell lung cancer ([place])   . Tobacco use     Patient Active Problem List   Diagnosis Date Noted  . Sepsis ([place]) 12/31/2015  . Small cell lung cancer ([place]) 09/08/2015    Past Surgical History  Procedure Laterality Date  . Skin graft      [facility]  . Peripheral vascular catheterization N/A 09/15/2015    Procedure: Glori Luis Cath Insertion;  Surgeon: Algernon Huxley, MD;  Location: [place] [facility];  Service: Cardiovascular;  Laterality: N/A;    Current Outpatient Rx  Name  Route  Sig  Dispense  Refill  . albuterol (PROVENTIL HFA;VENTOLIN HFA) 108 (90 Base) MCG/ACT inhaler   Inhalation   Inhale 2 puffs into the lungs every 6 (six) hours as needed for wheezing or shortness of breath.   1 Inhaler   2   . levofloxacin (LEVAQUIN) 500 MG tablet   Oral   Take 1 tablet (500 mg total) by mouth daily.   5 tablet   0   . ondansetron (ZOFRAN) 8 MG tablet   Oral   Take 1 tablet (8 mg total) by mouth 2 (two) times daily as needed. Start on the third day after cisplatin chemotherapy.   30 tablet   1   . Oxycodone HCl 10 MG TABS   Oral   Take 1 tablet (10 mg total) by mouth every 6 (six) hours as needed.   84  tablet   0   . prochlorperazine (COMPAZINE) 10 MG tablet   Oral   Take 1 tablet (10 mg total) by mouth every 6 (six) hours as needed (Nausea or vomiting).   30 tablet   1     Allergies Tylenol and Vicodin  Family History  Problem Relation Age of Onset  . Lung cancer Mother   . Cirrhosis Father     Social History Social History  Substance Use Topics  . Smoking status: Current Every Day Smoker -- 1.00 packs/day for 51 years    Types: Cigarettes    Start date: 08/12/1994  . Smokeless tobacco: Never Used     Comment: used to smoke upto 2PPD, recently cut down  . Alcohol Use: No    Review of Systems  Constitutional: Negative for fever. Eyes: Negative for visual changes. ENT: Negative for sore throat. Cardiovascular: Negative for chest pain. Respiratory: Negative for shortness of breath. Gastrointestinal: Negative for abdominal pain, vomiting and diarrhea. Genitourinary: Negative for dysuria. Musculoskeletal: Negative for back pain.Positive for left ankle and Botox. Skin: Negative for rash. Neurological: Negative for headaches, focal weakness or numbness.   10-point ROS otherwise negative.  ____________________________________________   PHYSICAL EXAM:  VITAL SIGNS: ED Triage Vitals  Enc Vitals Group     BP 01/16/16 2332 135/73 mmHg     Pulse Rate 01/16/16 2332 96     Resp 01/16/16 2332 20     Temp 01/16/16 2332 99.2 F (37.3 C)     Temp Source 01/16/16 2332 Oral     SpO2 01/16/16 2332 100 %     Weight 01/16/16 2332 138 lb (62.596 kg)     Height 01/16/16 2332 '5\' 9"'$  (1.753 m)     Head Cir --      Peak Flow --      Pain Score 01/16/16 2333 10     Pain Loc --      Pain Edu? --      Excl. in [place]? --      Constitutional: Alert and oriented. Well appearing and in no distress. Eyes: Conjunctivae are normal. PERRL. Normal extraocular movements. ENT   Head: Normocephalic and atraumatic.   Nose: No congestion/rhinnorhea.   Mouth/Throat: Mucous  membranes are moist.   Neck: No stridor. Hematological/Lymphatic/Immunilogical: No cervical lymphadenopathy. Cardiovascular: Normal rate, regular rhythm. Normal and symmetric distal pulses are present in all extremities. No murmurs, rubs, or gallops. Respiratory: Normal respiratory effort without tachypnea nor retractions. Breath sounds are clear and equal bilaterally. No wheezes/rales/rhonchi. Gastrointestinal: Soft and nontender. No distention. There is no CVA tenderness. Genitourinary: deferred Musculoskeletal: Pain to palpation left lateral malleoli. Patient also has pain to palpation right gluteal region. No pain with palpation of the lumbar spine  Neurologic:  Normal speech and language. No gross focal neurologic deficits are appreciated. Speech is normal.  Skin:  Skin is warm, dry and intact. No rash noted. Psychiatric: Mood and affect are normal. Speech and behavior are normal. Patient exhibits appropriate insight and judgment.    RADIOLOGY DG Ankle Complete Left (Final result) Result time: 01/17/16 00:34:59   Final result by Rad Results In Interface (01/17/16 00:34:59)   Narrative:   CLINICAL DATA: 60 year old male with fall and left lateral ankle pain.  EXAM: LEFT ANKLE COMPLETE - 3+ VIEW  COMPARISON: None.  FINDINGS: There is no evidence of fracture, dislocation, or joint effusion. There is no evidence of arthropathy or other focal bone abnormality. Soft tissues are unremarkable.  IMPRESSION: Negative.   Electronically Signed By: Anner Crete M.D. On: 01/17/2016 00:34          DG Hip Unilat With Pelvis 2-3 Views Left (Final result) Result time: 01/17/16 00:14:42   Final result by Rad Results In Interface (01/17/16 00:14:42)   Narrative:   CLINICAL DATA: Acute onset of left hip and ankle pain status post fall 12 feet from tree. Initial encounter.  EXAM: DG HIP (WITH OR WITHOUT PELVIS) 2-3V LEFT  COMPARISON: None.  FINDINGS: There  is no evidence of fracture or dislocation. Both femoral heads are seated normally within their respective acetabula. The proximal left femur appears intact. No significant degenerative change is appreciated. The sacroiliac joints are unremarkable in appearance.  The visualized bowel gas pattern is grossly unremarkable in appearance.  IMPRESSION: No evidence of fracture or dislocation.   Electronically Signed By: Garald Balding M.D. On: 01/17/2016 00:14           INITIAL IMPRESSION / ASSESSMENT AND PLAN / ED COURSE  Pertinent labs & imaging results that were available during my care of the patient were reviewed by me and considered in my medical decision making (see chart for details).  She given oxycodone 10 mg in the emergency department.  X-ray of the ankle and pelvis revealed no evidence of fracture or dislocation.  ____________________________________________   FINAL CLINICAL IMPRESSION(S) / ED DIAGNOSES  Final diagnoses:  Sacral contusion, initial encounter  Left ankle sprain, initial encounter      Gregor Hams, MD 01/17/16 [phone]

## 2016-01-21 ENCOUNTER — Encounter: Payer: Self-pay | Admitting: *Deleted

## 2016-01-21 ENCOUNTER — Telehealth: Payer: Self-pay

## 2016-01-21 ENCOUNTER — Ambulatory Visit: Payer: Medicaid Other

## 2016-01-21 NOTE — Telephone Encounter (Signed)
Note ready for pickup. Will contact patients family so they can pick up letter.

## 2016-01-21 NOTE — Telephone Encounter (Signed)
Patient is getting ready to move to an apartment that does not allow pets but someone told them if they had a MD note then he would be able to keep his dog.  His wife says that he would not do well at all if he is not able to keep his dog so they are asking for a MD note.

## 2016-01-22 ENCOUNTER — Inpatient Hospital Stay: Payer: Medicaid Other | Admitting: Oncology

## 2016-01-23 ENCOUNTER — Ambulatory Visit: Admission: RE | Admit: 2016-01-23 | Payer: Medicaid Other | Source: Ambulatory Visit

## 2016-01-27 ENCOUNTER — Other Ambulatory Visit: Payer: Self-pay | Admitting: *Deleted

## 2016-01-27 ENCOUNTER — Ambulatory Visit: Payer: Medicaid Other | Admitting: Oncology

## 2016-01-27 DIAGNOSIS — C3491 Malignant neoplasm of unspecified part of right bronchus or lung: Secondary | ICD-10-CM

## 2016-01-28 ENCOUNTER — Ambulatory Visit: Admission: RE | Admit: 2016-01-28 | Payer: Medicaid Other | Source: Ambulatory Visit

## 2016-01-30 ENCOUNTER — Other Ambulatory Visit: Payer: Self-pay | Admitting: *Deleted

## 2016-01-30 MED ORDER — OXYCODONE HCL 10 MG PO TABS: 10.0000 mg | ORAL_TABLET | Freq: Four times a day (QID) | ORAL | Status: DC | PRN

## 2016-02-02 ENCOUNTER — Ambulatory Visit: Payer: Medicaid Other | Admitting: Oncology

## 2016-02-05 ENCOUNTER — Telehealth: Payer: Self-pay | Admitting: *Deleted

## 2016-02-05 ENCOUNTER — Ambulatory Visit
Admission: RE | Admit: 2016-02-05 | Discharge: 2016-02-05 | Disposition: A | Discharge status: Home / Self Care | Payer: Medicaid Other | Source: Ambulatory Visit | Attending: Oncology | Admitting: Oncology

## 2016-02-05 DIAGNOSIS — C3411 Malignant neoplasm of upper lobe, right bronchus or lung: Secondary | ICD-10-CM | POA: Insufficient documentation

## 2016-02-05 DIAGNOSIS — I700 Atherosclerosis of aorta: Secondary | ICD-10-CM | POA: Diagnosis not present

## 2016-02-05 DIAGNOSIS — C3491 Malignant neoplasm of unspecified part of right bronchus or lung: Secondary | ICD-10-CM | POA: Insufficient documentation

## 2016-02-05 MED ORDER — IOPAMIDOL (ISOVUE-300) INJECTION 61%
75.0000 mL | Freq: Once | INTRAVENOUS | Status: AC | PRN
  Administered 2016-02-05: 75 mL via INTRAVENOUS

## 2016-02-05 MED ORDER — OXYCODONE HCL 10 MG PO TABS: 10.0000 mg | ORAL_TABLET | Freq: Four times a day (QID) | ORAL | Status: DC | PRN

## 2016-02-05 NOTE — Telephone Encounter (Signed)
Prescription will be ready for pick up at [place] made aware and verbalized understanding.

## 2016-02-05 NOTE — Telephone Encounter (Signed)
Patient requesting refill for Oxycodone 10 mg.

## 2016-02-09 ENCOUNTER — Inpatient Hospital Stay: Payer: Medicaid Other

## 2016-02-09 ENCOUNTER — Inpatient Hospital Stay: Payer: Medicaid Other | Attending: Oncology | Admitting: Oncology

## 2016-02-09 VITALS — BP 112/67 | HR 74 | Temp 97.7°F | Resp 18 | Wt 132.9 lb

## 2016-02-09 DIAGNOSIS — D6481 Anemia due to antineoplastic chemotherapy: Secondary | ICD-10-CM | POA: Diagnosis not present

## 2016-02-09 DIAGNOSIS — Z452 Encounter for adjustment and management of vascular access device: Secondary | ICD-10-CM | POA: Insufficient documentation

## 2016-02-09 DIAGNOSIS — Z85820 Personal history of malignant melanoma of skin: Secondary | ICD-10-CM | POA: Diagnosis not present

## 2016-02-09 DIAGNOSIS — Z95828 Presence of other vascular implants and grafts: Secondary | ICD-10-CM

## 2016-02-09 DIAGNOSIS — C3491 Malignant neoplasm of unspecified part of right bronchus or lung: Secondary | ICD-10-CM

## 2016-02-09 DIAGNOSIS — R51 Headache: Secondary | ICD-10-CM

## 2016-02-09 DIAGNOSIS — C3411 Malignant neoplasm of upper lobe, right bronchus or lung: Secondary | ICD-10-CM

## 2016-02-09 DIAGNOSIS — Z9221 Personal history of antineoplastic chemotherapy: Secondary | ICD-10-CM | POA: Diagnosis not present

## 2016-02-09 DIAGNOSIS — F17210 Nicotine dependence, cigarettes, uncomplicated: Secondary | ICD-10-CM | POA: Insufficient documentation

## 2016-02-09 DIAGNOSIS — M109 Gout, unspecified: Secondary | ICD-10-CM | POA: Diagnosis not present

## 2016-02-09 DIAGNOSIS — Z79899 Other long term (current) drug therapy: Secondary | ICD-10-CM | POA: Diagnosis not present

## 2016-02-09 DIAGNOSIS — J449 Chronic obstructive pulmonary disease, unspecified: Secondary | ICD-10-CM | POA: Diagnosis not present

## 2016-02-09 MED ORDER — HEPARIN SOD (PORK) LOCK FLUSH 100 UNIT/ML IV SOLN
500.0000 [IU] | Freq: Once | INTRAVENOUS | Status: AC
  Administered 2016-02-09: 500 [IU] via INTRAVENOUS

## 2016-02-09 MED ORDER — HEPARIN SOD (PORK) LOCK FLUSH 100 UNIT/ML IV SOLN
INTRAVENOUS | Status: AC
  Filled 2016-02-09: qty 5

## 2016-02-09 MED ORDER — COLCHICINE 0.6 MG PO TABS: 0.6000 mg | ORAL_TABLET | Freq: Every day | ORAL | Status: DC

## 2016-02-09 MED ORDER — SODIUM CHLORIDE 0.9% FLUSH
10.0000 mL | INTRAVENOUS | Status: DC | PRN
  Administered 2016-02-09: 10 mL via INTRAVENOUS
  Filled 2016-02-09: qty 10

## 2016-02-09 NOTE — Progress Notes (Signed)
Pt here to get his CT chest results today. Patient states he has intermittent chest pain "hurting in his lungs". He coughs up phlegm, thick and creamy. Patient states his gout has flared up from all the chemotherapy. He would like a strong gout medication. Prescribed by Dr Grayland Ormond.

## 2016-02-09 NOTE — Progress Notes (Signed)
David [name]  [phone] [phone]  ID: David [name] OB: [birth year]  MR#: [id number]  PYK#:[id number]  Patient Care Team: Lloyd Huger, MD as PCP - General (Oncology)  CHIEF COMPLAINT:  No chief complaint on file.   INTERVAL HISTORY: Patient returns to clinic today for further evaluation and discussion of his imaging results. He is complaining of significant "gout pain", but otherwise feels well. He does not complain of headache. His appetite has improved and he is gaining weight.  He has no other neurologic complaints. He denies any fevers. He denies any chest pain, shortness of breath, cough, or hemoptysis. He denies any nausea, vomiting, constipation, or diarrhea. He has no urinary complaints. Patient offers no further specific complaints.  REVIEW OF SYSTEMS:   Review of Systems  Constitutional: Negative for fever, weight loss and malaise/fatigue.  HENT: Negative for tinnitus.   Respiratory: Negative for cough, hemoptysis and shortness of breath.   Cardiovascular: Negative.  Negative for chest pain.  Gastrointestinal: Negative.   Genitourinary: Negative.   Musculoskeletal: Positive for joint pain.  Neurological: Negative.  Negative for weakness and headaches.  Psychiatric/Behavioral: Negative.     As per HPI. Otherwise, a complete review of systems is negatve.  PAST MEDICAL HISTORY: Past Medical History  Diagnosis Date  . History of melanoma   . COPD (chronic obstructive pulmonary disease) (HCC)     not on home o2  . Small cell lung cancer ([place])   . Tobacco use     PAST SURGICAL HISTORY: Past Surgical History  Procedure Laterality Date  . Skin graft      [facility]  . Peripheral vascular catheterization N/A 09/15/2015    Procedure: Glori Luis Cath Insertion;  Surgeon: Algernon Huxley, MD;  Location: [place] [facility];  Service: Cardiovascular;  Laterality: N/A;    FAMILY HISTORY: Reviewed and unchanged. No reported history of  malignancy or chronic disease.     ADVANCED DIRECTIVES:    HEALTH MAINTENANCE: Social History  Substance Use Topics  . Smoking status: Current Every Day Smoker -- 1.00 packs/day for 51 years    Types: Cigarettes    Start date: 08/12/1994  . Smokeless tobacco: Never Used     Comment: used to smoke upto 2PPD, recently cut down  . Alcohol Use: No     Colonoscopy:  PAP:  Bone density:  Lipid panel:  Allergies  Allergen Reactions  . Tylenol [Acetaminophen] Other (See Comments)    Unable to take due to chemotherapy regimen  . Vicodin [Hydrocodone-Acetaminophen] Nausea Only    Current Outpatient Prescriptions  Medication Sig Dispense Refill  . albuterol (PROVENTIL HFA;VENTOLIN HFA) 108 (90 Base) MCG/ACT inhaler Inhale 2 puffs into the lungs every 6 (six) hours as needed for wheezing or shortness of breath. 1 Inhaler 2  . Oxycodone HCl 10 MG TABS Take 1 tablet (10 mg total) by mouth every 6 (six) hours as needed. 60 tablet 0  . colchicine 0.6 MG tablet Take 1 tablet (0.6 mg total) by mouth daily. 30 tablet 1  . ondansetron (ZOFRAN) 8 MG tablet Take 1 tablet (8 mg total) by mouth 2 (two) times daily as needed. Start on the third day after cisplatin chemotherapy. (Patient not taking: Reported on 02/09/2016) 30 tablet 1  . prochlorperazine (COMPAZINE) 10 MG tablet Take 1 tablet (10 mg total) by mouth every 6 (six) hours as needed (Nausea or vomiting). (Patient not taking: Reported on 02/09/2016) 30 tablet 1   No current facility-administered medications for this  visit.   Facility-Administered Medications Ordered in Other Visits  Medication Dose Route Frequency Provider Last Rate Last Dose  . 0.9 %  sodium chloride infusion   Intravenous Once Lloyd Huger, MD        OBJECTIVE: Filed Vitals:   02/09/16 1440  BP: 112/67  Pulse: 74  Temp: 97.7 F (36.5 C)  Resp: 18     Body mass index is 19.62 kg/(m^2).    ECOG FS:0 - Asymptomatic  General: Thin, no acute distress. Eyes:  Pink conjunctiva, anicteric sclera. Lungs: Clear to auscultation bilaterally. Heart: Regular rate and rhythm. No rubs, murmurs, or gallops. Abdomen: Soft, nontender, nondistended. No organomegaly noted, normoactive bowel sounds. Musculoskeletal: No edema, cyanosis, or clubbing. Neuro: Alert, answering all questions appropriately. Cranial nerves grossly intact. Skin: No rashes or petechiae noted. Psych: Normal affect.  LAB RESULTS:  Lab Results  Component Value Date   NA 138 01/01/2016   K 3.7 01/01/2016   CL 108 01/01/2016   CO2 25 01/01/2016   GLUCOSE 89 01/01/2016   BUN 10 01/01/2016   CREATININE 0.84 01/01/2016   CALCIUM 8.2* 01/01/2016   PROT 7.2 12/31/2015   ALBUMIN 3.9 12/31/2015   AST 18 12/31/2015   ALT 12* 12/31/2015   ALKPHOS 111 12/31/2015   BILITOT 0.3 12/31/2015   GFRNONAA >60 01/01/2016   GFRAA >60 01/01/2016    Lab Results  Component Value Date   WBC 10.3 01/02/2016   NEUTROABS 14.1* 12/31/2015   HGB 8.7* 01/02/2016   HCT 25.8* 01/02/2016   MCV 93.3 01/02/2016   PLT 128* 01/02/2016     STUDIES: Dg Ankle Complete Left  11-Feb-2016  CLINICAL DATA:  60 year old male with fall and left lateral ankle pain. EXAM: LEFT ANKLE COMPLETE - 3+ VIEW COMPARISON:  None. FINDINGS: There is no evidence of fracture, dislocation, or joint effusion. There is no evidence of arthropathy or other focal bone abnormality. Soft tissues are unremarkable. IMPRESSION: Negative. Electronically Signed   By: Anner Crete M.D.   On: Feb 11, 2016 00:34   Ct Chest W Contrast  02/05/2016  CLINICAL DATA:  Followup right lung small cell carcinoma. Undergoing radiation therapy and chemotherapy. Cough. COPD. Personal history of melanoma. EXAM: CT CHEST WITH CONTRAST TECHNIQUE: Multidetector CT imaging of the chest was performed during intravenous contrast administration. CONTRAST:  70m ISOVUE-300 IOPAMIDOL (ISOVUE-300) INJECTION 61% COMPARISON:  Chest CT on 08/11/2015 FINDINGS:  Cardiovascular:  No acute findings.  Aortic atherosclerosis noted. Mediastinum/Lymph Nodes: Right hilar adenopathy shows mild decrease, currently measuring 1.4 cm on image 89/series 2 compared to 1.7 cm previously. Tiny sub-cm right paratracheal lymph nodes remain stable. No new lymphadenopathy identified. Lungs/Pleura: Moderate to severe centrilobular emphysema again noted. Right upper lobe pulmonary nodule currently measures 1.4 x 1.3 cm on image 69 compared to 2.6 x 1.7 cm previously. There is peripheral streaky opacity, most likely due to postobstructive atelectasis or radiation change. Linear opacity in the posterior right upper lobe on image 73 is also stable and likely due to mucoid impaction. Small calcified granulomas in bilateral upper lobes and right middle lobe remain stable. Several sub-cm noncalcified pulmonary nodules in the left lower lobe measuring up to 5 mm are also stable. No evidence of pleural effusion. Musculoskeletal/Soft Tissues: No suspicious bone lesions or other significant chest wall abnormality. Upper Abdomen:  Unremarkable. IMPRESSION: Decreased size of primary right upper lobe carcinoma since previous study. Mild decrease in right hilar lymphadenopathy also noted. No new or progressive disease identified within the thorax. Stable sub-cm noncalcified  pulmonary nodules, suggesting benign etiology. Recommend continued attention on follow-up imaging. Aortic atherosclerosis and moderate to severe emphysema noted. Electronically Signed   By: Earle Gell M.D.   On: 02/05/2016 16:50   Dg Hip Unilat With Pelvis 2-3 Views Left  01/17/2016  CLINICAL DATA:  Acute onset of left hip and ankle pain status post fall 12 feet from tree. Initial encounter. EXAM: DG HIP (WITH OR WITHOUT PELVIS) 2-3V LEFT COMPARISON:  None. FINDINGS: There is no evidence of fracture or dislocation. Both femoral heads are seated normally within their respective acetabula. The proximal left femur appears intact. No  significant degenerative change is appreciated. The sacroiliac joints are unremarkable in appearance. The visualized bowel gas pattern is grossly unremarkable in appearance. IMPRESSION: No evidence of fracture or dislocation. Electronically Signed   By: Garald Balding M.D.   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  PLAN:    1. Small cell lung cancer: Restaging PET scan was denied by insurance.  CT scan results from February 05, 2016 reviewed independently and reported as above with significant improvement of patient's known disease burden. No intervention is needed at this time. Patient does not require additional chemotherapy. He has been referred back to radiation oncology for consideration of PCI. Return to clinic in 3 months with repeat laboratory work and imaging.  2. Weight loss: Resolved. Patient now gaining weight. 3. History of melanoma: Patient is a poor historian but appears to have been greater than 3 years ago. Monitor. 4. Pain: Continue current narcotic regimen. Monitor closely, there is suspicion of drug-seeking behavior. Now the patient's cancer has resolved and is no longer under treatment, will minimize the amount of narcotics he receives from this clinic. 5. Anemia: Mild, secondary to chemotherapy. Monitor. 6. Cough: Recommended patient try OTC medications. Patient was also given a prescription for albuterol and a pulmonary referral. 7. 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 Patient does not complain of this today. 8. Headaches: Recent MRI the brain did not reveal metastatic disease. 9. Gout: Patient was given a prescription for colchicine.   Patient expressed understanding and was in agreement with this plan. He also understands that He can call clinic at any time with any questions, concerns, or complaints.   Lloyd Huger, MD   02/09/2016 9:48 PM

## 2016-02-18 ENCOUNTER — Other Ambulatory Visit: Payer: Self-pay | Admitting: *Deleted

## 2016-02-18 ENCOUNTER — Telehealth: Payer: Self-pay | Admitting: *Deleted

## 2016-02-18 ENCOUNTER — Inpatient Hospital Stay: Admission: RE | Admit: 2016-02-18 | Payer: Medicaid Other | Source: Ambulatory Visit | Admitting: Radiation Oncology

## 2016-02-18 ENCOUNTER — Ambulatory Visit: Payer: Medicaid Other | Admitting: Radiation Oncology

## 2016-02-18 MED ORDER — OXYCODONE HCL 10 MG PO TABS: 10.0000 mg | ORAL_TABLET | Freq: Four times a day (QID) | ORAL | Status: DC | PRN

## 2016-02-18 NOTE — Telephone Encounter (Signed)
Pt came into clinic wanting refill of oxycodone. Pt received #60 on 6/29 and is not due for refill until Friday 7/14. Informed pt that can come back on Friday to get refill of oxycodone. Pt was made aware that Dr. Grayland Ormond would like to wean him off narcotics and that he can expect no further refills of oxycodone in the future after Friday. Pt verbalized understanding.

## 2016-02-19 ENCOUNTER — Telehealth: Payer: Self-pay

## 2016-02-19 DIAGNOSIS — C3491 Malignant neoplasm of unspecified part of right bronchus or lung: Secondary | ICD-10-CM

## 2016-02-19 NOTE — Telephone Encounter (Signed)
Patient stated that he wanted to pick up pain medication Rx.  Confirmed with patient that Rx would not be available for him until tomorrow.  02/20/2016

## 2016-02-23 ENCOUNTER — Ambulatory Visit: Payer: Medicaid Other | Admitting: Radiation Oncology

## 2016-02-23 ENCOUNTER — Inpatient Hospital Stay: Admission: RE | Admit: 2016-02-23 | Payer: Medicaid Other | Source: Ambulatory Visit | Admitting: Radiation Oncology

## 2016-02-24 ENCOUNTER — Encounter: Payer: Self-pay | Admitting: Radiation Oncology

## 2016-02-24 ENCOUNTER — Ambulatory Visit
Admission: RE | Admit: 2016-02-24 | Discharge: 2016-02-24 | Disposition: A | Discharge status: Home / Self Care | Payer: Medicaid Other | Source: Ambulatory Visit | Attending: Radiation Oncology | Admitting: Radiation Oncology

## 2016-02-24 VITALS — BP 114/67 | HR 94 | Temp 97.0°F | Resp 18 | Wt 131.4 lb

## 2016-02-24 DIAGNOSIS — C3412 Malignant neoplasm of upper lobe, left bronchus or lung: Secondary | ICD-10-CM | POA: Insufficient documentation

## 2016-02-24 DIAGNOSIS — C7931 Secondary malignant neoplasm of brain: Secondary | ICD-10-CM | POA: Diagnosis not present

## 2016-02-24 DIAGNOSIS — Z510 Encounter for antineoplastic radiation therapy: Secondary | ICD-10-CM | POA: Insufficient documentation

## 2016-02-24 DIAGNOSIS — C3491 Malignant neoplasm of unspecified part of right bronchus or lung: Secondary | ICD-10-CM

## 2016-02-24 NOTE — Progress Notes (Signed)
Radiation Oncology Follow up Note  Name: David [name]   Date:   02/24/2016 MRN:  [id number] DOB: 01-21-1956    This 60 y.o. male presents to the clinic today for evaluation for whole brain radiation for stage IIIa small cell lung cancer status post completion of chemotherapy and radiation with excellent response.  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 He is seen today and doing well.. He specifically denies cough hemoptysis chest tightness or any change in neurologic status. He recently had repeat CT scan of the chest showing excellent response with decreased size of the primary right upper lobe tumor and decrease in right hilar lymphadenopathy. He is seen today for consideration of PCI.  COMPLICATIONS OF TREATMENT: none  FOLLOW UP COMPLIANCE: keeps appointments   PHYSICAL EXAM:  BP 114/67 mmHg  Pulse 94  Temp(Src) 97 F (36.1 C)  Resp 18  Wt 131 lb 6.3 oz (59.6 kg) Well-developed well-nourished patient in NAD. HEENT reveals PERLA, EOMI, discs not visualized.  Oral cavity is clear. No oral mucosal lesions are identified. Neck is clear without evidence of cervical or supraclavicular adenopathy. Lungs are clear to A&P. Cardiac examination is essentially unremarkable with regular rate and rhythm without murmur rub or thrill. Abdomen is benign with no organomegaly or masses noted. Motor sensory and DTR levels are equal and symmetric in the upper and lower extremities. Cranial nerves II through XII are grossly intact. Proprioception is intact. No peripheral adenopathy or edema is identified. No motor or sensory levels are noted. Crude visual fields are within normal range.  RADIOLOGY RESULTS: Recent CT scans are reviewed and compatible with the above-stated findings  PLAN: At this time I to go ahead  with whole brain radiation therapy to eradicate microscopic residual disease from his small cell lung cancer. Would plan on delivering 3000 cGy over 3 weeks 180 cGy per fraction. Risks and benefits of treatment including hair loss fatigue alteration of blood counts and slight chance of possible neurocognitive decline all were discussed in detail with the patient. I personally ordered and set scheduled CT simulation for later this week. I will also put the patient on low-dose Decadron 4 mg once a day during his treatments. Patient seems to comprehend my treatment plan well.  I would like to take this opportunity to thank you for allowing me to participate in the care of your patient.Armstead Peaks., MD

## 2016-02-26 ENCOUNTER — Ambulatory Visit
Admission: RE | Admit: 2016-02-26 | Discharge: 2016-02-26 | Disposition: A | Discharge status: Home / Self Care | Payer: Medicaid Other | Source: Ambulatory Visit | Attending: Radiation Oncology | Admitting: Radiation Oncology

## 2016-02-26 DIAGNOSIS — Z510 Encounter for antineoplastic radiation therapy: Secondary | ICD-10-CM | POA: Diagnosis not present

## 2016-02-27 ENCOUNTER — Emergency Department: Payer: Medicaid Other

## 2016-02-27 ENCOUNTER — Emergency Department
Admission: EM | Admit: 2016-02-27 | Discharge: 2016-02-27 | Disposition: A | Discharge status: Home / Self Care | Payer: Medicaid Other | Attending: Emergency Medicine | Admitting: Emergency Medicine

## 2016-02-27 DIAGNOSIS — Z85820 Personal history of malignant melanoma of skin: Secondary | ICD-10-CM | POA: Insufficient documentation

## 2016-02-27 DIAGNOSIS — M25511 Pain in right shoulder: Secondary | ICD-10-CM | POA: Diagnosis present

## 2016-02-27 DIAGNOSIS — M25552 Pain in left hip: Secondary | ICD-10-CM

## 2016-02-27 DIAGNOSIS — Z79899 Other long term (current) drug therapy: Secondary | ICD-10-CM | POA: Diagnosis not present

## 2016-02-27 DIAGNOSIS — F17210 Nicotine dependence, cigarettes, uncomplicated: Secondary | ICD-10-CM | POA: Insufficient documentation

## 2016-02-27 DIAGNOSIS — M109 Gout, unspecified: Secondary | ICD-10-CM | POA: Insufficient documentation

## 2016-02-27 DIAGNOSIS — Z85118 Personal history of other malignant neoplasm of bronchus and lung: Secondary | ICD-10-CM | POA: Diagnosis not present

## 2016-02-27 DIAGNOSIS — J449 Chronic obstructive pulmonary disease, unspecified: Secondary | ICD-10-CM | POA: Insufficient documentation

## 2016-02-27 MED ORDER — DEXAMETHASONE SODIUM PHOSPHATE 10 MG/ML IJ SOLN
10.0000 mg | Freq: Once | INTRAMUSCULAR | Status: AC
  Administered 2016-02-27: 10 mg via INTRAMUSCULAR
  Filled 2016-02-27: qty 1

## 2016-02-27 MED ORDER — OXYCODONE HCL 5 MG PO TABS
5.0000 mg | ORAL_TABLET | Freq: Once | ORAL | Status: AC
  Administered 2016-02-27: 5 mg via ORAL
  Filled 2016-02-27: qty 1

## 2016-02-27 MED ORDER — PREDNISONE 10 MG PO TABS: 10.0000 mg | ORAL_TABLET | Freq: Every day | ORAL | Status: DC

## 2016-02-27 MED ORDER — OXYCODONE HCL 5 MG PO TABS: 5.0000 mg | ORAL_TABLET | Freq: Three times a day (TID) | ORAL | Status: DC | PRN

## 2016-02-27 NOTE — ED Provider Notes (Signed)
CSN: [id number]     Arrival date & time 02/27/16  2013 History   First MD Initiated Contact with Patient 02/27/16 2059     Chief Complaint  Patient presents with  . Gout     (Consider location/radiation/quality/duration/timing/severity/associated sxs/prior Treatment) HPI  60 year old David [name] with a history of gout presents to emergency department for evaluation of right shoulder and left hip pain. Patient's had several weeks of right shoulder and left hip pain that has not responded to colchicine. He denies any recent trauma or injury. He has pain with ambulation. Shoulder pain is greater than left hip pain. He describes the pain as a constant ache and discomfort that is increased to touch as well as with movement. Patient states his pain resembles his gout flareups. He has been taking ibuprofen as well as oxycodone prescribed by his cancer doctor with minimal improvement. He denies any chest pain or shortness of breath. No abdominal pain and back pain or difficulty urinating. He also denies any rashes, fevers, tick bites.   Past Medical History  Diagnosis Date  . History of melanoma   . COPD (chronic obstructive pulmonary disease) (HCC)     not on home o2  . Small cell lung cancer ([place])   . Tobacco use    Past Surgical History  Procedure Laterality Date  . Skin graft      [facility]  . Peripheral vascular catheterization N/A 09/15/2015    Procedure: Glori Luis Cath Insertion;  Surgeon: Algernon Huxley, MD;  Location: [facility];  Service: Cardiovascular;  Laterality: N/A;   Family History  Problem Relation Age of Onset  . Lung cancer Mother   . Cirrhosis Father    Social History  Substance Use Topics  . Smoking status: Current Every Day Smoker -- 1.00 packs/day for 51 years    Types: Cigarettes    Start date: 08/12/1994  . Smokeless tobacco: Never Used     Comment: used to smoke upto 2PPD, recently cut down  . Alcohol Use: No    Review of Systems  Constitutional: Negative.   Negative for fever, chills, activity change and appetite change.  HENT: Negative for congestion, ear pain, mouth sores, rhinorrhea, sinus pressure, sore throat and trouble swallowing.   Eyes: Negative for photophobia, pain and discharge.  Respiratory: Negative for cough, chest tightness and shortness of breath.   Cardiovascular: Negative for chest pain and leg swelling.  Gastrointestinal: Negative for nausea, vomiting, abdominal pain, diarrhea and abdominal distention.  Genitourinary: Negative for dysuria and difficulty urinating.  Musculoskeletal: Positive for arthralgias. Negative for back pain and joint swelling.  Skin: Negative for color change, rash and wound.  Neurological: Negative for dizziness and headaches.  Hematological: Negative for adenopathy.  Psychiatric/Behavioral: Negative for behavioral problems and agitation.      Allergies  Tylenol and Vicodin  Home Medications   Prior to Admission medications   Medication Sig Start Date End Date Taking? Authorizing Provider  albuterol (PROVENTIL HFA;VENTOLIN HFA) 108 (90 Base) MCG/ACT inhaler Inhale 2 puffs into the lungs every 6 (six) hours as needed for wheezing or shortness of breath. 12/08/15   Lloyd Huger, MD  colchicine 0.6 MG tablet Take 1 tablet (0.6 mg total) by mouth daily. 02/09/16   Lloyd Huger, MD  ondansetron (ZOFRAN) 8 MG tablet Take 1 tablet (8 mg total) by mouth 2 (two) times daily as needed. Start on the third day after cisplatin chemotherapy. 09/15/15   Lloyd Huger, MD  oxyCODONE (ROXICODONE) 5  MG immediate release tablet Take 1 tablet (5 mg total) by mouth every 8 (eight) hours as needed for severe pain. 02/27/16 02/26/17  Duanne Guess, PA-C  predniSONE (DELTASONE) 10 MG tablet Take 1 tablet (10 mg total) by mouth daily. 6,5,4,3,2,1 six day taper 02/27/16   Duanne Guess, PA-C  prochlorperazine (COMPAZINE) 10 MG tablet Take 1 tablet (10 mg total) by mouth every 6 (six) hours as needed (Nausea or  vomiting). 09/15/15   Lloyd Huger, MD   BP 110/65 mmHg  Pulse 92  Temp(Src) 98.3 F (36.8 C) (Oral)  Resp 20  Ht '5\' 9"'$  (1.753 m)  Wt 60.782 kg  BMI 19.78 kg/m2  SpO2 96% Physical Exam  Constitutional: He is oriented to person, place, and time. He appears well-developed and well-nourished.  HENT:  Head: Normocephalic and atraumatic.  Eyes: Conjunctivae and EOM are normal. Pupils are equal, round, and reactive to light.  Neck: Normal range of motion. Neck supple.  Cardiovascular: Normal rate, regular rhythm, normal heart sounds and intact distal pulses.   Pulmonary/Chest: Effort normal and breath sounds normal. No respiratory distress. He has no wheezes. He has no rales. He exhibits no tenderness.  Abdominal: Soft. Bowel sounds are normal. He exhibits no distension. There is no tenderness.  Musculoskeletal:  Examination of the right shoulder shows the patient has tenderness palpation to the anterior shoulder. There is no swelling warmth erythema or effusion. Range of motion is limited with internal and external rotation. He has painful internal, external rotation as well as abduction and flexion. He is able to abduct to 90. He is neurovascularly intact in right upper extremity.  Examination of the left hip shows the patient has good internal and external rotation with no discomfort. He is tender along the trochanteric bursa and anterior groin. There is no warmth erythema or skin rash noted. Patient is able to straight leg raise on the left side, has no tenderness palpation to the knee or ankle. He is neurovascular intact left lower extremity.  Neurological: He is alert and oriented to person, place, and time.  Skin: Skin is warm and dry.  Psychiatric: He has a normal mood and affect. His behavior is normal. Judgment and thought content normal.    ED Course  Procedures (including critical care time) Labs Review Labs Reviewed - No data to display  Imaging Review Dg Shoulder  Right  02/27/2016  CLINICAL DATA:  Pain. EXAM: RIGHT SHOULDER - 2+ VIEW COMPARISON:  None. FINDINGS: There is no evidence of fracture or dislocation. There is no evidence of arthropathy or other focal bone abnormality. Soft tissues are unremarkable. IMPRESSION: Negative. Electronically Signed   By: Dorise Bullion III M.D   On: 02/27/2016 22:42   Dg Hip Unilat With Pelvis 2-3 Views Left  02/27/2016  CLINICAL DATA:  Pain without trauma EXAM: DG HIP (WITH OR WITHOUT PELVIS) 2-3V LEFT COMPARISON:  None. FINDINGS: There is no evidence of hip fracture or dislocation. There is no evidence of arthropathy or other focal bone abnormality. IMPRESSION: Negative. Electronically Signed   By: Dorise Bullion III M.D   On: 02/27/2016 22:43   I have personally reviewed and evaluated these images and lab results as part of my medical decision-making.   EKG Interpretation None      MDM   Final diagnoses:  Shoulder pain, right  Left hip pain  Acute gout of multiple sites, unspecified cause    60 year old David [name] with chronic gout. Has had an acute flareup over  the last 2-3 weeks. No relief with colchicine, oxycodone. Patient has no evidence of acute bony abnormality on x-ray. No sign of effusion or septic joints. Patient is given 10 mg of dexamethasone IM along with a 6 day steroid taper. He is also given 15 tablets of oxycodone 5 mg taper severe pain. He'll follow-up with PCP in 2-3 days. He is educated on signs symptoms return to the ED for.   Duanne Guess, PA-C 02/27/16 2309  Rudene Re, MD 02/28/16 [phone]

## 2016-02-27 NOTE — Discharge Instructions (Signed)
Cryotherapy Cryotherapy is when you put ice on your injury. Ice helps lessen pain and puffiness (swelling) after an injury. Ice works the best when you start using it in the first 24 to 48 hours after an injury. HOME CARE  Put a dry or damp towel between the ice pack and your skin.  You may press gently on the ice pack.  Leave the ice on for no more than 10 to 20 minutes at a time.  Check your skin after 5 minutes to make sure your skin is okay.  Rest at least 20 minutes between ice pack uses.  Stop using ice when your skin loses feeling (numbness).  Do not use ice on someone who cannot tell you when it hurts. This includes small children and people with memory problems (dementia). GET HELP RIGHT AWAY IF:  You have white spots on your skin.  Your skin turns blue or pale.  Your skin feels waxy or hard.  Your puffiness gets worse. MAKE SURE YOU:   Understand these instructions.  Will watch your condition.  Will get help right away if you are not doing well or get worse.   This information is not intended to replace advice given to you by your health care provider. Make sure you discuss any questions you have with your health care provider.   Document Released: 01/12/2008 Document Revised: 10/18/2011 Document Reviewed: 03/18/2011 Elsevier Interactive Patient Education 2016 [employer].  Gout Gout is an inflammatory arthritis caused by a buildup of uric acid crystals in the joints. Uric acid is a chemical that is normally present in the blood. When the level of uric acid in the blood is too high it can form crystals that deposit in your joints and tissues. This causes joint redness, soreness, and swelling (inflammation). Repeat attacks are common. Over time, uric acid crystals can form into masses (tophi) near a joint, destroying bone and causing disfigurement. Gout is treatable and often preventable. CAUSES  The disease begins with elevated levels of uric acid in the blood.  Uric acid is produced by your body when it breaks down a naturally found substance called purines. Certain foods you eat, such as meats and fish, contain high amounts of purines. Causes of an elevated uric acid level include:  Being passed down from parent to child (heredity).  Diseases that cause increased uric acid production (such as obesity, psoriasis, and certain cancers).  Excessive alcohol use.  Diet, especially diets rich in meat and seafood.  Medicines, including certain cancer-fighting medicines (chemotherapy), water pills (diuretics), and aspirin.  Chronic kidney disease. The kidneys are no longer able to remove uric acid well.  Problems with metabolism. Conditions strongly associated with gout include:  Obesity.  High blood pressure.  High cholesterol.  Diabetes. Not everyone with elevated uric acid levels gets gout. It is not understood why some people get gout and others do not. Surgery, joint injury, and eating too much of certain foods are some of the factors that can lead to gout attacks. SYMPTOMS   An attack of gout comes on quickly. It causes intense pain with redness, swelling, and warmth in a joint.  Fever can occur.  Often, only one joint is involved. Certain joints are more commonly involved:  Base of the big toe.  Knee.  Ankle.  Wrist.  Finger. Without treatment, an attack usually goes away in a few days to weeks. Between attacks, you usually will not have symptoms, which is different from many other forms of  arthritis. DIAGNOSIS  Your caregiver will suspect gout based on your symptoms and exam. In some cases, tests may be recommended. The tests may include:  Blood tests.  Urine tests.  X-rays.  Joint fluid exam. This exam requires a needle to remove fluid from the joint (arthrocentesis). Using a microscope, gout is confirmed when uric acid crystals are seen in the joint fluid. TREATMENT  There are two phases to gout treatment: treating  the sudden onset (acute) attack and preventing attacks (prophylaxis).  Treatment of an Acute Attack.  Medicines are used. These include anti-inflammatory medicines or steroid medicines.  An injection of steroid medicine into the affected joint is sometimes necessary.  The painful joint is rested. Movement can worsen the arthritis.  You may use warm or cold treatments on painful joints, depending which works best for you.  Treatment to Prevent Attacks.  If you suffer from frequent gout attacks, your caregiver may advise preventive medicine. These medicines are started after the acute attack subsides. These medicines either help your kidneys eliminate uric acid from your body or decrease your uric acid production. You may need to stay on these medicines for a very long time.  The early phase of treatment with preventive medicine can be associated with an increase in acute gout attacks. For this reason, during the first few months of treatment, your caregiver may also advise you to take medicines usually used for acute gout treatment. Be sure you understand your caregiver's directions. Your caregiver may make several adjustments to your medicine dose before these medicines are effective.  Discuss dietary treatment with your caregiver or dietitian. Alcohol and drinks high in sugar and fructose and foods such as meat, poultry, and seafood can increase uric acid levels. Your caregiver or dietitian can advise you on drinks and foods that should be limited. HOME CARE INSTRUCTIONS   Do not take aspirin to relieve pain. This raises uric acid levels.  Only take over-the-counter or prescription medicines for pain, discomfort, or fever as directed by your caregiver.  Rest the joint as much as possible. When in bed, keep sheets and blankets off painful areas.  Keep the affected joint raised (elevated).  Apply warm or cold treatments to painful joints. Use of warm or cold treatments depends on which  works best for you.  Use crutches if the painful joint is in your leg.  Drink enough fluids to keep your urine clear or pale yellow. This helps your body get rid of uric acid. Limit alcohol, sugary drinks, and fructose drinks.  Follow your dietary instructions. Pay careful attention to the amount of protein you eat. Your daily diet should emphasize fruits, vegetables, whole grains, and fat-free or low-fat milk products. Discuss the use of coffee, vitamin C, and cherries with your caregiver or dietitian. These may be helpful in lowering uric acid levels.  Maintain a healthy body weight. SEEK MEDICAL CARE IF:   You develop diarrhea, vomiting, or any side effects from medicines.  You do not feel better in 24 hours, or you are getting worse. SEEK IMMEDIATE MEDICAL CARE IF:   Your joint becomes suddenly more tender, and you have chills or a fever. MAKE SURE YOU:   Understand these instructions.  Will watch your condition.  Will get help right away if you are not doing well or get worse.   This information is not intended to replace advice given to you by your health care provider. Make sure you discuss any questions you have with your health  care provider.   Document Released: 07/23/2000 Document Revised: 08/16/2014 Document Reviewed: 03/08/2012 Elsevier Interactive Patient Education 2016 Elsevier Inc.  Hip Pain Your hip is the joint between your upper legs and your lower pelvis. The bones, cartilage, tendons, and muscles of your hip joint perform a lot of work each day supporting your body weight and allowing you to move around. Hip pain can range from a minor ache to severe pain in one or both of your hips. Pain may be felt on the inside of the hip joint near the groin, or the outside near the buttocks and upper thigh. You may have swelling or stiffness as well.  HOME CARE INSTRUCTIONS   Take medicines only as directed by your health care provider.  Apply ice to the injured  area:  Put ice in a plastic bag.  Place a towel between your skin and the bag.  Leave the ice on for 15-20 minutes at a time, 3-4 times a day.  Keep your leg raised (elevated) when possible to lessen swelling.  Avoid activities that cause pain.  Follow specific exercises as directed by your health care provider.  Sleep with a pillow between your legs on your most comfortable side.  Record how often you have hip pain, the location of the pain, and what it feels like. SEEK MEDICAL CARE IF:   You are unable to put weight on your leg.  Your hip is red or swollen or very tender to touch.  Your pain or swelling continues or worsens after 1 week.  You have increasing difficulty walking.  You have a fever. SEEK IMMEDIATE MEDICAL CARE IF:   You have fallen.  You have a sudden increase in pain and swelling in your hip. MAKE SURE YOU:   Understand these instructions.  Will watch your condition.  Will get help right away if you are not doing well or get worse.   This information is not intended to replace advice given to you by your health care provider. Make sure you discuss any questions you have with your health care provider.   Document Released: 01/13/2010 Document Revised: 08/16/2014 Document Reviewed: 03/22/2013 Elsevier Interactive Patient Education 2016 [place] Pain Joint pain, which is also called arthralgia, can be caused by many things. Joint pain often goes away when you follow your health care provider's instructions for relieving pain at home. However, joint pain can also be caused by conditions that require further treatment. Common causes of joint pain include:  Bruising in the area of the joint.  Overuse of the joint.  Wear and tear on the joints that occur with aging (osteoarthritis).  Various other forms of arthritis.  A buildup of a crystal form of uric acid in the joint (gout).  Infections of the joint (septic arthritis) or of the  bone (osteomyelitis). Your health care provider may recommend medicine to help with the pain. If your joint pain continues, additional tests may be needed to diagnose your condition. HOME CARE INSTRUCTIONS Watch your condition for any changes. Follow these instructions as directed to lessen the pain that you are feeling.  Take medicines only as directed by your health care provider.  Rest the affected area for as long as your health care provider says that you should. If directed to do so, raise the painful joint above the level of your heart while you are sitting or lying down.  Do not do things that cause or worsen pain.  If directed, apply ice  to the painful area:  Put ice in a plastic bag.  Place a towel between your skin and the bag.  Leave the ice on for 20 minutes, 2-3 times per day.  Wear an elastic bandage, splint, or sling as directed by your health care provider. Loosen the elastic bandage or splint if your fingers or toes become numb and tingle, or if they turn cold and blue.  Begin exercising or stretching the affected area as directed by your health care provider. Ask your health care provider what types of exercise are safe for you.  Keep all follow-up visits as directed by your health care provider. This is important. SEEK MEDICAL CARE IF:  Your pain increases, and medicine does not help.  Your joint pain does not improve within 3 days.  You have increased bruising or swelling.  You have a fever.  You lose 10 lb (4.5 kg) or more without trying. SEEK IMMEDIATE MEDICAL CARE IF:  You are not able to move the joint.  Your fingers or toes become numb or they turn cold and blue.   This information is not intended to replace advice given to you by your health care provider. Make sure you discuss any questions you have with your health care provider.   Document Released: 07/26/2005 Document Revised: 08/16/2014 Document Reviewed: 05/07/2014 Elsevier Interactive  Patient Education [employer].

## 2016-02-27 NOTE — ED Notes (Signed)
  Reviewed d/c instructions, follow-up care, and prescriptions with pt. Pt verbalized understanding 

## 2016-02-27 NOTE — ED Notes (Signed)
Patient reports had recent chemo treatment and that usually makes his gout flare up.  Patient reports pain all over and states feel like there isn't any circulation in his feet.

## 2016-03-02 ENCOUNTER — Other Ambulatory Visit: Payer: Self-pay | Admitting: *Deleted

## 2016-03-02 DIAGNOSIS — Z510 Encounter for antineoplastic radiation therapy: Secondary | ICD-10-CM | POA: Diagnosis not present

## 2016-03-02 DIAGNOSIS — C3491 Malignant neoplasm of unspecified part of right bronchus or lung: Secondary | ICD-10-CM

## 2016-03-02 MED ORDER — DEXAMETHASONE 4 MG PO TABS: 4.0000 mg | ORAL_TABLET | Freq: Every day | ORAL | 0 refills | Status: DC

## 2016-03-04 ENCOUNTER — Ambulatory Visit
Admission: RE | Admit: 2016-03-04 | Discharge: 2016-03-04 | Disposition: A | Discharge status: Home / Self Care | Payer: Medicaid Other | Source: Ambulatory Visit | Attending: Radiation Oncology | Admitting: Radiation Oncology

## 2016-03-04 ENCOUNTER — Other Ambulatory Visit: Payer: Self-pay | Admitting: *Deleted

## 2016-03-04 DIAGNOSIS — Z510 Encounter for antineoplastic radiation therapy: Secondary | ICD-10-CM | POA: Diagnosis not present

## 2016-03-04 NOTE — Telephone Encounter (Signed)
Called requesting refill on his oxycodone. States his gout is causing him pain and he does not currently have a PCP. He got Oxycodone 10 mg # 60 on 7/14 which should have lasted until 7/28 by Dr Grayland Ormond, in addition, he got Oxycodone 5 mg # 15 on 7/21 from ER. Per Dr Grayland Ormond, he states patient was told with last refill it would be the last one he will give him.

## 2016-03-04 NOTE — Telephone Encounter (Signed)
Returned call to [facility] and informed her that per Dr Grayland Ormond he will not refill his pain medication

## 2016-03-08 ENCOUNTER — Ambulatory Visit
Admission: RE | Admit: 2016-03-08 | Discharge: 2016-03-08 | Disposition: A | Discharge status: Home / Self Care | Payer: Medicaid Other | Source: Ambulatory Visit | Attending: Radiation Oncology | Admitting: Radiation Oncology

## 2016-03-08 DIAGNOSIS — Z510 Encounter for antineoplastic radiation therapy: Secondary | ICD-10-CM | POA: Diagnosis not present

## 2016-03-09 ENCOUNTER — Ambulatory Visit
Admission: RE | Admit: 2016-03-09 | Discharge: 2016-03-09 | Disposition: A | Discharge status: Home / Self Care | Payer: Medicaid Other | Source: Ambulatory Visit | Attending: Radiation Oncology | Admitting: Radiation Oncology

## 2016-03-09 DIAGNOSIS — Z510 Encounter for antineoplastic radiation therapy: Secondary | ICD-10-CM | POA: Diagnosis not present

## 2016-03-11 ENCOUNTER — Ambulatory Visit
Admission: RE | Admit: 2016-03-11 | Discharge: 2016-03-11 | Disposition: A | Discharge status: Home / Self Care | Payer: Medicaid Other | Source: Ambulatory Visit | Attending: Radiation Oncology | Admitting: Radiation Oncology

## 2016-03-11 DIAGNOSIS — Z510 Encounter for antineoplastic radiation therapy: Secondary | ICD-10-CM | POA: Diagnosis not present

## 2016-03-12 ENCOUNTER — Ambulatory Visit
Admission: RE | Admit: 2016-03-12 | Discharge: 2016-03-12 | Disposition: A | Discharge status: Home / Self Care | Payer: Medicaid Other | Source: Ambulatory Visit | Attending: Radiation Oncology | Admitting: Radiation Oncology

## 2016-03-12 DIAGNOSIS — Z510 Encounter for antineoplastic radiation therapy: Secondary | ICD-10-CM | POA: Diagnosis not present

## 2016-03-15 ENCOUNTER — Other Ambulatory Visit: Payer: Self-pay | Admitting: *Deleted

## 2016-03-15 ENCOUNTER — Ambulatory Visit
Admission: RE | Admit: 2016-03-15 | Discharge: 2016-03-15 | Disposition: A | Discharge status: Home / Self Care | Payer: Medicaid Other | Source: Ambulatory Visit | Attending: Radiation Oncology | Admitting: Radiation Oncology

## 2016-03-15 DIAGNOSIS — Z510 Encounter for antineoplastic radiation therapy: Secondary | ICD-10-CM | POA: Diagnosis not present

## 2016-03-15 MED ORDER — DEXAMETHASONE 4 MG PO TABS: 4.0000 mg | ORAL_TABLET | Freq: Two times a day (BID) | ORAL | 0 refills | Status: DC

## 2016-03-16 ENCOUNTER — Inpatient Hospital Stay: Payer: Medicaid Other | Attending: Oncology

## 2016-03-16 ENCOUNTER — Ambulatory Visit
Admission: RE | Admit: 2016-03-16 | Discharge: 2016-03-16 | Disposition: A | Discharge status: Home / Self Care | Payer: Medicaid Other | Source: Ambulatory Visit | Attending: Radiation Oncology | Admitting: Radiation Oncology

## 2016-03-16 DIAGNOSIS — Z9221 Personal history of antineoplastic chemotherapy: Secondary | ICD-10-CM | POA: Insufficient documentation

## 2016-03-16 DIAGNOSIS — D6481 Anemia due to antineoplastic chemotherapy: Secondary | ICD-10-CM | POA: Diagnosis not present

## 2016-03-16 DIAGNOSIS — F17210 Nicotine dependence, cigarettes, uncomplicated: Secondary | ICD-10-CM | POA: Diagnosis not present

## 2016-03-16 DIAGNOSIS — Z85820 Personal history of malignant melanoma of skin: Secondary | ICD-10-CM | POA: Insufficient documentation

## 2016-03-16 DIAGNOSIS — Z510 Encounter for antineoplastic radiation therapy: Secondary | ICD-10-CM | POA: Diagnosis not present

## 2016-03-16 DIAGNOSIS — C3411 Malignant neoplasm of upper lobe, right bronchus or lung: Secondary | ICD-10-CM | POA: Diagnosis present

## 2016-03-16 DIAGNOSIS — Z79899 Other long term (current) drug therapy: Secondary | ICD-10-CM | POA: Insufficient documentation

## 2016-03-16 DIAGNOSIS — C3491 Malignant neoplasm of unspecified part of right bronchus or lung: Secondary | ICD-10-CM

## 2016-03-16 LAB — CBC
HEMATOCRIT: 36.9 % — AB (ref 40.0–52.0)
Hemoglobin: 12.6 g/dL — ABNORMAL LOW (ref 13.0–18.0)
MCH: 30.7 pg (ref 26.0–34.0)
MCHC: 34.1 g/dL (ref 32.0–36.0)
MCV: 90.1 fL (ref 80.0–100.0)
PLATELETS: 203 10*3/uL (ref 150–440)
RBC: 4.09 MIL/uL — ABNORMAL LOW (ref 4.40–5.90)
RDW: 14.9 % — AB (ref 11.5–14.5)
WBC: 9.4 10*3/uL (ref 3.8–10.6)

## 2016-03-17 ENCOUNTER — Ambulatory Visit
Admission: RE | Admit: 2016-03-17 | Discharge: 2016-03-17 | Disposition: A | Discharge status: Home / Self Care | Payer: Medicaid Other | Source: Ambulatory Visit | Attending: Radiation Oncology | Admitting: Radiation Oncology

## 2016-03-17 DIAGNOSIS — Z510 Encounter for antineoplastic radiation therapy: Secondary | ICD-10-CM | POA: Diagnosis not present

## 2016-03-18 ENCOUNTER — Ambulatory Visit
Admission: RE | Admit: 2016-03-18 | Discharge: 2016-03-18 | Disposition: A | Discharge status: Home / Self Care | Payer: Medicaid Other | Source: Ambulatory Visit | Attending: Radiation Oncology | Admitting: Radiation Oncology

## 2016-03-18 ENCOUNTER — Telehealth: Payer: Self-pay | Admitting: *Deleted

## 2016-03-18 DIAGNOSIS — Z510 Encounter for antineoplastic radiation therapy: Secondary | ICD-10-CM | POA: Diagnosis not present

## 2016-03-18 NOTE — Telephone Encounter (Signed)
Patient's significant other called to request pain medication. Request denied secondary documentation. Patient to advised to follow up with PCP

## 2016-03-19 ENCOUNTER — Ambulatory Visit
Admission: RE | Admit: 2016-03-19 | Discharge: 2016-03-19 | Disposition: A | Discharge status: Home / Self Care | Payer: Medicaid Other | Source: Ambulatory Visit | Attending: Radiation Oncology | Admitting: Radiation Oncology

## 2016-03-19 DIAGNOSIS — Z510 Encounter for antineoplastic radiation therapy: Secondary | ICD-10-CM | POA: Diagnosis not present

## 2016-03-22 ENCOUNTER — Ambulatory Visit
Admission: RE | Admit: 2016-03-22 | Discharge: 2016-03-22 | Disposition: A | Discharge status: Home / Self Care | Payer: Medicaid Other | Source: Ambulatory Visit | Attending: Radiation Oncology | Admitting: Radiation Oncology

## 2016-03-22 DIAGNOSIS — Z510 Encounter for antineoplastic radiation therapy: Secondary | ICD-10-CM | POA: Diagnosis not present

## 2016-03-23 ENCOUNTER — Telehealth: Payer: Self-pay | Admitting: *Deleted

## 2016-03-23 ENCOUNTER — Ambulatory Visit
Admission: RE | Admit: 2016-03-23 | Discharge: 2016-03-23 | Disposition: A | Discharge status: Home / Self Care | Payer: Medicaid Other | Source: Ambulatory Visit | Attending: Radiation Oncology | Admitting: Radiation Oncology

## 2016-03-23 ENCOUNTER — Encounter (INDEPENDENT_AMBULATORY_CARE_PROVIDER_SITE_OTHER): Payer: Self-pay

## 2016-03-23 ENCOUNTER — Inpatient Hospital Stay: Payer: Medicaid Other

## 2016-03-23 DIAGNOSIS — Z510 Encounter for antineoplastic radiation therapy: Secondary | ICD-10-CM | POA: Diagnosis not present

## 2016-03-23 NOTE — Telephone Encounter (Signed)
Called asking for pain med refill stating he has been hurting for a month since Dr Grayland Ormond has not refilled his med. I advised her that the last prescription he got he was told that we would not be refilling any more pain meds. She stated, "oh, I didn't know that" and then asked what he is to do. I advised her to contact his PCP. She stated she would do that

## 2016-03-24 ENCOUNTER — Ambulatory Visit
Admission: RE | Admit: 2016-03-24 | Discharge: 2016-03-24 | Disposition: A | Discharge status: Home / Self Care | Payer: Medicaid Other | Source: Ambulatory Visit | Attending: Radiation Oncology | Admitting: Radiation Oncology

## 2016-03-24 DIAGNOSIS — Z510 Encounter for antineoplastic radiation therapy: Secondary | ICD-10-CM | POA: Diagnosis not present

## 2016-03-25 ENCOUNTER — Ambulatory Visit
Admission: RE | Admit: 2016-03-25 | Discharge: 2016-03-25 | Disposition: A | Discharge status: Home / Self Care | Payer: Medicaid Other | Source: Ambulatory Visit | Attending: Radiation Oncology | Admitting: Radiation Oncology

## 2016-03-25 DIAGNOSIS — Z510 Encounter for antineoplastic radiation therapy: Secondary | ICD-10-CM | POA: Diagnosis not present

## 2016-03-26 ENCOUNTER — Ambulatory Visit
Admission: RE | Admit: 2016-03-26 | Discharge: 2016-03-26 | Disposition: A | Discharge status: Home / Self Care | Payer: Medicaid Other | Source: Ambulatory Visit | Attending: Radiation Oncology | Admitting: Radiation Oncology

## 2016-03-26 DIAGNOSIS — Z510 Encounter for antineoplastic radiation therapy: Secondary | ICD-10-CM | POA: Diagnosis not present

## 2016-03-29 ENCOUNTER — Ambulatory Visit
Admission: RE | Admit: 2016-03-29 | Discharge: 2016-03-29 | Disposition: A | Discharge status: Home / Self Care | Payer: Medicaid Other | Source: Ambulatory Visit | Attending: Radiation Oncology | Admitting: Radiation Oncology

## 2016-03-29 DIAGNOSIS — Z510 Encounter for antineoplastic radiation therapy: Secondary | ICD-10-CM | POA: Diagnosis not present

## 2016-03-30 ENCOUNTER — Ambulatory Visit
Admission: RE | Admit: 2016-03-30 | Discharge: 2016-03-30 | Disposition: A | Discharge status: Home / Self Care | Payer: Medicaid Other | Source: Ambulatory Visit | Attending: Radiation Oncology | Admitting: Radiation Oncology

## 2016-03-30 ENCOUNTER — Inpatient Hospital Stay: Payer: Medicaid Other

## 2016-03-30 DIAGNOSIS — C3411 Malignant neoplasm of upper lobe, right bronchus or lung: Secondary | ICD-10-CM | POA: Diagnosis not present

## 2016-03-30 DIAGNOSIS — C3491 Malignant neoplasm of unspecified part of right bronchus or lung: Secondary | ICD-10-CM

## 2016-03-30 DIAGNOSIS — Z510 Encounter for antineoplastic radiation therapy: Secondary | ICD-10-CM | POA: Diagnosis not present

## 2016-03-30 DIAGNOSIS — Z95828 Presence of other vascular implants and grafts: Secondary | ICD-10-CM

## 2016-03-30 LAB — CBC
HCT: 39.1 % — ABNORMAL LOW (ref 40.0–52.0)
Hemoglobin: 13.6 g/dL (ref 13.0–18.0)
MCH: 30.6 pg (ref 26.0–34.0)
MCHC: 34.8 g/dL (ref 32.0–36.0)
MCV: 88 fL (ref 80.0–100.0)
Platelets: 177 10*3/uL (ref 150–440)
RBC: 4.44 MIL/uL (ref 4.40–5.90)
RDW: 15.2 % — ABNORMAL HIGH (ref 11.5–14.5)
WBC: 5.9 10*3/uL (ref 3.8–10.6)

## 2016-03-30 MED ORDER — HEPARIN SOD (PORK) LOCK FLUSH 100 UNIT/ML IV SOLN
500.0000 [IU] | Freq: Once | INTRAVENOUS | Status: AC
  Administered 2016-03-30: 500 [IU] via INTRAVENOUS

## 2016-03-30 MED ORDER — SODIUM CHLORIDE 0.9% FLUSH
10.0000 mL | INTRAVENOUS | Status: DC | PRN
  Administered 2016-03-30: 10 mL via INTRAVENOUS
  Filled 2016-03-30: qty 10

## 2016-03-30 NOTE — Progress Notes (Signed)
Survivorship Care Plan visit completed.  Treatment summary reviewed and given to patient.  ASCO answers booklet reviewed and given to patient.  CARE program and Cancer Transitions discussed with patient along with other resources cancer center offers to patients and caregivers.  Patient verbalized understanding.    

## 2016-03-31 ENCOUNTER — Ambulatory Visit
Admission: RE | Admit: 2016-03-31 | Discharge: 2016-03-31 | Disposition: A | Discharge status: Home / Self Care | Payer: Medicaid Other | Source: Ambulatory Visit | Attending: Radiation Oncology | Admitting: Radiation Oncology

## 2016-03-31 DIAGNOSIS — Z510 Encounter for antineoplastic radiation therapy: Secondary | ICD-10-CM | POA: Diagnosis not present

## 2016-04-04 ENCOUNTER — Encounter: Payer: Self-pay | Admitting: Emergency Medicine

## 2016-04-04 ENCOUNTER — Emergency Department: Payer: Medicaid Other

## 2016-04-04 ENCOUNTER — Emergency Department
Admission: EM | Admit: 2016-04-04 | Discharge: 2016-04-04 | Disposition: A | Discharge status: Home / Self Care | Payer: Medicaid Other | Attending: Emergency Medicine | Admitting: Emergency Medicine

## 2016-04-04 DIAGNOSIS — R0600 Dyspnea, unspecified: Secondary | ICD-10-CM | POA: Diagnosis not present

## 2016-04-04 DIAGNOSIS — R51 Headache: Secondary | ICD-10-CM | POA: Diagnosis present

## 2016-04-04 DIAGNOSIS — E860 Dehydration: Secondary | ICD-10-CM | POA: Diagnosis not present

## 2016-04-04 DIAGNOSIS — F17210 Nicotine dependence, cigarettes, uncomplicated: Secondary | ICD-10-CM | POA: Insufficient documentation

## 2016-04-04 DIAGNOSIS — R519 Headache, unspecified: Secondary | ICD-10-CM

## 2016-04-04 DIAGNOSIS — J449 Chronic obstructive pulmonary disease, unspecified: Secondary | ICD-10-CM | POA: Insufficient documentation

## 2016-04-04 DIAGNOSIS — I10 Essential (primary) hypertension: Secondary | ICD-10-CM | POA: Diagnosis not present

## 2016-04-04 DIAGNOSIS — Z79899 Other long term (current) drug therapy: Secondary | ICD-10-CM | POA: Diagnosis not present

## 2016-04-04 DIAGNOSIS — J45909 Unspecified asthma, uncomplicated: Secondary | ICD-10-CM | POA: Diagnosis not present

## 2016-04-04 DIAGNOSIS — Z85118 Personal history of other malignant neoplasm of bronchus and lung: Secondary | ICD-10-CM | POA: Insufficient documentation

## 2016-04-04 HISTORY — DX: Essential (primary) hypertension: I10

## 2016-04-04 HISTORY — DX: Unspecified asthma, uncomplicated: J45.909

## 2016-04-04 LAB — COMPREHENSIVE METABOLIC PANEL
ALK PHOS: 55 U/L (ref 38–126)
ALT: 13 U/L — AB (ref 17–63)
AST: 18 U/L (ref 15–41)
Albumin: 3.7 g/dL (ref 3.5–5.0)
Anion gap: 8 (ref 5–15)
BILIRUBIN TOTAL: 0.7 mg/dL (ref 0.3–1.2)
BUN: 15 mg/dL (ref 6–20)
CALCIUM: 9.1 mg/dL (ref 8.9–10.3)
CHLORIDE: 98 mmol/L — AB (ref 101–111)
CO2: 26 mmol/L (ref 22–32)
CREATININE: 1.17 mg/dL (ref 0.61–1.24)
Glucose, Bld: 120 mg/dL — ABNORMAL HIGH (ref 65–99)
Potassium: 3.8 mmol/L (ref 3.5–5.1)
Sodium: 132 mmol/L — ABNORMAL LOW (ref 135–145)
TOTAL PROTEIN: 7.1 g/dL (ref 6.5–8.1)

## 2016-04-04 LAB — CBC
HCT: 37 % — ABNORMAL LOW (ref 40.0–52.0)
Hemoglobin: 13 g/dL (ref 13.0–18.0)
MCH: 30.7 pg (ref 26.0–34.0)
MCHC: 35.1 g/dL (ref 32.0–36.0)
MCV: 87.6 fL (ref 80.0–100.0)
PLATELETS: 183 10*3/uL (ref 150–440)
RBC: 4.23 MIL/uL — AB (ref 4.40–5.90)
RDW: 14.8 % — AB (ref 11.5–14.5)
WBC: 6.3 10*3/uL (ref 3.8–10.6)

## 2016-04-04 LAB — LIPASE, BLOOD: LIPASE: 33 U/L (ref 11–51)

## 2016-04-04 LAB — TROPONIN I

## 2016-04-04 MED ORDER — IOPAMIDOL (ISOVUE-370) INJECTION 76%
75.0000 mL | Freq: Once | INTRAVENOUS | Status: AC | PRN
  Administered 2016-04-04: 75 mL via INTRAVENOUS

## 2016-04-04 MED ORDER — ONDANSETRON HCL 4 MG/2ML IJ SOLN: 4.0000 mg | Freq: Once | INTRAMUSCULAR | Status: DC

## 2016-04-04 MED ORDER — ALBUTEROL SULFATE (2.5 MG/3ML) 0.083% IN NEBU
5.0000 mg | INHALATION_SOLUTION | Freq: Once | RESPIRATORY_TRACT | Status: AC
  Administered 2016-04-04: 5 mg via RESPIRATORY_TRACT
  Filled 2016-04-04: qty 6

## 2016-04-04 MED ORDER — SODIUM CHLORIDE 0.9 % IV BOLUS (SEPSIS)
1000.0000 mL | Freq: Once | INTRAVENOUS | Status: AC
  Administered 2016-04-04: 1000 mL via INTRAVENOUS

## 2016-04-04 MED ORDER — SODIUM CHLORIDE 0.9 % IV BOLUS (SEPSIS): 1000.0000 mL | Freq: Once | INTRAVENOUS | Status: DC

## 2016-04-04 MED ORDER — ONDANSETRON 4 MG PO TBDP: 4.0000 mg | ORAL_TABLET | Freq: Three times a day (TID) | ORAL | 0 refills | Status: DC | PRN

## 2016-04-04 MED ORDER — KETOROLAC TROMETHAMINE 30 MG/ML IJ SOLN
15.0000 mg | INTRAMUSCULAR | Status: AC
  Administered 2016-04-04: 15 mg via INTRAVENOUS
  Filled 2016-04-04: qty 1

## 2016-04-04 MED ORDER — ONDANSETRON HCL 4 MG/2ML IJ SOLN
4.0000 mg | Freq: Once | INTRAMUSCULAR | Status: AC
  Administered 2016-04-04: 4 mg via INTRAVENOUS
  Filled 2016-04-04: qty 2

## 2016-04-04 MED ORDER — HYDROMORPHONE HCL 1 MG/ML IJ SOLN: 1.0000 mg | Freq: Once | INTRAMUSCULAR | Status: DC

## 2016-04-04 MED ORDER — OXYCODONE HCL 5 MG PO TABS: 5.0000 mg | ORAL_TABLET | Freq: Four times a day (QID) | ORAL | 0 refills | Status: DC | PRN

## 2016-04-04 NOTE — ED Triage Notes (Signed)
Pt presents to triage SOB and in lung pain and back pain from his radiation tx this past Wednesday.  Pt also has a headache he cannot get rid of.  Pt is bent over in triage from nausea and SOB.

## 2016-04-04 NOTE — ED Notes (Signed)
MD Joni Fears at bedside.

## 2016-04-04 NOTE — ED Notes (Signed)
Patient transported to CT 

## 2016-04-04 NOTE — Discharge Instructions (Signed)
Pain Medicine Instructions HOW CAN PAIN MEDICINE AFFECT ME? You were given a prescription for pain medicine. This medicine may make you tired or drowsy and may affect your ability to think clearly. Pain medicine may also affect your ability to drive or perform certain physical activities. It may not be possible to make all of your pain go away, but you should be comfortable enough to move, breathe, and take care of yourself. HOW OFTEN SHOULD I TAKE PAIN MEDICINE AND HOW MUCH SHOULD I TAKE? Take pain medicine only as directed by your health care provider and only as needed for pain. You do not need to take pain medicine if you are not having pain, unless directed by your health care provider. You can take less than the prescribed dose if you find that a smaller amount of medicine controls your pain. WHAT RESTRICTIONS DO I HAVE WHILE TAKING PAIN MEDICINE? Follow these instructions after you start taking pain medicine, while you are taking the medicine, and for 8 hours after you stop taking the medicine: Do not drive. Do not operate machinery. Do not operate power tools. Do not sign legal documents. Do not drink alcohol. Do not take sleeping pills. Do not supervise children by yourself. Do not participate in activities that require climbing or being in high places. Do not enter a body of water--such as a lake, river, ocean, spa, or swimming pool--without an adult nearby who can monitor and help you. HOW CAN I KEEP OTHERS SAFE WHILE I AM TAKING PAIN MEDICINE? Store your pain medicine as directed by your health care provider. Make sure that it is placed where children and pets cannot reach it. Never share your pain medicine with anyone. Do not save any leftover pills. If you have any leftover pain medicine, get rid of it or destroy it as directed by your health care provider. WHAT ELSE DO I NEED TO KNOW ABOUT TAKING PAIN MEDICINE? Use a stool softener if you become constipated from your pain  medicine. Increasing your intake of fruits and vegetables will also help with constipation. Write down the times when you take your pain medicine. Look at the times before you take your next dose of medicine. It is easy to become confused while on pain medicine. Recording the times helps you to avoid an overdose. If your pain is severe, do not try to treat it yourself by taking more pills than instructed on your prescription. Contact your health care provider for help. You may have been prescribed a pain medicine that contains acetaminophen. Do not take any other acetaminophen while taking this medicine. An overdose of acetaminophen can result in severe liver damage. Acetaminophen is found in many over-the-counter (OTC) and prescription medicines. If you are taking any medicines in addition to your pain medicine, check the active ingredients on those medicines to see if acetaminophen is listed. WHEN SHOULD I CALL MY HEALTH CARE PROVIDER? Your medicine is not helping to make the pain go away. You vomit or have diarrhea shortly after taking the medicine. You develop new pain in areas that did not hurt before. You have an allergic reaction to your medicine. This may include: Itchiness. Swelling. Dizziness. Developing a new rash. WHEN SHOULD I CALL 911 OR GO TO THE EMERGENCY ROOM? You feel dizzy or you faint. You are very confused or disoriented. You repeatedly vomit. Your skin or lips turn pale or bluish in color. You have shortness of breath or you are breathing much more slowly than usual. You have  a severe allergic reaction to your medicine. This includes: Developing tongue swelling. Having difficulty breathing.   This information is not intended to replace advice given to you by your health care provider. Make sure you discuss any questions you have with your health care provider.   Document Released: 11/01/2000 Document Revised: 12/10/2014 Document Reviewed: 05/30/2014 Elsevier  Interactive Patient Education [employer].

## 2016-04-04 NOTE — ED Notes (Signed)
Pt has hx of lung cancer and melanoma, currently receiving radiation for lung cancer, last treatment on Wednesday. Pt c/o N/V, upper right back pain, and SOB.

## 2016-04-04 NOTE — ED Provider Notes (Signed)
[facility] Emergency Department Provider Note  ____________________________________________  Time seen: Approximately 5:51 AM  I have reviewed the triage vital signs and the nursing notes.   HISTORY  Chief Complaint Nausea; Shortness of Breath; and Generalized Pain    HPI David [name] is a 60 y.o. 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 Also history of melanoma. In addition to the headache, he also complains of chest pain in the right upper back which is sharp and pleuritic. Also complains of shortness of breath. No cough. No fevers or chills. No exertional symptoms.     Past Medical History:  Diagnosis Date  . Asthma   . COPD (chronic obstructive pulmonary disease) (HCC)    not on home o2  . History of melanoma   . Hypertension   . Small cell lung cancer ([place])   . Tobacco use      Patient Active Problem List   Diagnosis Date Noted  . Sepsis ([place]) 12/31/2015  . 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 PERIPHERAL VASCULAR CATHETERIZATION N/A 09/15/2015   Procedure: Glori Luis Cath Insertion;  Surgeon: Algernon Huxley, MD;  Location: [facility];  Service: Cardiovascular;  Laterality: N/A;  . SKIN GRAFT     [facility]     Prior to Admission medications   Medication Sig Start Date End Date Taking? Authorizing Provider  albuterol (PROVENTIL HFA;VENTOLIN HFA) 108 (90 Base) MCG/ACT inhaler Inhale 2 puffs into the lungs every 6 (six) hours as needed for wheezing or shortness of breath. 12/08/15   Lloyd Huger, MD  colchicine 0.6 MG tablet Take 1 tablet (0.6 mg total) by mouth daily. 02/09/16   Lloyd Huger, MD  dexamethasone (DECADRON) 4 MG tablet Take 1 tablet (4 mg total) by mouth 2 (two) times daily with a meal.  03/15/16   Noreene Filbert, MD  ondansetron (ZOFRAN ODT) 4 MG disintegrating tablet Take 1 tablet (4 mg total) by mouth every 8 (eight) hours as needed for nausea or vomiting. 04/04/16   Carrie Mew, MD  ondansetron (ZOFRAN) 8 MG tablet Take 1 tablet (8 mg total) by mouth 2 (two) times daily as needed. Start on the third day after cisplatin chemotherapy. 09/15/15   Lloyd Huger, MD  oxyCODONE (ROXICODONE) 5 MG immediate release tablet Take 1 tablet (5 mg total) by mouth every 6 (six) hours as needed for breakthrough pain. 04/04/16   Carrie Mew, MD  predniSONE (DELTASONE) 10 MG tablet Take 1 tablet (10 mg total) by mouth daily. 6,5,4,3,2,1 six day taper 02/27/16   Duanne Guess, PA-C  prochlorperazine (COMPAZINE) 10 MG tablet Take 1 tablet (10 mg total) by mouth every 6 (six) hours as needed (Nausea or vomiting). 09/15/15   Lloyd Huger, MD     Allergies Tylenol [acetaminophen] and Vicodin [hydrocodone-acetaminophen]   Family History  Problem Relation Age of Onset  . Lung cancer Mother   . Cirrhosis Father     Social History Social History  Substance Use Topics  . Smoking status: Current Every Day Smoker    Packs/day: 1.00    Years: 51.00    Types: Cigarettes    Start date: 08/12/1994  . Smokeless tobacco: Never Used     Comment: used to smoke  upto 2PPD, recently cut down  . Alcohol use No    Review of Systems  Constitutional:   No fever Positive chills.  ENT:   No sore throat. No rhinorrhea. Cardiovascular:   Positive posterior chest pain. Respiratory:   Positive shortness of breath without cough. Gastrointestinal:   Negative for abdominal pain, vomiting and diarrhea.  Genitourinary:   Negative for dysuria or difficulty urinating. Musculoskeletal:   Negative for focal pain or swelling Neurological:   Positive as above for headaches 10-point ROS otherwise negative.  ____________________________________________   PHYSICAL EXAM:  VITAL SIGNS: ED Triage  Vitals  Enc Vitals Group     BP 04/04/16 0322 124/71     Pulse Rate 04/04/16 0322 88     Resp 04/04/16 0322 20     Temp 04/04/16 0322 98.9 F (37.2 C)     Temp Source 04/04/16 0322 Oral     SpO2 04/04/16 0322 98 %     Weight --      Height --      Head Circumference --      Peak Flow --      Pain Score 04/04/16 0323 8     Pain Loc --      Pain Edu? --      Excl. in [place]? --     Vital signs reviewed, nursing assessments reviewed.   Constitutional:   Alert and oriented. Well appearing and in no distress. Eyes:   No scleral icterus. No conjunctival pallor. PERRL. EOMI.  No nystagmus. 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   Nose:   No congestion/rhinnorhea. No septal hematoma   Mouth/Throat:   Dry mucous membranes, no pharyngeal erythema. No peritonsillar mass.    Neck:   No stridor. No SubQ emphysema. No meningismus. Hematological/Lymphatic/Immunilogical:   No cervical lymphadenopathy. Cardiovascular:   RRR. Symmetric bilateral radial and DP pulses.  No murmurs.  Respiratory:   Tachypnea. Bronchial breath sounds on the left. No wheezing. Gastrointestinal:   Soft and nontender. Non distended. There is no CVA tenderness.  No rebound, rigidity, or guarding. Genitourinary:   deferred Musculoskeletal:   Nontender with normal range of motion in all extremities. No joint effusions.  No lower extremity tenderness.  No edema. Neurologic:   Normal speech and language.  CN 2-10 normal. Motor grossly intact. No gross focal neurologic deficits are appreciated.  Skin:    Skin is warm, dry and intact. No rash noted.  No petechiae, purpura, or bullae.  ____________________________________________    LABS (pertinent positives/negatives) (all labs ordered are listed, but only abnormal results are displayed) Labs Reviewed  COMPREHENSIVE METABOLIC PANEL - Abnormal; Notable for the following:       Result Value    Sodium 132 (*)    Chloride 98 (*)    Glucose, Bld 120 (*)    ALT 13 (*)    All other components within normal limits  CBC - Abnormal; Notable for the following:    RBC 4.23 (*)    HCT 37.0 (*)    RDW 14.8 (*)    All other components within normal limits  LIPASE, BLOOD  TROPONIN I  URINALYSIS COMPLETEWITH MICROSCOPIC ([facility] ONLY)   ____________________________________________   EKG  Interpreted by me  Date: 04/04/2016  Rate: 89  Rhythm: normal sinus rhythm  QRS Axis: normal  Intervals: normal  ST/T Wave abnormalities: normal  Conduction Disutrbances: none  Narrative Interpretation: unremarkable  ____________________________________________    RADIOLOGY  Chest x-ray unremarkable  ____________________________________________   PROCEDURES Procedures  ____________________________________________   INITIAL IMPRESSION / ASSESSMENT AND PLAN / ED COURSE  Pertinent labs & imaging results that were available during my care of the patient were reviewed by me and considered in my medical decision making (see chart for details).  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 Labs unremarkable chest x-ray EKG unremarkable. We'll get CT head and CT angiogram of the chest. Toradol for pain, IV fluids.     Clinical Course  Comment By Time  CT head and CT angiogram chest unremarkable, no acute findings. Patient feels better. Heart rate improved. Patient states headache has resolved. Response to treatment suggests that he was mildly dehydrated which is been fixed by IV fluids. We'll have him follow-up with cancer Center in one or 2 days. Carrie Mew, MD 08/27 [phone]   ____________________________________________   FINAL CLINICAL IMPRESSION(S) / ED DIAGNOSES  Final diagnoses:  Acute nonintractable headache, unspecified headache type  Dyspnea  Mild  dehydration       Portions of this note were generated with dragon dictation software. Dictation errors may occur despite best attempts at proofreading.    Carrie Mew, MD 04/04/16 [phone]

## 2016-04-14 ENCOUNTER — Ambulatory Visit: Payer: Medicaid Other | Admitting: Family Medicine

## 2016-04-15 ENCOUNTER — Ambulatory Visit: Payer: Medicaid Other | Admitting: Radiation Oncology

## 2016-04-20 ENCOUNTER — Other Ambulatory Visit: Payer: Self-pay | Admitting: Family Medicine

## 2016-04-20 ENCOUNTER — Encounter: Payer: Self-pay | Admitting: *Deleted

## 2016-04-20 ENCOUNTER — Ambulatory Visit (INDEPENDENT_AMBULATORY_CARE_PROVIDER_SITE_OTHER): Payer: Medicaid Other | Admitting: Family Medicine

## 2016-04-20 VITALS — BP 111/69 | HR 87 | Temp 98.8°F | Resp 16 | Ht 69.0 in | Wt 125.2 lb

## 2016-04-20 DIAGNOSIS — C3491 Malignant neoplasm of unspecified part of right bronchus or lung: Secondary | ICD-10-CM | POA: Diagnosis not present

## 2016-04-20 DIAGNOSIS — Z85820 Personal history of malignant melanoma of skin: Secondary | ICD-10-CM

## 2016-04-20 DIAGNOSIS — H547 Unspecified visual loss: Secondary | ICD-10-CM

## 2016-04-20 DIAGNOSIS — Z720 Tobacco use: Secondary | ICD-10-CM | POA: Diagnosis not present

## 2016-04-20 DIAGNOSIS — F1190 Opioid use, unspecified, uncomplicated: Secondary | ICD-10-CM | POA: Insufficient documentation

## 2016-04-20 DIAGNOSIS — G893 Neoplasm related pain (acute) (chronic): Secondary | ICD-10-CM | POA: Diagnosis not present

## 2016-04-20 DIAGNOSIS — M1A09X0 Idiopathic chronic gout, multiple sites, without tophus (tophi): Secondary | ICD-10-CM | POA: Diagnosis not present

## 2016-04-20 DIAGNOSIS — G4700 Insomnia, unspecified: Secondary | ICD-10-CM | POA: Insufficient documentation

## 2016-04-20 DIAGNOSIS — F4321 Adjustment disorder with depressed mood: Secondary | ICD-10-CM

## 2016-04-20 DIAGNOSIS — J432 Centrilobular emphysema: Secondary | ICD-10-CM | POA: Diagnosis not present

## 2016-04-20 DIAGNOSIS — M109 Gout, unspecified: Secondary | ICD-10-CM | POA: Insufficient documentation

## 2016-04-20 DIAGNOSIS — J449 Chronic obstructive pulmonary disease, unspecified: Secondary | ICD-10-CM | POA: Insufficient documentation

## 2016-04-20 DIAGNOSIS — F4320 Adjustment disorder, unspecified: Secondary | ICD-10-CM | POA: Insufficient documentation

## 2016-04-20 MED ORDER — ALBUTEROL SULFATE HFA 108 (90 BASE) MCG/ACT IN AERS: 2.0000 | INHALATION_SPRAY | Freq: Four times a day (QID) | RESPIRATORY_TRACT | 2 refills | Status: DC | PRN

## 2016-04-20 MED ORDER — TRAMADOL HCL 50 MG PO TABS: 50.0000 mg | ORAL_TABLET | Freq: Two times a day (BID) | ORAL | 0 refills | Status: DC

## 2016-04-20 MED ORDER — COLCHICINE 0.6 MG PO TABS: 0.6000 mg | ORAL_TABLET | Freq: Every day | ORAL | 2 refills | Status: DC | PRN

## 2016-04-20 NOTE — Patient Instructions (Signed)
Thank you for coming in to clinic today.  1. For your Pain - We will start Tramadol '50mg'$  tablets, take 1 twice a day for pain as needed. You may take 2 pills at night instead, if this helps pain at bedtime more. This is a 1 month prescription. We will need to get a Urine test on you either today or within 1 week. If this test is normal, then we can continue this prescription for up to 3 months until you get into Pain Clinic - Referral to Pain Clinic - you should be contacted with an appointment or by their office within next few weeks to month. Call us if you have not heard back by 3 weeks  Recommend to start taking Tylenol Extra Strength '500mg'$  tabs - take 1 to 2 tabs (max '1000mg'$  per dose) every 6 hours for pain (take regularly, don't skip a dose for next 3-7 days), max 24 hour daily dose is 6 to 8 tablets or 3000 to '4000mg'$   If you need to take an anti-inflammatory like Ibuprofen or Aleve - you may choose one and take for up to 1 week at a time, follow instructions on bottle. Call with questions, otherwise do not use this regularly. Prefer Tylenol.  Recommend heating pad or ice packs as well for back pain.  Future discuss other medications to help with pain / sleep.  Recommend to try to quit and cutback on smoking, let me know if you need further assistance with treatment.  Refilled Colchicine for gout, take if you develop a flare within 72 hours, may increase to twice a day for up to 1 week or until it resolves  Follow-up with Oncology doctors.  If unable to urinate today, you will need to schedule a Lab Only visit to come in to the office within 1 week from today to provide urine sample.  Please schedule a follow-up appointment with Dr. Parks Ranger in 4 weeks for Chronic Pain / Sleep.  If you have any other questions or concerns, please feel free to call the clinic or send a message through [place]. You may also schedule an earlier appointment if necessary.  Nobie Putnam,  DO [place]

## 2016-04-20 NOTE — Assessment & Plan Note (Signed)
Active smoker, up to 2ppd, chronic problem. Not ready to quit. May consider trial NRT patches

## 2016-04-20 NOTE — Progress Notes (Signed)
Subjective:    Patient ID: David [name], male    DOB: 1955/08/18, 60 y.o.   MRN: [id number]  David [name] is a 60 y.o. male presenting on 04/20/2016 for Establish Care  Previously established PCP in [place], last seen few months ago, now relocated to [facility] due to distance to home.  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 See [facility] Oncology records for full details, followed by Dr Grayland Ormond (Oncology), and Radiation Oncology. He completed chemotherapy earlier this year, and recently had been treated with radiation in 03/2016, will follow-up with rad onc in 2 weeks. - Has port in place, left upper chest, admits to prior history months ago of infection since treated, otherwise still functioning, and states that plans were to remove it in 2018 - Admits occasional nausea, improved on Zofran, admits weight loss, despite decent to good appetite and PO intake  Chronic Pain: - See above Cancer history, he reports pain mostly in low / mid back, also some "lung pain" seemed to be onset similar to dx of cancer within the past year. He describes pain usually onset at night worse, severity 8/10, aching, intermittent during the day less severe, laying on back makes pain worse, deep breath worse pain. - No prior chronic pain or back pain, injury, fracture - Not taking NSAIDs or Tylenol, he was advised not to take them at some point, but cannot explain why - Previously treated by Oncology with Oxycodone up to 5-'10mg'$  BID with relief of pain, however given resolution of chemotherapy treatments, this was discontinued by Oncology in 02/2016. Last rx given by ED on 04/04/16, for 3 day supply, states he is currently out of opiate medication, last took Percocet about 12 days ago. -  Admits difficulty sleeping at night due to pain primarily - States prior referral to [facility] Management, however he has not heard back yet - Requesting some medication for pain  H/o Gout, Chronic recurrent, without flare: - Reports chronic problem with intermittent flares few times a year in various joints, mostly bilateral elbows, knees, and ankles. Previously treated with Colchicine with some improvement PRN for flare. Request refill today. No other gout medications.  H/o Melanoma - Reports chronic history of skin cancer including melanoma >3 years ago on face and SCC multiple locations, s/p surgical resections/biopsy. Denies other treatments. Previously treated by Dermatologist in [place] and out of state in [place]. Would like referral to establish to closer Dermatologist for skin surveillance and follow-up. 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 Has never fully quit before, has attempted using NRT patches, unsuccessful. He admits that he is not ready to quit smoking and plans to continue smoking.  Health Maintenance: - Due for Flu shot, declines - Declines Hep C / HIV routine screening - Never had colonoscopy, he states was already in process of scheduling this, will hold at this time  Past Medical History:  Diagnosis Date  . Asthma   . COPD (chronic obstructive pulmonary disease) (HCC)    not on home o2  . History of melanoma   . Hypertension   . Small cell lung cancer ([place])   .  Tobacco use    Social History   Social History  . Marital status: Widowed    Spouse name: N/A  . Number of children: N/A  . Years of education: 8th grade   Occupational History  . Retired Theme park manager    Social History Main Topics  . Smoking status: Current Every Day Smoker    Packs/day: 2.00    Years: 51.00    Types: Cigarettes    Start date:  08/12/1994  . Smokeless tobacco: Never Used     Comment: used to smoke upto 2PPD, recently cut down  . Alcohol use No  . Drug use: No  . Sexual activity: Not on file   Other Topics Concern  . Not on file   Social History Narrative   Lives at home with wife   Family History  Problem Relation Age of Onset  . Lung cancer Mother   . Heart attack Mother 99  . Cirrhosis Father 64   Current Outpatient Prescriptions on File Prior to Visit  Medication Sig  . ondansetron (ZOFRAN ODT) 4 MG disintegrating tablet Take 1 tablet (4 mg total) by mouth every 8 (eight) hours as needed for nausea or vomiting.   Current Facility-Administered Medications on File Prior to Visit  Medication  . 0.9 %  sodium chloride infusion    Review of Systems  Constitutional: Positive for fatigue and unexpected weight change (gradual continued weight loss, despite decent appetite). Negative for activity change, appetite change, chills, diaphoresis and fever.  HENT: Negative for congestion, ear pain, facial swelling, hearing loss, sinus pressure, trouble swallowing and voice change.   Eyes: Negative for visual disturbance.  Respiratory: Positive for shortness of breath (none actively, chronic problem). Negative for cough, chest tightness and wheezing.   Cardiovascular: Negative for chest pain, palpitations and leg swelling.  Gastrointestinal: Negative for abdominal pain, constipation, diarrhea, nausea (chronic problem) and vomiting.  Endocrine: Negative for cold intolerance and heat intolerance.  Genitourinary: Negative for dysuria, frequency and hematuria.  Musculoskeletal: Positive for arthralgias and back pain. Negative for myalgias, neck pain and neck stiffness.  Skin: Negative for rash.  Neurological: Negative for dizziness, weakness, light-headedness, numbness and headaches.  Hematological: Negative for adenopathy.  Psychiatric/Behavioral: Positive for dysphoric mood and sleep disturbance (due to pain).  Negative for behavioral problems, confusion, hallucinations, self-injury and suicidal ideas. The patient is not nervous/anxious.    Per HPI unless specifically indicated above     Objective:    BP 111/69 (BP Location: Right Arm, Patient Position: Sitting, Cuff Size: Normal)   Pulse 87   Temp 98.8 F (37.1 C) (Oral)   Resp 16   Ht '5\' 9"'$  (1.753 m)   Wt 125 lb 3.2 oz (56.8 kg)   BMI 18.49 kg/m   Wt Readings from Last 3 Encounters:  04/20/16 125 lb 3.2 oz (56.8 kg)  02/27/16 134 lb (60.8 kg)  02/24/16 131 lb 6.3 oz (59.6 kg)     Physical Exam  Constitutional: He is oriented to person, place, and time. He appears well-developed and well-nourished. No distress.  Chronically ill, thin, and appears older than stated age. Currently comfortable and cooperative.  HENT:  Head: Normocephalic and atraumatic.  Mouth/Throat: Oropharynx is clear and moist.  Eyes: Conjunctivae and EOM are normal. Pupils are equal, round, and reactive to light.  Neck: Normal range of motion. Neck supple. No thyromegaly present.  Cardiovascular: Normal rate, regular rhythm, normal heart sounds and intact distal pulses.   No murmur heard. Pulmonary/Chest: Effort normal and  breath sounds normal. No respiratory distress. He has no wheezes. He has no rales. He exhibits no tenderness.  Abdominal: Soft. Bowel sounds are normal. He exhibits no distension and no mass. There is no tenderness.  Musculoskeletal: He exhibits no edema.  Low Back Inspection: Thin body habitus, no spinal deformity, symmetrical. Palpation: No tenderness over spinous processes. Bilateral lumbar paraspinal muscles mild tender L>R with mild hypertonicity.  ROM: Mildly reduced forward flexion and back extension. Special Testing: Seated SLR negative for radicular pain bilaterally Strength: Bilateral hip flex/ext 5/5, knee flex/ext 5/5, ankle dorsiflex/plantarflex 5/5 Neurovascular: intact distal sensation to light touch  Lymphadenopathy:    He has  no cervical adenopathy.  Neurological: He is alert and oriented to person, place, and time.  Gait slow but stable without assistance  Skin: Skin is warm and dry. No rash noted. He is not diaphoretic.  Psychiatric: He has a normal mood and affect. His behavior is normal. Thought content normal.  Nursing note and vitals reviewed.      Assessment & Plan:   Problem List Items Addressed This Visit    Tobacco abuse    Active smoker, up to 2ppd, chronic problem. Not ready to quit. May consider trial NRT patches      Small cell lung cancer ([place]) - Primary    Followed by Oncology Dr Grayland Ormond and Radiation Oncology      Relevant Medications   colchicine 0.6 MG tablet   traMADol (ULTRAM) 50 MG tablet   Opiate use    Prior history of opiate use regularly during chemotherapy and oncology treatment, now he has been off opiates >2-3 weeks. No clinical diagnosis of dependence. - Will not continue chronic opiate rx for pain, will use tramadol temporarily, obtained UDS and referral to Pain Management      Relevant Orders   Drug Scr Ur, Pain Mgmt, Reflex Conf   Insomnia    Likely secondary to chronic pain, also possibly related to adjustment/mood disorder. - Discussed variety of options - Start with Tramadol for pain, then will begin to try alternative pain medications and consider Remeron vs Cymbalta      History of melanoma    On face, s/p surgical resection. Also h/o SCC. High risk with prior sun exposure. Referral to Dermatology - [facility] to re-establish for chronic skin surveillance and monitoring.      Relevant Orders   Ambulatory referral to Dermatology   Gout    Chronic problem, without acute flare. No evidence of tophi or chronic joint complications Refill Colchicine PRN flare, daily within 72 hr then BID up to 1 week or resolve Future follow-up consider uric acid and other uric acid lowering meds if recurrent flares      Relevant Medications   colchicine 0.6 MG  tablet   COPD (chronic obstructive pulmonary disease) (HCC)    Without exacerbation. Secondary to chronic lifelong tobacco abuse. Previously followed by Pulmonology. Refilled Albuterol today.      Relevant Medications   albuterol (PROVENTIL HFA;VENTOLIN HFA) 108 (90 Base) MCG/ACT inhaler   Chronic pain due to neoplasm    Chronic low/mid back and chest pains suspected related to dx small cell lung cancer, onset within past 1 yr, without trauma or injury. No prior history of chronic pain. Previously controlled with opiate medications, however per oncology records indicate patient's treatment is almost finished now completing radiation, and there were concerns about requesting rx early. He is no longer prescribed oxycodone / opiates from Oncology. - History of UDS  positive cocaine in 2015 - I have personally reviewed the [place] Controlled Substance Reporting System, without any significant red flags, other than one recent early request refill, most recent 3 day rx oxycodone from ED 8/27  Plan: 1. Discussion with patient and daughter about the risks of chronic opiate pain management. Advised that given his history, I am not inclined to prescribe opiates for chronic pain. - Advised he should start maximizing safe OTC meds with Tylenol and occasional NSAID 2. Obtained UDS today with reflex - pending results 3. Start Tramadol '50mg'$  BID #60 pills 0 refills for 1 month supply - discussion that if UDS is abnormal, then no further Tramadol rx, and regardless we will plan on only rx Tramadol for short term until established with Pain management 4. Referral to Pain Management - nursing staff to f/u status of prior referral, and we will arrange new referral if needed, to [facility] 5. Future discussion about alternative pain medications - Cymbalta, Gabapentin etc.      Relevant Medications   traMADol (ULTRAM) 50 MG tablet   Other Relevant Orders   Drug Scr Ur, Pain Mgmt, Reflex Conf    Adjustment disorder    Most consistent with adjustment and some depressive symptoms given recent 1 yr history with dx lung cancer and some chronic pain. No prior psych history. Other than opiate medications, no other drug use. Not consistent with major depression, some insomnia related to chronic pain mostly. Difficult to determine if fatigue / some appetite loss and weight loss is related to cancer vs chronic pain / adjustment disorder. - Treat underlying chronic pain - Discussion about potential medications for mood / sleep such as Remeron (may improve appetite / wt gain) also consider Cymbalta (chronic pain), offered future referral to therapy / counseling as well. Patient not interested in med / therapy at this time       Other Visit Diagnoses    Vision decreased       Chronic poor vision, likely age related. No recent loss of vision or other changes. Referral to optometry for eye exam and new glasses   Relevant Orders   Ambulatory referral to Optometry      Meds ordered this encounter  Medications  . colchicine 0.6 MG tablet    Sig: Take 1 tablet (0.6 mg total) by mouth daily as needed (Gout flare within 72 hours onset, may take twice daily until resolved, max 1 week).    Dispense:  20 tablet    Refill:  2  . albuterol (PROVENTIL HFA;VENTOLIN HFA) 108 (90 Base) MCG/ACT inhaler    Sig: Inhale 2 puffs into the lungs every 6 (six) hours as needed for wheezing or shortness of breath.    Dispense:  1 Inhaler    Refill:  2  . traMADol (ULTRAM) 50 MG tablet    Sig: Take 1 tablet (50 mg total) by mouth 2 (two) times daily.    Dispense:  60 tablet    Refill:  0      Follow up plan: Return in about 4 weeks (around 05/18/2016) for chronic pain, UDS.  Nobie Putnam, [facility] 04/20/2016, 5:38 PM

## 2016-04-20 NOTE — Assessment & Plan Note (Signed)
Followed by Oncology Dr Grayland Ormond and Radiation Oncology

## 2016-04-20 NOTE — Assessment & Plan Note (Signed)
Most consistent with adjustment and some depressive symptoms given recent 1 yr history with dx lung cancer and some chronic pain. No prior psych history. Other than opiate medications, no other drug use. Not consistent with major depression, some insomnia related to chronic pain mostly. Difficult to determine if fatigue / some appetite loss and weight loss is related to cancer vs chronic pain / adjustment disorder. - Treat underlying chronic pain - Discussion about potential medications for mood / sleep such as Remeron (may improve appetite / wt gain) also consider Cymbalta (chronic pain), offered future referral to therapy / counseling as well. Patient not interested in med / therapy at this time

## 2016-04-20 NOTE — Assessment & Plan Note (Signed)
Likely secondary to chronic pain, also possibly related to adjustment/mood disorder. - Discussed variety of options - Start with Tramadol for pain, then will begin to try alternative pain medications and consider Remeron vs Cymbalta

## 2016-04-20 NOTE — Assessment & Plan Note (Signed)
Prior history of opiate use regularly during chemotherapy and oncology treatment, now he has been off opiates >2-3 weeks. No clinical diagnosis of dependence. - Will not continue chronic opiate rx for pain, will use tramadol temporarily, obtained UDS and referral to Pain Management

## 2016-04-20 NOTE — Assessment & Plan Note (Signed)
On face, s/p surgical resection. Also h/o SCC. High risk with prior sun exposure. Referral to Dermatology - [facility] to re-establish for chronic skin surveillance and monitoring.

## 2016-04-20 NOTE — Assessment & Plan Note (Signed)
Without exacerbation. Secondary to chronic lifelong tobacco abuse. Previously followed by Pulmonology. Refilled Albuterol today.

## 2016-04-20 NOTE — Assessment & Plan Note (Signed)
Chronic problem, without acute flare. No evidence of tophi or chronic joint complications Refill Colchicine PRN flare, daily within 72 hr then BID up to 1 week or resolve Future follow-up consider uric acid and other uric acid lowering meds if recurrent flares

## 2016-04-20 NOTE — Assessment & Plan Note (Addendum)
Chronic low/mid back and chest pains suspected related to dx small cell lung cancer, onset within past 1 yr, without trauma or injury. No prior history of chronic pain. Previously controlled with opiate medications, however per oncology records indicate patient's treatment is almost finished now completing radiation, and there were concerns about requesting rx early. He is no longer prescribed oxycodone / opiates from Oncology. - History of UDS positive cocaine in 2015 - I have personally reviewed the [place] Controlled Substance Reporting System, without any significant red flags, other than one recent early request refill, most recent 3 day rx oxycodone from ED 8/27  Plan: 1. Discussion with patient and daughter about the risks of chronic opiate pain management. Advised that given his history, I am not inclined to prescribe opiates for chronic pain. - Advised he should start maximizing safe OTC meds with Tylenol and occasional NSAID 2. Obtained UDS today with reflex - pending results 3. Start Tramadol '50mg'$  BID #60 pills 0 refills for 1 month supply - discussion that if UDS is abnormal, then no further Tramadol rx, and regardless we will plan on only rx Tramadol for short term until established with Pain management 4. Referral to Pain Management - nursing staff to f/u status of prior referral, and we will arrange new referral if needed, to [facility] 5. Future discussion about alternative pain medications - Cymbalta, Gabapentin etc.

## 2016-04-23 ENCOUNTER — Telehealth: Payer: Self-pay | Admitting: Family Medicine

## 2016-04-23 LAB — DRUG ABUSE PANEL 10-50, U
AMPHETAMINES (1000 ng/mL SCRN): NEGATIVE
BARBITURATES: NEGATIVE
BENZODIAZEPINES: NEGATIVE
COCAINE METABOLITES: POSITIVE — AB
MARIJUANA MET (50 NG/ML SCRN): NEGATIVE
METHADONE: NEGATIVE
METHAQUALONE: NEGATIVE
OPIATES: NEGATIVE
PHENCYCLIDINE: NEGATIVE
PROPOXYPHENE: NEGATIVE

## 2016-04-23 NOTE — Telephone Encounter (Addendum)
Last visit with me 04/20/16 to establish care, see note for details. Detailed discussion at that time regarding his prior diagnosis of small cell lung cancer and chronic pain, we had discussed his prior history with chronic opiate use while under care of Oncology, and there were concerns raised by Oncology regarding potential red flags raised by them with regards to early request refills. He has had a history of prior UDS positive cocaine in 2015. During the visit, he admitted to not using any substances including cocaine. As per our discussion, he was aware that I did not feel comfortable prescribing long term chronic opiates for pain management for him, and that we would look into his prior referral to [facility] as an alternative. We obtained a UDS on 04/20/16, and given a 1 month supply of Tramadol, with the understanding that if UDS was clear, we could prescribe him a 3 month bridge of Tramadol only. He agreed to this plan. Of note, during the visit I had explained that we would work to find alternatives for his pain management and would still be able to provide medical care for him even if he was unable to be prescribed pain medicine.  Results of UDS now show POSITIVE Cocaine and all others negatives, he was appropriately negative for opiates (out for almost 2 weeks, last dose Percocet 12 days ago. I called him with these results, and he openly admitted to smoking crack cocaine recently. I explained to him that I would not be able to continue prescribing Tramadol or any controlled substances to him any longer. Also, I notified him that the prior referral to Pain Management was denied. He understood this, and stated that he anticipated he would not get accepted there, and that he understands we aren't able to prescribe these medicines to him. He stated that he does not plan to follow-up with [place] any further for his medical care.  Nobie Putnam, [facility] 04/23/2016, 4:42 PM

## 2016-05-06 ENCOUNTER — Encounter: Payer: Self-pay | Admitting: Radiation Oncology

## 2016-05-06 ENCOUNTER — Ambulatory Visit
Admission: RE | Admit: 2016-05-06 | Discharge: 2016-05-06 | Disposition: A | Discharge status: Home / Self Care | Payer: Medicaid Other | Source: Ambulatory Visit | Attending: Radiation Oncology | Admitting: Radiation Oncology

## 2016-05-06 VITALS — BP 106/73 | HR 89 | Temp 97.9°F | Wt 124.6 lb

## 2016-05-06 DIAGNOSIS — Z923 Personal history of irradiation: Secondary | ICD-10-CM | POA: Diagnosis not present

## 2016-05-06 DIAGNOSIS — R630 Anorexia: Secondary | ICD-10-CM | POA: Diagnosis not present

## 2016-05-06 DIAGNOSIS — C3491 Malignant neoplasm of unspecified part of right bronchus or lung: Secondary | ICD-10-CM

## 2016-05-06 DIAGNOSIS — R531 Weakness: Secondary | ICD-10-CM | POA: Insufficient documentation

## 2016-05-06 DIAGNOSIS — F17210 Nicotine dependence, cigarettes, uncomplicated: Secondary | ICD-10-CM | POA: Diagnosis not present

## 2016-05-06 NOTE — Progress Notes (Signed)
Radiation Oncology Follow up Note  Name: David [name]   Date:   05/06/2016 MRN:  [id number] DOB: 10-13-1955    This 60 y.o. male presents to the clinic today for 5 month follow-up status post chest and whole brain radiation for stage IIIa small cell lung cancer.  REFERRING PROVIDER: Lloyd Huger, MD  HPI: Patient is a 60 year old male now out 5 months having completed both radiation therapy to his chest and prophylactic cranial irradiation for stage IIIa small cell undifferentiated carcinoma. Seen today in routine follow-up he continues to do fairly well he is still weak as poor appetite. Specifically denies denies any headaches cough hemoptysis or chest tightness. Had a CT scan in August which shows marked decrease in size of his primary lung mass. He has another CT scan scheduled for October..  COMPLICATIONS OF TREATMENT: none  FOLLOW UP COMPLIANCE: keeps appointments   PHYSICAL EXAM:  BP 106/73   Pulse 89   Temp 97.9 F (36.6 C)   Wt 124 lb 9 oz (56.5 kg)   BMI 18.39 kg/m  Well-developed well-nourished patient in NAD. HEENT reveals PERLA, EOMI, discs not visualized.  Oral cavity is clear. No oral mucosal lesions are identified. Neck is clear without evidence of cervical or supraclavicular adenopathy. Lungs are clear to A&P. Cardiac examination is essentially unremarkable with regular rate and rhythm without murmur rub or thrill. Abdomen is benign with no organomegaly or masses noted. Motor sensory and DTR levels are equal and symmetric in the upper and lower extremities. Cranial nerves II through XII are grossly intact. Proprioception is intact. No peripheral adenopathy or edema is identified. No motor or sensory levels are noted. Crude visual fields are within normal range.  RADIOLOGY RESULTS: CT scan from August reviewed will plan on reviewing CT scan performed in October  PLAN: Present time he is doing fairly well. I'm please was overall progress I've encouraged him to  increase his nutritional intake. I will review his CT scan in October make further recommendations at that time. He continues close follow-up care with medical oncology. I've asked to see him back in 4-5 months.  I would like to take this opportunity to thank you for allowing me to participate in the care of your patient.Armstead Peaks., MD

## 2016-05-11 ENCOUNTER — Ambulatory Visit
Admission: RE | Admit: 2016-05-11 | Discharge: 2016-05-11 | Disposition: A | Discharge status: Home / Self Care | Payer: Medicaid Other | Source: Ambulatory Visit | Attending: Oncology | Admitting: Oncology

## 2016-05-11 DIAGNOSIS — R590 Localized enlarged lymph nodes: Secondary | ICD-10-CM | POA: Insufficient documentation

## 2016-05-11 DIAGNOSIS — C3491 Malignant neoplasm of unspecified part of right bronchus or lung: Secondary | ICD-10-CM

## 2016-05-11 DIAGNOSIS — R918 Other nonspecific abnormal finding of lung field: Secondary | ICD-10-CM | POA: Insufficient documentation

## 2016-05-11 DIAGNOSIS — I2510 Atherosclerotic heart disease of native coronary artery without angina pectoris: Secondary | ICD-10-CM | POA: Diagnosis not present

## 2016-05-11 DIAGNOSIS — I700 Atherosclerosis of aorta: Secondary | ICD-10-CM | POA: Insufficient documentation

## 2016-05-11 DIAGNOSIS — N200 Calculus of kidney: Secondary | ICD-10-CM | POA: Diagnosis not present

## 2016-05-16 NOTE — Progress Notes (Signed)
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  INTERVAL HISTORY: Patient returns to clinic today for further evaluation and discussion of his imaging results. He continues to complain of pain all over, but otherwise feels well. He does not complain of headache. He has no other neurologic complaints. He denies any fevers. He denies any chest pain, shortness of breath, cough, or hemoptysis. He has a good appetite and denies weight loss. He denies any nausea, vomiting, constipation, or diarrhea. He has no urinary complaints. Patient offers no further specific complaints.  REVIEW OF SYSTEMS:   Review of Systems  Constitutional: Negative for fever, malaise/fatigue and weight loss.  HENT: Negative for tinnitus.   Respiratory: Negative for cough, hemoptysis and shortness of breath.   Cardiovascular: Negative.  Negative for chest pain.  Gastrointestinal: Negative.   Genitourinary: Negative.   Musculoskeletal: Positive for joint pain.  Neurological: Negative.  Negative for weakness and headaches.  Psychiatric/Behavioral: Negative.     As per HPI. Otherwise, a complete review of systems is negative.  PAST MEDICAL HISTORY: Past Medical History:  Diagnosis Date  . Asthma   . COPD (chronic obstructive pulmonary disease) (HCC)    not on home o2  . History of melanoma   . Hypertension   . Small cell lung cancer ([place])   . Tobacco use     PAST SURGICAL HISTORY: Past Surgical History:  Procedure Laterality Date  . PERIPHERAL VASCULAR CATHETERIZATION N/A 09/15/2015   Procedure: Glori Luis Cath Insertion;  Surgeon: Algernon Huxley, MD;  Location: [facility];  Service: Cardiovascular;  Laterality: N/A;  . SKIN GRAFT     [facility]    FAMILY HISTORY: Reviewed and  unchanged. No reported history of malignancy or chronic disease.     ADVANCED DIRECTIVES:    HEALTH MAINTENANCE: Social History  Substance Use Topics  . Smoking status: Current Every Day Smoker    Packs/day: 2.00    Years: 51.00    Types: Cigarettes    Start date: 08/12/1994  . Smokeless tobacco: Never Used     Comment: used to smoke upto 2PPD, recently cut down  . Alcohol use No     Colonoscopy:  PAP:  Bone density:  Lipid panel:  Allergies  Allergen Reactions  . Tylenol [Acetaminophen] Other (See Comments)    Unable to take due to chemotherapy regimen  . Vicodin [Hydrocodone-Acetaminophen] Nausea Only    Current Outpatient Prescriptions  Medication Sig Dispense Refill  . albuterol (PROVENTIL HFA;VENTOLIN HFA) 108 (90 Base) MCG/ACT inhaler Inhale 2 puffs into the lungs every 6 (six) hours as needed for wheezing or shortness of breath. 1 Inhaler 2   No current facility-administered medications for this visit.    Facility-Administered Medications Ordered in Other Visits  Medication Dose Route Frequency Provider Last Rate Last Dose  . 0.9 %  sodium chloride infusion   Intravenous Once Lloyd Huger, MD        OBJECTIVE: Vitals:   05/17/16 1419  BP: 114/79  Pulse: 94  Resp: 18  Temp: (!) 95 F (35 C)     Body mass index is 18.46 kg/m.    ECOG FS:0 - Asymptomatic  General: Thin, no acute distress. Eyes: Pink conjunctiva, anicteric sclera. Lungs: Clear to auscultation bilaterally. Heart: Regular rate and  rhythm. No rubs, murmurs, or gallops. Abdomen: Soft, nontender, nondistended. No organomegaly noted, normoactive bowel sounds. Musculoskeletal: No edema, cyanosis, or clubbing. Neuro: Alert, answering all questions appropriately. Cranial nerves grossly intact. Skin: No rashes or petechiae noted. Psych: Normal affect.  LAB RESULTS:  Lab Results  Component Value Date   NA 132 (L) 04/04/2016   K 3.8 04/04/2016   CL 98 (L) 04/04/2016   CO2 26 04/04/2016    GLUCOSE 120 (H) 04/04/2016   BUN 15 04/04/2016   CREATININE 1.17 04/04/2016   CALCIUM 9.1 04/04/2016   PROT 7.1 04/04/2016   ALBUMIN 3.7 04/04/2016   AST 18 04/04/2016   ALT 13 (L) 04/04/2016   ALKPHOS 55 04/04/2016   BILITOT 0.7 04/04/2016   GFRNONAA >60 04/04/2016   GFRAA >60 04/04/2016    Lab Results  Component Value Date   WBC 6.3 04/04/2016   NEUTROABS 14.1 (H) 12/31/2015   HGB 13.0 04/04/2016   HCT 37.0 (L) 04/04/2016   MCV 87.6 04/04/2016   PLT 183 04/04/2016     STUDIES: Ct Chest Wo Contrast  Result Date: 05/11/2016 CLINICAL DATA:  Re- stage right upper lobe small cell lung cancer status post chemotherapy and radiation therapy. Additional history of melanoma. COPD. EXAM: CT CHEST WITHOUT CONTRAST TECHNIQUE: Multidetector CT imaging of the chest was performed following the standard protocol without IV contrast. COMPARISON:  04/04/2016 chest CT. FINDINGS: Cardiovascular: Normal heart size. Trace anterior pericardial effusion/ thickening, stable. Left internal jugular MediPort terminates in the middle third of the superior vena cava. Left main, left anterior descending, left circumflex and right coronary atherosclerosis. Atherosclerotic nonaneurysmal thoracic aorta. Normal caliber pulmonary arteries. Mediastinum/Nodes: No discrete thyroid nodules. Unremarkable esophagus. No axillary adenopathy. Mildly enlarged 1.1 cm right hilar node (series 2/ image 78), previously 1.2 cm using similar measurement technique on 04/04/2016, not appreciably changed. No additional pathologically enlarged mediastinal or gross hilar nodes on this noncontrast study. Lungs/Pleura: No pneumothorax. No pleural effusion. Moderate centrilobular emphysema with mild diffuse bronchial wall thickening. Spiculated right upper lobe 1.7 x 1.3 cm pulmonary nodule (series 3/ image 59), previously 1.5 x 1.1 cm on 04/04/2016 using similar measurement technique, mildly increased in size. Bandlike 1.9 x 0.7 cm opacity  peripheral to the dominant spiculated right upper lobe pulmonary nodule (series 3/ image 61) is stable. Faintly calcified satellite pulmonary nodules in the right upper lobe are stable. Additional faintly calcified subcentimeter pulmonary nodules in the anterior right middle lobe, right lower lobe, left upper lobe and left lower lobe are stable. Scattered subcentimeter perifissural nodules along both major fissures are stable. Subpleural 5 mm right lower lobe pulmonary nodule (series 3/ image 134) is increased from 3 mm. Subpleural medial left lower lobe 6 mm nodule (series 3/image 88) is increased from 3 mm. No acute consolidative airspace disease or additional significant pulmonary nodules. Upper abdomen: Punctate nonobstructing upper left renal stone. Musculoskeletal: No aggressive appearing focal osseous lesions. Mild thoracic spondylosis. IMPRESSION: 1. Spiculated right upper lobe 1.7 cm pulmonary nodule, mildly increased in size since 04/04/2016, worrisome for progression of the primary neoplasm. 2. At least 2 pulmonary nodules have increased in size since 04/04/2016, largest 6 mm in the medial subpleural left lower lobe, worrisome for pulmonary metastases. Attention on short-term follow-up chest CT in 3 months is advised. 3. Stable mild right hilar adenopathy. No new nodal sites of disease in the chest. 4. Additional findings include aortic atherosclerosis, left main and 3 vessel coronary atherosclerosis, and punctate nonobstructing left upper renal stone. Electronically Signed  By: Ilona Sorrel M.D.   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  PLAN:    1. 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  CT scan results from May 11, 2016 reviewed independently and reported as above with suspicion for recurrent disease. Will discussed patient at cancer conference later this week to determine whether to proceed  with biopsy, that scan, or repeat CT scan in 3-4 months. Appropriate follow-up will be made at that time. 2. Weight loss: Resolved. Patient now gaining weight. 3. History of melanoma: Patient is a poor historian but appears to have been greater than 3 years ago. Monitor. 4. Pain: Patient will no longer receive narcotics from this clinic. Of note, patient also had a recent urine drug screen positive for cocaine. 5. Cough: Recommended patient try OTC medications. Patient was previously given a prescription for albuterol and a pulmonary referral. 6. 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 Patient does not complain of this today. 7. Headaches: Recent MRI the brain did not reveal metastatic disease.   Patient expressed understanding and was in agreement with this plan. He also understands that He can call clinic at any time with any questions, concerns, or complaints.   Lloyd Huger, MD   05/20/2016 10:21 AM

## 2016-05-17 ENCOUNTER — Inpatient Hospital Stay: Payer: Medicaid Other | Attending: Oncology | Admitting: Oncology

## 2016-05-17 VITALS — BP 114/79 | HR 94 | Temp 95.0°F | Resp 18 | Wt 125.0 lb

## 2016-05-17 DIAGNOSIS — Z9221 Personal history of antineoplastic chemotherapy: Secondary | ICD-10-CM | POA: Diagnosis not present

## 2016-05-17 DIAGNOSIS — I10 Essential (primary) hypertension: Secondary | ICD-10-CM | POA: Diagnosis not present

## 2016-05-17 DIAGNOSIS — C3491 Malignant neoplasm of unspecified part of right bronchus or lung: Secondary | ICD-10-CM

## 2016-05-17 DIAGNOSIS — F17210 Nicotine dependence, cigarettes, uncomplicated: Secondary | ICD-10-CM | POA: Diagnosis not present

## 2016-05-17 DIAGNOSIS — Z79899 Other long term (current) drug therapy: Secondary | ICD-10-CM | POA: Diagnosis not present

## 2016-05-17 DIAGNOSIS — J449 Chronic obstructive pulmonary disease, unspecified: Secondary | ICD-10-CM | POA: Diagnosis not present

## 2016-05-17 DIAGNOSIS — C3411 Malignant neoplasm of upper lobe, right bronchus or lung: Secondary | ICD-10-CM

## 2016-05-17 DIAGNOSIS — Z923 Personal history of irradiation: Secondary | ICD-10-CM

## 2016-05-17 NOTE — Progress Notes (Signed)
States is having pain in right wrist. Has appt at pain clinic on 10/30. Requests medication to help with pain.

## 2016-05-21 ENCOUNTER — Telehealth: Payer: Self-pay | Admitting: Oncology

## 2016-05-21 ENCOUNTER — Encounter: Payer: Self-pay | Admitting: Medical Oncology

## 2016-05-21 ENCOUNTER — Emergency Department
Admission: EM | Admit: 2016-05-21 | Discharge: 2016-05-21 | Discharge status: Against Medical Advice | Payer: Medicaid Other | Attending: Emergency Medicine | Admitting: Emergency Medicine

## 2016-05-21 ENCOUNTER — Other Ambulatory Visit: Payer: Self-pay | Admitting: *Deleted

## 2016-05-21 DIAGNOSIS — F1490 Cocaine use, unspecified, uncomplicated: Secondary | ICD-10-CM | POA: Insufficient documentation

## 2016-05-21 DIAGNOSIS — F17210 Nicotine dependence, cigarettes, uncomplicated: Secondary | ICD-10-CM | POA: Diagnosis not present

## 2016-05-21 DIAGNOSIS — I10 Essential (primary) hypertension: Secondary | ICD-10-CM | POA: Insufficient documentation

## 2016-05-21 DIAGNOSIS — J449 Chronic obstructive pulmonary disease, unspecified: Secondary | ICD-10-CM | POA: Diagnosis not present

## 2016-05-21 DIAGNOSIS — C3490 Malignant neoplasm of unspecified part of unspecified bronchus or lung: Secondary | ICD-10-CM

## 2016-05-21 DIAGNOSIS — J45909 Unspecified asthma, uncomplicated: Secondary | ICD-10-CM | POA: Insufficient documentation

## 2016-05-21 DIAGNOSIS — R52 Pain, unspecified: Secondary | ICD-10-CM | POA: Diagnosis present

## 2016-05-21 DIAGNOSIS — G8929 Other chronic pain: Secondary | ICD-10-CM

## 2016-05-21 DIAGNOSIS — Z85118 Personal history of other malignant neoplasm of bronchus and lung: Secondary | ICD-10-CM | POA: Insufficient documentation

## 2016-05-21 NOTE — Telephone Encounter (Signed)
Girlfriend David [name] wants you to please call her with results of CT. Please call this number: [phone]. Thanks.

## 2016-05-21 NOTE — ED Notes (Signed)
Pt states chronic right hip pain, shoulder pain and back pain, pt has hx of small cell, pt states he has been using heat and ben-gay gel, pt awake and alert

## 2016-05-21 NOTE — ED Notes (Signed)
PA aware of pt leaving, pt walked out of room

## 2016-05-21 NOTE — Telephone Encounter (Signed)
Left vm message for David [name] regarding results and plan moving forward. Patient to be scheduled for PET scan in the next week and follow up with Dr. Grayland Ormond 1-2 days after.

## 2016-05-21 NOTE — ED Triage Notes (Signed)
Pt has been having chronic body aches/back pain for the last few weeks. Pt has Lung CA but dr Massie Maroon has referred pt to pain clinic which pt has not been able to see, appt is 10/30.

## 2016-05-21 NOTE — ED Provider Notes (Signed)
[facility] Emergency Department Provider Note  ____________________________________________  Time seen: Approximately 5:56 PM  I have reviewed the triage vital signs and the nursing notes.   HISTORY  Chief Complaint Generalized Body Aches; Tinnitus; and Back Pain    HPI David [name] is a 60 y.o. male , NAD, presents to emergency department accompanied by his girlfriend who assists with history. Patient notes he has small cell lung cancer in which he has had chronic diffuse pains in his muscles and joints throughout the last year. Was on pain medication given to him by his oncologist but states he has not had any narcotic pain medication since August. He has been referred to pain management has an appointment on 06/07/2016. States that his pains are severe and not controlled with over-the-counter medications. Patient endorses use of cocaine to help control his pain and "numb" him to the pain with last use 2 days ago. Presents to the emergency department for pain control.   Past Medical History:  Diagnosis Date  . Asthma   . COPD (chronic obstructive pulmonary disease) (HCC)    not on home o2  . History of melanoma   . Hypertension   . Small cell lung cancer ([place])   . Tobacco use     Patient Active Problem List   Diagnosis Date Noted  . Tobacco abuse 04/20/2016  . History of melanoma 04/20/2016  . COPD (chronic obstructive pulmonary disease) ([place]) 04/20/2016  . Gout 04/20/2016  . Adjustment disorder 04/20/2016  . Insomnia 04/20/2016  . Chronic pain due to neoplasm 04/20/2016  . Opiate use 04/20/2016  . Small cell lung cancer ([place]) 09/08/2015    Past Surgical History:  Procedure Laterality Date  . PERIPHERAL VASCULAR CATHETERIZATION N/A 09/15/2015   Procedure: Glori Luis Cath Insertion;  Surgeon: Algernon Huxley, MD;  Location: [facility];  Service: Cardiovascular;  Laterality: N/A;  . SKIN GRAFT     [facility]    Prior to Admission medications    Medication Sig Start Date End Date Taking? Authorizing Provider  albuterol (PROVENTIL HFA;VENTOLIN HFA) 108 (90 Base) MCG/ACT inhaler Inhale 2 puffs into the lungs every 6 (six) hours as needed for wheezing or shortness of breath. 04/20/16   Olin Hauser, DO    Allergies Tylenol [acetaminophen] and Vicodin [hydrocodone-acetaminophen]  Family History  Problem Relation Age of Onset  . Lung cancer Mother   . Heart attack Mother 26  . Cirrhosis Father 54    Social History Social History  Substance Use Topics  . Smoking status: Current Every Day Smoker    Packs/day: 2.00    Years: 51.00    Types: Cigarettes    Start date: 08/12/1994  . Smokeless tobacco: Never Used     Comment: used to smoke upto 2PPD, recently cut down  . Alcohol use No     Review of Systems Constitutional: No fever/chills ENT: Chronic tinnitus. Musculoskeletal: Positive generalized myalgias and bony pain.  Skin: Negative for rash, redness, swelling, skin sores.  ____________________________________________   PHYSICAL EXAM:  VITAL SIGNS: ED Triage Vitals  Enc Vitals Group     BP 05/21/16 1702 117/63     Pulse Rate 05/21/16 1702 82     Resp 05/21/16 1702 18     Temp 05/21/16 1702 98 F (36.7 C)     Temp Source 05/21/16 1702 Oral     SpO2 05/21/16 1702 100 %     Weight 05/21/16 1702 125 lb (56.7 kg)  Height 05/21/16 1702 '5\' 9"'$  (1.753 m)     Head Circumference --      Peak Flow --      Pain Score 05/21/16 1703 10     Pain Loc --      Pain Edu? --      Excl. in [place]? --      Constitutional: Alert and oriented. Cachectic appearing and in no acute distress. Eyes: Conjunctivae are normal.  Head: Atraumatic. Respiratory: Normal respiratory effort without tachypnea or retractions.  Musculoskeletal: Full range of motion of bilateral upper and lower extremities without difficulty. Neurologic:  Normal speech and language. No gross focal neurologic deficits are appreciated. Normal gait with  normal speech. Skin:  Skin is warm, dry and intact. No rash, redness, swelling noted. Psychiatric: Mood and affect are normal. Speech and behavior are normal.    ____________________________________________   LABS  None ____________________________________________  EKG  None ____________________________________________  RADIOLOGY  None ____________________________________________    PROCEDURES  Procedure(s) performed: None   Procedures   Medications - No data to display   ____________________________________________   INITIAL IMPRESSION / ASSESSMENT AND PLAN / ED COURSE  Pertinent labs & imaging results that were available during my care of the patient were reviewed by me and considered in my medical decision making (see chart for details).  Clinical Course  Comment By Time  During the discussion regards to patient's use of cocaine, he became irate and angry at the possibility that narcotic pain medicines would not be prescribed or given to him here in the emergency department. He stormed out of the exam room stating "I will just go get medicines somewhere else". Patient's significant other then went on to tell me that the patient has a history of cocaine addiction and has not sought treatment for such. I encouraged her to contact our team assess or assistance. She did try to talk to the patient over the phone to encourage him to return to the emergency department to have further discussions in regards to cocaine use but the patient refused. Patient was able to get out of the exam bed, ambulate with a swift gait out of the emergency department without any distress or difficulty. Braxton Feathers, PA-C 10/13 1825    Patient's diagnosis is consistent with chronic pain due to cancer and cocaine use. Patient's significant other was encouraged to contact [facility] for further assistance.  ____________________________________________  FINAL CLINICAL IMPRESSION(S) / ED  DIAGNOSES  Final diagnoses:  Other chronic pain  Cocaine use      NEW MEDICATIONS STARTED DURING THIS VISIT:  Discharge Medication List as of 05/21/2016  6:35 PM           Braxton Feathers, PA-C 05/21/16 [place] Quigley, MD 05/21/16 1932

## 2016-05-26 ENCOUNTER — Telehealth: Payer: Self-pay | Admitting: *Deleted

## 2016-05-26 ENCOUNTER — Other Ambulatory Visit: Payer: Self-pay | Admitting: *Deleted

## 2016-05-26 DIAGNOSIS — C3490 Malignant neoplasm of unspecified part of unspecified bronchus or lung: Secondary | ICD-10-CM

## 2016-05-26 NOTE — Telephone Encounter (Signed)
Call placed to patients girlfriend David [name] to update her on patients appointment. Patients PET scan has been cancelled, will see Dr. Mortimer Fries on 11/8. David [name] wanted to let us know that patient was seen in ER on Friday 10/13 for pain, ER unable to prescribe pain medications due to patient being positive for cocaine. She is currently trying to get patient assistance through residential treatment facility. The [facility] has recommended patient start drug called amantadine (apparently works in blocking desire for cocaine) which will be prescribed by them, David [name] wants to know if this is okay for patient to take given his cancer diagnosis and if it would interfere with any treatments he might need. Please advise.

## 2016-05-27 ENCOUNTER — Ambulatory Visit: Admission: RE | Admit: 2016-05-27 | Payer: Medicaid Other | Source: Ambulatory Visit

## 2016-05-27 NOTE — Telephone Encounter (Signed)
That's fine.  Thank you.

## 2016-05-27 NOTE — Telephone Encounter (Signed)
Patients family notified.

## 2016-05-31 ENCOUNTER — Ambulatory Visit: Payer: Medicaid Other | Admitting: Oncology

## 2016-06-16 ENCOUNTER — Inpatient Hospital Stay: Payer: Medicaid Other | Attending: Oncology

## 2016-06-16 ENCOUNTER — Ambulatory Visit (INDEPENDENT_AMBULATORY_CARE_PROVIDER_SITE_OTHER): Payer: Medicaid Other | Admitting: Internal Medicine

## 2016-06-16 ENCOUNTER — Encounter: Payer: Self-pay | Admitting: Internal Medicine

## 2016-06-16 VITALS — BP 110/64 | HR 86 | Ht 69.0 in | Wt 124.0 lb

## 2016-06-16 DIAGNOSIS — J449 Chronic obstructive pulmonary disease, unspecified: Secondary | ICD-10-CM | POA: Diagnosis not present

## 2016-06-16 MED ORDER — FLUTICASONE-SALMETEROL 250-50 MCG/DOSE IN AEPB: 1.0000 | INHALATION_SPRAY | Freq: Two times a day (BID) | RESPIRATORY_TRACT | 5 refills | Status: DC

## 2016-06-16 MED ORDER — TIOTROPIUM BROMIDE MONOHYDRATE 18 MCG IN CAPS: 18.0000 ug | ORAL_CAPSULE | Freq: Every day | RESPIRATORY_TRACT | 5 refills | Status: DC

## 2016-06-16 MED ORDER — ALBUTEROL SULFATE HFA 108 (90 BASE) MCG/ACT IN AERS: 2.0000 | INHALATION_SPRAY | RESPIRATORY_TRACT | 6 refills | Status: DC | PRN

## 2016-06-16 NOTE — Addendum Note (Signed)
Addended by: Oscar La R on: 06/16/2016 12:06 PM   Modules accepted: Orders

## 2016-06-16 NOTE — Patient Instructions (Signed)
Start Inhaler therapy Check ONO Plan for procedure   The Risks and Benefits of the Bronchoscopy with EBUS were explained to patient/family and I have discussed the risk for acute bleeding, increased chance of infection, increased chance of respiratory failure and cardiac arrest and death. I have also explained to avoid all types of NSAIDs to decrease chance of bleeding, and to avoid food and drinks the midnight prior to procedure.  The procedure consists of a video camera with a light source to be placed and inserted  into the lungs to  look for abnormal tissue and to obtain tissue samples by using needle and biopsy tools.  The patient/family understand the risks and benefits and have agreed to proceed with procedure.   Chronic Obstructive Pulmonary Disease Chronic obstructive pulmonary disease (COPD) is a common lung condition in which airflow from the lungs is limited. COPD is a general term that can be used to describe many different lung problems that limit airflow, including both chronic bronchitis and emphysema. If you have COPD, your lung function will probably never return to normal, but there are measures you can take to improve lung function and make yourself feel better. CAUSES   Smoking (common).  Exposure to secondhand smoke.  Genetic problems.  Chronic inflammatory lung diseases or recurrent infections. SYMPTOMS  Shortness of breath, especially with physical activity.  Deep, persistent (chronic) cough with a large amount of thick mucus.  Wheezing.  Rapid breaths (tachypnea).  Gray or bluish discoloration (cyanosis) of the skin, especially in your fingers, toes, or lips.  Fatigue.  Weight loss.  Frequent infections or episodes when breathing symptoms become much worse (exacerbations).  Chest tightness. DIAGNOSIS Your health care provider will take a medical history and perform a physical examination to diagnose COPD. Additional tests for COPD may  include:  Lung (pulmonary) function tests.  Chest X-ray.  CT scan.  Blood tests. TREATMENT  Treatment for COPD may include:  Inhaler and nebulizer medicines. These help manage the symptoms of COPD and make your breathing more comfortable.  Supplemental oxygen. Supplemental oxygen is only helpful if you have a low oxygen level in your blood.  Exercise and physical activity. These are beneficial for nearly all people with COPD.  Lung surgery or transplant.  Nutrition therapy to gain weight, if you are underweight.  Pulmonary rehabilitation. This may involve working with a team of health care providers and specialists, such as respiratory, occupational, and physical therapists. HOME CARE INSTRUCTIONS  Take all medicines (inhaled or pills) as directed by your health care provider.  Avoid over-the-counter medicines or cough syrups that dry up your airway (such as antihistamines) and slow down the elimination of secretions unless instructed otherwise by your health care provider.  If you are a smoker, the most important thing that you can do is stop smoking. Continuing to smoke will cause further lung damage and breathing trouble. Ask your health care provider for help with quitting smoking. He or she can direct you to community resources or hospitals that provide support.  Avoid exposure to irritants such as smoke, chemicals, and fumes that aggravate your breathing.  Use oxygen therapy and pulmonary rehabilitation if directed by your health care provider. If you require home oxygen therapy, ask your health care provider whether you should purchase a pulse oximeter to measure your oxygen level at home.  Avoid contact with individuals who have a contagious illness.  Avoid extreme temperature and humidity changes.  Eat healthy foods. Eating smaller, more frequent meals and  resting before meals may help you maintain your strength.  Stay active, but balance activity with periods of  rest. Exercise and physical activity will help you maintain your ability to do things you want to do.  Preventing infection and hospitalization is very important when you have COPD. Make sure to receive all the vaccines your health care provider recommends, especially the pneumococcal and influenza vaccines. Ask your health care provider whether you need a pneumonia vaccine.  Learn and use relaxation techniques to manage stress.  Learn and use controlled breathing techniques as directed by your health care provider. Controlled breathing techniques include:  Pursed lip breathing. Start by breathing in (inhaling) through your nose for 1 second. Then, purse your lips as if you were going to whistle and breathe out (exhale) through the pursed lips for 2 seconds.  Diaphragmatic breathing. Start by putting one hand on your abdomen just above your waist. Inhale slowly through your nose. The hand on your abdomen should move out. Then purse your lips and exhale slowly. You should be able to feel the hand on your abdomen moving in as you exhale.  Learn and use controlled coughing to clear mucus from your lungs. Controlled coughing is a series of short, progressive coughs. The steps of controlled coughing are: 1. Lean your head slightly forward. 2. Breathe in deeply using diaphragmatic breathing. 3. Try to hold your breath for 3 seconds. 4. Keep your mouth slightly open while coughing twice. 5. Spit any mucus out into a tissue. 6. Rest and repeat the steps once or twice as needed. SEEK MEDICAL CARE IF:  You are coughing up more mucus than usual.  There is a change in the color or thickness of your mucus.  Your breathing is more labored than usual.  Your breathing is faster than usual. SEEK IMMEDIATE MEDICAL CARE IF:  You have shortness of breath while you are resting.  You have shortness of breath that prevents you from:  Being able to talk.  Performing your usual physical activities.  You  have chest pain lasting longer than 5 minutes.  Your skin color is more cyanotic than usual.  You measure low oxygen saturations for longer than 5 minutes with a pulse oximeter. MAKE SURE YOU:  Understand these instructions.  Will watch your condition.  Will get help right away if you are not doing well or get worse.   This information is not intended to replace advice given to you by your health care provider. Make sure you discuss any questions you have with your health care provider.   Document Released: 05/05/2005 Document Revised: 08/16/2014 Document Reviewed: 03/22/2013 Elsevier Interactive Patient Education [employer].

## 2016-06-16 NOTE — Progress Notes (Signed)
[facility] Consultation      Date: 06/16/2016,   MRN# [id number] SALIH WILLIAMSON Mar 24, 1956 Code Status:  Code Status History    Date Active Date Inactive Code Status Order ID Comments User Context   12/31/2015  5:40 PM 01/02/2016  2:11 PM Full Code [id number]  Gladstone Lighter, MD Inpatient     Hosp day:'@LENGTHOFSTAYDAYS'$ @ Referring MD: '@ATDPROV'$ @     PCP:      AdmissionWeight: 124 lb (56.2 kg)                 CurrentWeight: 124 lb (56.2 kg) ALI MOHL is a 60 y.o. old male seen in consultation for RUL nodule at the request of Dr. Grayland Ormond.     CHIEF COMPLAINT:   I have small cell lung cancer   HISTORY OF PRESENT ILLNESS   60 yo white male with extensive smoking history has been dx with Stage 2 Small cell lung cancer dx with RT lung biopsy of RUL nodule in Jan 2017 Patient underwent XRT,  Patient has had several CT chest scans to assess interval changes and the RUL mass/nodule has increased in size over last 6 months Patient actively smokes, has chronic SOB and DOE and wheezing for many years He has a chronic cough and chest congestion  Patient has no signs of infection at this time Patient has very poor appetite and looks very ill. Patient this and cachectic and is losing weight Has increased fatigue  And lethargy  According to previous rest results, patient is pos for Cocaine  Office Spiro obtained 06/16/16 which reveal moderate Obstructive airways disease Patient accompanied by his girlfriend Patient   PAST MEDICAL HISTORY   Past Medical History:  Diagnosis Date  . Asthma   . COPD (chronic obstructive pulmonary disease) (HCC)    not on home o2  . History of melanoma   . Hypertension   . Small cell lung cancer ([place])   . Tobacco use      SURGICAL HISTORY   Past Surgical History:  Procedure Laterality Date  . PERIPHERAL VASCULAR CATHETERIZATION N/A 09/15/2015   Procedure: Glori Luis Cath Insertion;  Surgeon: Algernon Huxley, MD;  Location:  [facility];  Service: Cardiovascular;  Laterality: N/A;  . SKIN GRAFT     [facility]     FAMILY HISTORY   Family History  Problem Relation Age of Onset  . Lung cancer Mother   . Heart attack Mother 80  . Cirrhosis Father 49     SOCIAL HISTORY   Social History  Substance Use Topics  . Smoking status: Current Every Day Smoker    Packs/day: 1.00    Years: 51.00    Types: Cigarettes    Start date: 08/12/1994  . Smokeless tobacco: Never Used     Comment: used to smoke upto 2PPD, recently cut down  . Alcohol use No  +drug abuse with cocaine +cocoaine  MEDICATIONS    Home Medication:  Current Outpatient Rx  . Order #: [id number] Class: Normal  . Order #: [id number] Class: Historical Med  . Order #: [id number] Class: Historical Med    Current Medication:  Current Outpatient Prescriptions:  .  albuterol (PROVENTIL HFA;VENTOLIN HFA) 108 (90 Base) MCG/ACT inhaler, Inhale 2 puffs into the lungs every 6 (six) hours as needed for wheezing or shortness of breath., Disp: 1 Inhaler, Rfl: 2 .  meloxicam (MOBIC) 7.5 MG tablet, Take 7.5 mg by mouth every morning., Disp: , Rfl: 1 .  oxyCODONE (OXY IR/ROXICODONE) 5  MG immediate release tablet, Take 5 mg by mouth 2 times daily at 12 noon and 4 pm., Disp: , Rfl:  No current facility-administered medications for this visit.   Facility-Administered Medications Ordered in Other Visits:  .  0.9 %  sodium chloride infusion, , Intravenous, Once, Lloyd Huger, MD    ALLERGIES   Tylenol [acetaminophen] and Vicodin [hydrocodone-acetaminophen]     REVIEW OF SYSTEMS   Review of Systems  Constitutional: Positive for malaise/fatigue and weight loss. Negative for chills, diaphoresis and fever.  HENT: Positive for congestion. Negative for hearing loss.   Eyes: Negative for blurred vision and double vision.  Respiratory: Positive for cough, shortness of breath and wheezing. Negative for hemoptysis and sputum production.   Cardiovascular:  Negative for chest pain, palpitations, orthopnea and leg swelling.  Gastrointestinal: Negative for abdominal pain, heartburn, nausea and vomiting.  Genitourinary: Negative for dysuria and urgency.  Musculoskeletal: Negative for back pain, myalgias and neck pain.  Skin: Negative for rash.  Neurological: Positive for weakness. Negative for dizziness.  Endo/Heme/Allergies: Does not bruise/bleed easily.  Psychiatric/Behavioral: Negative for depression.  All other systems reviewed and are negative.    VS: BP 110/64 (BP Location: Left Arm, Cuff Size: Normal)   Pulse 86   Ht '5\' 9"'$  (1.753 m)   Wt 124 lb (56.2 kg)   SpO2 99%   BMI 18.31 kg/m      PHYSICAL EXAM  Physical Exam  Constitutional: He is oriented to person, place, and time. No distress.  Thin, pale and cachectic, sickly looking  HENT:  Head: Normocephalic and atraumatic.  Mouth/Throat: No oropharyngeal exudate.  Eyes: EOM are normal. Pupils are equal, round, and reactive to light. No scleral icterus.  Neck: Neck supple.  Cardiovascular: Normal rate, regular rhythm and normal heart sounds.   No murmur heard. Pulmonary/Chest: No respiratory distress. He has no wheezes. He has no rales.  Abdominal: Soft. Bowel sounds are normal.  Musculoskeletal: Normal range of motion. He exhibits no edema.  Neurological: He is alert and oriented to person, place, and time. No cranial nerve deficit.  Skin: Skin is warm. He is not diaphoretic.  Psychiatric: He has a normal mood and affect.       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 Findings concerning for recurrent versus new  primary  Overall, patient prognosis is very poor, and he has moderate to high risk for post op complications. He is at high risk for death   The Risks and Benefits of the Bronchoscopy with ENB were explained to patient/family and I have discussed the risk for acute bleeding, increased chance of infection, increased chance of respiratory failure and cardiac arrest and death. I have also explained to avoid all types of NSAIDs to decrease chance of bleeding, and to avoid food and drinks the midnight prior to procedure.  The procedure consists of a video camera with a light source to be placed and inserted  into the lungs to  look for abnormal tissue and to obtain tissue samples by using needle and biopsy tools.  The patient/family understand the risks  and benefits and have agreed to proceed with procedure. Will plan for ENB  We will need to optimize COPD as best as possible with aggressive BD therapy Will start Advair and Spiriva, albuterol as needed And check ONO.   I have personally obtained a history, examined the patient, evaluated laboratory and independently reviewed imaging results, formulated the assessment and plan and placed orders.  The Patient requires high complexity decision making for assessment and support, frequent evaluation and titration of therapies, application of advanced monitoring technologies and extensive interpretation of multiple databases.     Patient/Family are satisfied with Plan of action and management. All questions answered  Corrin Parker, M.D.  [facility]  Medical Director [place] Director [facility] Cardio-Pulmonary Department

## 2016-06-21 ENCOUNTER — Telehealth: Payer: Self-pay | Admitting: *Deleted

## 2016-06-21 ENCOUNTER — Telehealth: Payer: Self-pay | Admitting: Internal Medicine

## 2016-06-21 NOTE — Telephone Encounter (Signed)
Pt fiance states she has not heard anything from the oxygen company. Please call and advise of reason for delay.

## 2016-06-21 NOTE — Telephone Encounter (Signed)
PA for Advair submitted thru [employer]. Advair approved until 06/16/2017 Auth # [id number].  Pharmacy informed.  [id number]- [place], [place]- [phone](Phone) [phone](Fax)

## 2016-06-21 NOTE — Telephone Encounter (Signed)
Patient's sister called to inquire about any Christmas assistance the cancer center might offer for the patient's grandchildren.

## 2016-06-21 NOTE — Telephone Encounter (Signed)
David [name] contacted [employer] and they will be contacting them today. Informed pt and [name] they will bring out ONO machine. Nothing further needed.

## 2016-06-21 NOTE — Telephone Encounter (Signed)
Patient's sister called to request a gas voucher for appointments tomorrow. Request approved gas voucher for $20.00. Left at desk.

## 2016-06-22 ENCOUNTER — Inpatient Hospital Stay: Admission: RE | Admit: 2016-06-22 | Payer: Medicaid Other | Source: Ambulatory Visit

## 2016-06-22 ENCOUNTER — Telehealth: Payer: Self-pay | Admitting: Internal Medicine

## 2016-06-22 NOTE — Telephone Encounter (Signed)
Pt girlfriend calling stating she thinks patient was nervous about the procedure and is doing a bit better now that he knows we are going to move it up Would like someone to call back and help reschedule the appointments

## 2016-06-22 NOTE — Telephone Encounter (Signed)
Spoke with pt and girlfriend and they are cancelling his ENB for 06/23/16. Will r/s in near future.

## 2016-06-23 ENCOUNTER — Ambulatory Visit: Admission: RE | Admit: 2016-06-23 | Payer: Medicaid Other | Source: Ambulatory Visit | Admitting: Internal Medicine

## 2016-06-23 ENCOUNTER — Ambulatory Visit (INDEPENDENT_AMBULATORY_CARE_PROVIDER_SITE_OTHER): Payer: Medicaid Other | Admitting: Family Medicine

## 2016-06-23 ENCOUNTER — Encounter: Admission: RE | Payer: Self-pay | Source: Ambulatory Visit

## 2016-06-23 ENCOUNTER — Encounter: Payer: Self-pay | Admitting: Family Medicine

## 2016-06-23 ENCOUNTER — Encounter: Payer: Self-pay | Admitting: Internal Medicine

## 2016-06-23 VITALS — BP 102/66 | HR 89 | Temp 97.8°F | Resp 16 | Ht 69.0 in | Wt 125.0 lb

## 2016-06-23 DIAGNOSIS — G893 Neoplasm related pain (acute) (chronic): Secondary | ICD-10-CM

## 2016-06-23 DIAGNOSIS — M150 Primary generalized (osteo)arthritis: Secondary | ICD-10-CM

## 2016-06-23 DIAGNOSIS — M25511 Pain in right shoulder: Secondary | ICD-10-CM

## 2016-06-23 DIAGNOSIS — F1911 Other psychoactive substance abuse, in remission: Secondary | ICD-10-CM | POA: Insufficient documentation

## 2016-06-23 DIAGNOSIS — G8929 Other chronic pain: Secondary | ICD-10-CM

## 2016-06-23 DIAGNOSIS — Z720 Tobacco use: Secondary | ICD-10-CM | POA: Diagnosis not present

## 2016-06-23 DIAGNOSIS — M25512 Pain in left shoulder: Secondary | ICD-10-CM

## 2016-06-23 DIAGNOSIS — M7552 Bursitis of left shoulder: Secondary | ICD-10-CM

## 2016-06-23 DIAGNOSIS — J449 Chronic obstructive pulmonary disease, unspecified: Secondary | ICD-10-CM

## 2016-06-23 DIAGNOSIS — F1910 Other psychoactive substance abuse, uncomplicated: Secondary | ICD-10-CM | POA: Diagnosis not present

## 2016-06-23 DIAGNOSIS — M159 Polyosteoarthritis, unspecified: Secondary | ICD-10-CM | POA: Insufficient documentation

## 2016-06-23 SURGERY — ELECTROMAGNETIC NAVIGATION BRONCHOSCOPY
Anesthesia: General

## 2016-06-23 MED ORDER — LIDOCAINE HCL (PF) 1 % IJ SOLN
4.0000 mL | Freq: Once | INTRAMUSCULAR | Status: AC
Start: 2016-06-23 — End: 2016-06-23
  Administered 2016-06-23: 4 mL

## 2016-06-23 MED ORDER — MELOXICAM 15 MG PO TABS: 15.0000 mg | ORAL_TABLET | Freq: Every morning | ORAL | 3 refills | Status: DC

## 2016-06-23 MED ORDER — METHYLPREDNISOLONE ACETATE 40 MG/ML IJ SUSP
40.0000 mg | Freq: Once | INTRAMUSCULAR | Status: AC
  Administered 2016-06-23: 40 mg via INTRA_ARTICULAR

## 2016-06-23 NOTE — Assessment & Plan Note (Signed)
Treat underlying chronic pain with non narcotic medications at this time, given history of substance abuse with cocaine positive, see last discussion 04/2016 after UDS result. Denied from [facility]. He understands no further refills Tramadol at this time from our office. - Increase Meloxicam - Follow-up in future consider alternative meds such as baclofen, cymbalta, gabapentin

## 2016-06-23 NOTE — Assessment & Plan Note (Signed)
Consistent with subacute on chronic Left shoulder bursitis vs rotator cuff tendinopathy with some reduced active ROM, possibly degenerative tearing but no acute shoulder weakness. 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 - No clear etiology of injury, but known arthritis underlying. Not consistent with gout. - No recent imaging. Last R shoulder 02/2016 without arthropathy  Plan: 1. Left shoulder subacromial steroid injection today, see procedure note 2. Increase Meloxicam from 7.5 to '15mg'$  daily, refill sent 3. May take Tylenol Ex Str 1-2 q 6 hr PRN 4. Relative rest but keep shoulder mobile, demonstrated ROM exercises, avoid heavy lifting 5. May try heating pad PRN 6. RTC 1 month re-evaluation, if not improved consider X-rays eval for arthritis, may consider baclofen muscle relaxer if hard to sleep at night, patient is not good candidate for any aggressive interventions or surgical fix if soft tissue tear given co morbidities, cancer, and frailty

## 2016-06-23 NOTE — Assessment & Plan Note (Signed)
Since last visit 04/2016 with positive UDS cocaine, patient states he has tried to reduce using substances, and no longer associating with friends who used, he is seeking help for substance addiction in community. - No further request for any opiate medications - See treatment plan above, A&P

## 2016-06-23 NOTE — Progress Notes (Signed)
Subjective:    Patient ID: David [name], male    DOB: Apr 10, 1956, 60 y.o.   MRN: [id number]  David [name] is a 60 y.o. male presenting on 06/23/2016 for Gout (can't sleep at night onset 4 month)  HPI   Chronic Pain, in setting of Cancer / H/o GOUT: - Last visit with me 04/20/16 see discussion on chronic pain, generalized involving mid and low back, shoulders, most joints, pain worsened since onset of cancer, worse at night. He was initially treated with Tramadol for pain, however concern with polysubstance abuse and positive cocaine on UDS, he admits to using he was notified 04/23/16 that we would not longer be able to prescribe narcotics including Tramadol for him, also he has been denied from chronic pain management [facility]. - Today he reports concern with chronic pain in both shoulders for many years, had problem with Right shoulder >20 years ago and received steroid injection with significant improvement, today interested in a left shoulder injection, he has had limited range of motion with shoulders limiting his function due to pain. Difficulty sleeping at night with worsening pain. - Takes Meloxicam 7.'5mg'$  with relief of some arthritis and joint pains, requesting to go up to '15mg'$  daily, he was advised this by another provider - Denies any red swollen joint, new injury or fall or trauma, numbness, tingling weakness  Small Cell Lung Cancer (R), without evidence of metastatic disease: - Patient is followed by On[facility], Dr Grayland Ormond, he has been treated with chemotherapy and radiation, last 03/2016. Additionally has COPD followed by Pulmonology - Today reports he has upcoming lung biopsy scheduled for December 2017, it was re-scheduled  TOBACCO ABUSE, Chronic / Polysubstance abuse - Active smoker up to 2ppd, >53 years. Has never fully quit before, has attempted using NRT patches, unsuccessful. He admits that he is not ready to quit smoking and plans to continue smoking  Review of Systems     Per HPI unless specifically indicated above     Objective:    BP 102/66   Pulse 89   Temp 97.8 F (36.6 C) (Oral)   Resp 16   Ht '5\' 9"'$  (1.753 m)   Wt 125 lb (56.7 kg)   BMI 18.46 kg/m   Wt Readings from Last 3 Encounters:  06/23/16 125 lb (56.7 kg)  06/16/16 124 lb (56.2 kg)  05/21/16 125 lb (56.7 kg)     Physical Exam  Constitutional: He appears well-developed and well-nourished. No distress.  Chronically ill, thin and cachectic, and appears older than stated age. Currently mild discomfort with left shoulder pain, cooperative.  HENT:  Head: Normocephalic and atraumatic.  Mouth/Throat: Oropharynx is clear and moist.  Cardiovascular: Normal rate and intact distal pulses.   No murmur heard. Pulmonary/Chest: Effort normal.  Musculoskeletal: He exhibits no edema.  Bilateral Shoulders Inspection: Thin with muscle atrophy appearance bilateral symmetrical Palpation: Mild tender anterior lateral and posterior without localized tender spot, AC non tender ROM: Significantly limited bilateral ROM, Left worse than Right. Only up to 90* forward flexion not above shoulder bilateral, limited 45* abduction, intact left internal rotation. Special Testing: Rotator cuff testing positive for some weakness with pain generally bilateral, both full and empty can supraspinatus testing. Positive Hawkin's impingement test Left Strength: Normal strength 5/5 grip, rotator cuff testing limited by pain and ROM Neurovascular: Distally intact pulses, sensation to light touch  Neurological: He is alert.  Gait slow but stable without assistance  Skin: Skin is warm and dry. He is  not diaphoretic.  Psychiatric: His behavior is normal.  Nursing note and vitals reviewed.  ________________________________________________________ PROCEDURE NOTE Date: 06/23/16 Left Shoulder subacromial injection Discussed benefits and risks (including pain, bleeding, infection, steroid flare). Verbal consent given by  patient. Medication:  1 cc Depo-medrol '40mg'$  and 4 cc Lidocaine 1% without epi Time Out taken  Landmarks identified. Area cleansed with alcohol wipes.Using 21 gauge and 1, 1/2 inch needle, Left subacromial bursa space was injected (with above listed medication) via posterior approach cold spray used for superficial anesthetic.Sterile bandage placed.Patient tolerated procedure well without bleeding or paresthesias.No complications.     Assessment & Plan:   Problem List Items Addressed This Visit    Tobacco abuse   Primary osteoarthritis involving multiple joints    Multiple joints involved bilateral shoulders, now acutely L shoulder, multiple levels in spine - Increase meloxicam 7.5 to '15mg'$  daily Follow-up      Relevant Medications   methylPREDNISolone acetate (DEPO-MEDROL) injection 40 mg (Completed)   meloxicam (MOBIC) 15 MG tablet   Polysubstance abuse    Since last visit 04/2016 with positive UDS cocaine, patient states he has tried to reduce using substances, and no longer associating with friends who used, he is seeking help for substance addiction in community. - No further request for any opiate medications - See treatment plan above, A&P      Chronic pain of both shoulders    Secondary to arthritis, DJD, and now with L bursitis      Relevant Medications   meloxicam (MOBIC) 15 MG tablet   Chronic pain due to neoplasm    Treat underlying chronic pain with non narcotic medications at this time, given history of substance abuse with cocaine positive, see last discussion 04/2016 after UDS result. Denied from [facility]. He understands no further refills Tramadol at this time from our office. - Increase Meloxicam - Follow-up in future consider alternative meds such as baclofen, cymbalta, gabapentin      Chronic bursitis of left shoulder - Primary    Consistent with subacute on chronic Left shoulder bursitis vs rotator cuff tendinopathy with some reduced active ROM,  possibly degenerative tearing but no acute shoulder weakness. 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 - No clear etiology of injury, but known arthritis underlying. Not consistent with gout. - No recent imaging. Last R shoulder 02/2016 without arthropathy  Plan: 1. Left shoulder subacromial steroid injection today, see procedure note 2. Increase Meloxicam from 7.5 to '15mg'$  daily, refill sent 3. May take Tylenol Ex Str 1-2 q 6 hr PRN 4. Relative rest but keep shoulder mobile, demonstrated ROM exercises, avoid heavy lifting 5. May try heating pad PRN 6. RTC 1 month re-evaluation, if not improved consider X-rays eval for arthritis, may consider baclofen muscle relaxer if hard to sleep at night, patient is not good candidate for any aggressive interventions or surgical fix if soft tissue tear given co morbidities, cancer, and frailty       Relevant Medications   methylPREDNISolone acetate (DEPO-MEDROL) injection 40 mg (Completed)   lidocaine (PF) (XYLOCAINE) 1 % injection 4 mL (Completed)      Meds ordered this encounter  Medications  . methylPREDNISolone acetate (DEPO-MEDROL) injection 40 mg  . lidocaine (PF) (XYLOCAINE) 1 % injection 4 mL  . meloxicam (MOBIC) 15 MG tablet    Sig: Take 1 tablet (15 mg total) by mouth every morning.    Dispense:  30 tablet    Refill:  3  Follow up plan: Return in about 6 weeks (around 08/04/2016), or if symptoms worsen or fail to improve, for pain.  Nobie Putnam, [facility] 06/23/2016, 1:18 PM

## 2016-06-23 NOTE — Patient Instructions (Signed)
Thank you for coming in to clinic today.  1. For Shoulder you most likely have Burisitis inflammation from arthritis. - Steroid injection, take it easy for 24 hours, it may get more sore overnight, but then should improve after 1-2 days, hopefully it lasts several months - take Meloxicam '15mg'$  daily with food every day - Recommend to start taking Tylenol Extra Strength '500mg'$  tabs - take 1 to 2 tabs (max '1000mg'$  per dose) every 6 hours for pain (take regularly, don't skip a dose for next 3-7 days), max 24 hour daily dose is 6 to 8 tablets or 3000 to '4000mg'$  - use heating pad and ice as needed  Range of Motion Shoulder Exercises  Pendulum Circles - Lean with your good arm against a counter or table for support - Bend forward with a wide stance (make sure your body is comfortable) - Your painful shoulder should hang down and feel "heavy" - Gently move your painful arm in small circles "clockwise" for several turns - Switch to "counterclockwise" for several turns - Early on keep circles narrow and move slowly - Later in rehab, move in larger circles and faster movement   Wall Crawl - Stand close (about 1-2 ft away) to a wall, facing it directly - Reach out with your arm of painful shoulder and place fingers (not palm) on wall - You should make contact with wall at your waist level - Slowly walk your fingers up the wall. Stay in contact with wall entire time, do not remove fingers - Keep walking fingers up wall until you reach shoulder level - You may feel tightening or mild discomfort, once you reach a height that causes pain or if you are already above your shoulder height then stop. Repeat from starting position. - Early on stand closer to wall, move fingers slowly, and stay at or below shoulder level - Later in rehab, stand farther away from wall (fingertips), move fingers quicker, go above shoulder level    Please schedule a follow-up appointment with Dr. Parks Ranger in 6 weeks as needed  for pain  If you have any other questions or concerns, please feel free to call the clinic or send a message through [place]. You may also schedule an earlier appointment if necessary.  Nobie Putnam, DO [place]

## 2016-06-23 NOTE — Assessment & Plan Note (Signed)
Secondary to arthritis, DJD, and now with L bursitis

## 2016-06-23 NOTE — Assessment & Plan Note (Signed)
Multiple joints involved bilateral shoulders, now acutely L shoulder, multiple levels in spine - Increase meloxicam 7.5 to '15mg'$  daily Follow-up

## 2016-06-30 ENCOUNTER — Telehealth: Payer: Self-pay | Admitting: *Deleted

## 2016-06-30 DIAGNOSIS — J449 Chronic obstructive pulmonary disease, unspecified: Secondary | ICD-10-CM

## 2016-06-30 NOTE — Telephone Encounter (Signed)
Tried to call pt on cell number but it states not accepting any calls and no VM. LM with daughter asking her to ask pt to give me a call back in regards to ONO results.

## 2016-07-05 NOTE — Telephone Encounter (Signed)
Spoke with pt and gave him his ONO resutls. Will place order for 1L O2 QHS per DK. Nothing further needed.

## 2016-07-05 NOTE — Addendum Note (Signed)
Addended by: Oscar La R on: 07/05/2016 11:11 AM   Modules accepted: Orders

## 2016-07-05 NOTE — Telephone Encounter (Signed)
Patient says someone has been trying to get in touch with him.  Waiting in lobby.

## 2016-07-09 ENCOUNTER — Ambulatory Visit: Payer: Medicaid Other | Admitting: Family Medicine

## 2016-07-15 ENCOUNTER — Encounter
Admission: RE | Admit: 2016-07-15 | Discharge: 2016-07-15 | Disposition: A | Discharge status: Home / Self Care | Payer: Medicaid Other | Source: Ambulatory Visit | Attending: Internal Medicine | Admitting: Internal Medicine

## 2016-07-15 DIAGNOSIS — R911 Solitary pulmonary nodule: Secondary | ICD-10-CM | POA: Diagnosis present

## 2016-07-15 DIAGNOSIS — Z01812 Encounter for preprocedural laboratory examination: Secondary | ICD-10-CM | POA: Insufficient documentation

## 2016-07-15 HISTORY — DX: Unspecified osteoarthritis, unspecified site: M19.90

## 2016-07-15 HISTORY — DX: Personal history of urinary calculi: Z87.442

## 2016-07-15 HISTORY — DX: Hypoxemia: R09.02

## 2016-07-15 HISTORY — DX: Pain, unspecified: R52

## 2016-07-15 HISTORY — DX: Gout, unspecified: M10.9

## 2016-07-15 NOTE — Patient Instructions (Signed)
  Your procedure is scheduled on: 07/22/16 Report to Day Surgery. [facility]OND FLOOR To find out your arrival time please call [phone] between 1PM - 3PM on 07/21/16.  Remember: Instructions that are not followed completely may result in serious medical risk, up to and including death, or upon the discretion of your surgeon and anesthesiologist your surgery may need to be rescheduled.    _X__ 1. Do not eat food or drink liquids after midnight. No gum chewing or hard candies.     ____ 2. No Alcohol for 24 hours before or after surgery.   _X___ 3. Do Not Smoke For 24 Hours Prior to Your Surgery.   ____ 4. Bring all medications with you on the day of surgery if instructed.    _X___ 5. Notify your doctor if there is any change in your medical condition     (cold, fever, infections).       Do not wear jewelry, make-up, hairpins, clips or nail polish.  Do not wear lotions, powders, or perfumes. You may wear deodorant.  Do not shave 48 hours prior to surgery. Men may shave face and neck.  Do not bring valuables to the hospital.    [facility] is not responsible for any belongings or valuables.               Contacts, dentures or bridgework may not be worn into surgery.  Leave your suitcase in the car. After surgery it may be brought to your room.  For patients admitted to the hospital, discharge time is determined by your                treatment team.   Patients discharged the day of surgery will not be allowed to drive home.     ____ Take these medicines the morning of surgery with A SIP OF WATER:    1.   2.   3.   4.  5.  6.  ____ Fleet Enema (as directed)   ____ Use CHG Soap as directed  __X__ Use inhalers on the day of surgery  ____ Stop metformin 2 days prior to surgery    ____ Take 1/2 of usual insulin dose the night before surgery and none on the morning of surgery.   ____ Stop Coumadin/Plavix/aspirin on  ____ Stop Anti-inflammatories on                                   ALREADY STOPPED BC POWDERS/ MOBIC/ASPIRIN   ____ Stop supplements until after surgery.    ____ Bring C-Pap to the hospital.

## 2016-07-18 ENCOUNTER — Emergency Department: Payer: Medicaid Other

## 2016-07-18 ENCOUNTER — Other Ambulatory Visit: Payer: Self-pay

## 2016-07-18 ENCOUNTER — Observation Stay
Admission: EM | Admit: 2016-07-18 | Discharge: 2016-07-19 | Disposition: A | Discharge status: Home / Self Care | Payer: Medicaid Other | Attending: Internal Medicine | Admitting: Internal Medicine

## 2016-07-18 DIAGNOSIS — C3490 Malignant neoplasm of unspecified part of unspecified bronchus or lung: Secondary | ICD-10-CM | POA: Diagnosis present

## 2016-07-18 DIAGNOSIS — I700 Atherosclerosis of aorta: Secondary | ICD-10-CM | POA: Insufficient documentation

## 2016-07-18 DIAGNOSIS — Z885 Allergy status to narcotic agent status: Secondary | ICD-10-CM | POA: Insufficient documentation

## 2016-07-18 DIAGNOSIS — M1990 Unspecified osteoarthritis, unspecified site: Secondary | ICD-10-CM | POA: Insufficient documentation

## 2016-07-18 DIAGNOSIS — J439 Emphysema, unspecified: Secondary | ICD-10-CM | POA: Insufficient documentation

## 2016-07-18 DIAGNOSIS — Z7951 Long term (current) use of inhaled steroids: Secondary | ICD-10-CM | POA: Diagnosis not present

## 2016-07-18 DIAGNOSIS — R918 Other nonspecific abnormal finding of lung field: Secondary | ICD-10-CM

## 2016-07-18 DIAGNOSIS — Z79899 Other long term (current) drug therapy: Secondary | ICD-10-CM | POA: Insufficient documentation

## 2016-07-18 DIAGNOSIS — F17210 Nicotine dependence, cigarettes, uncomplicated: Secondary | ICD-10-CM | POA: Diagnosis not present

## 2016-07-18 DIAGNOSIS — I313 Pericardial effusion (noninflammatory): Secondary | ICD-10-CM | POA: Diagnosis not present

## 2016-07-18 DIAGNOSIS — Z85820 Personal history of malignant melanoma of skin: Secondary | ICD-10-CM | POA: Insufficient documentation

## 2016-07-18 DIAGNOSIS — F4320 Adjustment disorder, unspecified: Secondary | ICD-10-CM | POA: Insufficient documentation

## 2016-07-18 DIAGNOSIS — M7552 Bursitis of left shoulder: Secondary | ICD-10-CM | POA: Diagnosis not present

## 2016-07-18 DIAGNOSIS — R0789 Other chest pain: Secondary | ICD-10-CM | POA: Diagnosis not present

## 2016-07-18 DIAGNOSIS — I10 Essential (primary) hypertension: Secondary | ICD-10-CM | POA: Diagnosis not present

## 2016-07-18 DIAGNOSIS — Z9221 Personal history of antineoplastic chemotherapy: Secondary | ICD-10-CM | POA: Diagnosis not present

## 2016-07-18 DIAGNOSIS — Z886 Allergy status to analgesic agent status: Secondary | ICD-10-CM | POA: Insufficient documentation

## 2016-07-18 DIAGNOSIS — Z87442 Personal history of urinary calculi: Secondary | ICD-10-CM | POA: Insufficient documentation

## 2016-07-18 DIAGNOSIS — M150 Primary generalized (osteo)arthritis: Secondary | ICD-10-CM | POA: Diagnosis present

## 2016-07-18 DIAGNOSIS — M159 Polyosteoarthritis, unspecified: Secondary | ICD-10-CM | POA: Diagnosis present

## 2016-07-18 DIAGNOSIS — Z85118 Personal history of other malignant neoplasm of bronchus and lung: Secondary | ICD-10-CM | POA: Insufficient documentation

## 2016-07-18 DIAGNOSIS — R079 Chest pain, unspecified: Secondary | ICD-10-CM | POA: Diagnosis present

## 2016-07-18 DIAGNOSIS — G4700 Insomnia, unspecified: Secondary | ICD-10-CM | POA: Insufficient documentation

## 2016-07-18 DIAGNOSIS — Z8249 Family history of ischemic heart disease and other diseases of the circulatory system: Secondary | ICD-10-CM | POA: Insufficient documentation

## 2016-07-18 DIAGNOSIS — F1490 Cocaine use, unspecified, uncomplicated: Secondary | ICD-10-CM | POA: Insufficient documentation

## 2016-07-18 DIAGNOSIS — R0902 Hypoxemia: Secondary | ICD-10-CM | POA: Insufficient documentation

## 2016-07-18 DIAGNOSIS — M109 Gout, unspecified: Secondary | ICD-10-CM | POA: Insufficient documentation

## 2016-07-18 DIAGNOSIS — G893 Neoplasm related pain (acute) (chronic): Secondary | ICD-10-CM | POA: Insufficient documentation

## 2016-07-18 DIAGNOSIS — D649 Anemia, unspecified: Secondary | ICD-10-CM

## 2016-07-18 DIAGNOSIS — Z8379 Family history of other diseases of the digestive system: Secondary | ICD-10-CM | POA: Insufficient documentation

## 2016-07-18 DIAGNOSIS — M1991 Primary osteoarthritis, unspecified site: Secondary | ICD-10-CM | POA: Diagnosis not present

## 2016-07-18 DIAGNOSIS — Z923 Personal history of irradiation: Secondary | ICD-10-CM | POA: Insufficient documentation

## 2016-07-18 DIAGNOSIS — J449 Chronic obstructive pulmonary disease, unspecified: Secondary | ICD-10-CM | POA: Diagnosis present

## 2016-07-18 DIAGNOSIS — Z801 Family history of malignant neoplasm of trachea, bronchus and lung: Secondary | ICD-10-CM | POA: Insufficient documentation

## 2016-07-18 LAB — TROPONIN I
Troponin I: <0.03 ng/mL (ref <0.03)
Troponin I: <0.03 ng/mL (ref <0.03)

## 2016-07-18 LAB — URINE DRUG SCREEN, QUALITATIVE (ARMC ONLY)
Amphetamines, Ur Screen: NOT DETECTED
Barbiturates, Ur Screen: NOT DETECTED
Benzodiazepine, Ur Scrn: NOT DETECTED
CANNABINOID 50 NG, UR ~~LOC~~: NOT DETECTED
COCAINE METABOLITE, UR ~~LOC~~: NOT DETECTED
MDMA (Ecstasy)Ur Screen: NOT DETECTED
Methadone Scn, Ur: NOT DETECTED
OPIATE, UR SCREEN: POSITIVE — AB
Phencyclidine (PCP) Ur S: NOT DETECTED
TRICYCLIC, UR SCREEN: NOT DETECTED

## 2016-07-18 LAB — BASIC METABOLIC PANEL
ANION GAP: 6 (ref 5–15)
BUN: 9 mg/dL (ref 6–20)
CHLORIDE: 104 mmol/L (ref 101–111)
CO2: 29 mmol/L (ref 22–32)
CREATININE: 0.75 mg/dL (ref 0.61–1.24)
Calcium: 8.9 mg/dL (ref 8.9–10.3)
GFR calc non Af Amer: >60 mL/min (ref >60)
Glucose, Bld: 103 mg/dL — ABNORMAL HIGH (ref 65–99)
POTASSIUM: 3.7 mmol/L (ref 3.5–5.1)
SODIUM: 139 mmol/L (ref 135–145)

## 2016-07-18 LAB — CBC
HEMATOCRIT: 33.3 % — AB (ref 40.0–52.0)
HEMOGLOBIN: 11.2 g/dL — AB (ref 13.0–18.0)
MCH: 30.1 pg (ref 26.0–34.0)
MCHC: 33.5 g/dL (ref 32.0–36.0)
MCV: 89.8 fL (ref 80.0–100.0)
Platelets: 311 10*3/uL (ref 150–440)
RBC: 3.71 MIL/uL — AB (ref 4.40–5.90)
RDW: 13.6 % (ref 11.5–14.5)
WBC: 5.7 10*3/uL (ref 3.8–10.6)

## 2016-07-18 MED ORDER — TIOTROPIUM BROMIDE MONOHYDRATE 18 MCG IN CAPS
18.0000 ug | ORAL_CAPSULE | Freq: Every day | RESPIRATORY_TRACT | Status: DC
  Administered 2016-07-18 – 2016-07-19 (×2): 18 ug via RESPIRATORY_TRACT
  Filled 2016-07-18: qty 5

## 2016-07-18 MED ORDER — LACTATED RINGERS IV SOLN: INTRAVENOUS | Status: DC

## 2016-07-18 MED ORDER — ACETAMINOPHEN 325 MG PO TABS: 650.0000 mg | ORAL_TABLET | Freq: Four times a day (QID) | ORAL | Status: DC | PRN

## 2016-07-18 MED ORDER — FAMOTIDINE 20 MG PO TABS
20.0000 mg | ORAL_TABLET | Freq: Once | ORAL | Status: AC
  Administered 2016-07-18: 20 mg via ORAL
  Filled 2016-07-18: qty 1

## 2016-07-18 MED ORDER — SODIUM CHLORIDE 0.9% FLUSH
3.0000 mL | Freq: Two times a day (BID) | INTRAVENOUS | Status: DC
  Administered 2016-07-18: 3 mL via INTRAVENOUS

## 2016-07-18 MED ORDER — MOMETASONE FURO-FORMOTEROL FUM 200-5 MCG/ACT IN AERO
2.0000 | INHALATION_SPRAY | Freq: Two times a day (BID) | RESPIRATORY_TRACT | Status: DC
  Administered 2016-07-18 – 2016-07-19 (×2): 2 via RESPIRATORY_TRACT
  Filled 2016-07-18: qty 8.8

## 2016-07-18 MED ORDER — ASPIRIN 81 MG PO CHEW
324.0000 mg | CHEWABLE_TABLET | Freq: Once | ORAL | Status: AC
  Administered 2016-07-18: 324 mg via ORAL
  Filled 2016-07-18: qty 4

## 2016-07-18 MED ORDER — HEPARIN SODIUM (PORCINE) 5000 UNIT/ML IJ SOLN
5000.0000 [IU] | Freq: Three times a day (TID) | INTRAMUSCULAR | Status: DC
  Administered 2016-07-18 – 2016-07-19 (×4): 5000 [IU] via SUBCUTANEOUS
  Filled 2016-07-18 (×4): qty 1

## 2016-07-18 MED ORDER — ALBUTEROL SULFATE (2.5 MG/3ML) 0.083% IN NEBU: 2.5000 mg | INHALATION_SOLUTION | Freq: Four times a day (QID) | RESPIRATORY_TRACT | Status: DC | PRN

## 2016-07-18 MED ORDER — ONDANSETRON HCL 4 MG/2ML IJ SOLN: 4.0000 mg | Freq: Four times a day (QID) | INTRAMUSCULAR | Status: DC | PRN

## 2016-07-18 MED ORDER — OXYCODONE HCL 5 MG PO TABS: 5.0000 mg | ORAL_TABLET | Freq: Three times a day (TID) | ORAL | Status: DC

## 2016-07-18 MED ORDER — ASPIRIN EC 81 MG PO TBEC
81.0000 mg | DELAYED_RELEASE_TABLET | Freq: Every day | ORAL | Status: DC
  Administered 2016-07-18 – 2016-07-19 (×2): 81 mg via ORAL
  Filled 2016-07-18 (×2): qty 1

## 2016-07-18 MED ORDER — OXYCODONE HCL 5 MG PO TABS
10.0000 mg | ORAL_TABLET | Freq: Three times a day (TID) | ORAL | Status: DC
  Administered 2016-07-18 – 2016-07-19 (×4): 10 mg via ORAL
  Filled 2016-07-18 (×4): qty 2

## 2016-07-18 MED ORDER — PANTOPRAZOLE SODIUM 40 MG PO TBEC
40.0000 mg | DELAYED_RELEASE_TABLET | Freq: Every day | ORAL | Status: DC
  Administered 2016-07-18 – 2016-07-19 (×2): 40 mg via ORAL
  Filled 2016-07-18 (×2): qty 1

## 2016-07-18 MED ORDER — DOCUSATE SODIUM 100 MG PO CAPS
100.0000 mg | ORAL_CAPSULE | Freq: Two times a day (BID) | ORAL | Status: DC
  Administered 2016-07-18 (×2): 100 mg via ORAL
  Filled 2016-07-18 (×3): qty 1

## 2016-07-18 MED ORDER — IOPAMIDOL (ISOVUE-370) INJECTION 76%
75.0000 mL | Freq: Once | INTRAVENOUS | Status: AC | PRN
  Administered 2016-07-18: 75 mL via INTRAVENOUS

## 2016-07-18 MED ORDER — BISACODYL 10 MG RE SUPP: 10.0000 mg | Freq: Every day | RECTAL | Status: DC | PRN

## 2016-07-18 MED ORDER — MORPHINE SULFATE (PF) 4 MG/ML IV SOLN
4.0000 mg | Freq: Once | INTRAVENOUS | Status: AC
  Administered 2016-07-18: 4 mg via INTRAVENOUS
  Filled 2016-07-18: qty 1

## 2016-07-18 MED ORDER — ONDANSETRON HCL 4 MG PO TABS: 4.0000 mg | ORAL_TABLET | Freq: Four times a day (QID) | ORAL | Status: DC | PRN

## 2016-07-18 MED ORDER — SODIUM CHLORIDE 0.9 % IV SOLN
INTRAVENOUS | Status: DC
  Administered 2016-07-18: 18:00:00 via INTRAVENOUS

## 2016-07-18 MED ORDER — ALBUTEROL SULFATE HFA 108 (90 BASE) MCG/ACT IN AERS: 2.0000 | INHALATION_SPRAY | Freq: Four times a day (QID) | RESPIRATORY_TRACT | Status: DC | PRN

## 2016-07-18 MED ORDER — MORPHINE SULFATE (PF) 4 MG/ML IV SOLN
2.0000 mg | INTRAVENOUS | Status: DC | PRN
  Administered 2016-07-19 (×5): 2 mg via INTRAVENOUS
  Filled 2016-07-18 (×5): qty 1

## 2016-07-18 MED ORDER — ONDANSETRON HCL 4 MG/2ML IJ SOLN
4.0000 mg | Freq: Once | INTRAMUSCULAR | Status: AC
  Administered 2016-07-18: 4 mg via INTRAVENOUS
  Filled 2016-07-18: qty 2

## 2016-07-18 MED ORDER — NICOTINE 21 MG/24HR TD PT24
21.0000 mg | MEDICATED_PATCH | Freq: Every day | TRANSDERMAL | Status: DC
  Administered 2016-07-19: 21 mg via TRANSDERMAL
  Filled 2016-07-18: qty 1

## 2016-07-18 MED ORDER — ACETAMINOPHEN 650 MG RE SUPP: 650.0000 mg | Freq: Four times a day (QID) | RECTAL | Status: DC | PRN

## 2016-07-18 MED ORDER — NITROGLYCERIN 0.4 MG SL SUBL: 0.4000 mg | SUBLINGUAL_TABLET | SUBLINGUAL | Status: DC | PRN

## 2016-07-18 NOTE — ED Provider Notes (Signed)
[facility] Emergency Department Provider Note ____________________________________________   I have reviewed the triage vital signs and the triage nursing note.  HISTORY  Chief Complaint Chest Pain   Historian Patient, spouse and daughter  HPI David [name] is a 60 y.o. male here for evaluation of episode of 45 minutes of chest pain this morning associated with shortness of breath. Located central and left lower chest wall. Somewhat worse with breathing. Chest pain is mostly resolved, but still there slightly. States he had an egg for breakfast as he does most mornings. After chest pain started he had to have a bowel movement and this was a diarrheal bowel movement.  Denies abdominal pain. Denies coughing.  He has a history of small cell lung cancer which was treated with chemotherapy and radiation, and states recently he was told that there is a new tumor in the same location, but he has not had a biopsy to confirm same type cancer.    Past Medical History:  Diagnosis Date  . Arthritis   . Asthma   . COPD (chronic obstructive pulmonary disease) (HCC)    not on home o2  . Gout   . History of kidney stones   . History of melanoma   . Hypertension   . Oxygen deficiency    O2 1 LITER  [facility]  . Pain    chronic pain all over  . Small cell lung cancer (HCC)    melanoma  . Tobacco use     Patient Active Problem List   Diagnosis Date Noted  . Primary osteoarthritis involving multiple joints 06/23/2016  . Chronic bursitis of left shoulder 06/23/2016  . Chronic pain of both shoulders 06/23/2016  . Polysubstance abuse 06/23/2016  . Tobacco abuse 04/20/2016  . History of melanoma 04/20/2016  . COPD (chronic obstructive pulmonary disease) ([place]) 04/20/2016  . Gout 04/20/2016  . Adjustment disorder 04/20/2016  . Insomnia 04/20/2016  . Chronic pain due to neoplasm 04/20/2016  . Opiate use 04/20/2016  . Small cell lung cancer ([place]) 09/08/2015    Past  Surgical History:  Procedure Laterality Date  . PERIPHERAL VASCULAR CATHETERIZATION N/A 09/15/2015   Procedure: Glori Luis Cath Insertion;  Surgeon: Algernon Huxley, MD;  Location: [place] [facility];  Service: Cardiovascular;  Laterality: N/A;  . SKIN GRAFT     [facility]    Prior to Admission medications   Medication Sig Start Date End Date Taking? Authorizing Provider  albuterol (PROVENTIL HFA;VENTOLIN HFA) 108 (90 Base) MCG/ACT inhaler Inhale 2 puffs into the lungs every 4 (four) hours as needed for wheezing or shortness of breath. 06/16/16  Yes Flora Lipps, MD  ibuprofen (ADVIL,MOTRIN) 200 MG tablet Take 800 mg by mouth every 8 (eight) hours.   Yes Historical Provider, MD  meloxicam (MOBIC) 15 MG tablet Take 1 tablet (15 mg total) by mouth every morning. Patient taking differently: Take 15 mg by mouth daily as needed for pain.  06/23/16  Yes Alexander Devin Going, DO  oxyCODONE (OXY IR/ROXICODONE) 5 MG immediate release tablet Take 5 mg by mouth 3 (three) times daily.   Yes Historical Provider, MD  tiotropium (SPIRIVA) 18 MCG inhalation capsule Place 1 capsule (18 mcg total) into inhaler and inhale daily. 06/16/16  Yes Flora Lipps, MD  albuterol (PROVENTIL HFA;VENTOLIN HFA) 108 (90 Base) MCG/ACT inhaler Inhale 2 puffs into the lungs every 6 (six) hours as needed for wheezing or shortness of breath. Patient not taking: Reported on 07/12/2016 04/20/16   Devonne Doughty  Karamalegos, DO  Fluticasone-Salmeterol (ADVAIR DISKUS) 250-50 MCG/DOSE AEPB Inhale 1 puff into the lungs 2 (two) times daily. Patient not taking: Reported on 07/18/2016 06/16/16 06/16/17  Flora Lipps, MD    Allergies  Allergen Reactions  . Tylenol [Acetaminophen] Other (See Comments)    Unable to take due to chemotherapy regimen  . Vicodin [Hydrocodone-Acetaminophen] Nausea And Vomiting and Rash    Family History  Problem Relation Age of Onset  . Lung cancer Mother   . Heart attack Mother 61  . Cirrhosis Father 57    Social  History Social History  Substance Use Topics  . Smoking status: Current Every Day Smoker    Packs/day: 1.00    Years: 51.00    Types: Cigarettes    Start date: 08/12/1994  . Smokeless tobacco: Never Used     Comment: used to smoke upto 2PPD, recently cut down  . Alcohol use No    Review of Systems  Constitutional: Negative for fever. Eyes: Negative for visual changes. ENT: Negative for sore throat. Cardiovascular: Positive for chest pain as per hpi. Respiratory: No ongoing shortness of breath, but positive for shortness of breath during bad chest pain episode as per hpi. Gastrointestinal: Negative for abdominal pain,, vomiting.  One diarrhea BM. Genitourinary: Negative for dysuria. Musculoskeletal: Negative for back pain.  Arthritis of the hands which are more painful over the past few weeks.  Knees also have been painful too. Skin: Negative for rash. Neurological: Negative for headache. 10 point Review of Systems otherwise negative ____________________________________________   PHYSICAL EXAM:  VITAL SIGNS: ED Triage Vitals  Enc Vitals Group     BP 07/18/16 1030 126/69     Pulse Rate 07/18/16 1030 88     Resp 07/18/16 1030 18     Temp 07/18/16 1030 97.9 F (36.6 C)     Temp Source 07/18/16 1030 Oral     SpO2 07/18/16 1030 100 %     Weight 07/18/16 1032 136 lb (61.7 kg)     Height 07/18/16 1032 '5\' 9"'$  (1.753 m)     Head Circumference --      Peak Flow --      Pain Score 07/18/16 1032 5     Pain Loc --      Pain Edu? --      Excl. in [place]? --      Constitutional: Alert and oriented. Well appearing and in no distress. HEENT   Head: Normocephalic and atraumatic.      Eyes: Conjunctivae are normal. PERRL. Normal extraocular movements.      Ears:         Nose: No congestion/rhinnorhea.   Mouth/Throat: Mucous membranes are mildly dry.   Neck: No stridor. Cardiovascular/Chest: Normal rate, regular rhythm.  No murmurs, rubs, or gallops. Respiratory: Normal  respiratory effort without tachypnea nor retractions. Breath sounds are clear and equal bilaterally. No wheezes/rales/rhonchi. Gastrointestinal: Soft. No distention, no guarding, no rebound. Nontender.    Genitourinary/rectal:Deferred Musculoskeletal:  Finger joints of both hands arthritic and painful without redness. No lower extremity tenderness.  No edema. Neurologic:  Normal speech and language. No gross or focal neurologic deficits are appreciated. Skin:  Skin is warm, dry and intact. No rash noted. Psychiatric: Mood and affect are normal. Speech and behavior are normal. Patient exhibits appropriate insight and judgment.   ____________________________________________  LABS (pertinent positives/negatives)  Labs Reviewed  BASIC METABOLIC PANEL - Abnormal; Notable for the following:       Result Value   Glucose,  Bld 103 (*)    All other components within normal limits  CBC - Abnormal; Notable for the following:    RBC 3.71 (*)    Hemoglobin 11.2 (*)    HCT 33.3 (*)    All other components within normal limits  TROPONIN I    ____________________________________________    EKG I, Lisa Roca, MD, the attending physician have personally viewed and interpreted all ECGs.  83 bpm. Normal sinus rhythm. Narrow QRS. Normal axis. Normal ST and T-wave. ____________________________________________  RADIOLOGY All Xrays were viewed by me. Imaging interpreted by Radiologist.  Chest x-ray two-view:  IMPRESSION: 1. Stable examination without acute cardiopulmonary disease. 2. Grossly unchanged appearance of known bilateral upper lobe pulmonary nodules. 3.  Emphysema. (ICD10-J43.9) 4.  Aortic Atherosclerosis (ICD10-170.0)  CT chest with contrast for PE: IMPRESSION: No evidence pulmonary embolism.  Severe emphysematous and old granulomatous disease changes.  Stable RIGHT upper lobe nodular foci as well as additional stable BILATERAL pulmonary nodules.  No acute  abnormalities.  Aortic atherosclerosis. __________________________________________  PROCEDURES  Procedure(s) performed: None  Critical Care performed: None  ____________________________________________   ED COURSE / ASSESSMENT AND PLAN  Pertinent labs & imaging results that were available during my care of the patient were reviewed by me and considered in my medical decision making (see chart for details).   Mr. Sells had an episode of chest pain today with some associated shortness of breath, happened after he ate an egg, and then had diarrhea, so it is possible this was related to GI, but patient states that he has had some ongoing left-sided chest pain that feels worse with breathing. EKG is reassuring and initial troponin is normal. He has not had cardiac disease, but denies having had a cardiac workup including stress test in the past. I think it's worthwhile to consider further evaluation for him.  Additionally with the pleuritic component of the chest discomfort with the shortness of breath and the fact that he has recurrent small cell lung cancer which has not been completely rediagnosed with biopsy yet, I did discuss with the family risk of PE and chose to proceed with CT for pulmonary embolism.  Ultimately given pain, and need for cardiac evaluation, I discussed inpatient or outpatient workup with patient and family and they would prefer inpatient evaluation for cardiac rule out.  They did tell me that one point about 2 weeks ago he had some rectal bleeding, but since then he has not had any black or bloody stools. His hemoglobin does appear to have dropped since the summer.  CT reviewed - no PE.  Discussed with hospitalist for admission for nonspecific chest pain.  CONSULTATIONS:   Hospitalist for admission.   Patient / Family / Caregiver informed of clinical course, medical decision-making process, and agree with  plan.   ___________________________________________   FINAL CLINICAL IMPRESSION(S) / ED DIAGNOSES   Final diagnoses:  Nonspecific chest pain              Note: This dictation was prepared with Dragon dictation. Any transcriptional errors that result from this process are unintentional    Lisa Roca, MD 07/18/16 1322

## 2016-07-18 NOTE — ED Triage Notes (Signed)
Pt states he was sitting watching TV and sudden onset substernal chest pain about 46mn PTA..Marland KitchenDenies N/V/D/sob or diaphoresis.. Pt has small cell carcinoma

## 2016-07-18 NOTE — ED Notes (Signed)
Pt states he hasn't had morphine before but states his allergy to similar medications is ok. States he cane take the hydrocodone and he's ok. Will give medicine when pt returns from CT.   Taken to CT via stretcher.

## 2016-07-18 NOTE — H&P (Signed)
History and Physical    David [name] KPT:[id number] DOB: 1956/01/22 DOA: 07/18/2016  Referring physician: Dr. Reita Cliche PCP: Nobie Putnam, DO  Specialists: none  Chief Complaint: chest pain  HPI: LADAVION SAVITZ is a 60 y.o. male has a past medical history significant for COPD and lung cancer with chronic pain now with localized left chest pain associated with SOB and nausea. No exertional component  In ER, EKG and 1st troponin OK. He is now admitted. Denies cardiac hx. Still with localized CP in ER  Review of Systems: The patient denies anorexia, fever, weight loss,, vision loss, decreased hearing, hoarseness, syncope,  peripheral edema, balance deficits, hemoptysis, abdominal pain, melena, hematochezia, severe indigestion/heartburn, hematuria, incontinence, genital sores, muscle weakness, suspicious skin lesions, transient blindness, difficulty walking, depression, unusual weight change, abnormal bleeding, enlarged lymph nodes, angioedema, and breast masses.   Past Medical History:  Diagnosis Date  . Arthritis   . Asthma   . COPD (chronic obstructive pulmonary disease) (HCC)    not on home o2  . Gout   . History of kidney stones   . History of melanoma   . Hypertension   . Oxygen deficiency    O2 1 LITER [place] [facility]  . Pain    chronic pain all over  . Small cell lung cancer (HCC)    melanoma  . Tobacco use    Past Surgical History:  Procedure Laterality Date  . PERIPHERAL VASCULAR CATHETERIZATION N/A 09/15/2015   Procedure: Glori Luis Cath Insertion;  Surgeon: Algernon Huxley, MD;  Location: [place] [facility];  Service: Cardiovascular;  Laterality: N/A;  . SKIN GRAFT     [facility]   Social History:  reports that he has been smoking Cigarettes.  He started smoking about 21 years ago. He has a 51.00 pack-year smoking history. He has never used smokeless tobacco. He reports that he uses drugs, including Cocaine. He reports that he does not drink alcohol.  Allergies  Allergen Reactions   . Tylenol [Acetaminophen] Other (See Comments)    Unable to take due to chemotherapy regimen  . Vicodin [Hydrocodone-Acetaminophen] Nausea And Vomiting and Rash    Family History  Problem Relation Age of Onset  . Lung cancer Mother   . Heart attack Mother 51  . Cirrhosis Father 38    Prior to Admission medications   Medication Sig Start Date End Date Taking? Authorizing Provider  albuterol (PROVENTIL HFA;VENTOLIN HFA) 108 (90 Base) MCG/ACT inhaler Inhale 2 puffs into the lungs every 4 (four) hours as needed for wheezing or shortness of breath. 06/16/16  Yes Flora Lipps, MD  ibuprofen (ADVIL,MOTRIN) 200 MG tablet Take 800 mg by mouth every 8 (eight) hours.   Yes Historical Provider, MD  meloxicam (MOBIC) 15 MG tablet Take 1 tablet (15 mg total) by mouth every morning. Patient taking differently: Take 15 mg by mouth daily as needed for pain.  06/23/16  Yes Alexander Devin Going, DO  oxyCODONE (OXY IR/ROXICODONE) 5 MG immediate release tablet Take 5 mg by mouth 3 (three) times daily.   Yes Historical Provider, MD  tiotropium (SPIRIVA) 18 MCG inhalation capsule Place 1 capsule (18 mcg total) into inhaler and inhale daily. 06/16/16  Yes Flora Lipps, MD  albuterol (PROVENTIL HFA;VENTOLIN HFA) 108 (90 Base) MCG/ACT inhaler Inhale 2 puffs into the lungs every 6 (six) hours as needed for wheezing or shortness of breath. Patient not taking: Reported on 07/12/2016 04/20/16   Olin Hauser, DO  Fluticasone-Salmeterol (ADVAIR DISKUS) 250-50 MCG/DOSE AEPB  Inhale 1 puff into the lungs 2 (two) times daily. Patient not taking: Reported on 07/18/2016 06/16/16 06/16/17  Flora Lipps, MD   Physical Exam: Vitals:   07/18/16 1118 07/18/16 1209 07/18/16 1230 07/18/16 1328  BP: 100/71 125/68 113/65 118/77  Pulse: 82 69 64 73  Resp: '18 18 19 18  '$ Temp: 97.6 F (36.4 C)     TempSrc: Oral     SpO2: 98% 100% 99% 100%  Weight:      Height:         General:  No apparent distress, WDWN, Walker/AT  Eyes:  PERRL, EOMI, no scleral icterus, conjunctiva clear  ENT: moist oropharynx without exudate, TM's benign, dentition poor  Neck: supple, no lymphadenopathy. No bruits or thyromegaly  Cardiovascular: regular rate without MRG; 2+ peripheral pulses, no JVD, no peripheral edema  Respiratory: CTA biL, good air movement without wheezing, or rales. Scattered rhonchi noted. Respiratory effort normal  Abdomen: soft, non tender to palpation, positive bowel sounds, no guarding, no rebound  Skin: no rashes or lesions  Musculoskeletal: normal bulk and tone, no joint swelling  Psychiatric: normal mood and affect, A&OX3  Neurologic: CN 2-12 grossly intact, Motor strength 5/5 in all 4 groups with symmetric DTR's and non-focal sensory exam  Labs on Admission:  Basic Metabolic Panel:  Recent Labs Lab 07/18/16 1035  NA 139  K 3.7  CL 104  CO2 29  GLUCOSE 103*  BUN 9  CREATININE 0.75  CALCIUM 8.9   Liver Function Tests: No results for input(s): AST, ALT, ALKPHOS, BILITOT, PROT, ALBUMIN in the last 168 hours. No results for input(s): LIPASE, AMYLASE in the last 168 hours. No results for input(s): AMMONIA in the last 168 hours. CBC:  Recent Labs Lab 07/18/16 1035  WBC 5.7  HGB 11.2*  HCT 33.3*  MCV 89.8  PLT 311   Cardiac Enzymes:  Recent Labs Lab 07/18/16 1035  TROPONINI <0.03    BNP (last 3 results)  Recent Labs  10/01/15 1117 12/31/15 1233  BNP 44.0 322.0*    ProBNP (last 3 results) No results for input(s): PROBNP in the last 8760 hours.  CBG: No results for input(s): GLUCAP in the last 168 hours.  Radiological Exams on Admission: Dg Chest 2 View  Result Date: 07/18/2016 CLINICAL DATA:  Mid chest pain which began approximately 45 minutes ago. History of smoking. History of small-cell cancer. EXAM: CHEST  2 VIEW COMPARISON:  Chest CT - 05/11/2016; chest radiograph - 04/04/2016 FINDINGS: Grossly unchanged cardiac silhouette and mediastinal contours.  Atherosclerotic plaque within the thoracic aorta. Stable position of support apparatus. Known ill-defined pulmonary nodules within the bilateral upper lobes are unchanged. Unchanged asymmetric pleuroparenchymal thickening about the peripheral aspect the right upper lung. The lungs remain hyperexpanded with flattening the diaphragms and mild diffuse slightly nodular thickening of the pulmonary interstitium. No new focal airspace opacities. No pleural effusion or pneumothorax. No evidence of edema. No acute osseus abnormalities. IMPRESSION: 1. Stable examination without acute cardiopulmonary disease. 2. Grossly unchanged appearance of known bilateral upper lobe pulmonary nodules. 3.  Emphysema. (ICD10-J43.9) 4.  Aortic Atherosclerosis (ICD10-170.0) Electronically Signed   By: Sandi Mariscal M.D.   On: 07/18/2016 11:05   Ct Angio Chest Pe W/cm &/or Wo Cm  Result Date: 07/18/2016 CLINICAL DATA:  Sudden onset of substernal and pleuritic LEFT chest pain 45 minutes ago while watching television, history small cell lung carcinoma, smoking, melanoma, hypertension, asthma EXAM: CT ANGIOGRAPHY CHEST WITH CONTRAST TECHNIQUE: Multidetector CT imaging of the chest was  performed using the standard protocol during bolus administration of intravenous contrast. Multiplanar CT image reconstructions and MIPs were obtained to evaluate the vascular anatomy. CONTRAST:  75 cc Isovue 370 IV COMPARISON:  05/11/2016 CT chest FINDINGS: Cardiovascular: Scattered atherosclerotic calcifications aorta, upper normal caliber 3.8 cm diameter. No definite evidence of aneurysm or dissection. Pulmonary arteries well opacified and patent. No evidence of pulmonary embolism. Minimal pericardial effusion. Mediastinum/Nodes: Esophagus unremarkable. Few normal size mediastinal nodes without definite thoracic adenopathy. Number of LEFT axillary nodes are identified largest 11 mm short axis. Visualized base of cervical region normal appearance Lungs/Pleura:  Severe emphysematous changes diffusely. Spiculated nodular density RIGHT upper lobe 17 x 12 mm image 42 previously 17 x 13 mm. Adjacent areas of nodularity in the RIGHT upper lobe measure 17 x 8 mm image 43 previously 19 x 7 mm and 15 x 7 mm image 44 previously 15 x 8 mm. Few scattered calcified pulmonary granulomata are identified bilaterally. A few additional scattered pulmonary nodules which are not definitively calcified are identified in both lungs appear stable as well. No definite enlarging or new pulmonary nodules identified. Upper Abdomen: No significant abnormalities Musculoskeletal: No osseous lesion seen. Review of the MIP images confirms the above findings. IMPRESSION: No evidence pulmonary embolism. Severe emphysematous and old granulomatous disease changes. Stable RIGHT upper lobe nodular foci as well as additional stable BILATERAL pulmonary nodules. No acute abnormalities. Aortic atherosclerosis. Electronically Signed   By: Lavonia Dana M.D.   On: 07/18/2016 12:59    EKG: Independently reviewed.  Assessment/Plan Principal Problem:   Chest pain Active Problems:   Small cell lung cancer (HCC)   COPD (chronic obstructive pulmonary disease) (HCC)   Primary osteoarthritis involving multiple joints   Will observe on telemetry and follow enzymes. Check echo. Proceed with myoview in AM if enzymes OK.  Diet: heart healthy Fluids: NS'@75'$   DVT Prophylaxis: SQ Heparin  Code Status: FULL  Family Communication: none  Disposition Plan: home  Time spent: 50 min

## 2016-07-19 ENCOUNTER — Encounter: Payer: Self-pay | Admitting: Radiology

## 2016-07-19 ENCOUNTER — Observation Stay (HOSPITAL_BASED_OUTPATIENT_CLINIC_OR_DEPARTMENT_OTHER)
Admit: 2016-07-19 | Discharge: 2016-07-19 | Disposition: A | Discharge status: Home / Self Care | Payer: Medicaid Other | Attending: Internal Medicine | Admitting: Internal Medicine

## 2016-07-19 ENCOUNTER — Observation Stay (HOSPITAL_BASED_OUTPATIENT_CLINIC_OR_DEPARTMENT_OTHER): Payer: Medicaid Other

## 2016-07-19 DIAGNOSIS — D649 Anemia, unspecified: Secondary | ICD-10-CM

## 2016-07-19 DIAGNOSIS — R918 Other nonspecific abnormal finding of lung field: Secondary | ICD-10-CM

## 2016-07-19 DIAGNOSIS — R079 Chest pain, unspecified: Secondary | ICD-10-CM

## 2016-07-19 LAB — NM MYOCAR MULTI W/SPECT W/WALL MOTION / EF
CHL CUP NUCLEAR SRS: 0
CHL CUP NUCLEAR SSS: 0
CSEPHR: 68 %
CSEPPHR: 110 {beats}/min
LV dias vol: 83 mL (ref 62–150)
LV sys vol: 36 mL
Rest HR: 72 {beats}/min
SDS: 0
TID: 0.94

## 2016-07-19 LAB — COMPREHENSIVE METABOLIC PANEL
ALBUMIN: 2.9 g/dL — AB (ref 3.5–5.0)
ALT: 9 U/L — ABNORMAL LOW (ref 17–63)
ANION GAP: 4 — AB (ref 5–15)
AST: 15 U/L (ref 15–41)
Alkaline Phosphatase: 48 U/L (ref 38–126)
BUN: 11 mg/dL (ref 6–20)
CHLORIDE: 107 mmol/L (ref 101–111)
CO2: 31 mmol/L (ref 22–32)
Calcium: 8.8 mg/dL — ABNORMAL LOW (ref 8.9–10.3)
Creatinine, Ser: 0.91 mg/dL (ref 0.61–1.24)
GFR calc Af Amer: >60 mL/min (ref >60)
GFR calc non Af Amer: >60 mL/min (ref >60)
GLUCOSE: 90 mg/dL (ref 65–99)
POTASSIUM: 4.1 mmol/L (ref 3.5–5.1)
SODIUM: 142 mmol/L (ref 135–145)
Total Protein: 6.2 g/dL — ABNORMAL LOW (ref 6.5–8.1)

## 2016-07-19 LAB — CBC
HEMATOCRIT: 29.3 % — AB (ref 40.0–52.0)
HEMOGLOBIN: 10 g/dL — AB (ref 13.0–18.0)
MCH: 30.6 pg (ref 26.0–34.0)
MCHC: 34.1 g/dL (ref 32.0–36.0)
MCV: 89.9 fL (ref 80.0–100.0)
Platelets: 276 10*3/uL (ref 150–440)
RBC: 3.26 MIL/uL — ABNORMAL LOW (ref 4.40–5.90)
RDW: 13.6 % (ref 11.5–14.5)
WBC: 4.8 10*3/uL (ref 3.8–10.6)

## 2016-07-19 LAB — ECHOCARDIOGRAM COMPLETE
Height: 69 in
WEIGHTICAEL: 2027.2 [oz_av]

## 2016-07-19 LAB — TROPONIN I: Troponin I: <0.03 ng/mL (ref <0.03)

## 2016-07-19 MED ORDER — OXYCODONE HCL 10 MG PO TABS: 10.0000 mg | ORAL_TABLET | Freq: Three times a day (TID) | ORAL | 0 refills | Status: DC

## 2016-07-19 MED ORDER — ENSURE ENLIVE PO LIQD
237.0000 mL | Freq: Three times a day (TID) | ORAL | Status: DC
  Administered 2016-07-19: 237 mL via ORAL

## 2016-07-19 MED ORDER — DOCUSATE SODIUM 100 MG PO CAPS: 100.0000 mg | ORAL_CAPSULE | Freq: Two times a day (BID) | ORAL | 0 refills | Status: DC

## 2016-07-19 MED ORDER — NICOTINE 21 MG/24HR TD PT24: 21.0000 mg | MEDICATED_PATCH | Freq: Every day | TRANSDERMAL | 0 refills | Status: DC

## 2016-07-19 MED ORDER — TECHNETIUM TC 99M TETROFOSMIN IV KIT
12.0000 | PACK | Freq: Once | INTRAVENOUS | Status: AC | PRN
  Administered 2016-07-19: 13.618 via INTRAVENOUS

## 2016-07-19 MED ORDER — PANTOPRAZOLE SODIUM 40 MG PO TBEC: 40.0000 mg | DELAYED_RELEASE_TABLET | Freq: Every day | ORAL | 5 refills | Status: DC

## 2016-07-19 MED ORDER — ENSURE ENLIVE PO LIQD: 237.0000 mL | Freq: Three times a day (TID) | ORAL | 12 refills | Status: DC

## 2016-07-19 MED ORDER — TECHNETIUM TC 99M TETROFOSMIN IV KIT
33.0000 | PACK | Freq: Once | INTRAVENOUS | Status: AC | PRN
  Administered 2016-07-19: 31.825 via INTRAVENOUS

## 2016-07-19 MED ORDER — REGADENOSON 0.4 MG/5ML IV SOLN
0.4000 mg | Freq: Once | INTRAVENOUS | Status: AC
  Administered 2016-07-19: 0.4 mg via INTRAVENOUS

## 2016-07-19 MED ORDER — HEPARIN SOD (PORK) LOCK FLUSH 100 UNIT/ML IV SOLN
500.0000 [IU] | Freq: Once | INTRAVENOUS | Status: DC
  Filled 2016-07-19 (×2): qty 5

## 2016-07-19 NOTE — Progress Notes (Signed)
Pt family voiced concerns about not meeting pt needs regarding pain medication. During rounds pt noticed to be asleep. Educated family members on availability of pain medications and also explained that if patients are asleep nurses do not wake patients up to offer pain medication, that pain medication is as needed and we will be glad to provide it upon request. Pt was treated with 10 mg of oxycodone earlier on shift and with morphine at 04:06. Will continue to monitor and encourage pt to request pain medication as needed as pt is alert and oriented x 4 himself.

## 2016-07-19 NOTE — Progress Notes (Signed)
Discharged home with significant other.  To be followed by oncology.

## 2016-07-19 NOTE — Progress Notes (Signed)
*  PRELIMINARY RESULTS* Echocardiogram 2D Echocardiogram has been performed.  David [name] 07/19/2016, 2:20 PM

## 2016-07-19 NOTE — Progress Notes (Signed)
Patient c/o shoulder pain while having a stress test.  Morphine 2 mg. Given in cardio pulmonary.

## 2016-07-19 NOTE — Progress Notes (Signed)
Initial Nutrition Assessment  DOCUMENTATION CODES:   Not applicable  INTERVENTION:  1. Ensure Enlive po TID, each supplement provides 350 kcal and 20 grams of protein 2. Magic cup TID with meals, each supplement provides 290 kcal and 9 grams of protein  NUTRITION DIAGNOSIS:   Inadequate oral intake related to poor appetite, cancer and cancer related treatments as evidenced by per patient/family report.  GOAL:   Patient will meet greater than or equal to 90% of their needs  MONITOR:   PO intake, I & O's, Labs, Weight trends  REASON FOR ASSESSMENT:   Malnutrition Screening Tool    ASSESSMENT:   David [name] is a 60 y.o. male has a past medical history significant for COPD and lung cancer with chronic pain now with localized left chest pain associated with SOB and nausea  Patient was not available during time of visit, history given by Wife. She states patient normally only eats once per day because they don't have money for groceries after paying for his treatments. Reports weight was about 160# ~1 year ago, now down to 126# Per chart he was 150# as of 08/2015 - exhibiting a 24#/16% insignificant wt loss over 11 months. States patient eats well at home but is struggling to maintain his weight. Denies nausea/vomiting Denies any concerns chewing/swallowing or choking Reports lots of pain at home - he is on PRN morphine here. She does admit to some taste alterations when patient is on chemo - but he is not currently on chemo at this time. Would like ensure at home but is unable to pay for out of pocket. They have been getting some from the [place] but wife reports it is not enough.  Unable to complete Nutrition-Focused physical exam at this time.   Labs and medications reviewed: Colace NS @ 70m/hr  Diet Order:  Diet NPO time specified  Skin:  Reviewed, no issues  Last BM:  12/10  Height:   Ht Readings from Last 1 Encounters:  07/18/16 '5\' 9"'$  (1.753 m)     Weight:   Wt Readings from Last 1 Encounters:  07/19/16 126 lb 11.2 oz (57.5 kg)    Ideal Body Weight:  72.72 kg  BMI:  Body mass index is 18.71 kg/m.  Estimated Nutritional Needs:   Kcal:  2000-2300 calories  Protein:  74-96 gm  Fluid:  >/= 2L  EDUCATION NEEDS:   No education needs identified at this time  WSatira Anis Rondey Fallen, MS, RD LDN Inpatient Clinical Dietitian Pager [phone]

## 2016-07-19 NOTE — Discharge Summary (Signed)
[facility] [place] NAME: David [name]    MR#:  [id number]  DATE OF BIRTH:  [birth year]  DATE OF ADMISSION:  07/18/2016 ADMITTING PHYSICIAN: Idelle Crouch, MD  DATE OF DISCHARGE: No discharge date for patient encounter.  PRIMARY CARE PHYSICIAN: Nobie Putnam, DO     ADMISSION DIAGNOSIS:  Chest pain [R07.9] Nonspecific chest pain [R07.9]  DISCHARGE DIAGNOSIS:  Principal Problem:   Chest pain Active Problems:   Small cell lung cancer (HCC)   COPD (chronic obstructive pulmonary disease) (HCC)   Multiple lung nodules   Primary osteoarthritis involving multiple joints   Anemia   SECONDARY DIAGNOSIS:   Past Medical History:  Diagnosis Date  . Arthritis   . Asthma   . COPD (chronic obstructive pulmonary disease) (HCC)    not on home o2  . Gout   . History of kidney stones   . History of melanoma   . Hypertension   . Oxygen deficiency    O2 1 LITER [place] [facility]  . Pain    chronic pain all over  . Small cell lung cancer (HCC)    melanoma  . Tobacco use     .pro HOSPITAL COURSE:   Patient is a 60 year old Caucasian male with medical history significant for history of COPD,  gout, kidney stones, hypertension, small cell lung cancer, tobacco abuse, ongoing, who presents to the hospital with complaints of midsternal chest pain associated with shortness of breath and nausea. Pain is localized in the lower part of the sternum, not associated with exertion. Patient's vitals were normal, cardiac enzymes were normal, EKG was normal. Patient was admitted to the hospital for further evaluation and treatment and underwent cardiac stress test, which was low risk study. Echocardiogram was also normal. It was felt that patient had likely musculoskeletal pain, he was given a few pills of oxycodone and recommended to follow-up with his primary care physician for further recommendations  Discussion by problem: #1. Musculoskeletal  chest pain, noncardiac chest pain, Myoview stress test is negative, and echocardiogram was normal, likely due to muscle/joints, due to increased work of breathing due to severe emphysema/COPD. Patient was advised to follow-up with his primary care physician for further recommendations, few pills of oxycodone were given upon discharge, oxygen level was 100% on room air, no pulmonary embolism on CT angiogram #2. Anemia, no active bleeding noted, Hemoccult of stool needs to be checked as outpatient, it is recommended to follow hemoglobin level as outpatient #3. Lung nodules on CT angiogram, concerning for metastatic disease, patient is to follow-up with Dr. Grayland Ormond for further recommendations, he states that his insurance does not pay for PET scan #4. Emphysema, patient is to continue outpatient medications. No changes were made, oxygenation is satisfactory #5. Tobacco abuse. Counseling, discussed this patient for 3 minutes, nicotine replacement therapy was recommended.    DISCHARGE CONDITIONS:   Stable  CONSULTS OBTAINED:    DRUG ALLERGIES:   Allergies  Allergen Reactions  . Tylenol [Acetaminophen] Other (See Comments)    Unable to take due to chemotherapy regimen  . Vicodin [Hydrocodone-Acetaminophen] Nausea And Vomiting and Rash    DISCHARGE MEDICATIONS:   Current Discharge Medication List    START taking these medications   Details  docusate sodium (COLACE) 100 MG capsule Take 1 capsule (100 mg total) by mouth 2 (two) times daily. Qty: 10 capsule, Refills: 0    feeding supplement, ENSURE ENLIVE, (ENSURE ENLIVE) LIQD Take 237 mLs by mouth 3 (  three) times daily between meals. Qty: 237 mL, Refills: 12    nicotine (NICODERM CQ - DOSED IN MG/24 HOURS) 21 mg/24hr patch Place 1 patch (21 mg total) onto the skin daily. Qty: 28 patch, Refills: 0    pantoprazole (PROTONIX) 40 MG tablet Take 1 tablet (40 mg total) by mouth daily. Qty: 30 tablet, Refills: 5      CONTINUE these  medications which have CHANGED   Details  oxyCODONE 10 MG TABS Take 1 tablet (10 mg total) by mouth 3 (three) times daily. Qty: 15 tablet, Refills: 0      CONTINUE these medications which have NOT CHANGED   Details  !! albuterol (PROVENTIL HFA;VENTOLIN HFA) 108 (90 Base) MCG/ACT inhaler Inhale 2 puffs into the lungs every 4 (four) hours as needed for wheezing or shortness of breath. Qty: 1 Inhaler, Refills: 6    ibuprofen (ADVIL,MOTRIN) 200 MG tablet Take 800 mg by mouth every 8 (eight) hours.    meloxicam (MOBIC) 15 MG tablet Take 1 tablet (15 mg total) by mouth every morning. Qty: 30 tablet, Refills: 3   Associated Diagnoses: Chronic pain of both shoulders; Primary osteoarthritis involving multiple joints    tiotropium (SPIRIVA) 18 MCG inhalation capsule Place 1 capsule (18 mcg total) into inhaler and inhale daily. Qty: 30 capsule, Refills: 5    !! albuterol (PROVENTIL HFA;VENTOLIN HFA) 108 (90 Base) MCG/ACT inhaler Inhale 2 puffs into the lungs every 6 (six) hours as needed for wheezing or shortness of breath. Qty: 1 Inhaler, Refills: 2   Associated Diagnoses: Centrilobular emphysema (HCC)    Fluticasone-Salmeterol (ADVAIR DISKUS) 250-50 MCG/DOSE AEPB Inhale 1 puff into the lungs 2 (two) times daily. Qty: 1 each, Refills: 5     !! - Potential duplicate medications found. Please discuss with provider.       DISCHARGE INSTRUCTIONS:    Patient is to follow-up with primary care physician as outpatient  If you experience worsening of your admission symptoms, develop shortness of breath, life threatening emergency, suicidal or homicidal thoughts you must seek medical attention immediately by calling 911 or calling your MD immediately  if symptoms less severe.  You Must read complete instructions/literature along with all the possible adverse reactions/side effects for all the Medicines you take and that have been prescribed to you. Take any new Medicines after you have completely  understood and accept all the possible adverse reactions/side effects.   Please note  You were cared for by a hospitalist during your hospital stay. If you have any questions about your discharge medications or the care you received while you were in the hospital after you are discharged, you can call the unit and asked to speak with the hospitalist on call if the hospitalist that took care of you is not available. Once you are discharged, your primary care physician will handle any further medical issues. Please note that NO REFILLS for any discharge medications will be authorized once you are discharged, as it is imperative that you return to your primary care physician (or establish a relationship with a primary care physician if you do not have one) for your aftercare needs so that they can reassess your need for medications and monitor your lab values.    Today   CHIEF COMPLAINT:   Chief Complaint  Patient presents with  . Chest Pain    HISTORY OF PRESENT ILLNESS:  Jarom Govan  is a 60 y.o. male with a known history of COPD,  gout, kidney stones, hypertension, small  cell lung cancer, tobacco abuse, ongoing, who presents to the hospital with complaints of midsternal chest pain associated with shortness of breath and nausea. Pain is localized in the lower part of the sternum, not associated with exertion. Patient's vitals were normal, cardiac enzymes were normal, EKG was normal. Patient was admitted to the hospital for further evaluation and treatment and underwent cardiac stress test, which was low risk study. Echocardiogram was also normal. It was felt that patient had likely musculoskeletal pain, he was given a few pills of oxycodone and recommended to follow-up with his primary care physician for further recommendations  Discussion by problem: #1. Musculoskeletal chest pain, noncardiac chest pain, Myoview stress test is negative, and echocardiogram was normal, likely due to  muscle/joints, due to increased work of breathing due to severe emphysema/COPD. Patient was advised to follow-up with his primary care physician for further recommendations, few pills of oxycodone were given upon discharge, oxygen level was 100% on room air, no pulmonary embolism on CT angiogram #2. Anemia, no active bleeding noted, Hemoccult of stool needs to be checked as outpatient, it is recommended to follow hemoglobin level as outpatient #3. Lung nodules on CT angiogram, concerning for metastatic disease, patient is to follow-up with Dr. Grayland Ormond for further recommendations, he states that his insurance does not pay for PET scan #4. Emphysema, patient is to continue outpatient medications. No changes were made, oxygenation is satisfactory #5. Tobacco abuse. Counseling, discussed this patient for 3 minutes, nicotine replacement therapy was recommended.     VITAL SIGNS:  Blood pressure 126/72, pulse 81, temperature 97.8 F (36.6 C), temperature source Oral, resp. rate 18, height '5\' 9"'$  (1.753 m), weight 57.5 kg (126 lb 11.2 oz), SpO2 100 %.  I/O:   Intake/Output Summary (Last 24 hours) at 07/19/16 1724 Last data filed at 07/18/16 2109  Gross per 24 hour  Intake               38 ml  Output              300 ml  Net             -262 ml    PHYSICAL EXAMINATION:  GENERAL:  60 y.o.-year-old patient lying in the bed with no acute distress.  EYES: Pupils equal, round, reactive to light and accommodation. No scleral icterus. Extraocular muscles intact.  HEENT: Head atraumatic, normocephalic. Oropharynx and nasopharynx clear.  NECK:  Supple, no jugular venous distention. No thyroid enlargement, no tenderness.  LUNGS: Normal breath sounds bilaterally, no wheezing, rales,rhonchi or crepitation. No use of accessory muscles of respiration.  CARDIOVASCULAR: S1, S2 normal. No murmurs, rubs, or gallops.  ABDOMEN: Soft, non-tender, non-distended. Bowel sounds present. No organomegaly or mass.   EXTREMITIES: No pedal edema, cyanosis, or clubbing.  NEUROLOGIC: Cranial nerves II through XII are intact. Muscle strength 5/5 in all extremities. Sensation intact. Gait not checked.  PSYCHIATRIC: The patient is alert and oriented x 3.  SKIN: No obvious rash, lesion, or ulcer.   DATA REVIEW:   CBC  Recent Labs Lab 07/19/16 0435  WBC 4.8  HGB 10.0*  HCT 29.3*  PLT 276    Chemistries   Recent Labs Lab 07/19/16 0435  NA 142  K 4.1  CL 107  CO2 31  GLUCOSE 90  BUN 11  CREATININE 0.91  CALCIUM 8.8*  AST 15  ALT 9*  ALKPHOS 48  BILITOT <0.1*    Cardiac Enzymes  Recent Labs Lab 07/19/16 0435  TROPONINI <0.03  Microbiology Results  Results for orders placed or performed during the hospital encounter of 12/31/15  Blood Culture (routine x 2)     Status: None   Collection Time: 12/31/15 12:33 PM  Result Value Ref Range Status   Specimen Description BLOOD LEFT [facility]  Final   Special Requests BOTTLES DRAWN AEROBIC AND ANAEROBIC 6ML  Final   Culture NO GROWTH 5 DAYS  Final   Report Status 01/05/2016 FINAL  Final  Blood Culture (routine x 2)     Status: None   Collection Time: 12/31/15  1:56 PM  Result Value Ref Range Status   Specimen Description BLOOD LSC  Final   Special Requests BOTTLES DRAWN AEROBIC AND ANAEROBIC 6CC  Final   Culture NO GROWTH 5 DAYS  Final   Report Status 01/05/2016 FINAL  Final  Urine culture     Status: None   Collection Time: 12/31/15  1:56 PM  Result Value Ref Range Status   Specimen Description URINE, RANDOM  Final   Special Requests NONE  Final   Culture NO GROWTH Performed at [facility]   Final   Report Status 01/02/2016 FINAL  Final  Rapid Influenza A&B Antigens ([place] only)     Status: None   Collection Time: 12/31/15  1:56 PM  Result Value Ref Range Status   Influenza A ([facility]) NEGATIVE NEGATIVE Final   Influenza B ([facility]) NEGATIVE NEGATIVE Final    RADIOLOGY:  Dg Chest 2 View  Result Date: 07/18/2016 CLINICAL  DATA:  Mid chest pain which began approximately 45 minutes ago. History of smoking. History of small-cell cancer. EXAM: CHEST  2 VIEW COMPARISON:  Chest CT - 05/11/2016; chest radiograph - 04/04/2016 FINDINGS: Grossly unchanged cardiac silhouette and mediastinal contours. Atherosclerotic plaque within the thoracic aorta. Stable position of support apparatus. Known ill-defined pulmonary nodules within the bilateral upper lobes are unchanged. Unchanged asymmetric pleuroparenchymal thickening about the peripheral aspect the right upper lung. The lungs remain hyperexpanded with flattening the diaphragms and mild diffuse slightly nodular thickening of the pulmonary interstitium. No new focal airspace opacities. No pleural effusion or pneumothorax. No evidence of edema. No acute osseus abnormalities. IMPRESSION: 1. Stable examination without acute cardiopulmonary disease. 2. Grossly unchanged appearance of known bilateral upper lobe pulmonary nodules. 3.  Emphysema. (ICD10-J43.9) 4.  Aortic Atherosclerosis (ICD10-170.0) Electronically Signed   By: Sandi Mariscal M.D.   On: 07/18/2016 11:05   Ct Angio Chest Pe W/cm &/or Wo Cm  Result Date: 07/18/2016 CLINICAL DATA:  Sudden onset of substernal and pleuritic LEFT chest pain 45 minutes ago while watching television, history small cell lung carcinoma, smoking, melanoma, hypertension, asthma EXAM: CT ANGIOGRAPHY CHEST WITH CONTRAST TECHNIQUE: Multidetector CT imaging of the chest was performed using the standard protocol during bolus administration of intravenous contrast. Multiplanar CT image reconstructions and MIPs were obtained to evaluate the vascular anatomy. CONTRAST:  75 cc Isovue 370 IV COMPARISON:  05/11/2016 CT chest FINDINGS: Cardiovascular: Scattered atherosclerotic calcifications aorta, upper normal caliber 3.8 cm diameter. No definite evidence of aneurysm or dissection. Pulmonary arteries well opacified and patent. No evidence of pulmonary embolism. Minimal  pericardial effusion. Mediastinum/Nodes: Esophagus unremarkable. Few normal size mediastinal nodes without definite thoracic adenopathy. Number of LEFT axillary nodes are identified largest 11 mm short axis. Visualized base of cervical region normal appearance Lungs/Pleura: Severe emphysematous changes diffusely. Spiculated nodular density RIGHT upper lobe 17 x 12 mm image 42 previously 17 x 13 mm. Adjacent areas of nodularity in the RIGHT upper lobe measure 17  x 8 mm image 43 previously 19 x 7 mm and 15 x 7 mm image 44 previously 15 x 8 mm. Few scattered calcified pulmonary granulomata are identified bilaterally. A few additional scattered pulmonary nodules which are not definitively calcified are identified in both lungs appear stable as well. No definite enlarging or new pulmonary nodules identified. Upper Abdomen: No significant abnormalities Musculoskeletal: No osseous lesion seen. Review of the MIP images confirms the above findings. IMPRESSION: No evidence pulmonary embolism. Severe emphysematous and old granulomatous disease changes. Stable RIGHT upper lobe nodular foci as well as additional stable BILATERAL pulmonary nodules. No acute abnormalities. Aortic atherosclerosis. Electronically Signed   By: Lavonia Dana M.D.   On: 07/18/2016 12:59   Nm Myocar Multi W/spect W/wall Motion / Ef  Result Date: 07/19/2016  There was no ST segment deviation noted during stress.  No T wave inversion was noted during stress.  The study is normal.  This is a low risk study.  The left ventricular ejection fraction is normal (55-65%).  Suboptimal study due to GI uptake.     EKG:   Orders placed or performed during the hospital encounter of 07/18/16  . ED EKG within 10 minutes  . ED EKG within 10 minutes      Management plans discussed with the patient, family and they are in agreement.  CODE STATUS:     Code Status Orders        Start     Ordered   07/18/16 1437  Full code  Continuous      07/18/16 1437    Code Status History    Date Active Date Inactive Code Status Order ID Comments User Context   12/31/2015  5:40 PM 01/02/2016  2:11 PM Full Code [id number]  Gladstone Lighter, MD Inpatient      TOTAL TIME TAKING CARE OF THIS PATIENT: 40 minutes.    Theodoro Grist M.D on 07/19/2016 at 5:24 PM  Between 7am to 6pm - Pager - [phone]  After 6pm go to [email or web address] - password [email or web address] [facility]  Office  [phone]  CC: Primary care physician; Nobie Putnam, DO

## 2016-07-20 ENCOUNTER — Telehealth: Payer: Self-pay | Admitting: Internal Medicine

## 2016-07-20 ENCOUNTER — Telehealth: Payer: Self-pay | Admitting: *Deleted

## 2016-07-20 NOTE — Telephone Encounter (Signed)
Spoke with pt spouse who states pt had a recent admission on 07/18/16 for chest pain. Pt wife states pt was told he now has a mass on his left lung as well, pt spouse would like DK to review CT scan that was performed on 07/18/16 before pt bronch that is scheduled for 07/22/16.  DK please advise. Thanks.

## 2016-07-20 NOTE — Telephone Encounter (Signed)
Had extended conversion with pt's significant other regarding pain management and possible recurrence of small cell. Pt is scheduled for biopsy on 12/14 by Dr. Mortimer Fries. Dr. Grayland Ormond is aware and stated will follow up with pt once results are reported. [name] was made aware that we will contact her early next week to schedule appt and at the time of the appt we will refer patient to different pain clinic in [place] per pt request. [name] verbalized understanding of the above.

## 2016-07-20 NOTE — Telephone Encounter (Signed)
Pt wife calling stating pt was in hospital over the weekend and this week he has a procedure with Dr Mortimer Fries  Would like [place] to look over all the hospital tests and see if patient is still okay for procedure. Please advise.

## 2016-07-22 ENCOUNTER — Encounter
Admission: RE | Disposition: A | Discharge status: Home / Self Care | Payer: Self-pay | Source: Ambulatory Visit | Attending: Internal Medicine

## 2016-07-22 ENCOUNTER — Ambulatory Visit: Payer: Medicaid Other | Admitting: Anesthesiology

## 2016-07-22 ENCOUNTER — Encounter: Payer: Self-pay | Admitting: Anesthesiology

## 2016-07-22 ENCOUNTER — Ambulatory Visit: Payer: Medicaid Other

## 2016-07-22 ENCOUNTER — Ambulatory Visit
Admission: RE | Admit: 2016-07-22 | Discharge: 2016-07-22 | Disposition: A | Discharge status: Home / Self Care | Payer: Medicaid Other | Source: Ambulatory Visit | Attending: Internal Medicine | Admitting: Internal Medicine

## 2016-07-22 DIAGNOSIS — I10 Essential (primary) hypertension: Secondary | ICD-10-CM | POA: Insufficient documentation

## 2016-07-22 DIAGNOSIS — M1991 Primary osteoarthritis, unspecified site: Secondary | ICD-10-CM | POA: Diagnosis not present

## 2016-07-22 DIAGNOSIS — F419 Anxiety disorder, unspecified: Secondary | ICD-10-CM | POA: Insufficient documentation

## 2016-07-22 DIAGNOSIS — Z886 Allergy status to analgesic agent status: Secondary | ICD-10-CM | POA: Diagnosis not present

## 2016-07-22 DIAGNOSIS — Z9981 Dependence on supplemental oxygen: Secondary | ICD-10-CM | POA: Diagnosis not present

## 2016-07-22 DIAGNOSIS — D649 Anemia, unspecified: Secondary | ICD-10-CM | POA: Insufficient documentation

## 2016-07-22 DIAGNOSIS — Z87442 Personal history of urinary calculi: Secondary | ICD-10-CM | POA: Diagnosis not present

## 2016-07-22 DIAGNOSIS — J449 Chronic obstructive pulmonary disease, unspecified: Secondary | ICD-10-CM | POA: Diagnosis not present

## 2016-07-22 DIAGNOSIS — I700 Atherosclerosis of aorta: Secondary | ICD-10-CM | POA: Insufficient documentation

## 2016-07-22 DIAGNOSIS — G893 Neoplasm related pain (acute) (chronic): Secondary | ICD-10-CM | POA: Insufficient documentation

## 2016-07-22 DIAGNOSIS — Z8379 Family history of other diseases of the digestive system: Secondary | ICD-10-CM | POA: Diagnosis not present

## 2016-07-22 DIAGNOSIS — R918 Other nonspecific abnormal finding of lung field: Secondary | ICD-10-CM

## 2016-07-22 DIAGNOSIS — I313 Pericardial effusion (noninflammatory): Secondary | ICD-10-CM | POA: Insufficient documentation

## 2016-07-22 DIAGNOSIS — C3411 Malignant neoplasm of upper lobe, right bronchus or lung: Secondary | ICD-10-CM | POA: Insufficient documentation

## 2016-07-22 DIAGNOSIS — Z79899 Other long term (current) drug therapy: Secondary | ICD-10-CM | POA: Diagnosis not present

## 2016-07-22 DIAGNOSIS — M109 Gout, unspecified: Secondary | ICD-10-CM | POA: Insufficient documentation

## 2016-07-22 DIAGNOSIS — F17210 Nicotine dependence, cigarettes, uncomplicated: Secondary | ICD-10-CM | POA: Diagnosis not present

## 2016-07-22 DIAGNOSIS — Z885 Allergy status to narcotic agent status: Secondary | ICD-10-CM | POA: Insufficient documentation

## 2016-07-22 DIAGNOSIS — Z85820 Personal history of malignant melanoma of skin: Secondary | ICD-10-CM | POA: Insufficient documentation

## 2016-07-22 DIAGNOSIS — R079 Chest pain, unspecified: Secondary | ICD-10-CM | POA: Insufficient documentation

## 2016-07-22 DIAGNOSIS — Z8249 Family history of ischemic heart disease and other diseases of the circulatory system: Secondary | ICD-10-CM | POA: Diagnosis not present

## 2016-07-22 DIAGNOSIS — Z801 Family history of malignant neoplasm of trachea, bronchus and lung: Secondary | ICD-10-CM | POA: Diagnosis not present

## 2016-07-22 HISTORY — PX: ELECTROMAGNETIC NAVIGATION BROCHOSCOPY: SHX5369

## 2016-07-22 LAB — URINE DRUG SCREEN, QUALITATIVE (ARMC ONLY)
Amphetamines, Ur Screen: NOT DETECTED
BARBITURATES, UR SCREEN: NOT DETECTED
BENZODIAZEPINE, UR SCRN: NOT DETECTED
Cannabinoid 50 Ng, Ur ~~LOC~~: NOT DETECTED
Cocaine Metabolite,Ur ~~LOC~~: NOT DETECTED
MDMA (Ecstasy)Ur Screen: NOT DETECTED
METHADONE SCREEN, URINE: NOT DETECTED
OPIATE, UR SCREEN: NOT DETECTED
PHENCYCLIDINE (PCP) UR S: NOT DETECTED
Tricyclic, Ur Screen: NOT DETECTED

## 2016-07-22 SURGERY — ELECTROMAGNETIC NAVIGATION BRONCHOSCOPY
Anesthesia: General

## 2016-07-22 MED ORDER — ROCURONIUM BROMIDE 100 MG/10ML IV SOLN
INTRAVENOUS | Status: DC | PRN
  Administered 2016-07-22: 10 mg via INTRAVENOUS
  Administered 2016-07-22: 30 mg via INTRAVENOUS

## 2016-07-22 MED ORDER — PROPOFOL 10 MG/ML IV BOLUS
INTRAVENOUS | Status: DC | PRN
  Administered 2016-07-22: 100 mg via INTRAVENOUS

## 2016-07-22 MED ORDER — HEPARIN SOD (PORK) LOCK FLUSH 100 UNIT/ML IV SOLN
INTRAVENOUS | Status: AC
  Administered 2016-07-22: 500 [IU] via INTRAVENOUS
  Filled 2016-07-22: qty 5

## 2016-07-22 MED ORDER — LACTATED RINGERS IV SOLN
INTRAVENOUS | Status: DC | PRN
  Administered 2016-07-22: 13:00:00 via INTRAVENOUS

## 2016-07-22 MED ORDER — HEPARIN SOD (PORK) LOCK FLUSH 100 UNIT/ML IV SOLN
500.0000 [IU] | Freq: Once | INTRAVENOUS | Status: AC
  Administered 2016-07-22: 500 [IU] via INTRAVENOUS

## 2016-07-22 MED ORDER — FENTANYL CITRATE (PF) 100 MCG/2ML IJ SOLN
INTRAMUSCULAR | Status: DC | PRN
  Administered 2016-07-22 (×2): 50 ug via INTRAVENOUS

## 2016-07-22 MED ORDER — OXYCODONE HCL ER 10 MG PO T12A
10.0000 mg | EXTENDED_RELEASE_TABLET | Freq: Two times a day (BID) | ORAL | Status: DC
  Administered 2016-07-22: 10 mg via ORAL

## 2016-07-22 MED ORDER — ONDANSETRON HCL 4 MG/2ML IJ SOLN
INTRAMUSCULAR | Status: DC | PRN
Start: 2016-07-22 — End: 2016-07-22
  Administered 2016-07-22: 4 mg via INTRAVENOUS

## 2016-07-22 MED ORDER — LIDOCAINE HCL (CARDIAC) 20 MG/ML IV SOLN
INTRAVENOUS | Status: DC | PRN
  Administered 2016-07-22: 40 mg via INTRAVENOUS

## 2016-07-22 MED ORDER — FENTANYL CITRATE (PF) 100 MCG/2ML IJ SOLN
25.0000 ug | INTRAMUSCULAR | Status: DC | PRN
  Administered 2016-07-22 (×2): 25 ug via INTRAVENOUS

## 2016-07-22 MED ORDER — OXYCODONE HCL ER 10 MG PO T12A
EXTENDED_RELEASE_TABLET | ORAL | Status: AC
  Filled 2016-07-22: qty 1

## 2016-07-22 MED ORDER — ONDANSETRON HCL 4 MG/2ML IJ SOLN: 4.0000 mg | Freq: Once | INTRAMUSCULAR | Status: DC | PRN

## 2016-07-22 MED ORDER — FENTANYL CITRATE (PF) 100 MCG/2ML IJ SOLN
INTRAMUSCULAR | Status: AC
  Administered 2016-07-22: 25 ug via INTRAVENOUS
  Filled 2016-07-22: qty 2

## 2016-07-22 MED ORDER — PHENYLEPHRINE HCL 0.25 % NA SOLN
1.0000 | Freq: Four times a day (QID) | NASAL | Status: DC | PRN
  Filled 2016-07-22: qty 15

## 2016-07-22 MED ORDER — LACTATED RINGERS IV SOLN: INTRAVENOUS | Status: DC

## 2016-07-22 MED ORDER — SUGAMMADEX SODIUM 200 MG/2ML IV SOLN
INTRAVENOUS | Status: DC | PRN
  Administered 2016-07-22: 120 mg via INTRAVENOUS

## 2016-07-22 MED ORDER — MIDAZOLAM HCL 2 MG/2ML IJ SOLN
INTRAMUSCULAR | Status: DC | PRN
  Administered 2016-07-22: 2 mg via INTRAVENOUS

## 2016-07-22 NOTE — Transfer of Care (Signed)
Immediate Anesthesia Transfer of Care Note  Patient: David [name]  Procedure(s) Performed: Procedure(s): ELECTROMAGNETIC NAVIGATION BRONCHOSCOPY (N/A)  Patient Location: PACU  Anesthesia Type:General  Level of Consciousness: awake and alert   Airway & Oxygen Therapy: Patient Spontanous Breathing  Post-op Assessment: Report given to RN and Post -op Vital signs reviewed and stable  Post vital signs: Reviewed and stable  Last Vitals:  Vitals:   07/22/16 1417  BP: 115/79  Pulse: 94  Resp: 17  Temp: 36.3 C    Last Pain: There were no vitals filed for this visit.       Complications: No apparent anesthesia complications

## 2016-07-22 NOTE — Discharge Instructions (Signed)

## 2016-07-22 NOTE — Anesthesia Postprocedure Evaluation (Signed)
Anesthesia Post Note  Patient: David [name]  Procedure(s) Performed: Procedure(s) (LRB): ELECTROMAGNETIC NAVIGATION BRONCHOSCOPY (N/A)  Patient location during evaluation: PACU Anesthesia Type: General Level of consciousness: awake and alert Pain management: pain level controlled Vital Signs Assessment: post-procedure vital signs reviewed and stable Respiratory status: spontaneous breathing, nonlabored ventilation, respiratory function stable and patient connected to nasal cannula oxygen Cardiovascular status: blood pressure returned to baseline and stable Postop Assessment: no signs of nausea or vomiting Anesthetic complications: no    Last Vitals:  Vitals:   07/22/16 1515 07/22/16 1528  BP: 121/66 116/61  Pulse: 81 82  Resp: 18   Temp:      Last Pain:  Vitals:   07/22/16 1515  PainSc: [name]

## 2016-07-22 NOTE — Anesthesia Preprocedure Evaluation (Addendum)
Anesthesia Evaluation  Patient identified by MRN, date of birth, ID band Patient awake    Reviewed: Allergy & Precautions, NPO status , Patient's Chart, lab work & pertinent test results, reviewed documented beta blocker date and time   Airway Mallampati: II  TM Distance: >3 FB     Dental  (+) Upper Dentures, Lower Dentures   Pulmonary asthma , COPD,  COPD inhaler, Current Smoker,           Cardiovascular hypertension, Pt. on medications      Neuro/Psych PSYCHIATRIC DISORDERS Anxiety    GI/Hepatic   Endo/Other    Renal/GU      Musculoskeletal  (+) Arthritis ,   Abdominal   Peds  Hematology  (+) anemia ,   Anesthesia Other Findings Gout. Hx of melanoma. Normal echo with ef 50-60. Hb 10. Hx of drug abuse. On O2 at nite.  Reproductive/Obstetrics                           Anesthesia Physical Anesthesia Plan  ASA: III  Anesthesia Plan: General   Post-op Pain Management:    Induction: Intravenous  Airway Management Planned: Oral ETT  Additional Equipment:   Intra-op Plan:   Post-operative Plan:   Informed Consent: I have reviewed the patients History and Physical, chart, labs and discussed the procedure including the risks, benefits and alternatives for the proposed anesthesia with the patient or authorized representative who has indicated his/her understanding and acceptance.     Plan Discussed with: CRNA  Anesthesia Plan Comments:         Anesthesia Quick Evaluation

## 2016-07-22 NOTE — Anesthesia Procedure Notes (Signed)
Procedure Name: Intubation Date/Time: 07/22/2016 1:13 PM Performed by: Hedda Slade Pre-anesthesia Checklist: Patient identified, Emergency Drugs available, Suction available and Patient being monitored Patient Re-evaluated:Patient Re-evaluated prior to inductionOxygen Delivery Method: Circle system utilized Preoxygenation: Pre-oxygenation with 100% oxygen Intubation Type: IV induction Ventilation: Mask ventilation without difficulty and Oral airway inserted - appropriate to patient size Laryngoscope Size: Mac and 4 Grade View: Grade I Tube type: Oral Tube size: 8.5 mm Number of attempts: 1 Airway Equipment and Method: Stylet Placement Confirmation: ETT inserted through vocal cords under direct vision,  positive ETCO2 and breath sounds checked- equal and bilateral Secured at: 21 cm Tube secured with: Tape Dental Injury: Teeth and Oropharynx as per pre-operative assessment

## 2016-07-22 NOTE — H&P (View-Only) (Signed)
History and Physical    OPAL MCKELLIPS FFM:[id number] DOB: [birth year] DOA: 07/18/2016  Referring physician: Dr. Reita Cliche PCP: Nobie Putnam, DO  Specialists: none  Chief Complaint: chest pain  HPI: David [name] is a 60 y.o. male has a past medical history significant for COPD and lung cancer with chronic pain now with localized left chest pain associated with SOB and nausea. No exertional component  In ER, EKG and 1st troponin OK. He is now admitted. Denies cardiac hx. Still with localized CP in ER  Review of Systems: The patient denies anorexia, fever, weight loss,, vision loss, decreased hearing, hoarseness, syncope,  peripheral edema, balance deficits, hemoptysis, abdominal pain, melena, hematochezia, severe indigestion/heartburn, hematuria, incontinence, genital sores, muscle weakness, suspicious skin lesions, transient blindness, difficulty walking, depression, unusual weight change, abnormal bleeding, enlarged lymph nodes, angioedema, and breast masses.   Past Medical History:  Diagnosis Date  . Arthritis   . Asthma   . COPD (chronic obstructive pulmonary disease) (HCC)    not on home o2  . Gout   . History of kidney stones   . History of melanoma   . Hypertension   . Oxygen deficiency    O2 1 LITER  [facility]  . Pain    chronic pain all over  . Small cell lung cancer (HCC)    melanoma  . Tobacco use    Past Surgical History:  Procedure Laterality Date  . PERIPHERAL VASCULAR CATHETERIZATION N/A 09/15/2015   Procedure: Glori Luis Cath Insertion;  Surgeon: Algernon Huxley, MD;  Location: [place] [facility];  Service: Cardiovascular;  Laterality: N/A;  . SKIN GRAFT     [facility]   Social History:  reports that he has been smoking Cigarettes.  He started smoking about 21 years ago. He has a 51.00 pack-year smoking history. He has never used smokeless tobacco. He reports that he uses drugs, including Cocaine. He reports that he does not drink alcohol.  Allergies  Allergen Reactions   . Tylenol [Acetaminophen] Other (See Comments)    Unable to take due to chemotherapy regimen  . Vicodin [Hydrocodone-Acetaminophen] Nausea And Vomiting and Rash    Family History  Problem Relation Age of Onset  . Lung cancer Mother   . Heart attack Mother 19  . Cirrhosis Father 83    Prior to Admission medications   Medication Sig Start Date End Date Taking? Authorizing Provider  albuterol (PROVENTIL HFA;VENTOLIN HFA) 108 (90 Base) MCG/ACT inhaler Inhale 2 puffs into the lungs every 4 (four) hours as needed for wheezing or shortness of breath. 06/16/16  Yes Flora Lipps, MD  ibuprofen (ADVIL,MOTRIN) 200 MG tablet Take 800 mg by mouth every 8 (eight) hours.   Yes Historical Provider, MD  meloxicam (MOBIC) 15 MG tablet Take 1 tablet (15 mg total) by mouth every morning. Patient taking differently: Take 15 mg by mouth daily as needed for pain.  06/23/16  Yes Alexander Devin Going, DO  oxyCODONE (OXY IR/ROXICODONE) 5 MG immediate release tablet Take 5 mg by mouth 3 (three) times daily.   Yes Historical Provider, MD  tiotropium (SPIRIVA) 18 MCG inhalation capsule Place 1 capsule (18 mcg total) into inhaler and inhale daily. 06/16/16  Yes Flora Lipps, MD  albuterol (PROVENTIL HFA;VENTOLIN HFA) 108 (90 Base) MCG/ACT inhaler Inhale 2 puffs into the lungs every 6 (six) hours as needed for wheezing or shortness of breath. Patient not taking: Reported on 07/12/2016 04/20/16   Olin Hauser, DO  Fluticasone-Salmeterol (ADVAIR DISKUS) 250-50 MCG/DOSE AEPB  Inhale 1 puff into the lungs 2 (two) times daily. Patient not taking: Reported on 07/18/2016 06/16/16 06/16/17  Flora Lipps, MD   Physical Exam: Vitals:   07/18/16 1118 07/18/16 1209 07/18/16 1230 07/18/16 1328  BP: 100/71 125/68 113/65 118/77  Pulse: 82 69 64 73  Resp: '18 18 19 18  '$ Temp: 97.6 F (36.4 C)     TempSrc: Oral     SpO2: 98% 100% 99% 100%  Weight:      Height:         General:  No apparent distress, WDWN, [place]/AT  Eyes:  PERRL, EOMI, no scleral icterus, conjunctiva clear  ENT: moist oropharynx without exudate, TM's benign, dentition poor  Neck: supple, no lymphadenopathy. No bruits or thyromegaly  Cardiovascular: regular rate without MRG; 2+ peripheral pulses, no JVD, no peripheral edema  Respiratory: CTA biL, good air movement without wheezing, or rales. Scattered rhonchi noted. Respiratory effort normal  Abdomen: soft, non tender to palpation, positive bowel sounds, no guarding, no rebound  Skin: no rashes or lesions  Musculoskeletal: normal bulk and tone, no joint swelling  Psychiatric: normal mood and affect, A&OX3  Neurologic: CN 2-12 grossly intact, Motor strength 5/5 in all 4 groups with symmetric DTR's and non-focal sensory exam  Labs on Admission:  Basic Metabolic Panel:  Recent Labs Lab 07/18/16 1035  NA 139  K 3.7  CL 104  CO2 29  GLUCOSE 103*  BUN 9  CREATININE 0.75  CALCIUM 8.9   Liver Function Tests: No results for input(s): AST, ALT, ALKPHOS, BILITOT, PROT, ALBUMIN in the last 168 hours. No results for input(s): LIPASE, AMYLASE in the last 168 hours. No results for input(s): AMMONIA in the last 168 hours. CBC:  Recent Labs Lab 07/18/16 1035  WBC 5.7  HGB 11.2*  HCT 33.3*  MCV 89.8  PLT 311   Cardiac Enzymes:  Recent Labs Lab 07/18/16 1035  TROPONINI <0.03    BNP (last 3 results)  Recent Labs  10/01/15 1117 12/31/15 1233  BNP 44.0 322.0*    ProBNP (last 3 results) No results for input(s): PROBNP in the last 8760 hours.  CBG: No results for input(s): GLUCAP in the last 168 hours.  Radiological Exams on Admission: Dg Chest 2 View  Result Date: 07/18/2016 CLINICAL DATA:  Mid chest pain which began approximately 45 minutes ago. History of smoking. History of small-cell cancer. EXAM: CHEST  2 VIEW COMPARISON:  Chest CT - 05/11/2016; chest radiograph - 04/04/2016 FINDINGS: Grossly unchanged cardiac silhouette and mediastinal contours.  Atherosclerotic plaque within the thoracic aorta. Stable position of support apparatus. Known ill-defined pulmonary nodules within the bilateral upper lobes are unchanged. Unchanged asymmetric pleuroparenchymal thickening about the peripheral aspect the right upper lung. The lungs remain hyperexpanded with flattening the diaphragms and mild diffuse slightly nodular thickening of the pulmonary interstitium. No new focal airspace opacities. No pleural effusion or pneumothorax. No evidence of edema. No acute osseus abnormalities. IMPRESSION: 1. Stable examination without acute cardiopulmonary disease. 2. Grossly unchanged appearance of known bilateral upper lobe pulmonary nodules. 3.  Emphysema. (ICD10-J43.9) 4.  Aortic Atherosclerosis (ICD10-170.0) Electronically Signed   By: Sandi Mariscal M.D.   On: 07/18/2016 11:05   Ct Angio Chest Pe W/cm &/or Wo Cm  Result Date: 07/18/2016 CLINICAL DATA:  Sudden onset of substernal and pleuritic LEFT chest pain 45 minutes ago while watching television, history small cell lung carcinoma, smoking, melanoma, hypertension, asthma EXAM: CT ANGIOGRAPHY CHEST WITH CONTRAST TECHNIQUE: Multidetector CT imaging of the chest was  performed using the standard protocol during bolus administration of intravenous contrast. Multiplanar CT image reconstructions and MIPs were obtained to evaluate the vascular anatomy. CONTRAST:  75 cc Isovue 370 IV COMPARISON:  05/11/2016 CT chest FINDINGS: Cardiovascular: Scattered atherosclerotic calcifications aorta, upper normal caliber 3.8 cm diameter. No definite evidence of aneurysm or dissection. Pulmonary arteries well opacified and patent. No evidence of pulmonary embolism. Minimal pericardial effusion. Mediastinum/Nodes: Esophagus unremarkable. Few normal size mediastinal nodes without definite thoracic adenopathy. Number of LEFT axillary nodes are identified largest 11 mm short axis. Visualized base of cervical region normal appearance Lungs/Pleura:  Severe emphysematous changes diffusely. Spiculated nodular density RIGHT upper lobe 17 x 12 mm image 42 previously 17 x 13 mm. Adjacent areas of nodularity in the RIGHT upper lobe measure 17 x 8 mm image 43 previously 19 x 7 mm and 15 x 7 mm image 44 previously 15 x 8 mm. Few scattered calcified pulmonary granulomata are identified bilaterally. A few additional scattered pulmonary nodules which are not definitively calcified are identified in both lungs appear stable as well. No definite enlarging or new pulmonary nodules identified. Upper Abdomen: No significant abnormalities Musculoskeletal: No osseous lesion seen. Review of the MIP images confirms the above findings. IMPRESSION: No evidence pulmonary embolism. Severe emphysematous and old granulomatous disease changes. Stable RIGHT upper lobe nodular foci as well as additional stable BILATERAL pulmonary nodules. No acute abnormalities. Aortic atherosclerosis. Electronically Signed   By: Lavonia Dana M.D.   On: 07/18/2016 12:59    EKG: Independently reviewed.  Assessment/Plan Principal Problem:   Chest pain Active Problems:   Small cell lung cancer (HCC)   COPD (chronic obstructive pulmonary disease) (HCC)   Primary osteoarthritis involving multiple joints   Will observe on telemetry and follow enzymes. Check echo. Proceed with myoview in AM if enzymes OK.  Diet: heart healthy Fluids: NS'@75'$   DVT Prophylaxis: SQ Heparin  Code Status: FULL  Family Communication: none  Disposition Plan: home  Time spent: 50 min

## 2016-07-22 NOTE — Interval H&P Note (Signed)
History and Physical Interval Note:  07/22/2016 1:02 PM  David [name]  has presented today for surgery, with the diagnosis of RUL NODULE  The various methods of treatment have been discussed with the patient and family. After consideration of risks, benefits and other options for treatment, the patient has consented to  Procedure(s): ELECTROMAGNETIC NAVIGATION BRONCHOSCOPY (N/A) as a surgical intervention .  The patient's history has been reviewed, patient examined, no change in status, stable for surgery.  I have reviewed the patient's chart and labs.  Questions were answered to the patient's satisfaction.     Flora Lipps

## 2016-07-22 NOTE — Op Note (Signed)
Electromagnetic Navigation Bronchoscopy: Indication:lung mass   Consent:verbal/written Risks and benefits explained in detail including risk of infection, bleeding, respiratory failure and death.   Hand washing performed prior to starting the procedure.   Type of Anesthesia: see Anesthesiology records .   Procedure Performed:  Virtual Bronchoscopy with Multi-planar Image analysis, 3-D reconstruction of coronal, sagittal and multi-planar images for the purposes of planning real-time bronchoscopy using the iLogic Electromagnetic Navigation Bronchoscopy System (superDimension)..   Description of Procedure: After obtaining informed consent from the patient, the above sedative and anesthetic measures were carried out, flexible fiberoptic bronchoscope was inserted via an oral bite block. Posterior pharynx was clear. The 2 vocal cords were easily traversed after application of local anesthetic.  The virtual camera was then placed into the central portion of the trachea. The trachea itself was inspected.  The main carina, right and left midstem bronchus and all the segmental and subsegmental airways by virtual bronchoscopy were brieftly inspected.  The camera was directed to standard registration points at the following centers: main carina, right upper lobe bronchus, right lower lobe bronchus, right middle lobe bronchus, left upper lobe bronchus, and the left lower lobe bronchus. This data was transferred to the i-Logic ENB system for real-time bronchoscopy.   I obtained samples from RUL mass after navigating to the area using navigation system. I used flouroscopy to obtain tissue sampling    Specimans Obtained:ENDOBRONCHIAL SAMPLING  Fine Needle Aspirations 21G times:6 Forceps Biopsy times:6  Triple Needle Brush:1   Fluoroscopy:  Fluoroscopy was utilized during the course of this procedure to assure that biopsies were taken in a safe manner under fluoroscopic guidance with no spot films  required.   Complications: none  Estimated Blood Loss: none  Monitoring:  The patient was monitored with continuous oximetry and received supplemental nasal cannula oxygen throughout the procedure. In addition, serial blood pressure measurements and continuous electrocardiography showed these physiologic parameters to remain tolerable throughout the procedure.   Assessment and Plan/Additional Comments: Follow up pathology reports   Gwendlyn Hanback Patricia Pesa, M.D.  [facility]  Medical Director [place] Director [facility] Card[facility] Department

## 2016-07-23 ENCOUNTER — Telehealth: Payer: Self-pay | Admitting: Oncology

## 2016-07-23 ENCOUNTER — Encounter: Payer: Self-pay | Admitting: Internal Medicine

## 2016-07-23 NOTE — Telephone Encounter (Signed)
Will see patient next week and discuss pain medication at that time. Message sent to scheduling to schedule appt. Spoke with family member at pt's residence who will inform patient.

## 2016-07-23 NOTE — Telephone Encounter (Signed)
Pt running out of oxycodone. Not enough for weekend. Please refill and call pt: [phone].

## 2016-07-25 LAB — CYTOLOGY - NON PAP

## 2016-07-25 LAB — SURGICAL PATHOLOGY

## 2016-07-26 LAB — CYTOLOGY - NON PAP

## 2016-07-26 NOTE — Progress Notes (Signed)
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  INTERVAL HISTORY: Patient returns to clinic today as an add-on to discuss his recent imaging and biopsy results. He continues to complain of pain all over, but otherwise feels well. He has a poor appetite and has lost weight in the interim. He does not complain of headache. He has no other neurologic complaints. He denies any fevers. He denies any chest pain, shortness of breath, cough, or hemoptysis. He denies any nausea, vomiting, constipation, or diarrhea. He has no urinary complaints. Patient offers no further specific complaints.  REVIEW OF SYSTEMS:   Review of Systems  Constitutional: Positive for weight loss. Negative for fever and malaise/fatigue.  HENT: Negative for tinnitus.   Respiratory: Negative for cough, hemoptysis and shortness of breath.   Cardiovascular: Negative.  Negative for chest pain and leg swelling.  Gastrointestinal: Negative.   Genitourinary: Negative.   Musculoskeletal: Positive for joint pain.  Neurological: Negative.  Negative for weakness and headaches.  Psychiatric/Behavioral: Negative.     As per HPI. Otherwise, a complete review of systems is negative.  PAST MEDICAL HISTORY: Past Medical History:  Diagnosis Date  . Arthritis   . Asthma   . COPD (chronic obstructive pulmonary disease) (HCC)    not on home o2  . Gout   . History of kidney stones   . History of melanoma   . Hypertension   . Oxygen deficiency    O2 1 LITER [facility]  . Pain    chronic pain all over  . Small cell lung cancer (HCC)    melanoma  . Tobacco use     PAST SURGICAL HISTORY: Past Surgical History:  Procedure Laterality Date  . ELECTROMAGNETIC NAVIGATION BROCHOSCOPY N/A  07/22/2016   Procedure: ELECTROMAGNETIC NAVIGATION BRONCHOSCOPY;  Surgeon: Flora Lipps, MD;  Location: [facility];  Service: Cardiopulmonary;  Laterality: N/A;  . PERIPHERAL VASCULAR CATHETERIZATION N/A 09/15/2015   Procedure: Glori Luis Cath Insertion;  Surgeon: Algernon Huxley, MD;  Location: [place] [facility];  Service: Cardiovascular;  Laterality: N/A;  . SKIN GRAFT     [facility]    FAMILY HISTORY: Reviewed and unchanged. No reported history of malignancy or chronic disease.     ADVANCED DIRECTIVES:    HEALTH MAINTENANCE: Social History  Substance Use Topics  . Smoking status: Current Every Day Smoker    Packs/day: 1.00    Years: 51.00    Types: Cigarettes    Start date: 08/12/1994  . Smokeless tobacco: Never Used     Comment: used to smoke upto 2PPD, recently cut down  . Alcohol use No     Colonoscopy:  PAP:  Bone density:  Lipid panel:  Allergies  Allergen Reactions  . Tylenol [Acetaminophen] Other (See Comments)    Unable to take due to chemotherapy regimen  . Vicodin [Hydrocodone-Acetaminophen] Nausea And Vomiting and Rash    Current Outpatient Prescriptions  Medication Sig Dispense Refill  . albuterol (PROVENTIL HFA;VENTOLIN HFA) 108 (90 Base) MCG/ACT inhaler Inhale 2 puffs into the lungs every 4 (four) hours as needed for wheezing or shortness of breath. 1 Inhaler 6  . feeding supplement, ENSURE ENLIVE, (ENSURE ENLIVE) LIQD Take 237 mLs by mouth 3 (three) times daily between meals. 237 mL 12  . ibuprofen (ADVIL,MOTRIN) 200  MG tablet Take 800 mg by mouth every 8 (eight) hours.    . meloxicam (MOBIC) 15 MG tablet Take 1 tablet (15 mg total) by mouth every morning. (Patient taking differently: Take 15 mg by mouth daily as needed for pain. ) 30 tablet 3  . pantoprazole (PROTONIX) 40 MG tablet Take 1 tablet (40 mg total) by mouth daily. 30 tablet 5  . tiotropium (SPIRIVA) 18 MCG inhalation capsule Place 1 capsule (18 mcg total) into inhaler and inhale daily. 30 capsule 5  .  dexamethasone (DECADRON) 4 MG tablet Take 1 tablet (4 mg total) by mouth daily. 30 tablet 0  . gabapentin (NEURONTIN) 300 MG capsule Take 1 capsule (300 mg total) by mouth 3 (three) times daily. 90 capsule 3  . oxyCODONE 10 MG TABS Take 1 tablet (10 mg total) by mouth 3 (three) times daily. (Patient not taking: Reported on 07/28/2016) 15 tablet 0   No current facility-administered medications for this visit.    Facility-Administered Medications Ordered in Other Visits  Medication Dose Route Frequency Provider Last Rate Last Dose  . 0.9 %  sodium chloride infusion   Intravenous Once Lloyd Huger, MD        OBJECTIVE: Vitals:   07/28/16 1131  BP: 113/67  Pulse: 90  Resp: 18  Temp: (!) 96.3 F (35.7 C)     Body mass index is 18.59 kg/m.    ECOG FS:0 - Asymptomatic  General: Thin, no acute distress. Eyes: Pink conjunctiva, anicteric sclera. Lungs: Clear to auscultation bilaterally. Heart: Regular rate and rhythm. No rubs, murmurs, or gallops. Abdomen: Soft, nontender, nondistended. No organomegaly noted, normoactive bowel sounds. Musculoskeletal: No edema, cyanosis, or clubbing. Neuro: Alert, answering all questions appropriately. Cranial nerves grossly intact. Skin: No rashes or petechiae noted. Psych: Normal affect.  LAB RESULTS:  Lab Results  Component Value Date   NA 142 07/19/2016   K 4.1 07/19/2016   CL 107 07/19/2016   CO2 31 07/19/2016   GLUCOSE 90 07/19/2016   BUN 11 07/19/2016   CREATININE 0.91 07/19/2016   CALCIUM 8.8 (L) 07/19/2016   PROT 6.2 (L) 07/19/2016   ALBUMIN 2.9 (L) 07/19/2016   AST 15 07/19/2016   ALT 9 (L) 07/19/2016   ALKPHOS 48 07/19/2016   BILITOT <0.1 (L) 07/19/2016   GFRNONAA >60 07/19/2016   GFRAA >60 07/19/2016    Lab Results  Component Value Date   WBC 4.8 07/19/2016   NEUTROABS 14.1 (H) 12/31/2015   HGB 10.0 (L) 07/19/2016   HCT 29.3 (L) 07/19/2016   MCV 89.9 07/19/2016   PLT 276 07/19/2016     STUDIES: Dg Chest 2  View  Result Date: 07/18/2016 CLINICAL DATA:  Mid chest pain which began approximately 45 minutes ago. History of smoking. History of small-cell cancer. EXAM: CHEST  2 VIEW COMPARISON:  Chest CT - 05/11/2016; chest radiograph - 04/04/2016 FINDINGS: Grossly unchanged cardiac silhouette and mediastinal contours. Atherosclerotic plaque within the thoracic aorta. Stable position of support apparatus. Known ill-defined pulmonary nodules within the bilateral upper lobes are unchanged. Unchanged asymmetric pleuroparenchymal thickening about the peripheral aspect the right upper lung. The lungs remain hyperexpanded with flattening the diaphragms and mild diffuse slightly nodular thickening of the pulmonary interstitium. No new focal airspace opacities. No pleural effusion or pneumothorax. No evidence of edema. No acute osseus abnormalities. IMPRESSION: 1. Stable examination without acute cardiopulmonary disease. 2. Grossly unchanged appearance of known bilateral upper lobe pulmonary nodules. 3.  Emphysema. (ICD10-J43.9) 4.  Aortic Atherosclerosis (ICD10-170.0) Electronically  Signed   By: Sandi Mariscal M.D.   On: 07/18/2016 11:05   Ct Angio Chest Pe W/cm &/or Wo Cm  Result Date: 07/18/2016 CLINICAL DATA:  Sudden onset of substernal and pleuritic LEFT chest pain 45 minutes ago while watching television, history small cell lung carcinoma, smoking, melanoma, hypertension, asthma EXAM: CT ANGIOGRAPHY CHEST WITH CONTRAST TECHNIQUE: Multidetector CT imaging of the chest was performed using the standard protocol during bolus administration of intravenous contrast. Multiplanar CT image reconstructions and MIPs were obtained to evaluate the vascular anatomy. CONTRAST:  75 cc Isovue 370 IV COMPARISON:  05/11/2016 CT chest FINDINGS: Cardiovascular: Scattered atherosclerotic calcifications aorta, upper normal caliber 3.8 cm diameter. No definite evidence of aneurysm or dissection. Pulmonary arteries well opacified and patent. No  evidence of pulmonary embolism. Minimal pericardial effusion. Mediastinum/Nodes: Esophagus unremarkable. Few normal size mediastinal nodes without definite thoracic adenopathy. Number of LEFT axillary nodes are identified largest 11 mm short axis. Visualized base of cervical region normal appearance Lungs/Pleura: Severe emphysematous changes diffusely. Spiculated nodular density RIGHT upper lobe 17 x 12 mm image 42 previously 17 x 13 mm. Adjacent areas of nodularity in the RIGHT upper lobe measure 17 x 8 mm image 43 previously 19 x 7 mm and 15 x 7 mm image 44 previously 15 x 8 mm. Few scattered calcified pulmonary granulomata are identified bilaterally. A few additional scattered pulmonary nodules which are not definitively calcified are identified in both lungs appear stable as well. No definite enlarging or new pulmonary nodules identified. Upper Abdomen: No significant abnormalities Musculoskeletal: No osseous lesion seen. Review of the MIP images confirms the above findings. IMPRESSION: No evidence pulmonary embolism. Severe emphysematous and old granulomatous disease changes. Stable RIGHT upper lobe nodular foci as well as additional stable BILATERAL pulmonary nodules. No acute abnormalities. Aortic atherosclerosis. Electronically Signed   By: Lavonia Dana M.D.   On: 07/18/2016 12:59   Nm Myocar Multi W/spect W/wall Motion / Ef  Result Date: 07/19/2016  There was no ST segment deviation noted during stress.  No T wave inversion was noted during stress.  The study is normal.  This is a low risk study.  The left ventricular ejection fraction is normal (55-65%).  Suboptimal study due to GI uptake.    Dg C-arm 1-60 Min-no Report  Result Date: 07/22/2016 There is no Radiologist interpretation  for this exam.   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  PLAN:    1. 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 PE with biopsy did not reveal any residual malignancy. No intervention is needed at this time. Return to clinic in 3 months with repeat imaging and further evaluation.  2. Weight loss: Patient was given a prescription for 4 mg Decadron daily.  3. History of melanoma: Patient is a poor historian but appears to have been greater than 3 years ago. Monitor. 4. Pain: Patient will no longer receive narcotics from this clinic. Of note, patient also had several urinary drug screen positive for cocaine. Patient states he does not "get along" with the pain clinic in [place] and has requested a referral to the pain clinic in [place]. He was also given a prescription for gabapentin 300 mg 3 times a day. He was instructed to maintain this dose for several weeks and if no improvement in his pain, he can  consider increasing to 600 mg 3 times a day.  5. Cough: Recommended patient try OTC medications. Patient was previously given a prescription for albuterol and a pulmonary referral. 6. 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 Patient does not complain of this today.   Patient expressed understanding and was in agreement with this plan. He also understands that He can call clinic at any time with any questions, concerns, or complaints.   Lloyd Huger, MD   07/31/2016 2:59 PM

## 2016-07-28 ENCOUNTER — Inpatient Hospital Stay: Payer: Medicaid Other | Attending: Oncology | Admitting: Oncology

## 2016-07-28 VITALS — BP 113/67 | HR 90 | Temp 96.3°F | Resp 18 | Wt 125.9 lb

## 2016-07-28 DIAGNOSIS — F17210 Nicotine dependence, cigarettes, uncomplicated: Secondary | ICD-10-CM | POA: Insufficient documentation

## 2016-07-28 DIAGNOSIS — J449 Chronic obstructive pulmonary disease, unspecified: Secondary | ICD-10-CM | POA: Insufficient documentation

## 2016-07-28 DIAGNOSIS — G8929 Other chronic pain: Secondary | ICD-10-CM | POA: Insufficient documentation

## 2016-07-28 DIAGNOSIS — Z85820 Personal history of malignant melanoma of skin: Secondary | ICD-10-CM | POA: Diagnosis not present

## 2016-07-28 DIAGNOSIS — R918 Other nonspecific abnormal finding of lung field: Secondary | ICD-10-CM

## 2016-07-28 DIAGNOSIS — Z87442 Personal history of urinary calculi: Secondary | ICD-10-CM | POA: Insufficient documentation

## 2016-07-28 DIAGNOSIS — R634 Abnormal weight loss: Secondary | ICD-10-CM | POA: Diagnosis not present

## 2016-07-28 DIAGNOSIS — M109 Gout, unspecified: Secondary | ICD-10-CM | POA: Diagnosis not present

## 2016-07-28 DIAGNOSIS — Z79899 Other long term (current) drug therapy: Secondary | ICD-10-CM | POA: Diagnosis not present

## 2016-07-28 DIAGNOSIS — C3491 Malignant neoplasm of unspecified part of right bronchus or lung: Secondary | ICD-10-CM | POA: Diagnosis not present

## 2016-07-28 DIAGNOSIS — R630 Anorexia: Secondary | ICD-10-CM | POA: Diagnosis not present

## 2016-07-28 DIAGNOSIS — I10 Essential (primary) hypertension: Secondary | ICD-10-CM | POA: Diagnosis not present

## 2016-07-28 MED ORDER — DEXAMETHASONE 4 MG PO TABS: 4.0000 mg | ORAL_TABLET | Freq: Every day | ORAL | 0 refills | Status: DC

## 2016-07-28 MED ORDER — GABAPENTIN 300 MG PO CAPS: 300.0000 mg | ORAL_CAPSULE | Freq: Three times a day (TID) | ORAL | 3 refills | Status: DC

## 2016-07-28 NOTE — Progress Notes (Signed)
Complains of deceased appetite, generalized pain. Pt's significant other is requesting medicaitons like gabapentin and ondansetron ODT to see if will help with pain and appetite.

## 2016-07-30 ENCOUNTER — Ambulatory Visit: Payer: Medicaid Other | Admitting: Oncology

## 2016-07-30 ENCOUNTER — Telehealth: Payer: Self-pay | Admitting: *Deleted

## 2016-07-30 NOTE — Telephone Encounter (Signed)
Sister called for advice on pain management. Dr. Grayland Ormond is no longer managing this patients pain.

## 2016-07-30 NOTE — Telephone Encounter (Signed)
Called David [name] back to educate her regarding the effectiveness of gabapentin. MD will discuss pain management at next visit.

## 2016-08-20 ENCOUNTER — Encounter: Payer: Self-pay | Admitting: *Deleted

## 2016-08-30 ENCOUNTER — Ambulatory Visit: Payer: Medicaid Other | Admitting: Radiation Oncology

## 2016-08-30 ENCOUNTER — Telehealth: Payer: Self-pay | Admitting: *Deleted

## 2016-08-30 NOTE — Telephone Encounter (Signed)
Opened in error

## 2016-09-02 ENCOUNTER — Telehealth: Payer: Self-pay | Admitting: *Deleted

## 2016-09-02 DIAGNOSIS — C3491 Malignant neoplasm of unspecified part of right bronchus or lung: Secondary | ICD-10-CM

## 2016-09-02 MED ORDER — DEXAMETHASONE 4 MG PO TABS: 4.0000 mg | ORAL_TABLET | Freq: Every day | ORAL | 0 refills | Status: DC

## 2016-09-02 NOTE — Telephone Encounter (Signed)
Pt requests refill of decadron and states that it has really helped with his appetite. Please advise if may send in refill. Thanks.

## 2016-09-02 NOTE — Telephone Encounter (Signed)
He can have '4mg'$  decadron.

## 2016-09-02 NOTE — Telephone Encounter (Signed)
Prescription has been escribed to pharmacy.

## 2016-09-07 ENCOUNTER — Ambulatory Visit
Admission: RE | Admit: 2016-09-07 | Discharge: 2016-09-07 | Disposition: A | Discharge status: Home / Self Care | Payer: Medicaid Other | Source: Ambulatory Visit | Attending: Radiation Oncology | Admitting: Radiation Oncology

## 2016-09-07 ENCOUNTER — Encounter: Payer: Self-pay | Admitting: Radiation Oncology

## 2016-09-07 VITALS — BP 112/66 | HR 69 | Temp 95.5°F | Resp 22 | Wt 137.8 lb

## 2016-09-07 DIAGNOSIS — F17210 Nicotine dependence, cigarettes, uncomplicated: Secondary | ICD-10-CM | POA: Diagnosis not present

## 2016-09-07 DIAGNOSIS — C3411 Malignant neoplasm of upper lobe, right bronchus or lung: Secondary | ICD-10-CM | POA: Insufficient documentation

## 2016-09-07 DIAGNOSIS — C7951 Secondary malignant neoplasm of bone: Secondary | ICD-10-CM | POA: Insufficient documentation

## 2016-09-07 DIAGNOSIS — R918 Other nonspecific abnormal finding of lung field: Secondary | ICD-10-CM | POA: Insufficient documentation

## 2016-09-07 DIAGNOSIS — Z923 Personal history of irradiation: Secondary | ICD-10-CM | POA: Diagnosis not present

## 2016-09-07 DIAGNOSIS — C3491 Malignant neoplasm of unspecified part of right bronchus or lung: Secondary | ICD-10-CM

## 2016-09-07 NOTE — Progress Notes (Signed)
Radiation Oncology Follow up Note  Name: David [name]   Date:   09/07/2016 MRN:  [id number] DOB: Feb 23, 1956    This 61 y.o. 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  REFERRING PROVIDER: Lloyd Huger, MD  HPI: Patient is a 61 year old male now out 9 months having completed combined modality treatment with chemoradiation for small cell lung cancer as well as whole brain radiation. He is seen today in routine follow-up and is doing well still has problems with putting on weight specifically denies cough hemoptysis or chest tightness.. He had a CT scan last month for PE protocol showing stable right upper lobe nodule as well as stable bilateral pulmonary nodules.  COMPLICATIONS OF TREATMENT: none  FOLLOW UP COMPLIANCE: keeps appointments   PHYSICAL EXAM:  BP 112/66   Pulse 69   Temp (!) 95.5 F (35.3 C)   Resp (!) 22   Wt 137 lb 12.6 oz (62.5 kg)   BMI 20.35 kg/m  Well-developed well-nourished patient in NAD. HEENT reveals PERLA, EOMI, discs not visualized.  Oral cavity is clear. No oral mucosal lesions are identified. Neck is clear without evidence of cervical or supraclavicular adenopathy. Lungs are clear to A&P. Cardiac examination is essentially unremarkable with regular rate and rhythm without murmur rub or thrill. Abdomen is benign with no organomegaly or masses noted. Motor sensory and DTR levels are equal and symmetric in the upper and lower extremities. Cranial nerves II through XII are grossly intact. Proprioception is intact. No peripheral adenopathy or edema is identified. No motor or sensory levels are noted. Crude visual fields are within normal range.  RADIOLOGY RESULTS: CT scans are reviewed compatible with the above-stated findings  PLAN: Present time patient is doing well with CT evidence of stable disease. I'm please was overall progress. I've  encouraged him to increase his nutritional intake with smaller meals for 5 times a day. I've asked to see him back in 6 months for follow-up. Will obtain a CT scan at that time if not already been performed. Patient knows to call sooner with any concerns.  I would like to take this opportunity to thank you for allowing me to participate in the care of your patient.Armstead Peaks., MD

## 2016-09-14 ENCOUNTER — Ambulatory Visit: Payer: Medicaid Other | Admitting: Pain Medicine

## 2016-09-28 ENCOUNTER — Other Ambulatory Visit: Payer: Self-pay | Admitting: *Deleted

## 2016-09-28 ENCOUNTER — Ambulatory Visit: Payer: Medicaid Other | Admitting: Pain Medicine

## 2016-09-28 DIAGNOSIS — R209 Unspecified disturbances of skin sensation: Secondary | ICD-10-CM | POA: Insufficient documentation

## 2016-09-28 DIAGNOSIS — G894 Chronic pain syndrome: Secondary | ICD-10-CM | POA: Insufficient documentation

## 2016-09-28 DIAGNOSIS — C3491 Malignant neoplasm of unspecified part of right bronchus or lung: Secondary | ICD-10-CM

## 2016-09-28 DIAGNOSIS — Z79891 Long term (current) use of opiate analgesic: Secondary | ICD-10-CM | POA: Insufficient documentation

## 2016-09-28 MED ORDER — DEXAMETHASONE 4 MG PO TABS: 4.0000 mg | ORAL_TABLET | Freq: Every day | ORAL | 0 refills | Status: DC

## 2016-09-28 NOTE — Progress Notes (Deleted)
Patient's Name: David [name]  MRN: [id number]  Referring Provider: Lloyd Huger, MD  DOB: 1956/06/16  PCP: Olin Hauser, DO  DOS: 09/28/2016  Note by: Kathlen Brunswick. Dossie Arbour, MD  Service setting: Ambulatory outpatient  Specialty: Interventional Pain Management  Location: [facility] (AMB) Pain Management Facility    Patient type: New Patient   Primary Reason(s) for Visit: Initial Patient Evaluation CC: No chief complaint on file.  HPI  David [name] is a 61 y.o. year old, male patient, who comes today for an initial evaluation. He has Small cell lung cancer ([place]); Tobacco abuse; History of melanoma; COPD (chronic obstructive pulmonary disease) ([place]); Gout; Adjustment disorder; Insomnia; Chronic pain due to neoplasm; Opiate use; Primary osteoarthritis involving multiple joints; Chronic bursitis of left shoulder; Chronic pain of both shoulders; Polysubstance abuse; Chest pain; Anemia; Multiple lung nodules; Lung mass; Chronic pain syndrome; Long term current use of opiate analgesic; Long term prescription opiate use; and Disturbance of skin sensation on his problem list.. His primarily concern today is the No chief complaint on file.  Pain Assessment: Self-Reported Pain Score:  /10             Reported level is compatible with observation.          Onset and Duration: {Hx; Onset and Duration:210120511} Cause of pain: {Hx; Cause:210120521} Severity: {Pain Severity:210120502} Timing: {Symptoms; Timing:210120501} Aggravating Factors: {Causes; Aggravating pain factors:210120507} Alleviating Factors: {Causes; Alleviating Factors:210120500} Associated Problems: {Hx; Associated problems:210120515} Quality of Pain: {Hx; Symptom quality or Descriptor:210120531} Previous Examinations or Tests: {Hx; Previous examinations or test:210120529} Previous Treatments: {Hx; Previous Treatment:210120503}  The patient comes into the clinics today for the first time for a chronic pain management  evaluation. ***  Today I took the time to provide the patient with information regarding my pain practice. The patient was informed that my practice is divided into two sections: an interventional pain management section, as well as a completely separate and distinct medication management section. The interventional portion of my practice takes place on Tuesdays and Thursdays, while the medication management is conducted on Mondays and Wednesdays. Because of the amount of documentation required on both them, they are kept separated. This means that there is the possibility that the patient may be scheduled for a procedure on Tuesday, while also having a medication management appointment on Wednesday. I have also informed the patient that because of current staffing and facility limitations, I no longer take patients for medication management only. To illustrate the reasons for this, I gave the patient the example of a surgeon and how inappropriate it would be to refer a patient to his/her practice so that they write for the post-procedure antibiotics on a surgery done by someone else.   The patient was informed that joining my practice means that they are open to any and all interventional therapies. I clarified for the patient that this does not mean that they will be forced to have any procedures done. What it means is that patients looking for a practitioner to simply write for their pain medications and not take advantage of other interventional techniques will be better served by a different practitioner, other than myself. I made it clear that I prefer to spend my time providing those services that I specialize in.  The patient was also made aware of my Comprehensive Pain Management Safety Guidelines where by joining my practice, they limit all of their nerve blocks and joint injections to those done by our practice, for as long as  we are retained to manage their care.   Historic Controlled Substance  Pharmacotherapy Review  PMP and historical list of controlled substances: *** Highest analgesic regimen found: *** Most recent analgesic: *** Highest recorded MME/day: *** mg/day MME/day: *** mg/day Medications: The patient did not bring the medication(s) to the appointment, as requested in our "New Patient Package" Pharmacodynamics: Desired effects: Analgesia: The patient reports >50% benefit. Reported improvement in function: The patient reports medication allows him to accomplish basic ADLs. Clinically meaningful improvement in function (CMIF): Sustained CMIF goals met Perceived effectiveness: Described as relatively effective, allowing for increase in activities of daily living (ADL) Undesirable effects: Side-effects or Adverse reactions: None reported Historical Monitoring: The patient  reports that he uses drugs, including Cocaine.. Lab Results  Component Value Date   MDMA NONE DETECTED 07/22/2016   MDMA NONE DETECTED 07/18/2016   MDMA NEGATIVE 05/23/2014   COCAINSCRNUR NONE DETECTED 07/22/2016   COCAINSCRNUR NONE DETECTED 07/18/2016   COCAINSCRNUR POSITIVE 05/23/2014   PCPSCRNUR NONE DETECTED 07/22/2016   PCPSCRNUR NONE DETECTED 07/18/2016   PCPSCRNUR NEGATIVE 05/23/2014   THCU NONE DETECTED 07/22/2016   THCU NONE DETECTED 07/18/2016   THCU NEGATIVE 05/23/2014   Historical Background Evaluation: [place] PDMP: Six (6) year initial data search conducted.             [place] Department of public safety, offender search: Editor, commissioning Information) Non-contributory Risk Assessment Profile: Aberrant behavior: None observed or detected today Risk factors for fatal opioid overdose: None identified today Fatal overdose hazard ratio (HR): Calculation deferred Non-fatal overdose hazard ratio (HR): Calculation deferred Risk of opioid abuse or dependence: 0.7-3.0% with doses ? 36 MME/day and 6.1-26% with doses ? 120 MME/day. Substance use disorder (SUD) risk level: Pending results of Medical  Psychology Evaluation for SUD Opioid risk tool (ORT) (Total Score):    ORT Scoring interpretation table:  Score <3 = Low Risk for SUD  Score between 4-7 = Moderate Risk for SUD  Score >8 = High Risk for Opioid Abuse   PHQ-2 Depression Scale:  Total score:    PHQ-2 Scoring interpretation table: (Score and probability of major depressive disorder)  Score 0 = No depression  Score 1 = 15.4% Probability  Score 2 = 21.1% Probability  Score 3 = 38.4% Probability  Score 4 = 45.5% Probability  Score 5 = 56.4% Probability  Score 6 = 78.6% Probability   PHQ-9 Depression Scale:  Total score:    PHQ-9 Scoring interpretation table:  Score 0-4 = No depression  Score 5-9 = Mild depression  Score 10-14 = Moderate depression  Score 15-19 = Moderately severe depression  Score 20-27 = Severe depression (2.4 times higher risk of SUD and 2.89 times higher risk of overuse)   Pharmacologic Plan: Pending ordered tests and/or consults  Meds  The patient has a current medication list which includes the following prescription(s): albuterol, dexamethasone, feeding supplement (ensure enlive), gabapentin, ibuprofen, meloxicam, oxycodone hcl, pantoprazole, and tiotropium.  Current Outpatient Prescriptions on File Prior to Visit  Medication Sig  . albuterol (PROVENTIL HFA;VENTOLIN HFA) 108 (90 Base) MCG/ACT inhaler Inhale 2 puffs into the lungs every 4 (four) hours as needed for wheezing or shortness of breath.  . dexamethasone (DECADRON) 4 MG tablet Take 1 tablet (4 mg total) by mouth daily.  . feeding supplement, ENSURE ENLIVE, (ENSURE ENLIVE) LIQD Take 237 mLs by mouth 3 (three) times daily between meals.  . gabapentin (NEURONTIN) 300 MG capsule Take 1 capsule (300 mg total) by mouth 3 (three) times daily.  Marland Kitchen  ibuprofen (ADVIL,MOTRIN) 200 MG tablet Take 800 mg by mouth every 8 (eight) hours.  . meloxicam (MOBIC) 15 MG tablet Take 1 tablet (15 mg total) by mouth every morning. (Patient taking differently:  Take 15 mg by mouth daily as needed for pain. )  . oxyCODONE 10 MG TABS Take 1 tablet (10 mg total) by mouth 3 (three) times daily. (Patient not taking: Reported on 07/28/2016)  . pantoprazole (PROTONIX) 40 MG tablet Take 1 tablet (40 mg total) by mouth daily.  Marland Kitchen tiotropium (SPIRIVA) 18 MCG inhalation capsule Place 1 capsule (18 mcg total) into inhaler and inhale daily.   No current facility-administered medications on file prior to visit.    Imaging Review  Shoulder Imaging: Shoulder-R DG:  Results for orders placed during the hospital encounter of 02/27/16  DG Shoulder Right   Narrative CLINICAL DATA:  Pain.  EXAM: RIGHT SHOULDER - 2+ VIEW  COMPARISON:  None.  FINDINGS: There is no evidence of fracture or dislocation. There is no evidence of arthropathy or other focal bone abnormality. Soft tissues are unremarkable.  IMPRESSION: Negative.   Electronically Signed   By: Dorise Bullion III M.D   On: 02/27/2016 22:42    Spine Imaging: CT-Guided Biopsy:  Results for orders placed during the hospital encounter of 08/28/15  CT Biopsy   Narrative INDICATION: 61 year old male smoker with a hypermetabolic right upper lobe pulmonary nodule concerning for primary bronchogenic carcinoma.  EXAM: CT-guided biopsy right upper lobe lobe pulmonary nodule  Interventional Radiologist:  Criselda Peaches, MD  MEDICATIONS: None.  ANESTHESIA/SEDATION: Fentanyl 75 mcg IV; Versed 3 mg IV  Moderate Sedation Time:  20  The patient was continuously monitored during the procedure by the interventional radiology nurse under my direct supervision.  FLUOROSCOPY TIME:  None  COMPLICATIONS: None immediate.  Estimated blood loss:  0  PROCEDURE: Informed written consent was obtained from the patient after a thorough discussion of the procedural risks, benefits and alternatives. All questions were addressed. Maximal Sterile Barrier Technique was utilized including caps, mask,  sterile gowns, sterile gloves, sterile drape, hand hygiene and skin antiseptic. A timeout was performed prior to the initiation of the procedure.  A planning axial CT scan was performed. The nodule in the right upper lobe was successfully identified. A suitable skin entry site was selected and marked. The region was then sterilely prepped and draped in standard fashion using Betadine skin prep. Local anesthesia was attained by infiltration with 1% lidocaine. A small dermatotomy was made. Under intermittent CT fluoroscopic guidance, a 17 gauge trocar needle was advanced into the lung and positioned at the margin of the nodule.  Multiple 18 gauge core biopsies were then coaxially obtained using the BioPince automated biopsy device. Biopsy specimens were placed in formalin and delivered to pathology for further analysis. A bio sentry device was successfully deployed. Post biopsy axial CT imaging demonstrates no evidence of immediate complication. There is no pneumothorax. Mild perilesional alveolar hemorrhage is not unexpected. The patient tolerated the procedure well.  IMPRESSION: Technically successful CT-guided biopsy right upper lobe pulmonary nodule.  Signed,  Criselda Peaches, MD  [facility]  [facility]   Electronically Signed   By: Jacqulynn Cadet M.D.   On: 09/01/2015 17:05    Hip Imaging: Hip-L DG 2-3 views:  Results for orders placed during the hospital encounter of 02/27/16  DG HIP UNILAT WITH PELVIS 2-3 VIEWS LEFT   Narrative CLINICAL DATA:  Pain without trauma  EXAM: DG HIP (  WITH OR WITHOUT PELVIS) 2-3V LEFT  COMPARISON:  None.  FINDINGS: There is no evidence of hip fracture or dislocation. There is no evidence of arthropathy or other focal bone abnormality.  IMPRESSION: Negative.   Electronically Signed   By: Dorise Bullion III M.D   On: 02/27/2016 22:43    Note: Available results from  prior imaging studies were reviewed.        ROS  Cardiovascular History: {Hx; Cardiovascular History:210120525} Pulmonary or Respiratory History: {Hx; Pumonary and/or Respiratory History:210120523} Neurological History: {Hx; Neurological:210120504} Review of Past Neurological Studies:  Results for orders placed or performed during the hospital encounter of 04/04/16  CT Head Wo Contrast   Narrative   CLINICAL DATA:  Worsening headache for 1 week after radiation treatment. Chest pain and dyspnea.  EXAM: CT HEAD WITHOUT CONTRAST  TECHNIQUE: Contiguous axial images were obtained from the base of the skull through the vertex without intravenous contrast.  COMPARISON:  MRI of the brain Jan 01, 2016.  FINDINGS: BRAIN: The ventricles and sulci are normal. No intraparenchymal hemorrhage, mass effect nor midline shift. No acute large vascular territory infarcts. No abnormal extra-axial fluid collections. Basal cisterns are patent.  VASCULAR: Unremarkable.  SKULL/SOFT TISSUES: No skull fracture. No significant soft tissue swelling. Patient is edentulous.  ORBITS/SINUSES: The included ocular globes and orbital contents are normal.The mastoid aircells and included paranasal sinuses are well-aerated.  OTHER: None.  IMPRESSION: Negative CT HEAD.   Electronically Signed   By: Elon Alas M.D.   On: 04/04/2016 05:46   Results for orders placed or performed during the hospital encounter of 12/31/15  MR Brain W Wo Contrast   Narrative   CLINICAL DATA:  Small cell lung cancer. Headache. On chemotherapy. Sepsis.  EXAM: MRI HEAD WITHOUT AND WITH CONTRAST  TECHNIQUE: Multiplanar, multiecho pulse sequences of the brain and surrounding structures were obtained without and with intravenous contrast.  CONTRAST:  50m MULTIHANCE GADOBENATE DIMEGLUMINE 529 MG/ML IV SOLN  COMPARISON:  CT head 12/05/2005  FINDINGS: Ventricle size is normal.  Cerebral volume normal for  age.  Negative for acute infarct.  No significant chronic ischemia.  Negative for intracranial hemorrhage or fluid collection.  Negative for mass or edema.  Postcontrast imaging degraded by motion. No enhancing mass lesion identified. Negative for metastatic disease. Negative calvarium.  Extensive mucosal edema in the paranasal sinuses diffusely. Right mastoid sinus effusion. Small left mastoid effusion.  IMPRESSION: Negative for metastatic disease.  No acute intracranial abnormality  Sinusitis   Electronically Signed   By: CFranchot GalloM.D.   On: 01/01/2016 11:02    Psychological-Psychiatric History: {Hx; Psychological-Psychiatric History:210120512} Gastrointestinal History: {Hx; Gastrointestinal:210120527} Genitourinary History: {Hx; Genitourinary:210120506} Hematological History: {Hx; Hematological:210120510} Endocrine History: {Hx; Endocrine history:210120509} Rheumatologic History: {Hx; Rheumatological:210120530} Musculoskeletal History: {Hx; Musculoskeletal:210120528} Work History: {Hx; Work history:210120514}  Allergies  David [name] allergic to tylenol [acetaminophen] and vicodin [hydrocodone-acetaminophen].  Laboratory Chemistry  Inflammation Markers No results found for: ESRSEDRATE, CRP Renal Function Lab Results  Component Value Date   BUN 11 07/19/2016   CREATININE 0.91 07/19/2016   GFRAA >60 07/19/2016   GFRNONAA >60 07/19/2016   Hepatic Function Lab Results  Component Value Date   AST 15 07/19/2016   ALT 9 (L) 07/19/2016   ALBUMIN 2.9 (L) 07/19/2016   Electrolytes Lab Results  Component Value Date   NA 142 07/19/2016   K 4.1 07/19/2016   CL 107 07/19/2016   CALCIUM 8.8 (L) 07/19/2016   Pain Modulating Vitamins No results found for: VD25OH,  [id number], MG8676PP5, KD3267TI4, 25OHVITD1, 25OHVITD2, 25OHVITD3, [id number] Coagulation Parameters Lab Results  Component Value Date   INR 1.01 12/31/2015   LABPROT 13.5 12/31/2015   APTT 39  (H) 12/31/2015   PLT 276 07/19/2016   Cardiovascular Lab Results  Component Value Date   BNP 322.0 (H) 12/31/2015   HGB 10.0 (L) 07/19/2016   HCT 29.3 (L) 07/19/2016   Note: Lab results reviewed.  [place]  Drug: David [name]  reports that he uses drugs, including Cocaine. Alcohol:  reports that he does not drink alcohol. Tobacco:  reports that he has been smoking Cigarettes.  He started smoking about 22 years ago. He has a 51.00 pack-year smoking history. He has never used smokeless tobacco. Medical:  has a past medical history of Arthritis; Asthma; COPD (chronic obstructive pulmonary disease) ([place]); Gout; History of kidney stones; History of melanoma; Hypertension; Oxygen deficiency; Pain; Small cell lung cancer ([place]); and Tobacco use. Family: family history includes Cirrhosis (age of onset: 18) in his father; Heart attack (age of onset: 86) in his mother; Lung cancer in his mother.  Past Surgical History:  Procedure Laterality Date  . ELECTROMAGNETIC NAVIGATION BROCHOSCOPY N/A 07/22/2016   Procedure: ELECTROMAGNETIC NAVIGATION BRONCHOSCOPY;  Surgeon: Flora Lipps, MD;  Location: [facility] ORS;  Service: Cardiopulmonary;  Laterality: N/A;  . PERIPHERAL VASCULAR CATHETERIZATION N/A 09/15/2015   Procedure: Glori Luis Cath Insertion;  Surgeon: Algernon Huxley, MD;  Location: [place] [facility];  Service: Cardiovascular;  Laterality: N/A;  . SKIN GRAFT     [facility]   Active Ambulatory Problems    Diagnosis Date Noted  . Small cell lung cancer ([place]) 09/08/2015  . Tobacco abuse 04/20/2016  . History of melanoma 04/20/2016  . COPD (chronic obstructive pulmonary disease) ([place]) 04/20/2016  . Gout 04/20/2016  . Adjustment disorder 04/20/2016  . Insomnia 04/20/2016  . Chronic pain due to neoplasm 04/20/2016  . Opiate use 04/20/2016  . Primary osteoarthritis involving multiple joints 06/23/2016  . Chronic bursitis of left shoulder 06/23/2016  . Chronic pain of both shoulders 06/23/2016  . Polysubstance abuse  06/23/2016  . Chest pain 07/18/2016  . Anemia 07/19/2016  . Multiple lung nodules 07/19/2016  . Lung mass   . Chronic pain syndrome 09/28/2016  . Long term current use of opiate analgesic 09/28/2016  . Long term prescription opiate use 09/28/2016  . Disturbance of skin sensation 09/28/2016   Resolved Ambulatory Problems    Diagnosis Date Noted  . Sepsis ([place]) 12/31/2015   Past Medical History:  Diagnosis Date  . Arthritis   . Asthma   . COPD (chronic obstructive pulmonary disease) ([place])   . Gout   . History of kidney stones   . History of melanoma   . Hypertension   . Oxygen deficiency   . Pain   . Small cell lung cancer ([place])   . Tobacco use    Constitutional Exam  General appearance: Well nourished, well developed, and well hydrated. In no apparent acute distress There were no vitals filed for this visit. BMI Assessment: Estimated body mass index is 20.35 kg/m as calculated from the following:   Height as of 07/18/16: _0  (1.753 m).   Weight as of 09/07/16: 137 lb 12.6 oz (62.5 kg).  BMI interpretation table: BMI level Category Range association with higher incidence of chronic pain  <18 kg/m2 Underweight   18.5-24.9 kg/m2 Ideal body weight   25-29.9 kg/m2 Overweight Increased incidence by 20%  30-34.9 kg/m2 Obese (Class I) Increased incidence by 68%  35-39.9 kg/m2 Severe obesity (Class II) Increased incidence by 136%  >40 kg/m2 Extreme obesity (Class III) Increased incidence by 254%   BMI Readings from Last 4 Encounters:  09/07/16 20.35 kg/m  07/28/16 18.59 kg/m  07/19/16 18.71 kg/m  07/15/16 18.61 kg/m   Wt Readings from Last 4 Encounters:  09/07/16 137 lb 12.6 oz (62.5 kg)  07/28/16 125 lb 14.1 oz (57.1 kg)  07/19/16 126 lb 11.2 oz (57.5 kg)  07/15/16 126 lb (57.2 kg)  Psych/Mental status: Alert, oriented x 3 (person, place, & time)       Eyes: PERLA Respiratory: No evidence of acute respiratory distress  Cervical Spine Exam  Inspection: No  masses, redness, or swelling Alignment: Symmetrical Functional ROM: Unrestricted ROM Stability: No instability detected Muscle strength & Tone: Functionally intact Sensory: Unimpaired Palpation: Non-contributory  Upper Extremity (UE) Exam    Side: Right upper extremity  Side: Left upper extremity  Inspection: No masses, redness, swelling, or asymmetry. No contractures  Inspection: No masses, redness, swelling, or asymmetry. No contractures  Functional ROM: Unrestricted ROM          Functional ROM: Unrestricted ROM          Muscle strength & Tone: Functionally intact  Muscle strength & Tone: Functionally intact  Sensory: Unimpaired  Sensory: Unimpaired  Palpation: Euthermic  Palpation: Euthermic  Specialized Test(s): Deferred         Specialized Test(s): Deferred          Thoracic Spine Exam  Inspection: No masses, redness, or swelling Alignment: Symmetrical Functional ROM: Unrestricted ROM Stability: No instability detected Sensory: Unimpaired Muscle strength & Tone: Functionally intact Palpation: Non-contributory  Lumbar Spine Exam  Inspection: No masses, redness, or swelling Alignment: Symmetrical Functional ROM: Unrestricted ROM Stability: No instability detected Muscle strength & Tone: Functionally intact Sensory: Unimpaired Palpation: Non-contributory Provocative Tests: Lumbar Hyperextension and rotation test: evaluation deferred today       Patrick's Maneuver: evaluation deferred today              Gait & Posture Assessment  Ambulation: Unassisted Gait: Relatively normal for age and body habitus Posture: WNL   Lower Extremity Exam    Side: Right lower extremity  Side: Left lower extremity  Inspection: No masses, redness, swelling, or asymmetry. No contractures  Inspection: No masses, redness, swelling, or asymmetry. No contractures  Functional ROM: Unrestricted ROM          Functional ROM: Unrestricted ROM          Muscle strength & Tone: Functionally intact   Muscle strength & Tone: Functionally intact  Sensory: Unimpaired  Sensory: Unimpaired  Palpation: No palpable anomalies  Palpation: No palpable anomalies   Assessment  Primary Diagnosis & Pertinent Problem List: The primary encounter diagnosis was Chronic pain syndrome. Diagnoses of Long term current use of opiate analgesic, Long term prescription opiate use, Opiate use, Polysubstance abuse, and Disturbance of skin sensation were also pertinent to this visit.  Visit Diagnosis: 1. Chronic pain syndrome   2. Long term current use of opiate analgesic   3. Long term prescription opiate use   4. Opiate use   5. Polysubstance abuse   6. Disturbance of skin sensation    Plan of Care  Initial treatment plan:  Please be advised that as per protocol, today's visit has been an evaluation only. We have not taken over the patient's controlled substance management.  Problem-specific plan: No problem-specific Assessment & Plan notes found for this encounter.  Ordered Lab-work,  Procedure(s), Referral(s), & Consult(s): No orders of the defined types were placed in this encounter.  Pharmacotherapy: Medications ordered:  No orders of the defined types were placed in this encounter.  Medications administered during this visit: David [name] had no medications administered during this visit.   Pharmacotherapy under consideration:  Opioid Analgesics: The patient was informed that there is no guarantee that he would be a candidate for opioid analgesics. The decision will be made following CDC guidelines. This decision will be based on the results of diagnostic studies, as well as David [name] risk profile.  Membrane stabilizer: To be determined at a later time Muscle relaxant: To be determined at a later time NSAID: To be determined at a later time Other analgesic(s): To be determined at a later time   Interventional therapies under consideration: David [name] was informed that there is no guarantee  that he would be a candidate for interventional therapies. The decision will be based on the results of diagnostic studies, as well as David [name] risk profile.  Possible procedure(s): ***   Provider-requested follow-up: No Follow-up on file.  Future Appointments Date Time Provider [place]  09/28/2016 10:15 AM Milinda Pointer, MD [facility] None  10/25/2016 10:00 AM [facility]  11/03/2016 10:00 AM [place] Huger, MD [facility] None  03/16/2017 9:00 AM Noreene Filbert, [facility] None    Primary Care Physician: Olin Hauser, DO Location: [facility] Facility Note by: Kathlen Brunswick. Dossie Arbour, M.D, DABA, DABAPM, DABPM, DABIPP, FIPP Date: 09/28/2016; Time: 8:32 AM  Pain Score Disclaimer: We use the NRS-11 scale. This is a self-reported, subjective measurement of pain severity with only modest accuracy. It is used primarily to identify changes within a particular patient. It must be understood that outpatient pain scales are significantly less accurate that those used for research, where they can be applied under ideal controlled circumstances with minimal exposure to variables. In reality, the score is likely to be a combination of pain intensity and pain affect, where pain affect describes the degree of emotional arousal or changes in action readiness caused by the sensory experience of pain. Factors such as social and work situation, setting, emotional state, anxiety levels, expectation, and prior pain experience may influence pain perception and show large inter-individual differences that may also be affected by time variables.  Patient instructions provided during this appointment: There are no Patient Instructions on file for this visit.

## 2016-10-25 ENCOUNTER — Ambulatory Visit: Admission: RE | Admit: 2016-10-25 | Payer: Medicaid Other | Source: Ambulatory Visit

## 2016-10-27 ENCOUNTER — Ambulatory Visit: Payer: Medicaid Other | Admitting: Oncology

## 2016-10-29 ENCOUNTER — Ambulatory Visit
Admission: RE | Admit: 2016-10-29 | Discharge: 2016-10-29 | Disposition: A | Discharge status: Home / Self Care | Payer: Medicaid Other | Source: Ambulatory Visit | Attending: Oncology | Admitting: Oncology

## 2016-10-29 DIAGNOSIS — C3491 Malignant neoplasm of unspecified part of right bronchus or lung: Secondary | ICD-10-CM | POA: Diagnosis not present

## 2016-10-29 DIAGNOSIS — J439 Emphysema, unspecified: Secondary | ICD-10-CM | POA: Insufficient documentation

## 2016-10-29 DIAGNOSIS — I2510 Atherosclerotic heart disease of native coronary artery without angina pectoris: Secondary | ICD-10-CM | POA: Diagnosis not present

## 2016-10-29 DIAGNOSIS — D71 Functional disorders of polymorphonuclear neutrophils: Secondary | ICD-10-CM | POA: Insufficient documentation

## 2016-10-29 DIAGNOSIS — I700 Atherosclerosis of aorta: Secondary | ICD-10-CM | POA: Diagnosis not present

## 2016-10-29 MED ORDER — IOPAMIDOL (ISOVUE-300) INJECTION 61%
75.0000 mL | Freq: Once | INTRAVENOUS | Status: AC | PRN
  Administered 2016-10-29: 75 mL via INTRAVENOUS

## 2016-11-01 ENCOUNTER — Other Ambulatory Visit: Payer: Self-pay | Admitting: *Deleted

## 2016-11-01 ENCOUNTER — Telehealth: Payer: Self-pay | Admitting: Family Medicine

## 2016-11-01 DIAGNOSIS — M25512 Pain in left shoulder: Principal | ICD-10-CM

## 2016-11-01 DIAGNOSIS — G8929 Other chronic pain: Secondary | ICD-10-CM

## 2016-11-01 DIAGNOSIS — M150 Primary generalized (osteo)arthritis: Secondary | ICD-10-CM

## 2016-11-01 DIAGNOSIS — M25511 Pain in right shoulder: Principal | ICD-10-CM

## 2016-11-01 DIAGNOSIS — C3491 Malignant neoplasm of unspecified part of right bronchus or lung: Secondary | ICD-10-CM

## 2016-11-01 DIAGNOSIS — M159 Polyosteoarthritis, unspecified: Secondary | ICD-10-CM

## 2016-11-01 MED ORDER — MELOXICAM 15 MG PO TABS: 15.0000 mg | ORAL_TABLET | Freq: Every day | ORAL | 5 refills | Status: DC | PRN

## 2016-11-01 MED ORDER — GABAPENTIN 300 MG PO CAPS: 300.0000 mg | ORAL_CAPSULE | Freq: Three times a day (TID) | ORAL | 0 refills | Status: DC

## 2016-11-01 MED ORDER — DEXAMETHASONE 4 MG PO TABS: 4.0000 mg | ORAL_TABLET | Freq: Every day | ORAL | 0 refills | Status: DC

## 2016-11-01 NOTE — Telephone Encounter (Signed)
Pt needs a refill on meloxicam sent to [facility]  If you have any questions please call Cassandra [phone]

## 2016-11-01 NOTE — Telephone Encounter (Signed)
Refilled Meloxicam.  Nobie Putnam, DO [facility] 11/01/2016, 5:02 PM

## 2016-11-02 NOTE — Progress Notes (Signed)
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  INTERVAL HISTORY: Patient returns to clinic today for routine evaluation and discussion of his imaging results. He currently feels 61 well. His appetite has improved and he is gaining weight. He does not complain of headache. He has no neurologic complaints. He denies any fevers. He denies any chest pain, shortness of breath, cough, or hemoptysis. He denies any nausea, vomiting, constipation, or diarrhea. He has no urinary complaints. Patient offers no further specific complaints.  REVIEW OF SYSTEMS:   Review of Systems  Constitutional: Negative for fever, malaise/fatigue and weight loss.  HENT: Negative for tinnitus.   Respiratory: Negative for cough, hemoptysis and shortness of breath.   Cardiovascular: Negative.  Negative for chest pain and leg swelling.  Gastrointestinal: Negative.  Negative for abdominal pain.  Genitourinary: Negative.   Musculoskeletal: Negative.  Negative for joint pain.  Neurological: Negative.  Negative for weakness and headaches.  Psychiatric/Behavioral: Negative.  The patient is not nervous/anxious.     As per HPI. Otherwise, a complete review of systems is negative.  PAST MEDICAL HISTORY: Past Medical History:  Diagnosis Date  . Arthritis   . Asthma   . COPD (chronic obstructive pulmonary disease) (HCC)    not on home o2  . Gout   . History of kidney stones   . History of melanoma   . Hypertension   . Oxygen deficiency    O2 1 LITER [facility]  . Pain    chronic pain all over  . Small cell lung cancer (HCC)    melanoma  . Tobacco use     PAST SURGICAL HISTORY: Past Surgical History:  Procedure Laterality Date  . ELECTROMAGNETIC NAVIGATION BROCHOSCOPY N/A  07/22/2016   Procedure: ELECTROMAGNETIC NAVIGATION BRONCHOSCOPY;  Surgeon: Flora Lipps, MD;  Location: [facility] ORS;  Service: Cardiopulmonary;  Laterality: N/A;  . PERIPHERAL VASCULAR CATHETERIZATION N/A 09/15/2015   Procedure: Glori Luis Cath Insertion;  Surgeon: Algernon Huxley, MD;  Location: [place] [facility];  Service: Cardiovascular;  Laterality: N/A;  . SKIN GRAFT     [facility]    FAMILY HISTORY: Reviewed and unchanged. No reported history of malignancy or chronic disease.     ADVANCED DIRECTIVES:    HEALTH MAINTENANCE: Social History  Substance Use Topics  . Smoking status: Current Every Day Smoker    Packs/day: 1.00    Years: 51.00    Types: Cigarettes    Start date: 08/12/1994  . Smokeless tobacco: Never Used     Comment: used to smoke upto 2PPD, recently cut down  . Alcohol use No     Colonoscopy:  PAP:  Bone density:  Lipid panel:  Allergies  Allergen Reactions  . Tylenol [Acetaminophen] Other (See Comments)    Unable to take due to chemotherapy regimen  . Vicodin [Hydrocodone-Acetaminophen] Nausea And Vomiting and Rash    Current Outpatient Prescriptions  Medication Sig Dispense Refill  . albuterol (PROVENTIL HFA;VENTOLIN HFA) 108 (90 Base) MCG/ACT inhaler Inhale 2 puffs into the lungs every 4 (four) hours as needed for wheezing or shortness of breath. 1 Inhaler 6  . dexamethasone (DECADRON) 4 MG tablet Take 1 tablet (4 mg total) by mouth daily. 30 tablet 0  . gabapentin (NEURONTIN) 300 MG capsule Take 1 capsule (300 mg total)  by mouth 3 (three) times daily. 90 capsule 0  . ibuprofen (ADVIL,MOTRIN) 200 MG tablet Take 800 mg by mouth every 8 (eight) hours.    . meloxicam (MOBIC) 15 MG tablet Take 1 tablet (15 mg total) by mouth daily as needed for pain. 30 tablet 5  . oxyCODONE 10 MG TABS Take 1 tablet (10 mg total) by mouth 3 (three) times daily. 15 tablet 0  . pantoprazole (PROTONIX) 40 MG tablet Take 1 tablet (40 mg total) by mouth daily. 30 tablet 5  . tiotropium  (SPIRIVA) 18 MCG inhalation capsule Place 1 capsule (18 mcg total) into inhaler and inhale daily. 30 capsule 5  . feeding supplement, ENSURE ENLIVE, (ENSURE ENLIVE) LIQD Take 237 mLs by mouth 3 (three) times daily between meals. (Patient not taking: Reported on 11/03/2016) 237 mL 12   No current facility-administered medications for this visit.     OBJECTIVE: Vitals:   11/03/16 1011  BP: 114/71  Pulse: 78  Resp: 18  Temp: 97.5 F (36.4 C)     Body mass index is 21.22 kg/m.    ECOG FS:0 - Asymptomatic  General: Thin, no acute distress. Eyes: Pink conjunctiva, anicteric sclera. Lungs: Clear to auscultation bilaterally. Heart: Regular rate and rhythm. No rubs, murmurs, or gallops. Abdomen: Soft, nontender, nondistended. No organomegaly noted, normoactive bowel sounds. Musculoskeletal: No edema, cyanosis, or clubbing. Neuro: Alert, answering all questions appropriately. Cranial nerves grossly intact. Skin: No rashes or petechiae noted. Psych: Normal affect.  LAB RESULTS:  Lab Results  Component Value Date   NA 142 07/19/2016   K 4.1 07/19/2016   CL 107 07/19/2016   CO2 31 07/19/2016   GLUCOSE 90 07/19/2016   BUN 11 07/19/2016   CREATININE 0.91 07/19/2016   CALCIUM 8.8 (L) 07/19/2016   PROT 6.2 (L) 07/19/2016   ALBUMIN 2.9 (L) 07/19/2016   AST 15 07/19/2016   ALT 9 (L) 07/19/2016   ALKPHOS 48 07/19/2016   BILITOT <0.1 (L) 07/19/2016   GFRNONAA >60 07/19/2016   GFRAA >60 07/19/2016    Lab Results  Component Value Date   WBC 4.8 07/19/2016   NEUTROABS 14.1 (H) 12/31/2015   HGB 10.0 (L) 07/19/2016   HCT 29.3 (L) 07/19/2016   MCV 89.9 07/19/2016   PLT 276 07/19/2016     STUDIES: Ct Chest W Contrast  Result Date: 10/29/2016 CLINICAL DATA:  61 year old male with history of right-sided lung cancer diagnosed 2 years ago. Followup study. EXAM: CT CHEST WITH CONTRAST TECHNIQUE: Multidetector CT imaging of the chest was performed during intravenous contrast  administration. CONTRAST:  24m ISOVUE-300 IOPAMIDOL (ISOVUE-300) INJECTION 61% COMPARISON:  Chest CT 07/18/2016. FINDINGS: Cardiovascular: Heart size is normal. There is no significant pericardial fluid, thickening or pericardial calcification. There is aortic atherosclerosis, as well as atherosclerosis of the great vessels of the mediastinum and the coronary arteries, including calcified atherosclerotic plaque in the left main and left circumflex coronary arteries. Left-sided internal jugular single-lumen porta cath with tip terminating in the distal superior vena cava. Mediastinum/Nodes: No pathologically enlarged mediastinal or hilar lymph nodes. Esophagus is unremarkable in appearance. No axillary lymphadenopathy. Lungs/Pleura: Previously treated right upper lobe pulmonary nodule is similar in size to the prior examination measuring 17 x 13 mm on today's study (image 55 of series 3). The adjacent satellite nodule is also similar in size to the prior study measuring 17 x 8 mm (image 56 of series 3), as is an adjacent partially calcified nodule measuring 16 x 7 mm (axial image 58  of series 3). Several other smaller pulmonary nodules are scattered throughout the lungs bilaterally, many of which are calcified, presumably calcified granulomas. No other new suspicious appearing pulmonary nodules or masses are noted. Diffuse bronchial wall thickening with moderate centrilobular and mild paraseptal emphysema. No acute consolidative airspace disease. No pleural effusions. Upper Abdomen: Aortic atherosclerosis. Small calcified granuloma in the spleen. Otherwise, unremarkable. Musculoskeletal: There are no aggressive appearing lytic or blastic lesions noted in the visualized portions of the skeleton. IMPRESSION: 1. Stable examination, as above, without evidence to suggest recurrent/progressive disease. 2. Old granulomatous disease, as above. 3. Aortic atherosclerosis, in addition to left main and left circumflex coronary  artery disease. Please note that although the presence of coronary artery calcium documents the presence of coronary artery disease, the severity of this disease and any potential stenosis cannot be assessed on this non-gated CT examination. Assessment for potential risk factor modification, dietary therapy or pharmacologic therapy may be warranted, if clinically indicated. 4. Mild diffuse bronchial wall thickening with moderate centrilobular and mild paraseptal emphysema; imaging findings suggestive of underlying COPD. Electronically Signed   By: Vinnie Langton M.D.   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  PLAN:    1. 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 No intervention is needed at this time. Return to clinic in 4 months with repeat imaging and further evaluation.  2. Weight loss: Improved. Continue 4 mg Decadron daily. Patient was instructed that he will need to discontinue taper off this medication shortly. 3. History of melanoma: Patient is a poor historian but appears to have been greater than 3 years ago. Monitor. 4. Pain: Patient will no longer receive narcotics from this clinic. Of note, patient also had several urinary drug screen positive for cocaine. Continue gabapentin as prescribed.    Patient expressed understanding and was in agreement with this plan. He also understands that He can call clinic at any time with any questions, concerns, or complaints.   Lloyd Huger, MD   11/03/2016 11:41 AM

## 2016-11-03 ENCOUNTER — Inpatient Hospital Stay: Payer: Medicaid Other | Attending: Oncology | Admitting: Oncology

## 2016-11-03 ENCOUNTER — Encounter: Payer: Self-pay | Admitting: Oncology

## 2016-11-03 VITALS — BP 114/71 | HR 78 | Temp 97.5°F | Resp 18 | Ht 69.0 in | Wt 143.7 lb

## 2016-11-03 DIAGNOSIS — Z87442 Personal history of urinary calculi: Secondary | ICD-10-CM | POA: Diagnosis not present

## 2016-11-03 DIAGNOSIS — C3411 Malignant neoplasm of upper lobe, right bronchus or lung: Secondary | ICD-10-CM | POA: Diagnosis not present

## 2016-11-03 DIAGNOSIS — Z85820 Personal history of malignant melanoma of skin: Secondary | ICD-10-CM | POA: Insufficient documentation

## 2016-11-03 DIAGNOSIS — J449 Chronic obstructive pulmonary disease, unspecified: Secondary | ICD-10-CM | POA: Diagnosis not present

## 2016-11-03 DIAGNOSIS — R634 Abnormal weight loss: Secondary | ICD-10-CM | POA: Insufficient documentation

## 2016-11-03 DIAGNOSIS — I10 Essential (primary) hypertension: Secondary | ICD-10-CM | POA: Diagnosis not present

## 2016-11-03 DIAGNOSIS — M109 Gout, unspecified: Secondary | ICD-10-CM | POA: Diagnosis not present

## 2016-11-03 DIAGNOSIS — F17210 Nicotine dependence, cigarettes, uncomplicated: Secondary | ICD-10-CM | POA: Diagnosis not present

## 2016-11-03 DIAGNOSIS — J8410 Pulmonary fibrosis, unspecified: Secondary | ICD-10-CM | POA: Diagnosis not present

## 2016-11-03 DIAGNOSIS — Z79899 Other long term (current) drug therapy: Secondary | ICD-10-CM | POA: Diagnosis not present

## 2016-11-03 DIAGNOSIS — I700 Atherosclerosis of aorta: Secondary | ICD-10-CM | POA: Insufficient documentation

## 2016-11-03 DIAGNOSIS — C3491 Malignant neoplasm of unspecified part of right bronchus or lung: Secondary | ICD-10-CM

## 2016-11-03 NOTE — Progress Notes (Signed)
Pt needs med for anixety. He wants refill of inhaler. And he wants neurontin 600 mg either once or twice a day( she states that when he gets 3 a day he gets loopy.Marland Kitchen

## 2016-12-08 ENCOUNTER — Other Ambulatory Visit: Payer: Self-pay | Admitting: *Deleted

## 2016-12-08 DIAGNOSIS — C3491 Malignant neoplasm of unspecified part of right bronchus or lung: Secondary | ICD-10-CM

## 2016-12-08 MED ORDER — DEXAMETHASONE 4 MG PO TABS: 4.0000 mg | ORAL_TABLET | Freq: Every day | ORAL | 0 refills | Status: DC

## 2017-01-10 ENCOUNTER — Other Ambulatory Visit: Payer: Self-pay | Admitting: *Deleted

## 2017-01-10 DIAGNOSIS — C3491 Malignant neoplasm of unspecified part of right bronchus or lung: Secondary | ICD-10-CM

## 2017-01-10 MED ORDER — GABAPENTIN 300 MG PO CAPS: 300.0000 mg | ORAL_CAPSULE | Freq: Three times a day (TID) | ORAL | 0 refills | Status: DC

## 2017-01-17 ENCOUNTER — Ambulatory Visit: Payer: Medicaid Other | Admitting: Family Medicine

## 2017-01-31 ENCOUNTER — Telehealth: Payer: Self-pay

## 2017-01-31 NOTE — Telephone Encounter (Signed)
Left message on daughter voicemail for them to contact his PCP.

## 2017-01-31 NOTE — Telephone Encounter (Signed)
Please call PCP.  

## 2017-01-31 NOTE — Telephone Encounter (Signed)
Patient's daughter called and said patient has some swelling in his knuckles. The swelling has started 3-4 days ago, he has little pain in his hand. Please advise.

## 2017-02-01 ENCOUNTER — Ambulatory Visit: Payer: Medicaid Other | Admitting: Family Medicine

## 2017-02-03 ENCOUNTER — Emergency Department: Payer: Medicaid Other

## 2017-02-03 ENCOUNTER — Encounter: Payer: Self-pay | Admitting: Emergency Medicine

## 2017-02-03 DIAGNOSIS — Z8739 Personal history of other diseases of the musculoskeletal system and connective tissue: Secondary | ICD-10-CM | POA: Insufficient documentation

## 2017-02-03 DIAGNOSIS — I10 Essential (primary) hypertension: Secondary | ICD-10-CM | POA: Insufficient documentation

## 2017-02-03 DIAGNOSIS — C3490 Malignant neoplasm of unspecified part of unspecified bronchus or lung: Secondary | ICD-10-CM | POA: Insufficient documentation

## 2017-02-03 DIAGNOSIS — J449 Chronic obstructive pulmonary disease, unspecified: Secondary | ICD-10-CM | POA: Insufficient documentation

## 2017-02-03 DIAGNOSIS — R0602 Shortness of breath: Secondary | ICD-10-CM | POA: Diagnosis present

## 2017-02-03 DIAGNOSIS — J45909 Unspecified asthma, uncomplicated: Secondary | ICD-10-CM | POA: Diagnosis not present

## 2017-02-03 DIAGNOSIS — M1009 Idiopathic gout, multiple sites: Secondary | ICD-10-CM | POA: Diagnosis not present

## 2017-02-03 DIAGNOSIS — F17210 Nicotine dependence, cigarettes, uncomplicated: Secondary | ICD-10-CM | POA: Diagnosis not present

## 2017-02-03 LAB — CBC
HCT: 35 % — ABNORMAL LOW (ref 40.0–52.0)
Hemoglobin: 12.4 g/dL — ABNORMAL LOW (ref 13.0–18.0)
MCH: 31.6 pg (ref 26.0–34.0)
MCHC: 35.5 g/dL (ref 32.0–36.0)
MCV: 89 fL (ref 80.0–100.0)
Platelets: 423 10*3/uL (ref 150–440)
RBC: 3.94 MIL/uL — ABNORMAL LOW (ref 4.40–5.90)
RDW: 14.1 % (ref 11.5–14.5)
WBC: 7.9 10*3/uL (ref 3.8–10.6)

## 2017-02-03 LAB — BASIC METABOLIC PANEL
Anion gap: 10 (ref 5–15)
BUN: 8 mg/dL (ref 6–20)
CO2: 25 mmol/L (ref 22–32)
CREATININE: 1.04 mg/dL (ref 0.61–1.24)
Calcium: 9.1 mg/dL (ref 8.9–10.3)
Chloride: 104 mmol/L (ref 101–111)
GFR calc Af Amer: >60 mL/min (ref >60)
GLUCOSE: 96 mg/dL (ref 65–99)
Potassium: 3.7 mmol/L (ref 3.5–5.1)
Sodium: 139 mmol/L (ref 135–145)

## 2017-02-03 LAB — TROPONIN I

## 2017-02-03 NOTE — ED Triage Notes (Addendum)
Pt ambulatory to triage in NAD, report increased SOB and hemoptysis x 1.5 weeks, reports increased gout pain with pain in arms and hands bilaterally.  Pt hx of lung cancer and COPD

## 2017-02-04 ENCOUNTER — Emergency Department
Admission: EM | Admit: 2017-02-04 | Discharge: 2017-02-04 | Disposition: A | Discharge status: Home / Self Care | Payer: Medicaid Other | Attending: Emergency Medicine | Admitting: Emergency Medicine

## 2017-02-04 ENCOUNTER — Other Ambulatory Visit: Payer: Self-pay | Admitting: Family Medicine

## 2017-02-04 ENCOUNTER — Encounter: Payer: Self-pay | Admitting: Radiology

## 2017-02-04 ENCOUNTER — Emergency Department: Payer: Medicaid Other

## 2017-02-04 DIAGNOSIS — J432 Centrilobular emphysema: Secondary | ICD-10-CM

## 2017-02-04 DIAGNOSIS — R0602 Shortness of breath: Secondary | ICD-10-CM

## 2017-02-04 DIAGNOSIS — M109 Gout, unspecified: Secondary | ICD-10-CM

## 2017-02-04 DIAGNOSIS — J449 Chronic obstructive pulmonary disease, unspecified: Secondary | ICD-10-CM

## 2017-02-04 MED ORDER — SODIUM CHLORIDE 0.9 % IV BOLUS (SEPSIS)
1000.0000 mL | Freq: Once | INTRAVENOUS | Status: AC
  Administered 2017-02-04: 1000 mL via INTRAVENOUS

## 2017-02-04 MED ORDER — ONDANSETRON HCL 4 MG/2ML IJ SOLN
4.0000 mg | Freq: Once | INTRAMUSCULAR | Status: AC
  Administered 2017-02-04: 4 mg via INTRAVENOUS
  Filled 2017-02-04: qty 2

## 2017-02-04 MED ORDER — IPRATROPIUM-ALBUTEROL 0.5-2.5 (3) MG/3ML IN SOLN
3.0000 mL | Freq: Once | RESPIRATORY_TRACT | Status: AC
  Administered 2017-02-04: 3 mL via RESPIRATORY_TRACT
  Filled 2017-02-04: qty 3

## 2017-02-04 MED ORDER — OXYCODONE HCL 5 MG PO CAPS: 5.0000 mg | ORAL_CAPSULE | Freq: Four times a day (QID) | ORAL | 0 refills | Status: DC | PRN

## 2017-02-04 MED ORDER — HYDROMORPHONE HCL 1 MG/ML IJ SOLN
0.5000 mg | Freq: Once | INTRAMUSCULAR | Status: AC
  Administered 2017-02-04: 0.5 mg via INTRAVENOUS
  Filled 2017-02-04: qty 1

## 2017-02-04 MED ORDER — IOPAMIDOL (ISOVUE-370) INJECTION 76%
75.0000 mL | Freq: Once | INTRAVENOUS | Status: AC | PRN
  Administered 2017-02-04: 75 mL via INTRAVENOUS

## 2017-02-04 NOTE — Discharge Instructions (Signed)
1. You may take oxycodone as needed for pain. 2. Return to the ER for worsening symptoms, persistent vomiting, difficulty breathing or other concerns.

## 2017-02-04 NOTE — ED Provider Notes (Signed)
[facility] Emergency Department Provider Note   ____________________________________________   First MD Initiated Contact with Patient 02/04/17 0105     (approximate)  I have reviewed the triage vital signs and the nursing notes.   HISTORY   Shortness of Breath; Cough; and Gout    HPI David [name] is a 61 y.o. male who presents to the ED from home cough with shortness of breath, increased gout pain and hemoptysis. Patient has a history of lung cancer status post chemotherapy over 8 months ago. Also with COPD on 2 L oxygen nightly. Complains of left lung pain and a single episode of hemoptysis 1-1/2 weeks ago. Mostly concerned with pain from gouty arthritis especially in his hands, arms and shoulders. Was seen at urgent care several days ago and prescribed tramadol which he says has not managed his pain.Denies fever, chills, abdominal pain, nausea, vomiting. Denies recent travel or trauma. Nothing makes his symptoms better or worse.   Past Medical History:  Diagnosis Date  . Arthritis   . Asthma   . COPD (chronic obstructive pulmonary disease) (HCC)    not on home o2  . Gout   . History of kidney stones   . History of melanoma   . Hypertension   . Oxygen deficiency    O2 1 LITER [facility]  . Pain    chronic pain all over  . Small cell lung cancer (HCC)    melanoma  . Tobacco use     Patient Active Problem List   Diagnosis Date Noted  . Chronic pain syndrome 09/28/2016  . Long term current use of opiate analgesic 09/28/2016  . Long term prescription opiate use 09/28/2016  . Disturbance of skin sensation 09/28/2016  . Lung mass   . Anemia 07/19/2016  . Multiple lung nodules 07/19/2016  . Chest pain 07/18/2016  . Primary osteoarthritis involving multiple joints 06/23/2016  . Chronic bursitis of left shoulder 06/23/2016  . Chronic pain of both shoulders 06/23/2016  . Polysubstance abuse 06/23/2016  . Tobacco abuse 04/20/2016  . History of  melanoma 04/20/2016  . COPD (chronic obstructive pulmonary disease) ([place]) 04/20/2016  . Gout 04/20/2016  . Adjustment disorder 04/20/2016  . Insomnia 04/20/2016  . Chronic pain due to neoplasm 04/20/2016  . Opiate use 04/20/2016  . Small cell lung cancer ([place]) 09/08/2015    Past Surgical History:  Procedure Laterality Date  . ELECTROMAGNETIC NAVIGATION BROCHOSCOPY N/A 07/22/2016   Procedure: ELECTROMAGNETIC NAVIGATION BRONCHOSCOPY;  Surgeon: Flora Lipps, MD;  Location: [facility] ORS;  Service: Cardiopulmonary;  Laterality: N/A;  . PERIPHERAL VASCULAR CATHETERIZATION N/A 09/15/2015   Procedure: Glori Luis Cath Insertion;  Surgeon: Algernon Huxley, MD;  Location: [place] [facility];  Service: Cardiovascular;  Laterality: N/A;  . SKIN GRAFT     [facility]    Prior to Admission medications   Medication Sig Start Date End Date Taking? Authorizing Provider  albuterol (PROVENTIL HFA;VENTOLIN HFA) 108 (90 Base) MCG/ACT inhaler Inhale 2 puffs into the lungs every 4 (four) hours as needed for wheezing or shortness of breath. 06/16/16   Flora Lipps, MD  feeding supplement, ENSURE ENLIVE, (ENSURE ENLIVE) LIQD Take 237 mLs by mouth 3 (three) times daily between meals. Patient not taking: Reported on 11/03/2016 07/19/16   Theodoro Grist, MD  gabapentin (NEURONTIN) 300 MG capsule Take 1 capsule (300 mg total) by mouth 3 (three) times daily. 01/10/17   Lloyd Huger, MD  ibuprofen (ADVIL,MOTRIN) 200 MG tablet Take 800 mg by mouth  every 8 (eight) hours.    [provider]  meloxicam (MOBIC) 15 MG tablet Take 1 tablet (15 mg total) by mouth daily as needed for pain. 11/01/16   Karamalegos, Devonne Doughty, DO  oxycodone (OXY-IR) 5 MG capsule Take 1 capsule (5 mg total) by mouth every 6 (six) hours as needed for pain. 02/04/17   Paulette Blanch, MD  pantoprazole (PROTONIX) 40 MG tablet Take 1 tablet (40 mg total) by mouth daily. 07/20/16   Theodoro Grist, MD  tiotropium (SPIRIVA) 18 MCG inhalation capsule Place 1 capsule  (18 mcg total) into inhaler and inhale daily. 06/16/16   Flora Lipps, MD    Allergies Tylenol [acetaminophen] and Vicodin [hydrocodone-acetaminophen]  Family History  Problem Relation Age of Onset  . Lung cancer Mother   . Heart attack Mother 25  . Cirrhosis Father 37    Social History Social History  Substance Use Topics  . Smoking status: Current Every Day Smoker    Packs/day: 1.00    Years: 51.00    Types: Cigarettes    Start date: 08/12/1994  . Smokeless tobacco: Never Used     Comment: used to smoke upto 2PPD, recently cut down  . Alcohol use No    Review of Systems  Constitutional: No fever/chills. Eyes: No visual changes. ENT: No sore throat. Cardiovascular: Positive for left lung/chest pain. Respiratory: Positive for hemoptysis, cough and shortness of breath. Gastrointestinal: No abdominal pain.  No nausea, no vomiting.  No diarrhea.  No constipation. Genitourinary: Negative for dysuria. Musculoskeletal: Positive for gouty arthritis pain of hands, arms and shoulders. Negative for back pain. Skin: Negative for rash. Neurological: Negative for headaches, focal weakness or numbness.  ____________________________________________   PHYSICAL EXAM:  VITAL SIGNS: ED Triage Vitals  Enc Vitals Group     BP 02/03/17 2137 119/69     Pulse Rate 02/03/17 2137 (!) 105     Resp 02/03/17 2137 16     Temp 02/03/17 2137 98.1 F (36.7 C)     Temp Source 02/03/17 2137 Oral     SpO2 02/03/17 2137 96 %     Weight 02/03/17 2138 146 lb (66.2 kg)     Height 02/03/17 2138 5\' 9"  (1.753 m)     Head Circumference --      Peak Flow --      Pain Score 02/03/17 2145 9     Pain Loc --      Pain Edu? --      Excl. in [place]? --     Constitutional: Alert and oriented. Well appearing and in mild acute distress. Eyes: Conjunctivae are normal. PERRL. EOMI. Head: Atraumatic. Nose: No congestion/rhinnorhea. Mouth/Throat: Mucous membranes are moist.  Oropharynx non-erythematous. Neck: No  stridor.   Cardiovascular: Normal rate, regular rhythm. Grossly normal heart sounds.  Good peripheral circulation. Respiratory: Normal respiratory effort.  No retractions. Lungs diminished bibasilarly.. Gastrointestinal: Soft and nontender. No distention. No abdominal bruits. No CVA tenderness. Musculoskeletal: Gouty tophi on dorsum of right hand. Pain to bilateral hands, elbows and shoulders. Full range of motion with some pain. No lower extremity tenderness nor edema.  No joint effusions. Neurologic:  Normal speech and language. No gross focal neurologic deficits are appreciated.  Skin:  Skin is warm, dry and intact. No rash noted. Psychiatric: Mood and affect are normal. Speech and behavior are normal.  ____________________________________________   LABS (all labs ordered are listed, but only abnormal results are displayed)  Labs Reviewed  CBC - Abnormal; Notable for the  following:       Result Value   RBC 3.94 (*)    Hemoglobin 12.4 (*)    HCT 35.0 (*)    All other components within normal limits  BASIC METABOLIC PANEL  TROPONIN I   ____________________________________________  EKG  ED ECG REPORT I, Jaala Bohle J, the attending physician, personally viewed and interpreted this ECG.   Date: 02/04/2017  EKG Time: 2145  Rate: 98  Rhythm: normal EKG, normal sinus rhythm  Axis: Normal  Intervals:none  ST&T Change: Nonspecific  ____________________________________________  RADIOLOGY  Dg Chest 2 View  Result Date: 02/03/2017 CLINICAL DATA:  Shortness of breath EXAM: CHEST  2 VIEW COMPARISON:  Chest CT 10/29/2016 FINDINGS: Tip of the left chest wall Port-A-Cath is in the lower SVC. Opacity in the right upper lobe corresponds to nodule identified on prior CT. The lungs are hyperinflated without focal consolidation. No pulmonary edema. No pleural effusion or pneumothorax. IMPRESSION: 1. No active cardiopulmonary disease. 2. Nodular opacity in the right upper lobe corresponds to  the nodule identified on prior chest CT. Electronically Signed   By: Ulyses Jarred M.D.   On: 02/03/2017 22:12   Ct Angio Chest Pe W/cm &/or Wo Cm  Result Date: 02/04/2017 CLINICAL DATA:  Increased shortness of breath and hemoptysis history of lung cancer EXAM: CT ANGIOGRAPHY CHEST WITH CONTRAST TECHNIQUE: Multidetector CT imaging of the chest was performed using the standard protocol during bolus administration of intravenous contrast. Multiplanar CT image reconstructions and MIPs were obtained to evaluate the vascular anatomy. CONTRAST:  75 mL Isovue 370 intravenous COMPARISON:  02/03/2017, CT 10/29/2016 FINDINGS: Cardiovascular: Satisfactory opacification of the pulmonary arteries to the segmental level. No evidence of pulmonary embolism. Non aneurysmal aorta. No dissection is seen. Aortic atherosclerosis. Coronary artery calcification. Normal heart size. Trace pericardial effusion or thickening. Mediastinum/Nodes: Midline trachea. No thyroid mass. Stable subcentimeter mediastinal lymph nodes. Right hilar lymph node measures 24 x 17 mm and is unchanged. Esophagus within normal limits. Stable subcarinal lymph node. Lungs/Pleura: No acute pulmonary infiltrate or effusion. No pneumothorax. Moderate severe emphysema. Stable spiculated density in the right upper lobe measuring 17 x 13 mm. Stable soft tissue thickening in the right hilar area. Again demonstrated are multiple bilateral pulmonary nodules without significant interval change. Calcified granuloma in the right middle lobe. Upper Abdomen: No acute abnormality Musculoskeletal: Degenerative changes. No acute or suspicious bone lesion. Review of the MIP images confirms the above findings. IMPRESSION: 1. Negative for acute pulmonary embolus or aortic dissection 2. Stable spiculated/treated nodule in the right upper lobe since the previous exam. Multiple additional pulmonary nodules bilaterally which do not appear significantly changed. 3. No increasing hilar  or mediastinal adenopathy 4. Moderate severe emphysema Aortic Atherosclerosis (ICD10-I70.0) and Emphysema (ICD10-J43.9). Electronically Signed   By: Donavan Foil M.D.   On: 02/04/2017 02:45    ____________________________________________   PROCEDURES  Procedure(s) performed: None  Procedures  Critical Care performed: No  ____________________________________________   INITIAL IMPRESSION / ASSESSMENT AND PLAN / ED COURSE  Pertinent labs & imaging results that were available during my care of the patient were reviewed by me and considered in my medical decision making (see chart for details).  61 year old male with lung cancer who presents with cough, increased shortness of breath, single episode of hemoptysis but mainly concerned with gouty arthritis pain. Laboratory results and chest x-ray unremarkable. Will administer DuoNeb, analgesia and proceed with CTA chest to evaluate for PE.  Clinical Course as of Feb 04 333  Fri Feb 04, 2017  0326 Aeration improved after nebulizer treatment. Patient resting comfortably in no acute distress. Rates improvement of pain. Updated patient and spouse of CT imaging results. Strict return precautions given. Both verbalize understanding and agree with plan of care.  [JS]    Clinical Course User Index [JS] Paulette Blanch, MD     ____________________________________________   FINAL CLINICAL IMPRESSION(S) / ED DIAGNOSES  Final diagnoses:  Gouty arthritis  Shortness of breath  Chronic obstructive pulmonary disease, unspecified COPD type ([place])      NEW MEDICATIONS STARTED DURING THIS VISIT:  New Prescriptions   OXYCODONE (OXY-IR) 5 MG CAPSULE    Take 1 capsule (5 mg total) by mouth every 6 (six) hours as needed for pain.     Note:  This document was prepared using Dragon voice recognition software and may include unintentional dictation errors.    Paulette Blanch, MD 02/04/17 [phone]

## 2017-02-04 NOTE — ED Notes (Signed)
02 sat went from 94 to 93%. Placed pt on 2L nasal cannula

## 2017-02-04 NOTE — ED Notes (Signed)
Pt states that he is coughing up some blood, chest pain, lung hurts on left side, and has gout in his hands and arms. Went to urgent care and was placed on Tramadol but did not work.

## 2017-02-28 ENCOUNTER — Ambulatory Visit: Admission: RE | Admit: 2017-02-28 | Payer: Medicaid Other | Source: Ambulatory Visit

## 2017-03-02 ENCOUNTER — Other Ambulatory Visit: Payer: Self-pay | Admitting: *Deleted

## 2017-03-02 ENCOUNTER — Other Ambulatory Visit: Payer: Self-pay | Admitting: Internal Medicine

## 2017-03-02 DIAGNOSIS — C3491 Malignant neoplasm of unspecified part of right bronchus or lung: Secondary | ICD-10-CM

## 2017-03-02 MED ORDER — TIOTROPIUM BROMIDE MONOHYDRATE 18 MCG IN CAPS: 18.0000 ug | ORAL_CAPSULE | Freq: Every day | RESPIRATORY_TRACT | 0 refills | Status: DC

## 2017-03-02 NOTE — Progress Notes (Signed)
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  INTERVAL HISTORY: Patient returns to clinic today for routine evaluation and discussion of his imaging results. He is having significant hand and joint pain, but otherwise feels well. He reports a poor appetite appetite. He does not complain of headache. He has no neurologic complaints. He denies any fevers. He denies any chest pain, shortness of breath, cough, or hemoptysis. He denies any nausea, vomiting, constipation, or diarrhea. He has no urinary complaints. Patient offers no further specific complaints.  REVIEW OF SYSTEMS:   Review of Systems  Constitutional: Positive for weight loss. Negative for fever and malaise/fatigue.  HENT: Negative for tinnitus.   Respiratory: Negative for cough, hemoptysis and shortness of breath.   Cardiovascular: Negative.  Negative for chest pain and leg swelling.  Gastrointestinal: Negative.  Negative for abdominal pain.  Genitourinary: Negative.   Musculoskeletal: Positive for joint pain and myalgias.  Neurological: Negative.  Negative for weakness and headaches.  Psychiatric/Behavioral: Negative.  The patient is not nervous/anxious.     As per HPI. Otherwise, a complete review of systems is negative.  PAST MEDICAL HISTORY: Past Medical History:  Diagnosis Date  . Arthritis   . Asthma   . COPD (chronic obstructive pulmonary disease) (HCC)    not on home o2  . Gout   . History of kidney stones   . History of melanoma   . Hypertension   . Oxygen deficiency    O2 1 LITER  [facility]  . Pain    chronic pain all over  . Small cell lung cancer (HCC)    melanoma  . Tobacco use     PAST SURGICAL HISTORY: Past Surgical History:  Procedure Laterality  Date  . ELECTROMAGNETIC NAVIGATION BROCHOSCOPY N/A 07/22/2016   Procedure: ELECTROMAGNETIC NAVIGATION BRONCHOSCOPY;  Surgeon: Flora Lipps, MD;  Location: [facility];  Service: Cardiopulmonary;  Laterality: N/A;  . PERIPHERAL VASCULAR CATHETERIZATION N/A 09/15/2015   Procedure: Glori Luis Cath Insertion;  Surgeon: Algernon Huxley, MD;  Location: [facility];  Service: Cardiovascular;  Laterality: N/A;  . SKIN GRAFT     [facility]    FAMILY HISTORY: Reviewed and unchanged. No reported history of malignancy or chronic disease.     ADVANCED DIRECTIVES:    HEALTH MAINTENANCE: Social History  Substance Use Topics  . Smoking status: Current Every Day Smoker    Packs/day: 1.00    Years: 51.00    Types: Cigarettes    Start date: 08/12/1994  . Smokeless tobacco: Never Used     Comment: used to smoke upto 2PPD, recently cut down  . Alcohol use No     Colonoscopy:  PAP:  Bone density:  Lipid panel:  Allergies  Allergen Reactions  . Tylenol [Acetaminophen] Other (See Comments)    Unable to take due to chemotherapy regimen  . Vicodin [Hydrocodone-Acetaminophen] Nausea And Vomiting and Rash    Current Outpatient Prescriptions  Medication Sig Dispense Refill  . feeding supplement, ENSURE ENLIVE, (ENSURE ENLIVE) LIQD Take 237 mLs by mouth 3 (three) times daily between meals. 237 mL 12  . gabapentin (NEURONTIN) 300 MG capsule Take 1 capsule (300 mg total) by mouth 3 (three) times daily. 90 capsule 0  . meloxicam (MOBIC) 15 MG tablet Take 1 tablet (15 mg  total) by mouth 2 (two) times daily. 60 tablet 0  . pantoprazole (PROTONIX) 40 MG tablet Take 1 tablet (40 mg total) by mouth daily. 30 tablet 0  . PROAIR HFA 108 (90 Base) MCG/ACT inhaler INHALE 2 PUFFS INTO THE LUNGS EVERY 6 (SIX) HOURS AS NEEDED FOR WHEEZING OR SHORTNESS OF BREATH. 8.5 Inhaler 2  . tiotropium (SPIRIVA) 18 MCG inhalation capsule Place 1 capsule (18 mcg total) into inhaler and inhale daily. 30 capsule 0  . dexamethasone (DECADRON) 4  MG tablet Take 1 tablet (4 mg total) by mouth daily. 30 tablet 0  . ibuprofen (ADVIL,MOTRIN) 200 MG tablet Take 800 mg by mouth every 8 (eight) hours.    Marland Kitchen oxycodone (OXY-IR) 5 MG capsule Take 1 capsule (5 mg total) by mouth every 6 (six) hours as needed for pain. (Patient not taking: Reported on 03/03/2017) 15 capsule 0   No current facility-administered medications for this visit.     OBJECTIVE: Vitals:   03/03/17 1052  BP: 116/76  Pulse: (!) 102  Resp: 20  Temp: (!) 96.7 F (35.9 C)     Body mass index is 20.13 kg/m.    ECOG FS:0 - Asymptomatic  General: Thin, no acute distress. Eyes: Pink conjunctiva, anicteric sclera. Lungs: Clear to auscultation bilaterally. Heart: Regular rate and rhythm. No rubs, murmurs, or gallops. Abdomen: Soft, nontender, nondistended. No organomegaly noted, normoactive bowel sounds. Musculoskeletal: No edema, cyanosis, or clubbing. Neuro: Alert, answering all questions appropriately. Cranial nerves grossly intact. Skin: No rashes or petechiae noted. Psych: Normal affect.  LAB RESULTS:  Lab Results  Component Value Date   NA 139 02/03/2017   K 3.7 02/03/2017   CL 104 02/03/2017   CO2 25 02/03/2017   GLUCOSE 96 02/03/2017   BUN 8 02/03/2017   CREATININE 1.04 02/03/2017   CALCIUM 9.1 02/03/2017   PROT 6.2 (L) 07/19/2016   ALBUMIN 2.9 (L) 07/19/2016   AST 15 07/19/2016   ALT 9 (L) 07/19/2016   ALKPHOS 48 07/19/2016   BILITOT <0.1 (L) 07/19/2016   GFRNONAA >60 02/03/2017   GFRAA >60 02/03/2017    Lab Results  Component Value Date   WBC 7.9 02/03/2017   NEUTROABS 14.1 (H) 12/31/2015   HGB 12.4 (L) 02/03/2017   HCT 35.0 (L) 02/03/2017   MCV 89.0 02/03/2017   PLT 423 02/03/2017     STUDIES: Dg Chest 2 View  Result Date: 02/03/2017 CLINICAL DATA:  Shortness of breath EXAM: CHEST  2 VIEW COMPARISON:  Chest CT 10/29/2016 FINDINGS: Tip of the left chest wall Port-A-Cath is in the lower SVC. Opacity in the right upper lobe corresponds to  nodule identified on prior CT. The lungs are hyperinflated without focal consolidation. No pulmonary edema. No pleural effusion or pneumothorax. IMPRESSION: 1. No active cardiopulmonary disease. 2. Nodular opacity in the right upper lobe corresponds to the nodule identified on prior chest CT. Electronically Signed   By: Ulyses Jarred M.D.   On: 02/03/2017 22:12   Ct Angio Chest Pe W/cm &/or Wo Cm  Result Date: 02/04/2017 CLINICAL DATA:  Increased shortness of breath and hemoptysis history of lung cancer EXAM: CT ANGIOGRAPHY CHEST WITH CONTRAST TECHNIQUE: Multidetector CT imaging of the chest was performed using the standard protocol during bolus administration of intravenous contrast. Multiplanar CT image reconstructions and MIPs were obtained to evaluate the vascular anatomy. CONTRAST:  75 mL Isovue 370 intravenous COMPARISON:  02/03/2017, CT 10/29/2016 FINDINGS: Cardiovascular: Satisfactory opacification of the pulmonary arteries to the segmental level. No evidence  of pulmonary embolism. Non aneurysmal aorta. No dissection is seen. Aortic atherosclerosis. Coronary artery calcification. Normal heart size. Trace pericardial effusion or thickening. Mediastinum/Nodes: Midline trachea. No thyroid mass. Stable subcentimeter mediastinal lymph nodes. Right hilar lymph node measures 24 x 17 mm and is unchanged. Esophagus within normal limits. Stable subcarinal lymph node. Lungs/Pleura: No acute pulmonary infiltrate or effusion. No pneumothorax. Moderate severe emphysema. Stable spiculated density in the right upper lobe measuring 17 x 13 mm. Stable soft tissue thickening in the right hilar area. Again demonstrated are multiple bilateral pulmonary nodules without significant interval change. Calcified granuloma in the right middle lobe. Upper Abdomen: No acute abnormality Musculoskeletal: Degenerative changes. No acute or suspicious bone lesion. Review of the MIP images confirms the above findings. IMPRESSION: 1.  Negative for acute pulmonary embolus or aortic dissection 2. Stable spiculated/treated nodule in the right upper lobe since the previous exam. Multiple additional pulmonary nodules bilaterally which do not appear significantly changed. 3. No increasing hilar or mediastinal adenopathy 4. Moderate severe emphysema Aortic Atherosclerosis (ICD10-I70.0) and Emphysema (ICD10-J43.9). Electronically Signed   By: Donavan Foil M.D.   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  PLAN:    1. 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 No intervention is needed at this time. Return to clinic in 6 months with repeat imaging and further evaluation.  2. Weight loss: Improved. Patient was given a one-month supply of 4 mg Decadron daily. Patient was instructed that he will need to discontinue taper off this medication shortly. 3. History of melanoma: Patient is a poor historian but appears to have been greater than 3 years ago. Monitor. 4. Joint pain: ANA and rheumatoid factor were ordered, but patient did not stop and lab prior to leaving. Dexamethasone as above. Patient was also given a one-month supply of meloxicam until he can be evaluated by rheumatology. Patient will no longer receive narcotics from this clinic. Of note, patient also had several urinary drug screen positive for cocaine. Continue gabapentin as prescribed.   Patient expressed understanding and was in agreement with this plan. He also understands that He can call clinic at any time with any questions, concerns, or complaints.   Lloyd Huger, MD   03/04/2017 5:34 PM

## 2017-03-03 ENCOUNTER — Inpatient Hospital Stay: Payer: Medicaid Other

## 2017-03-03 ENCOUNTER — Inpatient Hospital Stay: Payer: Medicaid Other | Attending: Oncology | Admitting: Oncology

## 2017-03-03 VITALS — BP 116/76 | HR 102 | Temp 96.7°F | Resp 20 | Wt 136.3 lb

## 2017-03-03 DIAGNOSIS — M109 Gout, unspecified: Secondary | ICD-10-CM | POA: Diagnosis not present

## 2017-03-03 DIAGNOSIS — I700 Atherosclerosis of aorta: Secondary | ICD-10-CM | POA: Insufficient documentation

## 2017-03-03 DIAGNOSIS — G8929 Other chronic pain: Secondary | ICD-10-CM | POA: Diagnosis not present

## 2017-03-03 DIAGNOSIS — Z85820 Personal history of malignant melanoma of skin: Secondary | ICD-10-CM

## 2017-03-03 DIAGNOSIS — I10 Essential (primary) hypertension: Secondary | ICD-10-CM | POA: Insufficient documentation

## 2017-03-03 DIAGNOSIS — F17210 Nicotine dependence, cigarettes, uncomplicated: Secondary | ICD-10-CM | POA: Diagnosis not present

## 2017-03-03 DIAGNOSIS — J449 Chronic obstructive pulmonary disease, unspecified: Secondary | ICD-10-CM | POA: Diagnosis not present

## 2017-03-03 DIAGNOSIS — M2550 Pain in unspecified joint: Secondary | ICD-10-CM

## 2017-03-03 DIAGNOSIS — C3490 Malignant neoplasm of unspecified part of unspecified bronchus or lung: Secondary | ICD-10-CM

## 2017-03-03 DIAGNOSIS — Z79899 Other long term (current) drug therapy: Secondary | ICD-10-CM | POA: Diagnosis not present

## 2017-03-03 DIAGNOSIS — M150 Primary generalized (osteo)arthritis: Secondary | ICD-10-CM

## 2017-03-03 DIAGNOSIS — M25511 Pain in right shoulder: Secondary | ICD-10-CM

## 2017-03-03 DIAGNOSIS — M159 Polyosteoarthritis, unspecified: Secondary | ICD-10-CM

## 2017-03-03 DIAGNOSIS — C3411 Malignant neoplasm of upper lobe, right bronchus or lung: Secondary | ICD-10-CM

## 2017-03-03 DIAGNOSIS — Z87442 Personal history of urinary calculi: Secondary | ICD-10-CM | POA: Diagnosis not present

## 2017-03-03 DIAGNOSIS — M25512 Pain in left shoulder: Secondary | ICD-10-CM

## 2017-03-03 DIAGNOSIS — R634 Abnormal weight loss: Secondary | ICD-10-CM | POA: Diagnosis not present

## 2017-03-03 MED ORDER — PANTOPRAZOLE SODIUM 40 MG PO TBEC: 40.0000 mg | DELAYED_RELEASE_TABLET | Freq: Every day | ORAL | 0 refills | Status: DC

## 2017-03-03 MED ORDER — MELOXICAM 15 MG PO TABS: 15.0000 mg | ORAL_TABLET | Freq: Two times a day (BID) | ORAL | 0 refills | Status: DC

## 2017-03-03 MED ORDER — DEXAMETHASONE 4 MG PO TABS: 4.0000 mg | ORAL_TABLET | Freq: Every day | ORAL | 0 refills | Status: DC

## 2017-03-03 NOTE — Progress Notes (Signed)
Patient reports pain today related to gout, states he was seen in urgent care. Patient is also requesting refill on dexamethasone and questioning increasing dose of meloxican to twice daily. Patient also reports gabapentin is no longer working for him.

## 2017-03-09 ENCOUNTER — Telehealth: Payer: Self-pay | Admitting: *Deleted

## 2017-03-09 DIAGNOSIS — C3491 Malignant neoplasm of unspecified part of right bronchus or lung: Secondary | ICD-10-CM

## 2017-03-09 MED ORDER — GABAPENTIN 300 MG PO CAPS: 300.0000 mg | ORAL_CAPSULE | Freq: Three times a day (TID) | ORAL | 0 refills | Status: DC

## 2017-03-09 NOTE — Telephone Encounter (Signed)
Patient has completed all appointments medication refill completed.

## 2017-03-16 ENCOUNTER — Ambulatory Visit: Payer: Medicaid Other | Attending: Radiation Oncology | Admitting: Radiation Oncology

## 2017-04-26 ENCOUNTER — Encounter (INDEPENDENT_AMBULATORY_CARE_PROVIDER_SITE_OTHER): Payer: Self-pay | Admitting: Vascular Surgery

## 2017-04-26 ENCOUNTER — Ambulatory Visit (INDEPENDENT_AMBULATORY_CARE_PROVIDER_SITE_OTHER): Payer: Medicaid Other | Admitting: Vascular Surgery

## 2017-04-26 ENCOUNTER — Encounter (INDEPENDENT_AMBULATORY_CARE_PROVIDER_SITE_OTHER): Payer: Self-pay

## 2017-04-26 VITALS — BP 127/79 | HR 93 | Resp 16 | Wt 132.6 lb

## 2017-04-26 DIAGNOSIS — I10 Essential (primary) hypertension: Secondary | ICD-10-CM | POA: Insufficient documentation

## 2017-04-26 DIAGNOSIS — C3490 Malignant neoplasm of unspecified part of unspecified bronchus or lung: Secondary | ICD-10-CM | POA: Diagnosis not present

## 2017-04-26 NOTE — Progress Notes (Signed)
MRN : [id number]  David [name] is a 61 y.o. (05/14/1956) male who presents with chief complaint of  Chief Complaint  Patient presents with  . Follow-up    Discuss port removal  .  History of Present Illness: Patient returns today in follow up of port placement.  He had this placed about a year and a half ago For chemotherapy for his lung cancer. He finished this a few months ago and does not want to have the port any longer. The port is locally bothersome and irritating to him. He denies any fever or chills. No erythema or drainage from the site. No other complaints today.  Current Outpatient Prescriptions  Medication Sig Dispense Refill  . dexamethasone (DECADRON) 4 MG tablet Take 1 tablet (4 mg total) by mouth daily. 30 tablet 0  . feeding supplement, ENSURE ENLIVE, (ENSURE ENLIVE) LIQD Take 237 mLs by mouth 3 (three) times daily between meals. 237 mL 12  . gabapentin (NEURONTIN) 300 MG capsule Take 1 capsule (300 mg total) by mouth 3 (three) times daily. 90 capsule 0  . ibuprofen (ADVIL,MOTRIN) 200 MG tablet Take 800 mg by mouth every 8 (eight) hours.    . meloxicam (MOBIC) 15 MG tablet Take 1 tablet (15 mg total) by mouth 2 (two) times daily. 60 tablet 0  . pantoprazole (PROTONIX) 40 MG tablet Take 1 tablet (40 mg total) by mouth daily. 30 tablet 0  . PROAIR HFA 108 (90 Base) MCG/ACT inhaler INHALE 2 PUFFS INTO THE LUNGS EVERY 6 (SIX) HOURS AS NEEDED FOR WHEEZING OR SHORTNESS OF BREATH. 8.5 Inhaler 2  . tiotropium (SPIRIVA) 18 MCG inhalation capsule Place 1 capsule (18 mcg total) into inhaler and inhale daily. 30 capsule 0  . oxycodone (OXY-IR) 5 MG capsule Take 1 capsule (5 mg total) by mouth every 6 (six) hours as needed for pain. (Patient not taking: Reported on 03/03/2017) 15 capsule 0   No current facility-administered medications for this visit.     Past Medical History:  Diagnosis Date  . Arthritis   . Asthma   . COPD (chronic obstructive pulmonary disease) (HCC)    not on home o2  . Gout   . History of kidney stones   . History of melanoma   . Hypertension   . Oxygen deficiency    O2 1 LITER [place] [facility]  . Pain    chronic pain all over  . Small cell lung cancer (HCC)    melanoma  . Tobacco use     Past Surgical History:  Procedure Laterality Date  . ELECTROMAGNETIC NAVIGATION BROCHOSCOPY N/A 07/22/2016   Procedure: ELECTROMAGNETIC NAVIGATION BRONCHOSCOPY;  Surgeon: Flora Lipps, MD;  Location: [facility];  Service: Cardiopulmonary;  Laterality: N/A;  . PERIPHERAL VASCULAR CATHETERIZATION N/A 09/15/2015   Procedure: Glori Luis Cath Insertion;  Surgeon: Algernon Huxley, MD;  Location: [place];  Service: Cardiovascular;  Laterality: N/A;  . SKIN GRAFT     [facility]    Social History Social History  Substance Use Topics  . Smoking status: Current Every Day Smoker    Packs/day: 1.00    Years: 51.00    Types: Cigarettes    Start date: 08/12/1994  . Smokeless tobacco: Never Used     Comment: used to smoke upto 2PPD, recently cut down  . Alcohol use No    Family History Family History  Problem Relation Age of Onset  . Lung cancer Mother   . Heart attack Mother 38  .  Cirrhosis Father 12     Allergies  Allergen Reactions  . Tylenol [Acetaminophen] Other (See Comments)    Unable to take due to chemotherapy regimen  . Vicodin [Hydrocodone-Acetaminophen] Nausea And Vomiting and Rash     REVIEW OF SYSTEMS (Negative unless checked)  Constitutional: _0 Weight loss  _1 Fever  _2 Chills Cardiac: _3 Chest pain   _4 Chest pressure   _5 Palpitations   _6 Shortness of breath when laying flat   _7 Shortness of breath at rest   _8 Shortness of breath with exertion. Vascular:  _9 Pain in legs with walking   _10 Pain in legs at rest   _11 Pain in legs when laying flat   _12 Claudication   _13 Pain in feet when walking  _14 Pain in feet at rest  _15 Pain in feet when laying flat   _16 History of DVT   _17 Phlebitis   _18 Swelling in legs   _19 Varicose veins   _20 Non-healing  ulcers Pulmonary:   _21 Uses home oxygen   _22 Productive cough   _23 Hemoptysis   _24 Wheeze  _25 COPD   _26 Asthma Neurologic:  _27 Dizziness  _28 Blackouts   _29 Seizures   _30 History of stroke   _31 History of TIA  _32 Aphasia   _33 Temporary blindness   _34 Dysphagia   _35 Weakness or numbness in arms   _36 Weakness or numbness in legs Musculoskeletal:  _37 Arthritis   _38 Joint swelling   _39 Joint pain   _40 Low back pain Hematologic:  _41 Easy bruising  _42 Easy bleeding   _43 Hypercoagulable state   _44 Anemic   Gastrointestinal:  _45 Blood in stool   _46 Vomiting blood  _47 Gastroesophageal reflux/heartburn   _48 Abdominal pain Genitourinary:  _49 Chronic kidney disease   _50 Difficult urination  _51 Frequent urination  _52 Burning with urination   _53 Hematuria Skin:  _54 Rashes   _55 Ulcers   _56 Wounds Psychological:  _57 History of anxiety   _58  History of major depression.  Physical Examination  BP 127/79   Pulse 93   Resp 16   Wt 132 lb 9.6 oz (60.1 kg)   BMI 19.58 kg/m  Gen:  WD/WN, NAD, thin, appears older than stated age. Head: [place]/AT, No temporalis wasting. Ear/Nose/Throat: Hearing grossly intact, nares w/o erythema or drainage, trachea midline Eyes: Conjunctiva clear. Sclera non-icteric Neck: Supple.  No JVD.  Pulmonary:  Good air movement, no use of accessory muscles.  Cardiac: RRR, normal S1, S2 Vascular: port located in left subclavicular location Vessel Right Left  Radial Palpable Palpable                                   Musculoskeletal: M/S 5/5 throughout.  No deformity or atrophy.  Neurologic: Sensation grossly intact in extremities.  Symmetrical.  Speech is fluent.  Psychiatric: Judgment intact, Mood & affect appropriate for pt's clinical situation. Dermatologic: No rashes or ulcers noted.  No cellulitis or open wounds.      Labs Recent Results (from the past 2160 hour(s))  Basic metabolic panel     Status: None   Collection Time: 02/03/17  9:44 PM  Result Value Ref Range   Sodium 139 135 - 145 mmol/L    Potassium 3.7 3.5 - 5.1 mmol/L   Chloride 104 101 - 111 mmol/L   CO2 25 22 - 32 mmol/L   Glucose, Bld 96 65 - 99 mg/dL   BUN 8 6 - 20 mg/dL   Creatinine, Ser 1.04 0.61 - 1.24 mg/dL   Calcium 9.1 8.9 - 10.3 mg/dL   GFR calc non Af Amer >60 >60 mL/min   GFR calc Af Amer >60 >  60 mL/min    Comment: (NOTE) The eGFR has been calculated using the CKD EPI equation. This calculation has not been validated in all clinical situations. eGFR's persistently <60 mL/min signify possible Chronic Kidney Disease.    Anion gap 10 5 - 15  CBC     Status: Abnormal   Collection Time: 02/03/17  9:44 PM  Result Value Ref Range   WBC 7.9 3.8 - 10.6 K/uL   RBC 3.94 (L) 4.40 - 5.90 MIL/uL   Hemoglobin 12.4 (L) 13.0 - 18.0 g/dL   HCT 35.0 (L) 40.0 - 52.0 %   MCV 89.0 80.0 - 100.0 fL   MCH 31.6 26.0 - 34.0 pg   MCHC 35.5 32.0 - 36.0 g/dL   RDW 14.1 11.5 - 14.5 %   Platelets 423 150 - 440 K/uL  Troponin I     Status: None   Collection Time: 02/03/17  9:44 PM  Result Value Ref Range   Troponin I <0.03 <0.03 ng/mL    Radiology No results found.    Assessment/Plan  Small cell lung cancer ([place]) Completed therapy. Desires to have port removed. This can be removed at his convenience in the near future.  Hypertension blood pressure control important in reducing the progression of atherosclerotic disease. On appropriate oral medications.     Leotis Pain, MD  04/26/2017 1:28 PM    This note was created with Dragon medical transcription system.  Any errors from dictation are purely unintentional

## 2017-04-26 NOTE — Assessment & Plan Note (Signed)
Completed therapy. Desires to have port removed. This can be removed at his convenience in the near future.

## 2017-04-26 NOTE — Assessment & Plan Note (Signed)
blood pressure control important in reducing the progression of atherosclerotic disease. On appropriate oral medications.  

## 2017-04-27 ENCOUNTER — Other Ambulatory Visit (INDEPENDENT_AMBULATORY_CARE_PROVIDER_SITE_OTHER): Payer: Self-pay | Admitting: Vascular Surgery

## 2017-04-30 ENCOUNTER — Other Ambulatory Visit: Payer: Self-pay | Admitting: Oncology

## 2017-04-30 DIAGNOSIS — M150 Primary generalized (osteo)arthritis: Secondary | ICD-10-CM

## 2017-04-30 DIAGNOSIS — M159 Polyosteoarthritis, unspecified: Secondary | ICD-10-CM

## 2017-04-30 DIAGNOSIS — M25511 Pain in right shoulder: Principal | ICD-10-CM

## 2017-04-30 DIAGNOSIS — G8929 Other chronic pain: Secondary | ICD-10-CM

## 2017-04-30 DIAGNOSIS — M25512 Pain in left shoulder: Principal | ICD-10-CM

## 2017-05-02 ENCOUNTER — Other Ambulatory Visit: Payer: Self-pay | Admitting: Family Medicine

## 2017-05-02 ENCOUNTER — Other Ambulatory Visit: Payer: Self-pay | Admitting: Oncology

## 2017-05-02 DIAGNOSIS — J432 Centrilobular emphysema: Secondary | ICD-10-CM

## 2017-05-02 DIAGNOSIS — K219 Gastro-esophageal reflux disease without esophagitis: Secondary | ICD-10-CM

## 2017-05-02 DIAGNOSIS — M25512 Pain in left shoulder: Secondary | ICD-10-CM

## 2017-05-02 DIAGNOSIS — M25511 Pain in right shoulder: Secondary | ICD-10-CM

## 2017-05-02 DIAGNOSIS — G8929 Other chronic pain: Secondary | ICD-10-CM

## 2017-05-02 DIAGNOSIS — M150 Primary generalized (osteo)arthritis: Secondary | ICD-10-CM

## 2017-05-02 DIAGNOSIS — M159 Polyosteoarthritis, unspecified: Secondary | ICD-10-CM

## 2017-05-02 MED ORDER — TIOTROPIUM BROMIDE MONOHYDRATE 18 MCG IN CAPS: 18.0000 ug | ORAL_CAPSULE | Freq: Every day | RESPIRATORY_TRACT | 5 refills | Status: DC

## 2017-05-02 MED ORDER — ALBUTEROL SULFATE HFA 108 (90 BASE) MCG/ACT IN AERS: 2.0000 | INHALATION_SPRAY | Freq: Four times a day (QID) | RESPIRATORY_TRACT | 2 refills | Status: DC | PRN

## 2017-05-02 MED ORDER — PANTOPRAZOLE SODIUM 40 MG PO TBEC: 40.0000 mg | DELAYED_RELEASE_TABLET | Freq: Every day | ORAL | 1 refills | Status: DC

## 2017-05-02 MED ORDER — MELOXICAM 15 MG PO TABS: 15.0000 mg | ORAL_TABLET | Freq: Every day | ORAL | 1 refills | Status: DC

## 2017-05-02 NOTE — Telephone Encounter (Signed)
Refilled requested medication with Meloxicam, Pantroprazole, Spiriva, and Albuterol. 90 day supply on meloxicam and protonix, other inhalers 30 day supply with refills.  Attached copied staff message received today, see below:  "Good morning Dr. Jerene Pitch,  My name is Jana Hakim. I am a Consulting civil engineer with [employer] and am now working with [name] in care management services. Upon speaking with [name] and his daughter, [name], he identified a need for medication assistance. [name] advised receiving a check every month however, after paying his bills, he has very little money left. As such, he has difficulty obtaining his medications and today informed me of being out of all meds; Meloxicam 6m, Pantoprazole 439m Spiriva, Proair, Gabapentin 3009mI [facility] [place] [facility] and was informed by the pharmacist that he does not have any med refills except for Gabapentin. The pharmacist also suggested [name] his PCP to notify that refills are needed. I followed up with [name] his daughter about my call with [facility] and offered to notify you of his medication issue. Also, I encouraged him to call and schedule an appt with you as well. One solution to his finance that was discussed was requesting a 90 days supply for his medications. I am aware that inhalers and any narcotics are exempt from the 90 day supplies and [name] educated on this too. I am asking on his behalf if you will call in his needed medications and any that are covered for 90 days to help with his financial strain. I will continue to work with [name] care management services until his needs are met and will provide updates as needed. Thank you for your time and consideration.   MonJana HakimN BSN CCM  RN Care Manager  [facility]  [phone]

## 2017-05-04 MED ORDER — CEFAZOLIN SODIUM-DEXTROSE 2-4 GM/100ML-% IV SOLN
2.0000 g | Freq: Once | INTRAVENOUS | Status: AC
  Administered 2017-05-05: 2 g via INTRAVENOUS

## 2017-05-05 ENCOUNTER — Encounter
Admission: RE | Disposition: A | Discharge status: Home / Self Care | Payer: Self-pay | Source: Ambulatory Visit | Attending: Vascular Surgery

## 2017-05-05 ENCOUNTER — Ambulatory Visit
Admission: RE | Admit: 2017-05-05 | Discharge: 2017-05-05 | Disposition: A | Discharge status: Home / Self Care | Payer: Medicaid Other | Source: Ambulatory Visit | Attending: Vascular Surgery | Admitting: Vascular Surgery

## 2017-05-05 DIAGNOSIS — F17210 Nicotine dependence, cigarettes, uncomplicated: Secondary | ICD-10-CM | POA: Insufficient documentation

## 2017-05-05 DIAGNOSIS — J45909 Unspecified asthma, uncomplicated: Secondary | ICD-10-CM | POA: Insufficient documentation

## 2017-05-05 DIAGNOSIS — Z801 Family history of malignant neoplasm of trachea, bronchus and lung: Secondary | ICD-10-CM | POA: Diagnosis not present

## 2017-05-05 DIAGNOSIS — Z886 Allergy status to analgesic agent status: Secondary | ICD-10-CM | POA: Diagnosis not present

## 2017-05-05 DIAGNOSIS — Z87442 Personal history of urinary calculi: Secondary | ICD-10-CM | POA: Insufficient documentation

## 2017-05-05 DIAGNOSIS — M1990 Unspecified osteoarthritis, unspecified site: Secondary | ICD-10-CM | POA: Insufficient documentation

## 2017-05-05 DIAGNOSIS — Z85820 Personal history of malignant melanoma of skin: Secondary | ICD-10-CM | POA: Insufficient documentation

## 2017-05-05 DIAGNOSIS — Z9889 Other specified postprocedural states: Secondary | ICD-10-CM | POA: Insufficient documentation

## 2017-05-05 DIAGNOSIS — J449 Chronic obstructive pulmonary disease, unspecified: Secondary | ICD-10-CM | POA: Insufficient documentation

## 2017-05-05 DIAGNOSIS — Z8379 Family history of other diseases of the digestive system: Secondary | ICD-10-CM | POA: Diagnosis not present

## 2017-05-05 DIAGNOSIS — Z8249 Family history of ischemic heart disease and other diseases of the circulatory system: Secondary | ICD-10-CM | POA: Insufficient documentation

## 2017-05-05 DIAGNOSIS — Z452 Encounter for adjustment and management of vascular access device: Secondary | ICD-10-CM | POA: Diagnosis not present

## 2017-05-05 DIAGNOSIS — I10 Essential (primary) hypertension: Secondary | ICD-10-CM | POA: Diagnosis not present

## 2017-05-05 DIAGNOSIS — Z885 Allergy status to narcotic agent status: Secondary | ICD-10-CM | POA: Insufficient documentation

## 2017-05-05 DIAGNOSIS — C3490 Malignant neoplasm of unspecified part of unspecified bronchus or lung: Secondary | ICD-10-CM | POA: Insufficient documentation

## 2017-05-05 DIAGNOSIS — C3411 Malignant neoplasm of upper lobe, right bronchus or lung: Secondary | ICD-10-CM | POA: Diagnosis not present

## 2017-05-05 HISTORY — PX: PORTA CATH REMOVAL: CATH118286

## 2017-05-05 SURGERY — PORTA CATH REMOVAL
Anesthesia: Moderate Sedation

## 2017-05-05 MED ORDER — ONDANSETRON HCL 4 MG/2ML IJ SOLN: 4.0000 mg | Freq: Four times a day (QID) | INTRAMUSCULAR | Status: DC | PRN

## 2017-05-05 MED ORDER — MIDAZOLAM HCL 2 MG/2ML IJ SOLN
INTRAMUSCULAR | Status: DC | PRN
  Administered 2017-05-05: 2 mg via INTRAVENOUS
  Administered 2017-05-05: 1 mg via INTRAVENOUS

## 2017-05-05 MED ORDER — HYDROMORPHONE HCL 1 MG/ML IJ SOLN: 1.0000 mg | Freq: Once | INTRAMUSCULAR | Status: DC | PRN

## 2017-05-05 MED ORDER — FENTANYL CITRATE (PF) 100 MCG/2ML IJ SOLN
INTRAMUSCULAR | Status: AC
  Filled 2017-05-05: qty 2

## 2017-05-05 MED ORDER — CEFAZOLIN SODIUM-DEXTROSE 2-4 GM/100ML-% IV SOLN
INTRAVENOUS | Status: AC
  Filled 2017-05-05: qty 100

## 2017-05-05 MED ORDER — LIDOCAINE-EPINEPHRINE (PF) 2 %-1:200000 IJ SOLN
INTRAMUSCULAR | Status: AC
  Filled 2017-05-05: qty 20

## 2017-05-05 MED ORDER — FENTANYL CITRATE (PF) 100 MCG/2ML IJ SOLN
INTRAMUSCULAR | Status: DC | PRN
  Administered 2017-05-05: 12.5 ug via INTRAVENOUS
  Administered 2017-05-05: 50 ug via INTRAVENOUS

## 2017-05-05 MED ORDER — MIDAZOLAM HCL 5 MG/5ML IJ SOLN
INTRAMUSCULAR | Status: AC
  Filled 2017-05-05: qty 5

## 2017-05-05 MED ORDER — SODIUM CHLORIDE 0.9 % IV SOLN: INTRAVENOUS | Status: DC

## 2017-05-05 SURGICAL SUPPLY — 6 items
PACK ANGIOGRAPHY (CUSTOM PROCEDURE TRAY) ×2 IMPLANT
PENCIL ELECTRO HAND CTR (MISCELLANEOUS) ×2 IMPLANT
SUT MNCRL AB 4-0 PS2 18 (SUTURE) ×2 IMPLANT
SUT VIC AB 3-0 SH 27 (SUTURE) ×1
SUT VIC AB 3-0 SH 27X BRD (SUTURE) ×1 IMPLANT
TOWEL OR 17X26 4PK STRL BLUE (TOWEL DISPOSABLE) ×2 IMPLANT

## 2017-05-05 NOTE — Op Note (Signed)
[facility]       Operative Note  Date: 05/05/2017  Preoperative diagnosis:  1. Lung cancer, no longer using port  Postoperative diagnosis:  Same as above  Procedures: #1. Removal of left jugular port a cath   Surgeon: Leotis Pain, MD  Anesthesia: Local with moderate conscious sedation for 15 minutes using 3 mg of Versed and 62.5 mcg of Fentanyl  Fluoroscopy time: none  Contrast used: 0  Estimated blood loss: Minimal  Indication for the procedure:  The patient is a 61 y.o. male who has lung cancer and has completed her therapy and no longer needs their Port-A-Cath. The patient desires to have this removed. Risks and benefits including need for potential replacement with recurrent disease were discussed and patient is agreeable to proceed.  Description of procedure: The patient was brought to the vascular and interventional radiology suite. Moderate conscious sedation was administered during a face to face encounter with the patient throughout the procedure with my supervision of the RN administering medicines and monitoring the patient's vital signs, pulse oximetry, telemetry and mental status throughout from the start of the procedure until the patient was taken to the recovery room.  The left neck chest and shoulder were sterilely prepped and draped, and a sterile surgical field was created. The area was then anesthetized with 1% lidocaine copiously. The previous incision was reopened and electrocautery used to dissected down to the port and the catheter. These were dissected free and the catheter was gently removed from the vein in its entirety. The port was dissected out from the fibrous connective tissue and the Prolene sutures were removed. The port was then removed in its entirety including the catheter. The wound was then closed with a 3-0 Vicryl and a 4-0 Monocryl and Dermabond was placed as a dressing. The patient was  then taken to the recovery room in stable condition having tolerated the procedure well.  Complications: none  Condition: stable   Leotis Pain, MD 05/05/2017 8:40 AM   This note was created with Dragon Medical transcription system. Any errors in dictation are purely unintentional.

## 2017-05-05 NOTE — H&P (Signed)
[facility] History & Physical Update  The patient was interviewed and re-examined.  The patient's previous History and Physical has been reviewed and is unchanged.  There is no change in the plan of care. We plan to proceed with the scheduled procedure.  Leotis Pain, MD  05/05/2017, 8:04 AM

## 2017-06-21 ENCOUNTER — Ambulatory Visit (INDEPENDENT_AMBULATORY_CARE_PROVIDER_SITE_OTHER): Payer: Medicaid Other | Admitting: Internal Medicine

## 2017-06-21 ENCOUNTER — Encounter: Payer: Self-pay | Admitting: Internal Medicine

## 2017-06-21 VITALS — BP 112/66 | HR 86 | Resp 16 | Ht 69.0 in | Wt 142.0 lb

## 2017-06-21 DIAGNOSIS — J449 Chronic obstructive pulmonary disease, unspecified: Secondary | ICD-10-CM

## 2017-06-21 NOTE — Progress Notes (Addendum)
[facility] Consultation      Date: 06/21/2017,   MRN# [id number] David [name] 1956/01/11 Code Status:  Code Status History    Date Active Date Inactive Code Status Order ID Comments User Context   12/31/2015  5:40 PM 01/02/2016  2:11 PM Full Code [id number]  Gladstone Lighter, MD Inpatient     Hosp day:@LENGTHOFSTAYDAYS @ Referring MD: @ATDPROV @     PCP:      AdmissionWeight: 142 lb (64.4 kg)                 CurrentWeight: 142 lb (64.4 kg) David [name] is a 61 y.o. old male seen in consultation for RUL nodule at the request of Dr. Grayland Ormond.     CHIEF COMPLAINT:   Follow up COPD   HISTORY OF PRESENT ILLNESS   61 yo white male with extensive smoking history has been dx with Stage 2 Small cell lung cancer dx with RT lung biopsy of RUL nodule in Jan 2017 Patient underwent XRT, ENB 07/2106 negative for malignant cells Follow up with oncology pending   Patient actively smokes, has chronic SOB and DOE and wheezing for many years He has a chronic cough and chest congestion Uses oxygen and he benefits from oxygen therapy  Patient has no signs of infection at this time Patient has very poor appetite and looks very ill. Patient this and cachectic and is losing weight Has increased fatigue  And lethargy  According to previous rest results, patient is pos for Cocaine  Office Spiro obtained 06/16/16 which reveal moderate Obstructive airways disease Patient  Continues to use  oxygen as prescribed Patient uses 1 L of oxygen at night and benefits greatly from this therapy   REVIEW OF SYSTEMS   Review of Systems  Constitutional: Positive for malaise/fatigue and weight loss. Negative for chills, diaphoresis and fever.  HENT: Positive for congestion. Negative for hearing loss.   Eyes: Negative for blurred vision and double vision.  Respiratory: Negative for cough, hemoptysis, sputum production, shortness of breath and wheezing.   Cardiovascular: Negative  for chest pain, palpitations, orthopnea and leg swelling.  Gastrointestinal: Negative for abdominal pain, heartburn, nausea and vomiting.  Musculoskeletal: Positive for joint pain. Negative for back pain, myalgias and neck pain.  Skin: Negative for rash.  Neurological: Negative for dizziness and weakness.  Psychiatric/Behavioral: Negative for depression.  All other systems reviewed and are negative.    VS: BP 112/66 (BP Location: Left Arm, Cuff Size: Normal)   Pulse 86   Resp 16   Ht 5\' 9"  (1.753 m)   Wt 142 lb (64.4 kg)   SpO2 97%   BMI 20.97 kg/m      PHYSICAL EXAM  Physical Exam  Constitutional: He is oriented to person, place, and time. No distress.  Thin, pale and cachectic, sickly looking  HENT:  Head: Normocephalic and atraumatic.  Mouth/Throat: No oropharyngeal exudate.  Eyes: EOM are normal. Pupils are equal, round, and reactive to light. No scleral icterus.  Neck: Neck supple.  Cardiovascular: Normal rate, regular rhythm and normal heart sounds.  No murmur heard. Pulmonary/Chest: No respiratory distress. He has no wheezes. He has no rales.  Abdominal: Soft. Bowel sounds are normal.  Musculoskeletal: Normal range of motion. He exhibits no edema.  Neurological: He is alert and oriented to person, place, and time. No cranial nerve deficit.  Skin: Skin is warm. He is not diaphoretic.  Psychiatric: He has a normal mood and affect.  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   1.  End-stage COPD Continue inhalers as prescribed Albuterol as needed  2.  Chronic hypoxic respiratory  failure Continue oxygen as prescribed Patient uses 1 L of oxygen at night and benefits greatly from this therapy  3.  Stage II small cell lung cancer follow-up with oncology as needed Recommend follow-up with oncology and repeat CT scan if needed   4.Deconditioned state -Recommend increased daily activity and exercise   Patient satisfied with Plan of action and management. All questions answered Follow up in 6 months  David [name], M.D.  [name]  Medical Director [place] Director [facility] Cardio-Pulmonary Department

## 2017-06-21 NOTE — Patient Instructions (Signed)
Continue inhalers as prescribed 

## 2017-06-29 IMAGING — CT CT BIOPSY
3 series · 18 of 32 positions shown, 23 images · non-contrast
Comparison: none

INDICATION: 59-year-old male smoker with a hypermetabolic right upper lobe
pulmonary nodule concerning for primary bronchogenic carcinoma.

[Series 2: routine chest · axial · 0.68mm/px · z∈[+1146,+1326]mm · 8 of 44 slices shown (1 of 2)]
[im 4/44  soft-tissue]
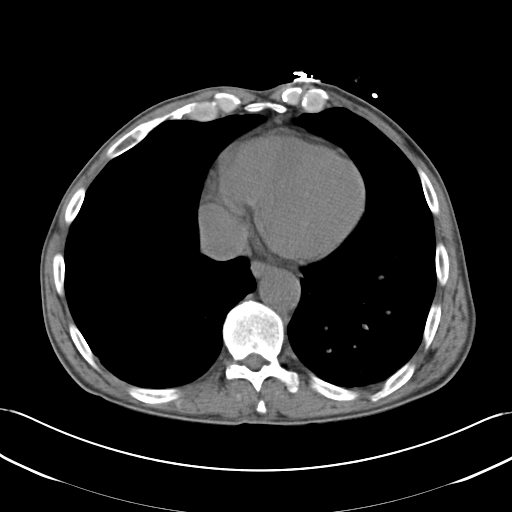
[im 8/44  soft-tissue]
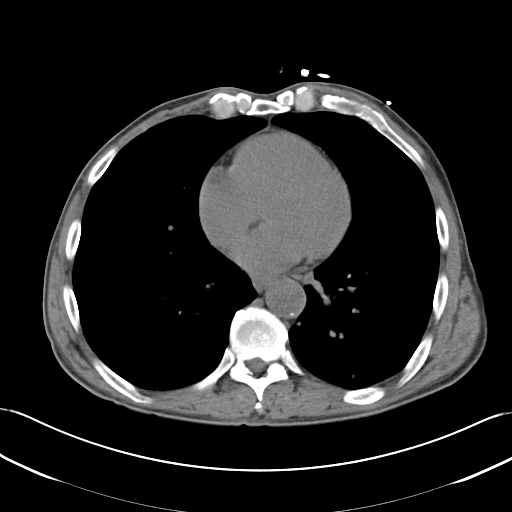
[im 16/44  soft-tissue]
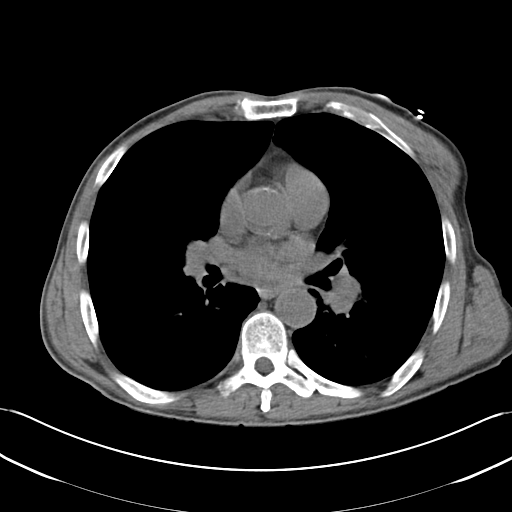
[im 20/44  soft-tissue]
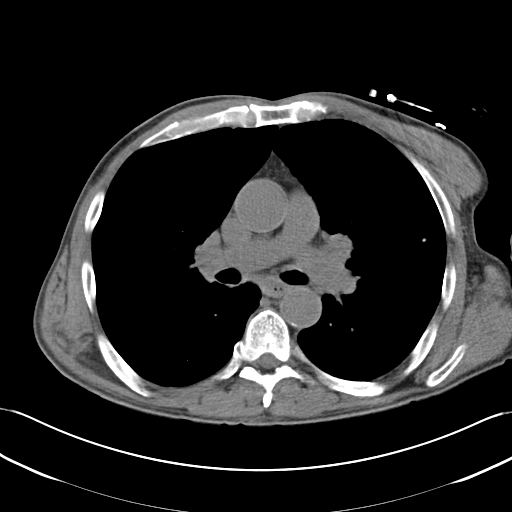
[im 24/44  soft-tissue]
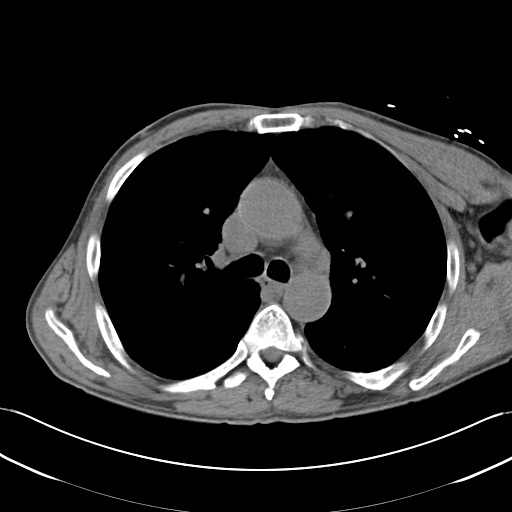
[im 28/44  soft-tissue]
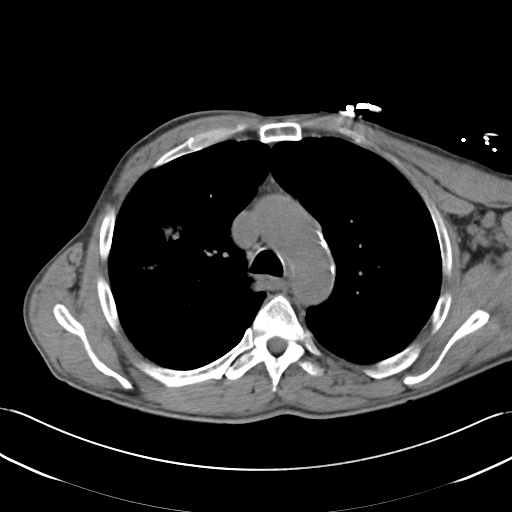
[im 36/44  soft-tissue]
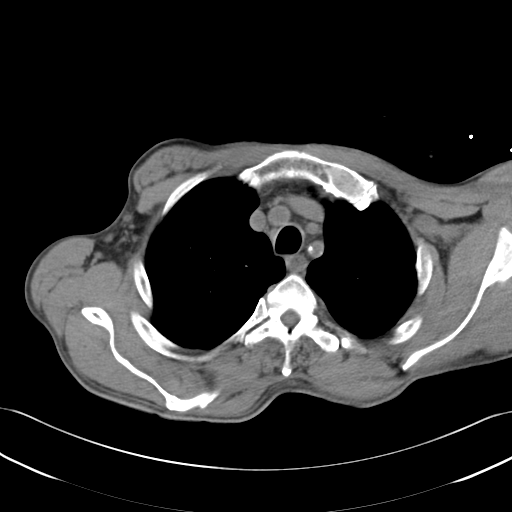
[im 40/44  soft-tissue]
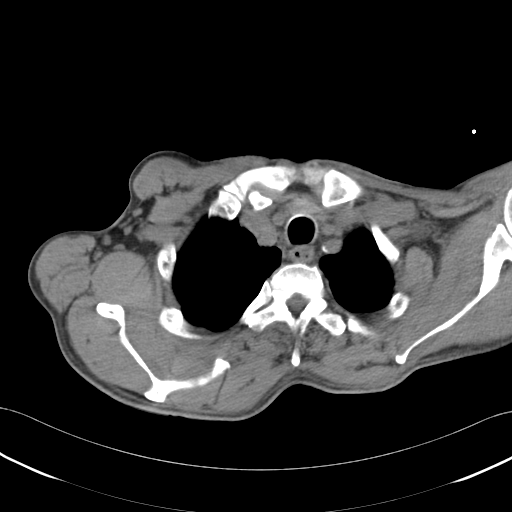

[Series 5: (hospital) 4.8 b30s · axial · 0.39mm/px · z∈[+1258,+1268]mm · 7 of 36 slices shown, 12 images]
[im 5/36  soft-tissue]
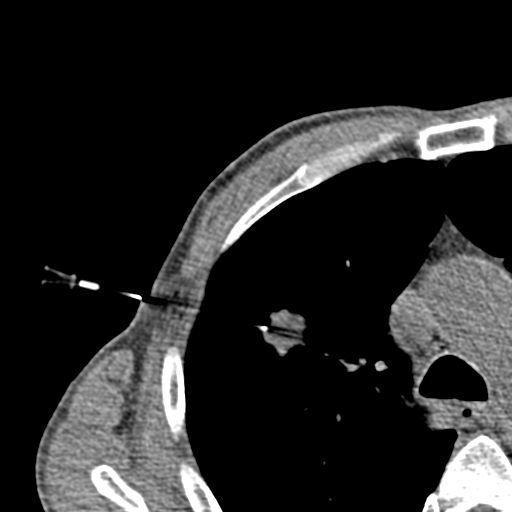
[im 5/36  bone]
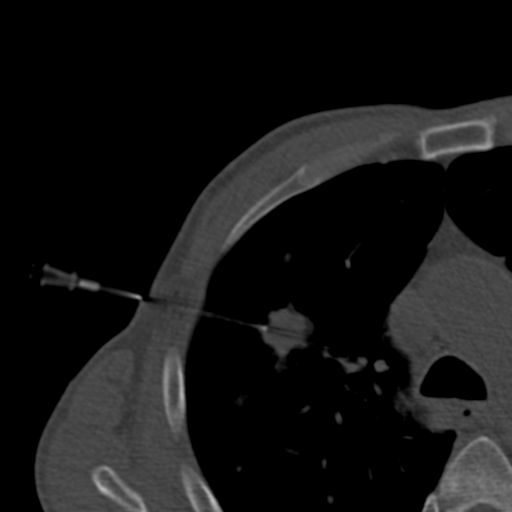
[im 9/36  soft-tissue]
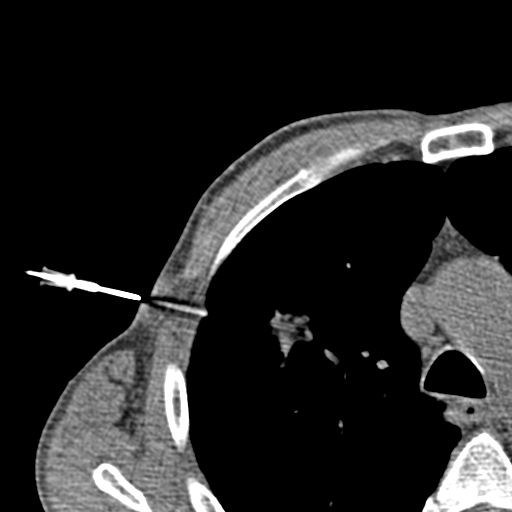
[im 14/36  soft-tissue]
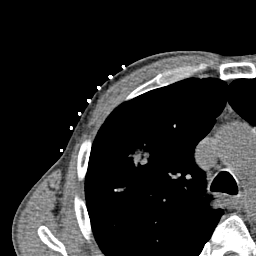
[im 14/36  lung]
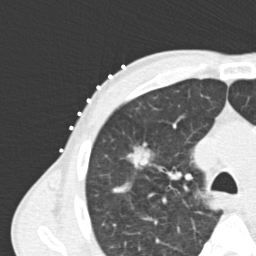
[im 18/36  soft-tissue]
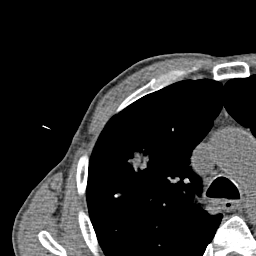
[im 18/36  lung]
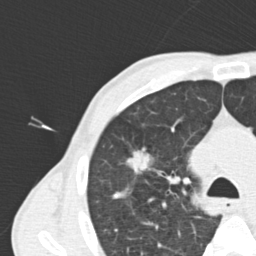
[im 22/36  soft-tissue]
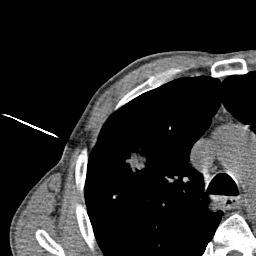
[im 22/36  lung]
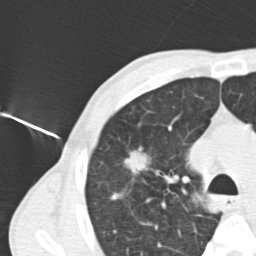
[im 27/36  soft-tissue]
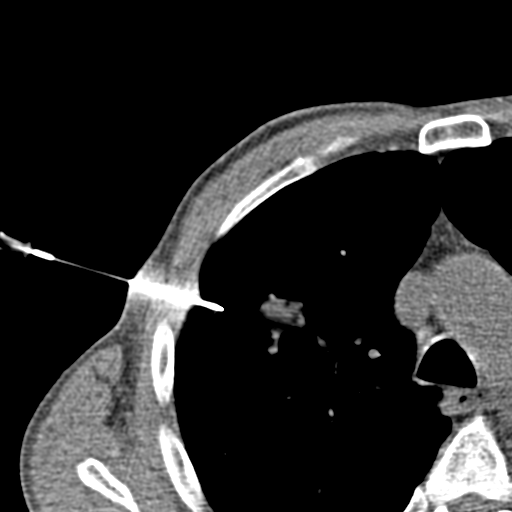
[im 27/36  lung]
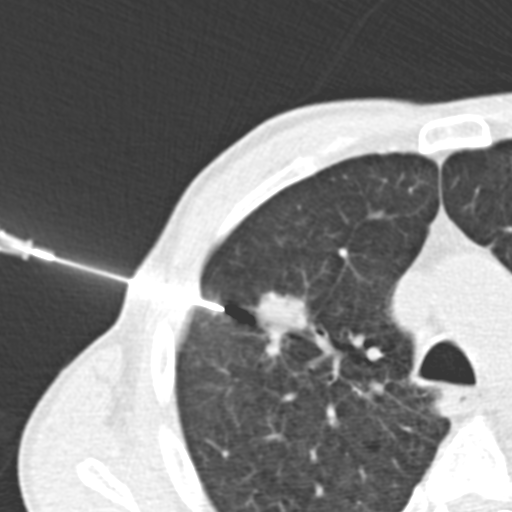
[im 31/36  soft-tissue]
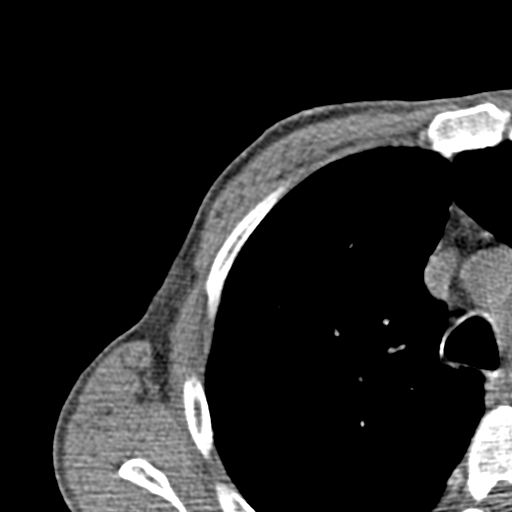

[Series 6: routine chest · axial · 0.68mm/px · z∈[+1151,+1191]mm · 3 of 44 slices shown (2 of 2)]
[im 5/44  soft-tissue]
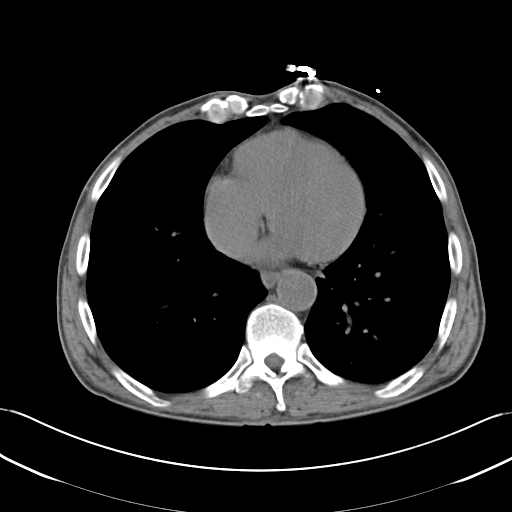
[im 9/44  soft-tissue]
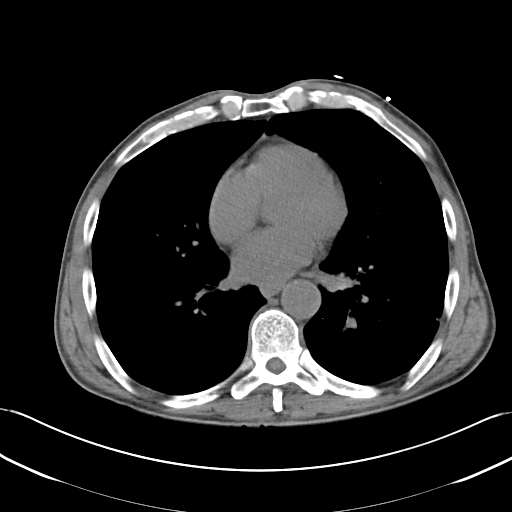
[im 13/44  soft-tissue]
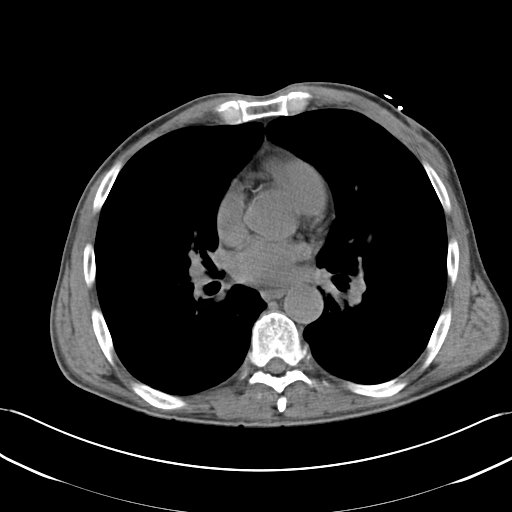

[18 of 32 positions shown; findings below may reference images not displayed]

EXAM:
CT-guided biopsy right upper lobe lobe pulmonary nodule

MEDICATIONS:
None.

ANESTHESIA/SEDATION:
Fentanyl 75 mcg IV; Versed 3 mg IV

Moderate Sedation Time:  20

The patient was continuously monitored during the procedure by the
interventional radiology nurse under my direct supervision.

FLUOROSCOPY TIME:  None

COMPLICATIONS:
None immediate.

Estimated blood loss:  0

PROCEDURE:
Informed written consent was obtained from the patient after a
thorough discussion of the procedural risks, benefits and
alternatives. All questions were addressed. Maximal Sterile Barrier
Technique was utilized including caps, mask, sterile gowns, sterile
gloves, sterile drape, hand hygiene and skin antiseptic. A timeout
was performed prior to the initiation of the procedure.

A planning axial CT scan was performed. The nodule in the right
upper lobe was successfully identified. A suitable skin entry site
was selected and marked. The region was then sterilely prepped and
draped in standard fashion using Betadine skin prep. Local
anesthesia was attained by infiltration with 1% lidocaine. A small
dermatotomy was made. Under intermittent CT fluoroscopic guidance, a
17 gauge trocar needle was advanced into the lung and positioned at
the margin of the nodule.

Multiple 18 gauge core biopsies were then coaxially obtained using
the BioPince automated biopsy device. Biopsy specimens were placed
in formalin and delivered to pathology for further analysis. A bio
sentry device was successfully deployed. Post biopsy axial CT
imaging demonstrates no evidence of immediate complication. There is
no pneumothorax. Mild perilesional alveolar hemorrhage is not
unexpected. The patient tolerated the procedure well.
IMPRESSION: Technically successful CT-guided biopsy right upper lobe pulmonary
nodule.

## 2017-07-12 ENCOUNTER — Encounter: Payer: Self-pay | Admitting: Family Medicine

## 2017-07-12 ENCOUNTER — Ambulatory Visit (INDEPENDENT_AMBULATORY_CARE_PROVIDER_SITE_OTHER): Payer: Medicaid Other | Admitting: Family Medicine

## 2017-07-12 VITALS — BP 121/67 | HR 88 | Temp 97.8°F | Resp 16 | Ht 69.0 in | Wt 138.0 lb

## 2017-07-12 DIAGNOSIS — C3490 Malignant neoplasm of unspecified part of unspecified bronchus or lung: Secondary | ICD-10-CM

## 2017-07-12 DIAGNOSIS — M159 Polyosteoarthritis, unspecified: Secondary | ICD-10-CM

## 2017-07-12 DIAGNOSIS — M150 Primary generalized (osteo)arthritis: Secondary | ICD-10-CM

## 2017-07-12 DIAGNOSIS — M25531 Pain in right wrist: Secondary | ICD-10-CM | POA: Diagnosis not present

## 2017-07-12 MED ORDER — GABAPENTIN 300 MG PO CAPS: 300.0000 mg | ORAL_CAPSULE | Freq: Three times a day (TID) | ORAL | 3 refills | Status: DC

## 2017-07-12 MED ORDER — MELOXICAM 15 MG PO TABS: 15.0000 mg | ORAL_TABLET | Freq: Every day | ORAL | 1 refills | Status: DC

## 2017-07-12 NOTE — Assessment & Plan Note (Signed)
S/p chemotherapy, port removal Followed by [facility] Oncology, Pulmonology

## 2017-07-12 NOTE — Patient Instructions (Signed)
Thank you for coming to the clinic today.  1.  FIRST CALL this office - You were referred by Dr Grayland Ormond to [facility] Rheumatologist (Dr Jefm Bryant)   [place] [address] 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  - Recommend trial of Anti-inflammatory with Meloxicam 15mg  tabs - take one with food and plenty of water ONCE daily every day (breakfast and dinner), for next 2 to 4 weeks, then you may take only as needed - DO NOT TAKE any ibuprofen, aleve, motrin while you are taking this medicine - It is safe to take Tylenol Ext Str 500mg  tabs - take 1 to 2 (max dose 1000mg ) every 6 hours as needed for breakthrough pain, max 24 hour daily dose is 6 to 8 tablets or 4000mg  - Wrist splint will provide support to the tendons and reduce strain - Purchase a THUMB SPICA SPLINT (to limit thumb and wrist flexion) - or ACE wrap - REST is extremely important, the goal is to AVOID re-injury, try to modify activities - Also may try ice packs if swelling, or topical icy hot muscle rub can also help ease worsening pain temporarily  If you get significant worsening pain, weakness in your hand (not due to pain), numbness, tingling, burning, more constant without activity, then follow-up sooner as we may need to consider stronger anti-inflammatory with prednisone, or referral for injection.  Please schedule a follow-up appointment with Dr. Parks Ranger in 4 - 6 weeks as needed for hand/wrist pain if not improving  [facility] ([facility] Address: [place], [place], [place] [address] Hours:  9AM-5PM Phone: [phone]  [facility] [place], [place]  [address] Phone: [phone]  ---------------------------------------------------------------- You were referred to [place], [place] [address] Hours: M-Th 8-5pm / F 8-12noon Phone: [phone]  Please schedule a Follow-up Appointment to: Return in about 6 weeks (around 08/23/2017), or if symptoms worsen or fail to improve, for Right wrist plan.  If you have any other questions or concerns, please feel free to call the clinic or send a message through [facility]. You may also schedule an earlier appointment if necessary.  Additionally, you may be receiving a survey about your experience at our clinic within a few days to 1 week by e-mail or mail. We value your feedback.  Nobie Putnam, DO [place]

## 2017-07-12 NOTE — Progress Notes (Signed)
Subjective:    Patient ID: David [name], male    DOB: Dec 20, 1955, 61 y.o.   MRN: [id number]  David [name] is a 61 y.o. male presenting on 07/12/2017 for Wrist Pain (Right onset month)   HPI   Right Wrist Pain, Subacute - Reports onset symptom Right wrist pain about 1 month ago, with some gradual worsening but overall not improvement, aching pain ranging 5 out of 10 on average, but at worse at night up to 10/10 - Tried ice, heat, muscle rub - Taking Meloxicam 15 limited relief - Cannot take Tylenol due to allergy - He has been trying to do handheld weight lifting exercises at times - Previously discussed this problem with Oncology Dr Grayland Ormond refer to [facility] Rheum 02/2017, however patient never heard back - Denies any known injury or trauma, redness, swelling, other joint pain  Additional PMH - Small Cell Lung CA - s/p chemotherapy  Health Maintenance: - Declines Flu Vaccine  Depression screen [facility] 2/9 09/07/2016 04/20/2016 12/11/2015  Decreased Interest 0 1 0  Down, Depressed, Hopeless 0 1 0  PHQ - 2 Score 0 2 0  Altered sleeping - 3 -  Tired, decreased energy - 3 -  Change in appetite - 0 -  Feeling bad or failure about yourself  - 0 -  Trouble concentrating - 0 -  Moving slowly or fidgety/restless - 0 -  Suicidal thoughts - 0 -  PHQ-9 Score - 8 -    Social History   Tobacco Use  . Smoking status: Current Every Day Smoker    Packs/day: 0.50    Years: 51.00    Pack years: 25.50    Types: Cigarettes    Start date: 08/12/1994  . Smokeless tobacco: Never Used  . Tobacco comment: used to smoke upto 2PPD, recently cut down  Substance Use Topics  . Alcohol use: No    Alcohol/week: 0.0 oz  . Drug use: Yes    Types: Cocaine    Comment: approx. a month quit    Review of Systems Per HPI unless specifically indicated above     Objective:    BP 121/67   Pulse 88   Temp 97.8 F (36.6 C) (Oral)   Resp 16   Ht 5\' 9"  (1.753 m)   Wt 138 lb (62.6 kg)   BMI 20.38 kg/m     Wt Readings from Last 3 Encounters:  07/12/17 138 lb (62.6 kg)  06/21/17 142 lb (64.4 kg)  05/05/17 132 lb (59.9 kg)    Physical Exam  Constitutional: He is oriented to person, place, and time. He appears well-developed and well-nourished. No distress.  Chronically ill-appearing thin and frail 61 year old male appears older than stated age, comfortable, cooperative  HENT:  Head: Normocephalic and atraumatic.  Mouth/Throat: Oropharynx is clear and moist.  Eyes: Conjunctivae are normal. Right eye exhibits no discharge. Left eye exhibits no discharge.  Neck: Normal range of motion. Neck supple.  Cardiovascular: Normal rate, regular rhythm, normal heart sounds and intact distal pulses.  No murmur heard. Pulmonary/Chest: Effort normal.  Musculoskeletal: He exhibits no edema.  Bilateral Hand/Wrist Inspection: Slightly bulky appearance R dorsal wrist ulnar aspect, symmetrical. Some bulky MCP joints, L thumb CMC bulky, no edema or erythema. Palpation: Mild to moderate tender R dorsal wrist. Also L thumb, including MCP, base of thumb. No distinct anatomical snuff box or scaphoid tenderness.  ROM: full active wrist ROM flex / ext, ulnar / radial deviation, some pain with ulnar  and radial deviation Special Testing: Negative Tinel's median nerve paresthesia but some positive pain Strength: 5/5 grip limited by pain, thumb opposition, wrist flex/ext Neurovascular: distally intact  Neurological: He is alert and oriented to person, place, and time.  Skin: Skin is warm and dry. No rash noted. He is not diaphoretic. No erythema.  Dry skin with flaking  Psychiatric: He has a normal mood and affect. His behavior is normal.  Good eye contact, normal speech and thoughts  Nursing note and vitals reviewed.  Results for orders placed or performed during the hospital encounter of 71/24/58  Basic metabolic panel  Result Value Ref Range   Sodium 139 135 - 145 mmol/L   Potassium 3.7 3.5 - 5.1 mmol/L    Chloride 104 101 - 111 mmol/L   CO2 25 22 - 32 mmol/L   Glucose, Bld 96 65 - 99 mg/dL   BUN 8 6 - 20 mg/dL   Creatinine, Ser 1.04 0.61 - 1.24 mg/dL   Calcium 9.1 8.9 - 10.3 mg/dL   GFR calc non Af Amer >60 >60 mL/min   GFR calc Af Amer >60 >60 mL/min   Anion gap 10 5 - 15  CBC  Result Value Ref Range   WBC 7.9 3.8 - 10.6 K/uL   RBC 3.94 (L) 4.40 - 5.90 MIL/uL   Hemoglobin 12.4 (L) 13.0 - 18.0 g/dL   HCT 35.0 (L) 40.0 - 52.0 %   MCV 89.0 80.0 - 100.0 fL   MCH 31.6 26.0 - 34.0 pg   MCHC 35.5 32.0 - 36.0 g/dL   RDW 14.1 11.5 - 14.5 %   Platelets 423 150 - 440 K/uL  Troponin I  Result Value Ref Range   Troponin I <0.03 <0.03 ng/mL      Assessment & Plan:   Problem List Items Addressed This Visit    Primary osteoarthritis involving multiple joints (Chronic)   Relevant Medications   gabapentin (NEURONTIN) 300 MG capsule   meloxicam (MOBIC) 15 MG tablet   Small cell lung cancer (HCC)    S/p chemotherapy, port removal Followed by [facility] Oncology, Pulmonology      Relevant Medications   gabapentin (NEURONTIN) 300 MG capsule    Other Visit Diagnoses    Right wrist pain    -  Primary  Suspected MSK etiology with OA/DJD, possibly overuse in frail patient, s/p cancer without known mets. - Offered wrist x-ray, no known trauma, exam not entirely consistent with fracture - Refill Gabapentin, Meloxicam - Declined to refill Tramadol from old rx, since patient has already breached controlled contract in past with cocaine positive UDS - Recommend splint and support avoid worsening injury - Handout given with [facility], as patient was already referred, advised him to call to check status - If not heard back can notify office and we may refer him to other office such as Ortho ([facility] or [facility])       Meds ordered this encounter  Medications  . gabapentin (NEURONTIN) 300 MG capsule    Sig: Take 1 capsule (300 mg total) by mouth 3 (three) times daily.    Dispense:  90 capsule     Refill:  3  . meloxicam (MOBIC) 15 MG tablet    Sig: Take 1 tablet (15 mg total) by mouth daily.    Dispense:  90 tablet    Refill:  1    Follow up plan: Return in about 6 weeks (around 08/23/2017), or if symptoms worsen or fail to  improve, for Right wrist plan.  Nobie Putnam, DO [facility] 07/12/2017, 10:27 PM

## 2017-07-15 ENCOUNTER — Other Ambulatory Visit: Payer: Self-pay | Admitting: Oncology

## 2017-07-29 IMAGING — DX DG CHEST 1V PORT
1 series · 2 of 2 positions shown · non-contrast
Comparison: Single view of the chest 09/01/2015. PA and lateral
chest 08/11/2015. CT chest 08/11/2015.

CLINICAL DATA: Difficulty breathing today. Initial encounter.
History of lung cancer.

EXAM:
PORTABLE CHEST 1 VIEW

[Series 1: chest ap · 0.14mm/px · 2 of 2 slices shown]
[im 1/2]
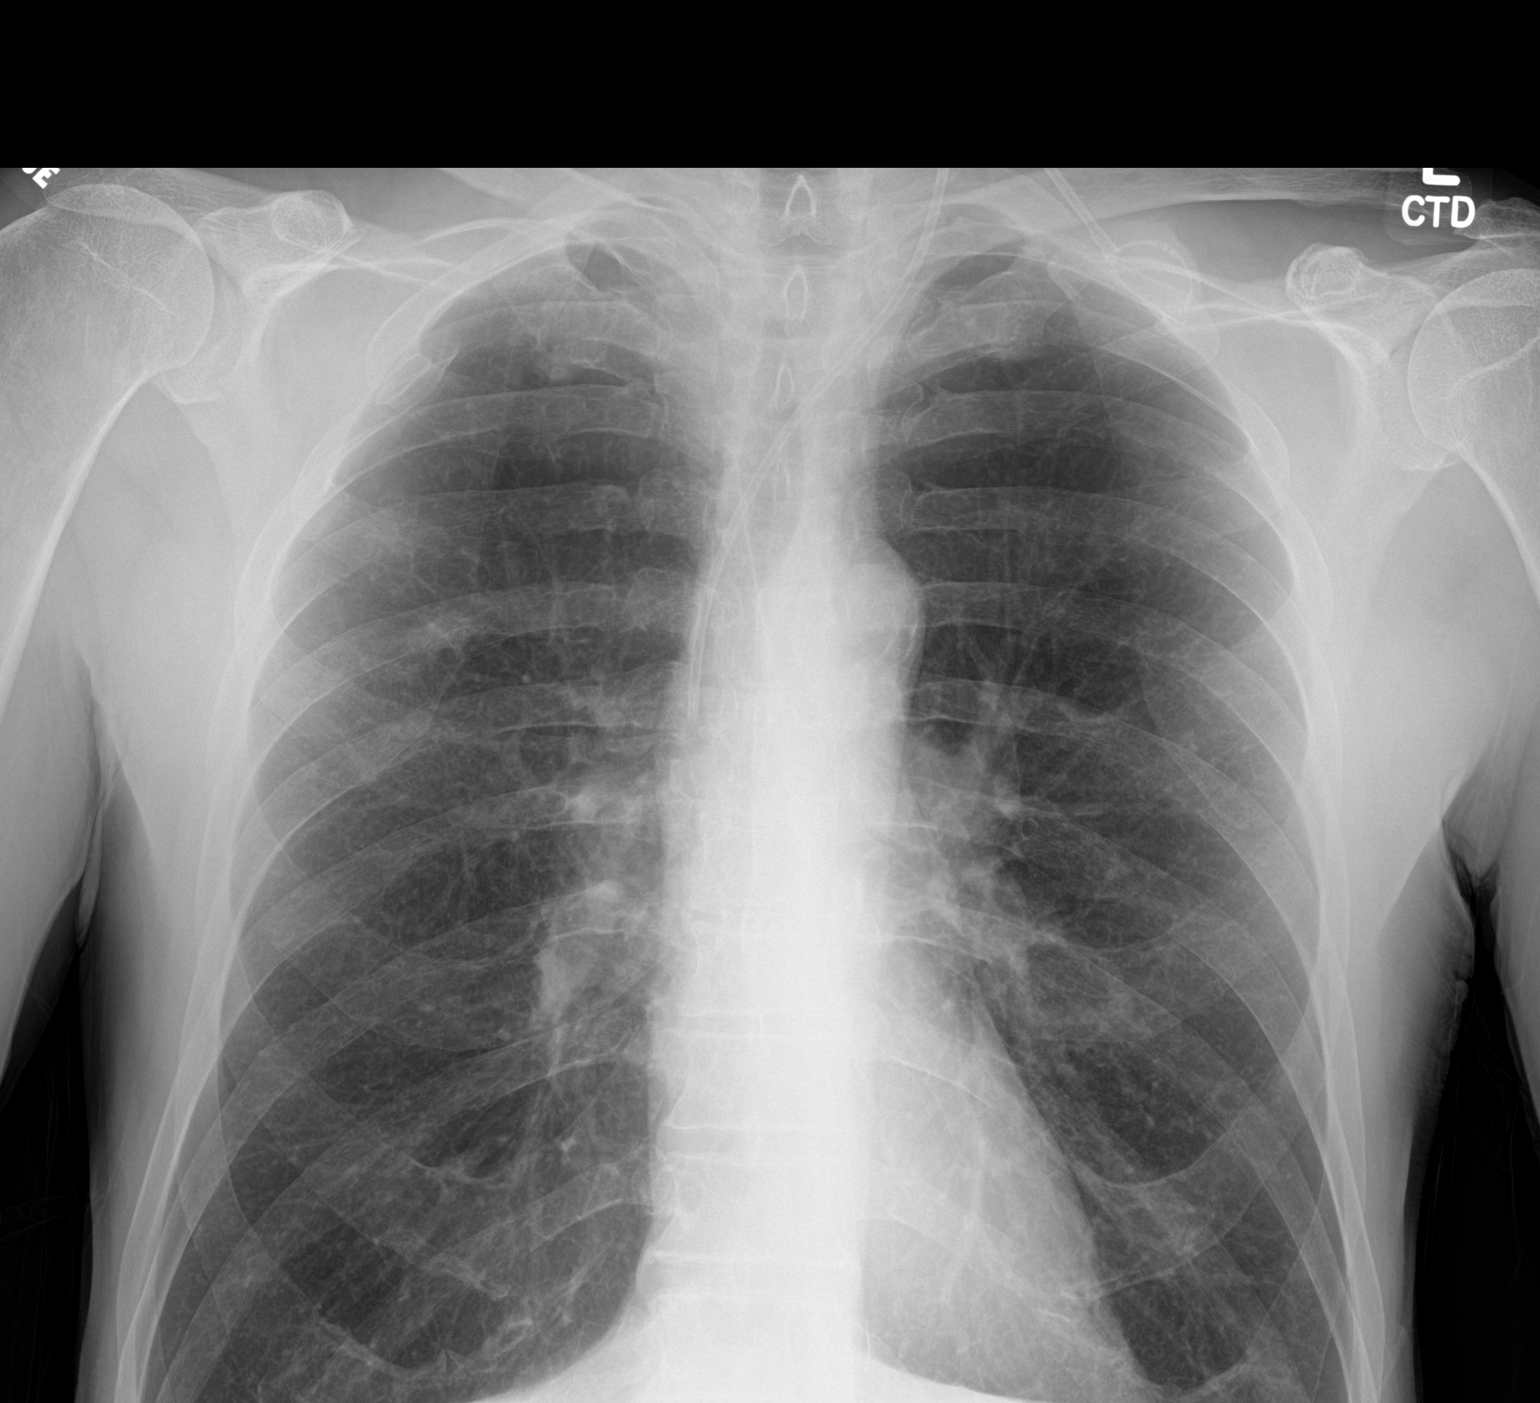
[im 2/2]
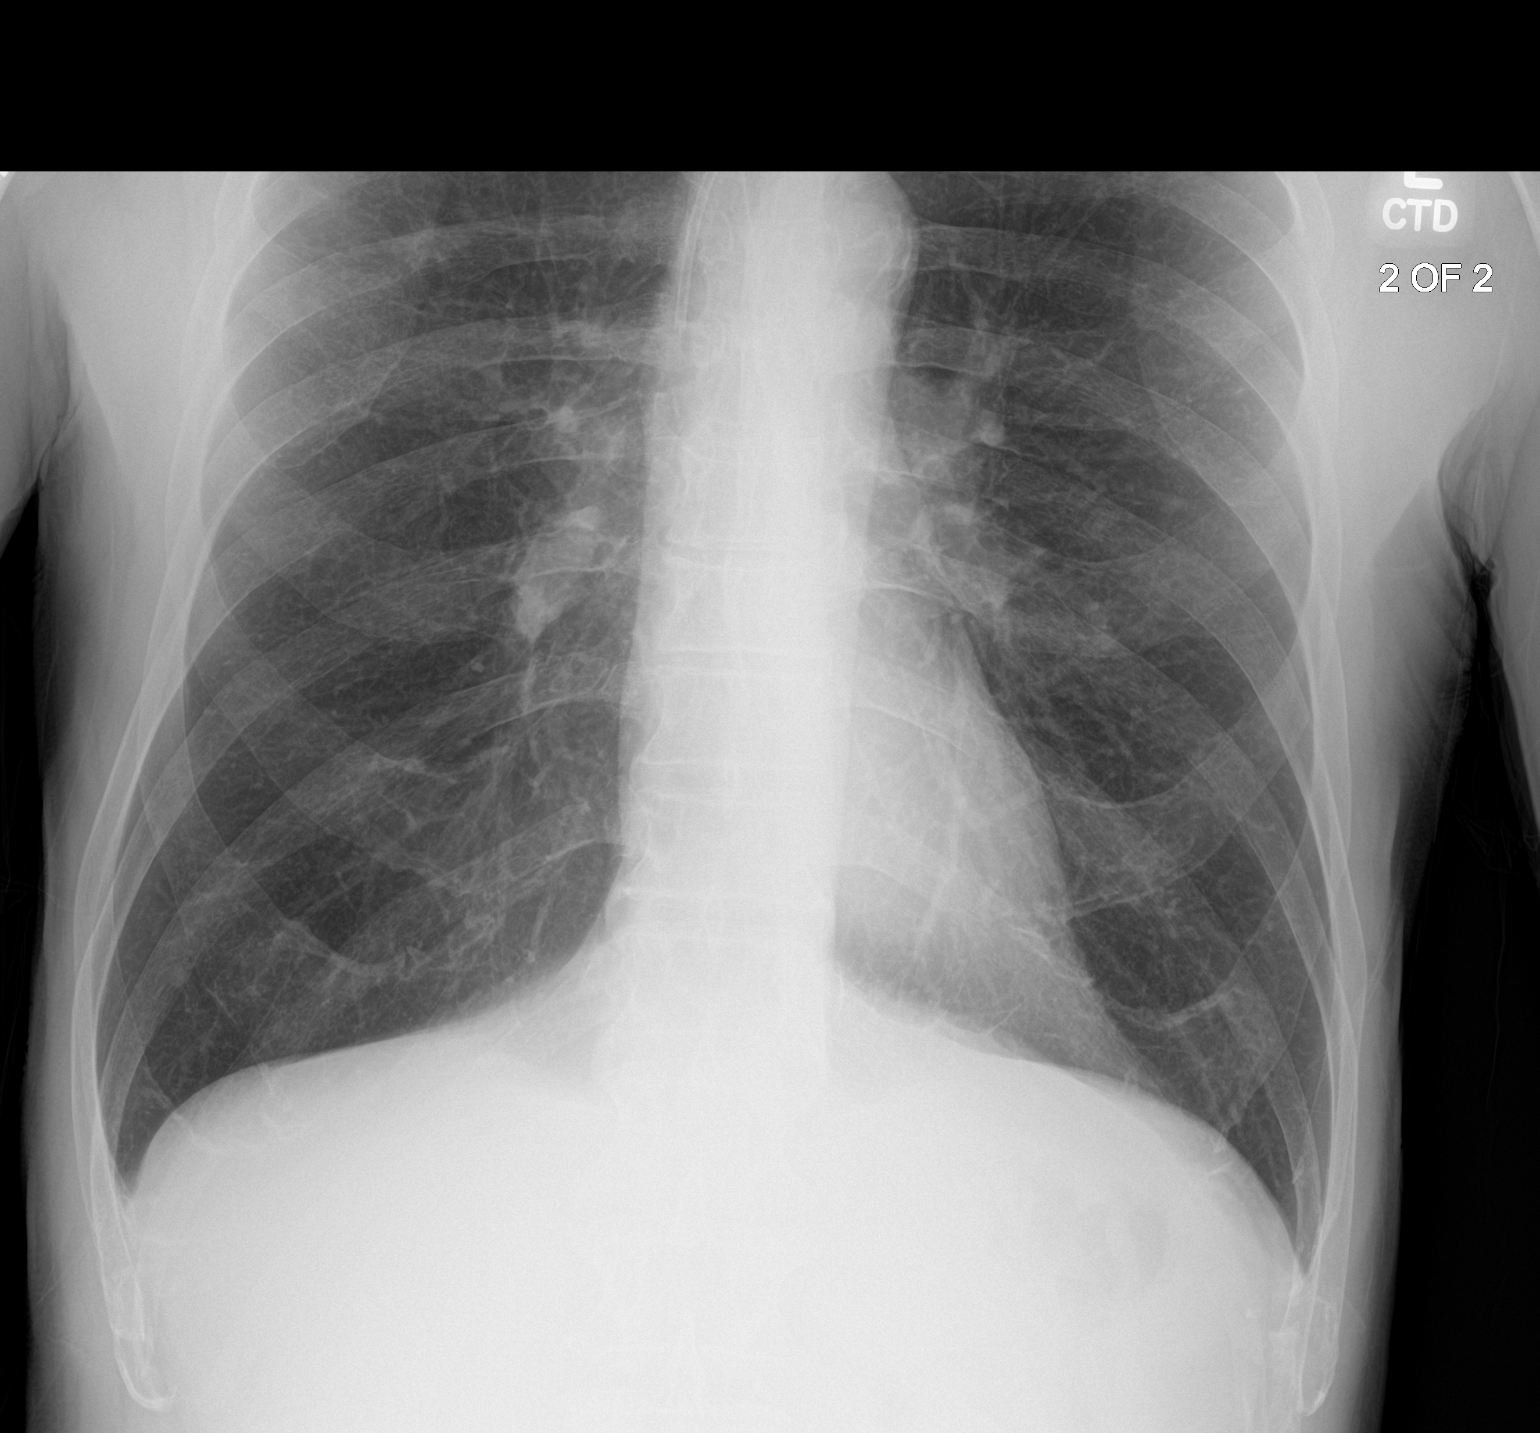

[2 of 2 positions shown; findings below may reference images not displayed]

FINDINGS: The lungs are markedly emphysematous. No consolidative process,
pneumothorax or effusion is identified. Pulmonary nodule in the
right upper lobe is seen as on the prior CT but not as clearly seen
as on the 08/11/2015 exam. The left lung is clear. The costophrenic
angles are off the margin of film. Heart size is normal. Port-A-Cath
is in place.
IMPRESSION: No acute disease.

Emphysema.

Pulmonary nodule seen on comparison plain films is more difficult to
visualize today.

## 2017-08-16 ENCOUNTER — Other Ambulatory Visit: Payer: Medicaid Other

## 2017-08-16 ENCOUNTER — Ambulatory Visit: Admission: RE | Admit: 2017-08-16 | Payer: Medicaid Other | Source: Ambulatory Visit

## 2017-08-16 ENCOUNTER — Emergency Department: Payer: Medicaid Other

## 2017-08-16 ENCOUNTER — Other Ambulatory Visit: Payer: Self-pay

## 2017-08-16 ENCOUNTER — Inpatient Hospital Stay: Payer: Medicaid Other | Attending: Oncology

## 2017-08-16 ENCOUNTER — Telehealth: Payer: Self-pay | Admitting: Internal Medicine

## 2017-08-16 ENCOUNTER — Emergency Department
Admission: EM | Admit: 2017-08-16 | Discharge: 2017-08-16 | Disposition: A | Discharge status: Home / Self Care | Payer: Medicaid Other | Attending: Emergency Medicine | Admitting: Emergency Medicine

## 2017-08-16 DIAGNOSIS — J449 Chronic obstructive pulmonary disease, unspecified: Secondary | ICD-10-CM | POA: Diagnosis not present

## 2017-08-16 DIAGNOSIS — R0981 Nasal congestion: Secondary | ICD-10-CM | POA: Diagnosis not present

## 2017-08-16 DIAGNOSIS — I10 Essential (primary) hypertension: Secondary | ICD-10-CM | POA: Diagnosis not present

## 2017-08-16 DIAGNOSIS — R911 Solitary pulmonary nodule: Secondary | ICD-10-CM | POA: Insufficient documentation

## 2017-08-16 DIAGNOSIS — C3491 Malignant neoplasm of unspecified part of right bronchus or lung: Secondary | ICD-10-CM | POA: Insufficient documentation

## 2017-08-16 DIAGNOSIS — R05 Cough: Secondary | ICD-10-CM | POA: Diagnosis not present

## 2017-08-16 DIAGNOSIS — R918 Other nonspecific abnormal finding of lung field: Secondary | ICD-10-CM | POA: Diagnosis not present

## 2017-08-16 DIAGNOSIS — J45909 Unspecified asthma, uncomplicated: Secondary | ICD-10-CM | POA: Diagnosis not present

## 2017-08-16 DIAGNOSIS — J111 Influenza due to unidentified influenza virus with other respiratory manifestations: Secondary | ICD-10-CM

## 2017-08-16 DIAGNOSIS — C3490 Malignant neoplasm of unspecified part of unspecified bronchus or lung: Secondary | ICD-10-CM | POA: Diagnosis not present

## 2017-08-16 DIAGNOSIS — R69 Illness, unspecified: Secondary | ICD-10-CM

## 2017-08-16 DIAGNOSIS — M79601 Pain in right arm: Secondary | ICD-10-CM | POA: Diagnosis present

## 2017-08-16 DIAGNOSIS — F17210 Nicotine dependence, cigarettes, uncomplicated: Secondary | ICD-10-CM | POA: Diagnosis not present

## 2017-08-16 DIAGNOSIS — R52 Pain, unspecified: Secondary | ICD-10-CM

## 2017-08-16 DIAGNOSIS — Z79899 Other long term (current) drug therapy: Secondary | ICD-10-CM | POA: Diagnosis not present

## 2017-08-16 DIAGNOSIS — M79602 Pain in left arm: Secondary | ICD-10-CM | POA: Insufficient documentation

## 2017-08-16 DIAGNOSIS — M2550 Pain in unspecified joint: Secondary | ICD-10-CM | POA: Insufficient documentation

## 2017-08-16 DIAGNOSIS — Z9221 Personal history of antineoplastic chemotherapy: Secondary | ICD-10-CM | POA: Insufficient documentation

## 2017-08-16 DIAGNOSIS — Z85820 Personal history of malignant melanoma of skin: Secondary | ICD-10-CM | POA: Insufficient documentation

## 2017-08-16 LAB — CBC WITH DIFFERENTIAL/PLATELET
BASOS ABS: 0 10*3/uL (ref 0–0.1)
BASOS PCT: 1 %
EOS ABS: 0.1 10*3/uL (ref 0–0.7)
Eosinophils Relative: 3 %
HEMATOCRIT: 33.8 % — AB (ref 40.0–52.0)
Hemoglobin: 11.3 g/dL — ABNORMAL LOW (ref 13.0–18.0)
Lymphocytes Relative: 21 %
Lymphs Abs: 1 10*3/uL (ref 1.0–3.6)
MCH: 29.1 pg (ref 26.0–34.0)
MCHC: 33.4 g/dL (ref 32.0–36.0)
MCV: 87.1 fL (ref 80.0–100.0)
MONO ABS: 0.5 10*3/uL (ref 0.2–1.0)
MONOS PCT: 12 %
NEUTROS ABS: 3 10*3/uL (ref 1.4–6.5)
Neutrophils Relative %: 63 %
PLATELETS: 269 10*3/uL (ref 150–440)
RBC: 3.88 MIL/uL — ABNORMAL LOW (ref 4.40–5.90)
RDW: 14 % (ref 11.5–14.5)
WBC: 4.7 10*3/uL (ref 3.8–10.6)

## 2017-08-16 LAB — COMPREHENSIVE METABOLIC PANEL
ALBUMIN: 3.2 g/dL — AB (ref 3.5–5.0)
ALK PHOS: 56 U/L (ref 38–126)
ALT: 8 U/L — AB (ref 17–63)
ANION GAP: 8 (ref 5–15)
AST: 15 U/L (ref 15–41)
BILIRUBIN TOTAL: 0.2 mg/dL — AB (ref 0.3–1.2)
BUN: 16 mg/dL (ref 6–20)
CALCIUM: 8.7 mg/dL — AB (ref 8.9–10.3)
CO2: 25 mmol/L (ref 22–32)
CREATININE: 0.97 mg/dL (ref 0.61–1.24)
Chloride: 105 mmol/L (ref 101–111)
GFR calc Af Amer: >60 mL/min (ref >60)
GFR calc non Af Amer: >60 mL/min (ref >60)
Glucose, Bld: 123 mg/dL — ABNORMAL HIGH (ref 65–99)
Potassium: 3.6 mmol/L (ref 3.5–5.1)
Sodium: 138 mmol/L (ref 135–145)
TOTAL PROTEIN: 6.6 g/dL (ref 6.5–8.1)

## 2017-08-16 LAB — LACTIC ACID, PLASMA: Lactic Acid, Venous: 0.9 mmol/L (ref 0.5–1.9)

## 2017-08-16 LAB — INFLUENZA PANEL BY PCR (TYPE A & B)
INFLAPCR: NEGATIVE
Influenza B By PCR: NEGATIVE

## 2017-08-16 LAB — URIC ACID: URIC ACID, SERUM: 4.7 mg/dL (ref 4.4–7.6)

## 2017-08-16 MED ORDER — SODIUM CHLORIDE 0.9 % IV BOLUS (SEPSIS)
1000.0000 mL | Freq: Once | INTRAVENOUS | Status: AC
  Administered 2017-08-16: 1000 mL via INTRAVENOUS

## 2017-08-16 MED ORDER — TRAMADOL HCL 50 MG PO TABS: 50.0000 mg | ORAL_TABLET | Freq: Four times a day (QID) | ORAL | 0 refills | Status: DC | PRN

## 2017-08-16 NOTE — ED Triage Notes (Signed)
Pt to ER c/o body aches X 1 week. Pt denies fever. Nasal congestion, cough. Pt alert and oriented X4, active, cooperative, pt in NAD. RR even and unlabored, color WNL.  Placed in mask.

## 2017-08-16 NOTE — ED Provider Notes (Signed)
[facility] Emergency Department Provider Note  ____________________________________________  Time seen: Approximately 5:11 PM  I have reviewed the triage vital signs and the nursing notes.   HISTORY  Chief Complaint Generalized Body Aches    HPI David [name] is a 62 y.o. male who presents the emergency department with multiple complaints.  Patient reports that he has had generalized body aches, chills, nasal congestion, coughing times 1 week.  Patient reports that symptoms began rather suddenly.  He denies any fevers.  Patient reports that the body aches are more severe in bilateral upper extremities and left hip region.  Patient denies any trauma to the area.  Patient denies any headache, visual changes, sore throat, difficulty breathing, abdominal pain, nausea vomiting, diarrhea or constipation.  Patient reports that he has had a decreased appetite and decreased oral intake over the past week.  He denies any medications at home for this complaint.  Patient has a significant medical history of arthritis, asthma, COPD, gout, lung cancer.  Patient states that his lung cancer is in remission at this time and he has follow-up in 1 week for repeat imaging.  Patient denies any difficulty breathing or hemoptysis with his cough.  Past Medical History:  Diagnosis Date  . Arthritis   . Asthma   . COPD (chronic obstructive pulmonary disease) (HCC)    not on home o2  . Gout   . History of kidney stones   . History of melanoma   . Hypertension   . Oxygen deficiency    O2 1 LITER [facility]  . Pain    chronic pain all over  . Small cell lung cancer (HCC)    melanoma  . Tobacco use     Patient Active Problem List   Diagnosis Date Noted  . Hypertension 04/26/2017  . Chronic pain syndrome 09/28/2016  . Long term current use of opiate analgesic 09/28/2016  . Long term prescription opiate use 09/28/2016  . Disturbance of skin sensation 09/28/2016  . Lung mass   .  Anemia 07/19/2016  . Multiple lung nodules 07/19/2016  . Chest pain 07/18/2016  . Primary osteoarthritis involving multiple joints 06/23/2016  . Chronic bursitis of left shoulder 06/23/2016  . Chronic pain of both shoulders 06/23/2016  . Polysubstance abuse ([place]) 06/23/2016  . Tobacco abuse 04/20/2016  . History of melanoma 04/20/2016  . COPD (chronic obstructive pulmonary disease) ([place]) 04/20/2016  . Gout 04/20/2016  . Adjustment disorder 04/20/2016  . Insomnia 04/20/2016  . Chronic pain due to neoplasm 04/20/2016  . Opiate use 04/20/2016  . Small cell lung cancer ([place]) 09/08/2015    Past Surgical History:  Procedure Laterality Date  . ELECTROMAGNETIC NAVIGATION BROCHOSCOPY N/A 07/22/2016   Procedure: ELECTROMAGNETIC NAVIGATION BRONCHOSCOPY;  Surgeon: Flora Lipps, MD;  Location: [facility] ORS;  Service: Cardiopulmonary;  Laterality: N/A;  . PERIPHERAL VASCULAR CATHETERIZATION N/A 09/15/2015   Procedure: Glori Luis Cath Insertion;  Surgeon: Algernon Huxley, MD;  Location: [place] [facility];  Service: Cardiovascular;  Laterality: N/A;  . PORTA CATH REMOVAL N/A 05/05/2017   Procedure: Glori Luis Cath Removal;  Surgeon: Algernon Huxley, MD;  Location: [place] [facility];  Service: Cardiovascular;  Laterality: N/A;  . SKIN GRAFT     [facility]    Prior to Admission medications   Medication Sig Start Date End Date Taking? Authorizing Provider  albuterol (PROAIR HFA) 108 (90 Base) MCG/ACT inhaler Inhale 2 puffs into the lungs every 6 (six) hours as needed for wheezing or shortness  of breath. 05/02/17   Karamalegos, Devonne Doughty, DO  feeding supplement, ENSURE ENLIVE, (ENSURE ENLIVE) LIQD Take 237 mLs by mouth 3 (three) times daily between meals. 07/19/16   Theodoro Grist, MD  gabapentin (NEURONTIN) 300 MG capsule Take 1 capsule (300 mg total) by mouth 3 (three) times daily. 07/12/17   Karamalegos, Devonne Doughty, DO  meloxicam (MOBIC) 15 MG tablet Take 1 tablet (15 mg total) by mouth daily. 07/12/17   Karamalegos,  Devonne Doughty, DO  pantoprazole (PROTONIX) 40 MG tablet Take 1 tablet (40 mg total) by mouth daily. 05/02/17   Karamalegos, Devonne Doughty, DO  tiotropium (SPIRIVA) 18 MCG inhalation capsule Place 1 capsule (18 mcg total) into inhaler and inhale daily. 05/02/17   Karamalegos, Devonne Doughty, DO  traMADol (ULTRAM) 50 MG tablet Take 1 tablet (50 mg total) by mouth every 6 (six) hours as needed. 08/16/17   Cuthriell, Charline Bills, PA-C    Allergies Tylenol [acetaminophen] and Vicodin [hydrocodone-acetaminophen]  Family History  Problem Relation Age of Onset  . Lung cancer Mother   . Heart attack Mother 63  . Cirrhosis Father 110    Social History Social History   Tobacco Use  . Smoking status: Current Every Day Smoker    Packs/day: 0.50    Years: 51.00    Pack years: 25.50    Types: Cigarettes    Start date: 08/12/1994  . Smokeless tobacco: Never Used  . Tobacco comment: used to smoke upto 2PPD, recently cut down  Substance Use Topics  . Alcohol use: No    Alcohol/week: 0.0 oz  . Drug use: Yes    Types: Cocaine    Comment: approx. a month quit     Review of Systems  Constitutional: Positive for chills but denies fever Eyes: No visual changes. No discharge ENT: No nasal congestion Cardiovascular: no chest pain. Respiratory: Positive cough. No SOB. Gastrointestinal: No abdominal pain.  No nausea, no vomiting.  No diarrhea.  No constipation. Genitourinary: Negative for dysuria. No hematuria Musculoskeletal: Positive for generalized body aches worse in the bilateral upper extremities and left hip. Skin: Negative for rash, abrasions, lacerations, ecchymosis. Neurological: Negative for headaches, focal weakness or numbness. 10-point ROS otherwise negative.  ____________________________________________   PHYSICAL EXAM:  VITAL SIGNS: ED Triage Vitals  Enc Vitals Group     BP 08/16/17 1655 118/72     Pulse Rate 08/16/17 1655 95     Resp 08/16/17 1655 18     Temp 08/16/17 1655 98.5 F  (36.9 C)     Temp Source 08/16/17 1655 Oral     SpO2 08/16/17 1655 97 %     Weight 08/16/17 1655 140 lb (63.5 kg)     Height 08/16/17 1655 5\' 9"  (1.753 m)     Head Circumference --      Peak Flow --      Pain Score 08/16/17 1654 8     Pain Loc --      Pain Edu? --      Excl. in [place]? --      Constitutional: Alert and oriented. Well appearing and in no acute distress. Eyes: Conjunctivae are normal. PERRL. EOMI. Head: Atraumatic. ENT:      Ears: EACs and TMs unremarkable bilaterally.      Nose: Moderate clear congestion/rhinnorhea.      Mouth/Throat: Mucous membranes are moist.  Pharynx is nonerythematous and nonedematous.  Uvula is midline. Neck: No stridor.  Neck is supple full range of motion Hematological/Lymphatic/Immunilogical: No cervical lymphadenopathy. Cardiovascular:  Normal rate, regular rhythm. Normal S1 and S2.  Good peripheral circulation. Respiratory: Normal respiratory effort without tachypnea or retractions. Lungs CTAB. Good air entry to the bases with no decreased or absent breath sounds. Gastrointestinal: Bowel sounds 4 quadrants. Soft and nontender to palpation. No guarding or rigidity. No palpable masses. No distention. No CVA tenderness. Musculoskeletal: Full range of motion to all extremities. No gross deformities appreciated.  Patient is nontender to palpation over bilateral upper extremities, spine, bilateral hips.  No palpable abnormalities.  Radial pulse intact bilateral upper extremities.  Dorsalis pedis pulse intact bilateral lower extremities.  Sensation intact all 4 extremities. Neurologic:  Normal speech and language. No gross focal neurologic deficits are appreciated.  Skin:  Skin is warm, dry and intact. No rash noted. Psychiatric: Mood and affect are normal. Speech and behavior are normal. Patient exhibits appropriate insight and judgement.   ____________________________________________   LABS (all labs ordered are listed, but only abnormal results  are displayed)  Labs Reviewed  COMPREHENSIVE METABOLIC PANEL - Abnormal; Notable for the following components:      Result Value   Glucose, Bld 123 (*)    Calcium 8.7 (*)    Albumin 3.2 (*)    ALT 8 (*)    Total Bilirubin 0.2 (*)    All other components within normal limits  CBC WITH DIFFERENTIAL/PLATELET - Abnormal; Notable for the following components:   RBC 3.88 (*)    Hemoglobin 11.3 (*)    HCT 33.8 (*)    All other components within normal limits  URIC ACID  INFLUENZA PANEL BY PCR (TYPE A & B)  LACTIC ACID, PLASMA  LACTIC ACID, PLASMA   ____________________________________________  EKG   ____________________________________________  RADIOLOGY Diamantina Providence Cuthriell, personally viewed and evaluated these images (plain radiographs) as part of my medical decision making, as well as reviewing the written report by the radiologist.  Dg Chest 2 View  Result Date: 08/16/2017 CLINICAL DATA:  Cough, congestion and body aches over the last week. Smoking history. History of small cell lung cancer. EXAM: CHEST  2 VIEW COMPARISON:  02/03/2017.  07/18/2016. FINDINGS: Heart size is normal. There is aortic atherosclerosis. Background pattern of emphysema with increased interstitial markings. Focal density in the right suprahilar region appears more prominent, measuring 18 mm. This is worrisome for recurrent tumor in this location. No effusions. No consolidation or collapse. No significant bone finding. IMPRESSION: Chronic emphysema and pulmonary scarring. No sign of acute pneumonia or collapse. Increasing focal density in the right suprahilar region compared to the previous studies, worrisome for recurrent tumor in this location. Electronically Signed   By: Nelson Chimes M.D.   On: 08/16/2017 17:48    ____________________________________________    PROCEDURES  Procedure(s) performed:    Procedures    Medications  sodium chloride 0.9 % bolus 1,000 mL (1,000 mLs Intravenous New  Bag/Given 08/16/17 1807)     ____________________________________________   INITIAL IMPRESSION / ASSESSMENT AND PLAN / ED COURSE  Pertinent labs & imaging results that were available during my care of the patient were reviewed by me and considered in my medical decision making (see chart for details).  Review of the [place] CSRS was performed in accordance of the [place] prior to dispensing any controlled drugs.  Clinical Course as of Aug 16 1852  Tue Aug 16, 2017  1062 Patient presented to the emergency department with multiple complaints, all relatively vague.  Patient has had 1 week history of generalized body aches, nasal congestion, cough.  Significant medical history.  At this time, initial differential includes influenza versus viral URI versus dehydration versus injury versus electrolyte imbalance versus recurrent lung cancer.  Patient will be evaluated with labs and chest x-ray.  [JC]    Clinical Course User Index [JC] Cuthriell, Charline Bills, PA-C    Patient's diagnosis is consistent with influenza-like illness, body aches, hilar mass, history of lung cancer.  Patient presented to the emergency department with chills, nasal congestion, cough, body aches.  This is been ongoing times 1 week.  No fevers reported.  Exam was mostly reassuring.  Patient was evaluated with labs and imaging.  On x-ray, there is concern for right hilar mass not previously appreciated.  This is concerning for recurrence of patient's lung cancer.  I discussed the case with on-call oncologist, Dr. Janese [facility] who verbalizes that they will have close follow-up with the patient for further management of this condition.  Patient will be prescribed tramadol for his pain.  He is to follow-up with oncology for further management of cough, hilar mass, concern for recurrence of lung cancer..Patient is given ED precautions to return to the ED for any worsening or new symptoms.     ____________________________________________  FINAL  CLINICAL IMPRESSION(S) / ED DIAGNOSES  Final diagnoses:  Influenza-like illness  Generalized body aches  Hilar mass  Malignant neoplasm of lung, unspecified laterality, unspecified part of lung ([place])      NEW MEDICATIONS STARTED DURING THIS VISIT:  ED Discharge Orders        Ordered    traMADol (ULTRAM) 50 MG tablet  Every 6 hours PRN     08/16/17 1853          This chart was dictated using voice recognition software/Dragon. Despite best efforts to proofread, errors can occur which can change the meaning. Any change was purely unintentional.     Darletta Moll, PA-C 08/16/17 1854    Nena Polio, MD 08/16/17 [phone]

## 2017-08-16 NOTE — Telephone Encounter (Signed)
Please call reagarding CMN for pt oxygen faxed on 1/4.

## 2017-08-17 NOTE — Progress Notes (Deleted)
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  INTERVAL HISTORY: Patient returns to clinic today for routine evaluation and discussion of his imaging results. He is having significant hand and joint pain, but otherwise feels well. He reports a poor appetite appetite. He does not complain of headache. He has no neurologic complaints. He denies any fevers. He denies any chest pain, shortness of breath, cough, or hemoptysis. He denies any nausea, vomiting, constipation, or diarrhea. He has no urinary complaints. Patient offers no further specific complaints.  REVIEW OF SYSTEMS:   Review of Systems  Constitutional: Positive for weight loss. Negative for fever and malaise/fatigue.  HENT: Negative for tinnitus.   Respiratory: Negative for cough, hemoptysis and shortness of breath.   Cardiovascular: Negative.  Negative for chest pain and leg swelling.  Gastrointestinal: Negative.  Negative for abdominal pain.  Genitourinary: Negative.   Musculoskeletal: Positive for joint pain and myalgias.  Neurological: Negative.  Negative for weakness and headaches.  Psychiatric/Behavioral: Negative.  The patient is not nervous/anxious.     As per HPI. Otherwise, a complete review of systems is negative.  PAST MEDICAL HISTORY: Past Medical History:  Diagnosis Date  . Arthritis   . Asthma   . COPD (chronic obstructive pulmonary disease) (HCC)    not on home o2  . Gout   . History of kidney stones   . History of melanoma   . Hypertension   . Oxygen deficiency    O2 1 LITER [facility]  . Pain    chronic pain all over  . Small cell lung cancer (HCC)    melanoma  . Tobacco use     PAST SURGICAL HISTORY: Past Surgical History:  Procedure Laterality  Date  . ELECTROMAGNETIC NAVIGATION BROCHOSCOPY N/A 07/22/2016   Procedure: ELECTROMAGNETIC NAVIGATION BRONCHOSCOPY;  Surgeon: Flora Lipps, MD;  Location: [facility] ORS;  Service: Cardiopulmonary;  Laterality: N/A;  . PERIPHERAL VASCULAR CATHETERIZATION N/A 09/15/2015   Procedure: Glori Luis Cath Insertion;  Surgeon: Algernon Huxley, MD;  Location: [place] [facility];  Service: Cardiovascular;  Laterality: N/A;  . PORTA CATH REMOVAL N/A 05/05/2017   Procedure: Glori Luis Cath Removal;  Surgeon: Algernon Huxley, MD;  Location: [place] [facility];  Service: Cardiovascular;  Laterality: N/A;  . SKIN GRAFT     [facility]    FAMILY HISTORY: Reviewed and unchanged. No reported history of malignancy or chronic disease.     ADVANCED DIRECTIVES:    HEALTH MAINTENANCE: Social History   Tobacco Use  . Smoking status: Current Every Day Smoker    Packs/day: 0.50    Years: 51.00    Pack years: 25.50    Types: Cigarettes    Start date: 08/12/1994  . Smokeless tobacco: Never Used  . Tobacco comment: used to smoke upto 2PPD, recently cut down  Substance Use Topics  . Alcohol use: No    Alcohol/week: 0.0 oz  . Drug use: Yes    Types: Cocaine    Comment: approx. a month quit     Colonoscopy:  PAP:  Bone density:  Lipid panel:  Allergies  Allergen Reactions  . Tylenol [Acetaminophen] Other (See Comments)    Unable to take due to chemotherapy regimen  . Vicodin [Hydrocodone-Acetaminophen] Nausea And Vomiting and Rash    Current  Outpatient Medications  Medication Sig Dispense Refill  . albuterol (PROAIR HFA) 108 (90 Base) MCG/ACT inhaler Inhale 2 puffs into the lungs every 6 (six) hours as needed for wheezing or shortness of breath. 8.5 Inhaler 2  . feeding supplement, ENSURE ENLIVE, (ENSURE ENLIVE) LIQD Take 237 mLs by mouth 3 (three) times daily between meals. 237 mL 12  . gabapentin (NEURONTIN) 300 MG capsule Take 1 capsule (300 mg total) by mouth 3 (three) times daily. 90 capsule 3  . meloxicam (MOBIC) 15 MG  tablet Take 1 tablet (15 mg total) by mouth daily. 90 tablet 1  . pantoprazole (PROTONIX) 40 MG tablet Take 1 tablet (40 mg total) by mouth daily. 90 tablet 1  . tiotropium (SPIRIVA) 18 MCG inhalation capsule Place 1 capsule (18 mcg total) into inhaler and inhale daily. 30 capsule 5  . traMADol (ULTRAM) 50 MG tablet Take 1 tablet (50 mg total) by mouth every 6 (six) hours as needed. 20 tablet 0   No current facility-administered medications for this visit.     OBJECTIVE: There were no vitals filed for this visit.   There is no height or weight on file to calculate BMI.    ECOG FS:0 - Asymptomatic  General: Thin, no acute distress. Eyes: Pink conjunctiva, anicteric sclera. Lungs: Clear to auscultation bilaterally. Heart: Regular rate and rhythm. No rubs, murmurs, or gallops. Abdomen: Soft, nontender, nondistended. No organomegaly noted, normoactive bowel sounds. Musculoskeletal: No edema, cyanosis, or clubbing. Neuro: Alert, answering all questions appropriately. Cranial nerves grossly intact. Skin: No rashes or petechiae noted. Psych: Normal affect.  LAB RESULTS:  Lab Results  Component Value Date   NA 138 08/16/2017   K 3.6 08/16/2017   CL 105 08/16/2017   CO2 25 08/16/2017   GLUCOSE 123 (H) 08/16/2017   BUN 16 08/16/2017   CREATININE 0.97 08/16/2017   CALCIUM 8.7 (L) 08/16/2017   PROT 6.6 08/16/2017   ALBUMIN 3.2 (L) 08/16/2017   AST 15 08/16/2017   ALT 8 (L) 08/16/2017   ALKPHOS 56 08/16/2017   BILITOT 0.2 (L) 08/16/2017   GFRNONAA >60 08/16/2017   GFRAA >60 08/16/2017    Lab Results  Component Value Date   WBC 4.7 08/16/2017   NEUTROABS 3.0 08/16/2017   HGB 11.3 (L) 08/16/2017   HCT 33.8 (L) 08/16/2017   MCV 87.1 08/16/2017   PLT 269 08/16/2017     STUDIES: Dg Chest 2 View  Result Date: 08/16/2017 CLINICAL DATA:  Cough, congestion and body aches over the last week. Smoking history. History of small cell lung cancer. EXAM: CHEST  2 VIEW COMPARISON:   02/03/2017.  07/18/2016. FINDINGS: Heart size is normal. There is aortic atherosclerosis. Background pattern of emphysema with increased interstitial markings. Focal density in the right suprahilar region appears more prominent, measuring 18 mm. This is worrisome for recurrent tumor in this location. No effusions. No consolidation or collapse. No significant bone finding. IMPRESSION: Chronic emphysema and pulmonary scarring. No sign of acute pneumonia or collapse. Increasing focal density in the right suprahilar region compared to the previous studies, worrisome for recurrent tumor in this location. Electronically Signed   By: Nelson Chimes M.D.   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  PLAN:    1. 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 No  intervention is needed at this time. Return to clinic in 6 months with repeat imaging and further evaluation.  2. Weight loss: Improved. Patient was given a one-month supply of 4 mg Decadron daily. Patient was instructed that he will need to discontinue taper off this medication shortly. 3. History of melanoma: Patient is a poor historian but appears to have been greater than 3 years ago. Monitor. 4. Joint pain: ANA and rheumatoid factor were ordered, but patient did not stop and lab prior to leaving. Dexamethasone as above. Patient was also given a one-month supply of meloxicam until he can be evaluated by rheumatology. Patient will no longer receive narcotics from this clinic. Of note, patient also had several urinary drug screen positive for cocaine. Continue gabapentin as prescribed.   Patient expressed understanding and was in agreement with this plan. He also understands that He can call clinic at any time with any questions, concerns, or complaints.    Lloyd Huger, MD   08/17/2017 11:01 PM

## 2017-08-17 NOTE — Telephone Encounter (Signed)
Returned call to [employer] (Amy) and notified form received and awaiting MD signature.

## 2017-08-18 ENCOUNTER — Inpatient Hospital Stay: Payer: Medicaid Other | Admitting: Oncology

## 2017-08-25 ENCOUNTER — Ambulatory Visit
Admission: RE | Admit: 2017-08-25 | Discharge: 2017-08-25 | Disposition: A | Discharge status: Home / Self Care | Payer: Medicaid Other | Source: Ambulatory Visit | Attending: Oncology | Admitting: Oncology

## 2017-08-25 DIAGNOSIS — C3490 Malignant neoplasm of unspecified part of unspecified bronchus or lung: Secondary | ICD-10-CM | POA: Diagnosis present

## 2017-08-25 DIAGNOSIS — I700 Atherosclerosis of aorta: Secondary | ICD-10-CM | POA: Insufficient documentation

## 2017-08-25 DIAGNOSIS — J439 Emphysema, unspecified: Secondary | ICD-10-CM | POA: Diagnosis not present

## 2017-08-25 MED ORDER — IOPAMIDOL (ISOVUE-300) INJECTION 61%
75.0000 mL | Freq: Once | INTRAVENOUS | Status: AC | PRN
  Administered 2017-08-25: 75 mL via INTRAVENOUS

## 2017-08-28 NOTE — Progress Notes (Signed)
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  INTERVAL HISTORY: Patient was last evaluated in clinic in July 2018.  He returns to clinic today for discussion of his imaging results and further evaluation.  He continues to have significant pain secondary to gout and arthritis, but otherwise feels well. He does not complain of headache. He has no neurologic complaints. He denies any fevers. He denies any chest pain, but has chronic shortness of breath and cough. He denies any nausea, vomiting, constipation, or diarrhea. He has no urinary complaints. Patient offers no further specific complaints.  REVIEW OF SYSTEMS:   Review of Systems  Constitutional: Negative for fever, malaise/fatigue and weight loss.  HENT: Negative for tinnitus.   Respiratory: Positive for cough and shortness of breath. Negative for hemoptysis.   Cardiovascular: Negative.  Negative for chest pain and leg swelling.  Gastrointestinal: Negative.  Negative for abdominal pain.  Genitourinary: Negative.   Musculoskeletal: Positive for joint pain and myalgias.  Neurological: Negative.  Negative for weakness and headaches.  Psychiatric/Behavioral: Negative.  The patient is not nervous/anxious.     As per HPI. Otherwise, a complete review of systems is negative.  PAST MEDICAL HISTORY: Past Medical History:  Diagnosis Date  . Arthritis   . Asthma   . COPD (chronic obstructive pulmonary disease) (HCC)    not on home o2  . Gout   . History of kidney stones   . History of melanoma   . Hypertension   . Oxygen deficiency    O2 1 LITER [facility]  . Pain    chronic pain all over  . Small cell lung cancer (HCC)    melanoma  . Tobacco use     PAST SURGICAL HISTORY: Past  Surgical History:  Procedure Laterality Date  . ELECTROMAGNETIC NAVIGATION BROCHOSCOPY N/A 07/22/2016   Procedure: ELECTROMAGNETIC NAVIGATION BRONCHOSCOPY;  Surgeon: Flora Lipps, MD;  Location: [facility];  Service: Cardiopulmonary;  Laterality: N/A;  . PERIPHERAL VASCULAR CATHETERIZATION N/A 09/15/2015   Procedure: Glori Luis Cath Insertion;  Surgeon: Algernon Huxley, MD;  Location: [place] [facility];  Service: Cardiovascular;  Laterality: N/A;  . PORTA CATH REMOVAL N/A 05/05/2017   Procedure: Glori Luis Cath Removal;  Surgeon: Algernon Huxley, MD;  Location: [place] [facility];  Service: Cardiovascular;  Laterality: N/A;  . SKIN GRAFT     [facility]    FAMILY HISTORY: Reviewed and unchanged. No reported history of malignancy or chronic disease.     ADVANCED DIRECTIVES:    HEALTH MAINTENANCE: Social History   Tobacco Use  . Smoking status: Current Every Day Smoker    Packs/day: 0.50    Years: 51.00    Pack years: 25.50    Types: Cigarettes    Start date: 08/12/1994  . Smokeless tobacco: Never Used  . Tobacco comment: used to smoke upto 2PPD, recently cut down  Substance Use Topics  . Alcohol use: No    Alcohol/week: 0.0 oz  . Drug use: Yes    Types: Cocaine    Comment: approx. a month quit     Colonoscopy:  PAP:  Bone density:  Lipid panel:  Allergies  Allergen Reactions  . Tylenol [Acetaminophen] Other (See Comments)    Unable to take due to chemotherapy regimen  . Vicodin [  Hydrocodone-Acetaminophen] Nausea And Vomiting and Rash    Current Outpatient Medications  Medication Sig Dispense Refill  . albuterol (PROAIR HFA) 108 (90 Base) MCG/ACT inhaler Inhale 2 puffs into the lungs every 6 (six) hours as needed for wheezing or shortness of breath. (Patient not taking: Reported on 08/29/2017) 8.5 Inhaler 2  . feeding supplement, ENSURE ENLIVE, (ENSURE ENLIVE) LIQD Take 237 mLs by mouth 3 (three) times daily between meals. (Patient not taking: Reported on 08/29/2017) 237 mL 12  . gabapentin  (NEURONTIN) 300 MG capsule Take 1 capsule (300 mg total) by mouth 3 (three) times daily. (Patient not taking: Reported on 08/29/2017) 90 capsule 3  . meloxicam (MOBIC) 15 MG tablet Take 1 tablet (15 mg total) by mouth daily. (Patient not taking: Reported on 08/29/2017) 90 tablet 1  . pantoprazole (PROTONIX) 40 MG tablet Take 1 tablet (40 mg total) by mouth daily. (Patient not taking: Reported on 08/29/2017) 90 tablet 1  . tiotropium (SPIRIVA) 18 MCG inhalation capsule Place 1 capsule (18 mcg total) into inhaler and inhale daily. (Patient not taking: Reported on 08/29/2017) 30 capsule 5  . traMADol (ULTRAM) 50 MG tablet Take 1 tablet (50 mg total) by mouth every 6 (six) hours as needed. (Patient not taking: Reported on 08/29/2017) 20 tablet 0   No current facility-administered medications for this visit.     OBJECTIVE: Vitals:   08/29/17 1525  BP: 114/75  Pulse: 91  Resp: 18  Temp: 98.3 F (36.8 C)     Body mass index is 19.27 kg/m.    ECOG FS:0 - Asymptomatic  General: Thin, no acute distress. Eyes: Pink conjunctiva, anicteric sclera. Lungs: Clear to auscultation bilaterally. Heart: Regular rate and rhythm. No rubs, murmurs, or gallops. Abdomen: Soft, nontender, nondistended. No organomegaly noted, normoactive bowel sounds. Musculoskeletal: No edema, cyanosis, or clubbing. Neuro: Alert, answering all questions appropriately. Cranial nerves grossly intact. Skin: No rashes or petechiae noted. Psych: Normal affect.  LAB RESULTS:  Lab Results  Component Value Date   NA 138 08/16/2017   K 3.6 08/16/2017   CL 105 08/16/2017   CO2 25 08/16/2017   GLUCOSE 123 (H) 08/16/2017   BUN 16 08/16/2017   CREATININE 0.97 08/16/2017   CALCIUM 8.7 (L) 08/16/2017   PROT 6.6 08/16/2017   ALBUMIN 3.2 (L) 08/16/2017   AST 15 08/16/2017   ALT 8 (L) 08/16/2017   ALKPHOS 56 08/16/2017   BILITOT 0.2 (L) 08/16/2017   GFRNONAA >60 08/16/2017   GFRAA >60 08/16/2017    Lab Results  Component Value  Date   WBC 4.7 08/16/2017   NEUTROABS 3.0 08/16/2017   HGB 11.3 (L) 08/16/2017   HCT 33.8 (L) 08/16/2017   MCV 87.1 08/16/2017   PLT 269 08/16/2017     STUDIES: Dg Chest 2 View  Result Date: 08/16/2017 CLINICAL DATA:  Cough, congestion and body aches over the last week. Smoking history. History of small cell lung cancer. EXAM: CHEST  2 VIEW COMPARISON:  02/03/2017.  07/18/2016. FINDINGS: Heart size is normal. There is aortic atherosclerosis. Background pattern of emphysema with increased interstitial markings. Focal density in the right suprahilar region appears more prominent, measuring 18 mm. This is worrisome for recurrent tumor in this location. No effusions. No consolidation or collapse. No significant bone finding. IMPRESSION: Chronic emphysema and pulmonary scarring. No sign of acute pneumonia or collapse. Increasing focal density in the right suprahilar region compared to the previous studies, worrisome for recurrent tumor in this location. Electronically Signed   By: Elta Guadeloupe  Shogry M.D.   On: 08/16/2017 17:48   Ct Chest W Contrast  Result Date: 08/25/2017 CLINICAL DATA:  Small-cell lung cancer restaging EXAM: CT CHEST WITH CONTRAST TECHNIQUE: Multidetector CT imaging of the chest was performed during intravenous contrast administration. CONTRAST:  65mL ISOVUE-300 IOPAMIDOL (ISOVUE-300) INJECTION 61% COMPARISON:  02/04/2017 FINDINGS: Cardiovascular: The heart size is normal. No pericardial effusion. Coronary artery calcification is evident. Atherosclerotic calcification is noted in the wall of the thoracic aorta. Mediastinum/Nodes: No mediastinal lymphadenopathy. Abnormal soft tissue in the right hilum measured on the prior study at 2.4 x 1.7 cm measures 3.4 x 1.5 cm today. Similar appearance of a 14 x 11 mm soft tissue lesion in the inferior right hilum on image 87 series 6. Soft tissue thickening associated with the inferior left hilum is stable. The esophagus has normal imaging features.  Tiny distal paraesophageal lymph nodes are similar to prior. Scattered tiny lymph nodes are seen in each axilla without lymphadenopathy. Lungs/Pleura: Centrilobular emphysema as before. The spiculated lesion in the right mid lung is similar. Dominant nodular component measures 16 x 13 mm today compared to 17 x 13 mm previously. 8 mm nodule along the medial pleura of the right lung on image 67 is stable. 8 mm perifissural nodule seen in the parahilar right lung on image 88 series 8 today was 6 mm previously. Other scattered tiny perifissural nodules are evident bilaterally, similar to prior. Bilateral small subpleural nodules are not substantially changed. The patient does have a new 6 mm a right posterior subpleural nodule on image 105 of series 8. Stable, scattered bilateral parenchymal nodules are evident, some of which are calcified. No pulmonary edema or pleural effusion Upper Abdomen: Unremarkable. Musculoskeletal: T7 superior endplate compression fracture is new in the interval resulting in 25% or loss of vertebral body height anteriorly. IMPRESSION: 1. New 6 mm posterior right lower subpleural nodule. Attention on follow-up recommended. 2. Otherwise No clear progression or regression of disease. 3. Dominant right spiculated pulmonary lesion is similar to prior. Numerous bilateral perifissural, subpleural, and parenchymal nodules persist without appreciable change. 4. Abnormal soft tissue in each hilum, similar to prior. 5.  Emphysema. (ICD10-J43.9) 6.  Aortic Atherosclerois (ICD10-170.0) Electronically Signed   By: Misty Stanley M.D.   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  PLAN:    1. 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  He did have a new 6 mm right lower lobe nodule that is too small to characterize, but  will monitor closely.  Patient completed his chemotherapy on Dec 22, 2015.  No intervention is needed at this time. Return to clinic in 6 months with repeat imaging and further evaluation.  2. Weight loss: Patient's weight has ranged from 130 pounds to 142 pounds over the past year.  Monitor. 3. History of melanoma: Patient is a poor historian but appears to have been greater than 5 years ago. Monitor. 4. Joint pain: Continue monitoring and treatment per primary care.  Approximately 20 minutes was spent in discussion of which greater than 50% was consultation.   Patient expressed understanding and was in agreement with this plan. He also understands that He can call clinic at any time with any questions, concerns, or complaints.   Lloyd Huger, MD   08/29/2017 4:45 PM

## 2017-08-29 ENCOUNTER — Inpatient Hospital Stay (HOSPITAL_BASED_OUTPATIENT_CLINIC_OR_DEPARTMENT_OTHER): Payer: Medicaid Other | Admitting: Oncology

## 2017-08-29 VITALS — BP 114/75 | HR 91 | Temp 98.3°F | Resp 18 | Wt 130.5 lb

## 2017-08-29 DIAGNOSIS — Z9221 Personal history of antineoplastic chemotherapy: Secondary | ICD-10-CM | POA: Diagnosis not present

## 2017-08-29 DIAGNOSIS — C3490 Malignant neoplasm of unspecified part of unspecified bronchus or lung: Secondary | ICD-10-CM

## 2017-08-29 DIAGNOSIS — M2550 Pain in unspecified joint: Secondary | ICD-10-CM

## 2017-08-29 DIAGNOSIS — R911 Solitary pulmonary nodule: Secondary | ICD-10-CM

## 2017-08-29 DIAGNOSIS — Z85820 Personal history of malignant melanoma of skin: Secondary | ICD-10-CM

## 2017-08-29 DIAGNOSIS — C3491 Malignant neoplasm of unspecified part of right bronchus or lung: Secondary | ICD-10-CM

## 2017-10-19 ENCOUNTER — Emergency Department: Payer: Medicaid Other

## 2017-10-19 ENCOUNTER — Other Ambulatory Visit: Payer: Self-pay

## 2017-10-19 ENCOUNTER — Encounter: Payer: Self-pay | Admitting: Emergency Medicine

## 2017-10-19 ENCOUNTER — Emergency Department
Admission: EM | Admit: 2017-10-19 | Discharge: 2017-10-19 | Disposition: A | Discharge status: Home / Self Care | Payer: Medicaid Other | Attending: Student in an Organized Health Care Education/Training Program | Admitting: Student in an Organized Health Care Education/Training Program

## 2017-10-19 DIAGNOSIS — J45909 Unspecified asthma, uncomplicated: Secondary | ICD-10-CM | POA: Diagnosis not present

## 2017-10-19 DIAGNOSIS — J449 Chronic obstructive pulmonary disease, unspecified: Secondary | ICD-10-CM | POA: Insufficient documentation

## 2017-10-19 DIAGNOSIS — I10 Essential (primary) hypertension: Secondary | ICD-10-CM | POA: Insufficient documentation

## 2017-10-19 DIAGNOSIS — Z85118 Personal history of other malignant neoplasm of bronchus and lung: Secondary | ICD-10-CM | POA: Insufficient documentation

## 2017-10-19 DIAGNOSIS — I2699 Other pulmonary embolism without acute cor pulmonale: Secondary | ICD-10-CM | POA: Insufficient documentation

## 2017-10-19 DIAGNOSIS — F17210 Nicotine dependence, cigarettes, uncomplicated: Secondary | ICD-10-CM | POA: Diagnosis not present

## 2017-10-19 DIAGNOSIS — R071 Chest pain on breathing: Secondary | ICD-10-CM | POA: Diagnosis present

## 2017-10-19 DIAGNOSIS — R0789 Other chest pain: Secondary | ICD-10-CM

## 2017-10-19 DIAGNOSIS — Z79899 Other long term (current) drug therapy: Secondary | ICD-10-CM | POA: Insufficient documentation

## 2017-10-19 LAB — CBC WITH DIFFERENTIAL/PLATELET
BASOS PCT: 1 %
Basophils Absolute: 0 10*3/uL (ref 0–0.1)
EOS ABS: 0 10*3/uL (ref 0–0.7)
Eosinophils Relative: 0 %
HCT: 38.6 % — ABNORMAL LOW (ref 40.0–52.0)
Hemoglobin: 12.9 g/dL — ABNORMAL LOW (ref 13.0–18.0)
LYMPHS ABS: 0.7 10*3/uL — AB (ref 1.0–3.6)
Lymphocytes Relative: 13 %
MCH: 28.7 pg (ref 26.0–34.0)
MCHC: 33.3 g/dL (ref 32.0–36.0)
MCV: 86.1 fL (ref 80.0–100.0)
MONO ABS: 0.6 10*3/uL (ref 0.2–1.0)
MONOS PCT: 11 %
Neutro Abs: 4.2 10*3/uL (ref 1.4–6.5)
Neutrophils Relative %: 75 %
Platelets: 217 10*3/uL (ref 150–440)
RBC: 4.48 MIL/uL (ref 4.40–5.90)
RDW: 15.1 % — AB (ref 11.5–14.5)
WBC: 5.5 10*3/uL (ref 3.8–10.6)

## 2017-10-19 LAB — COMPREHENSIVE METABOLIC PANEL
ALBUMIN: 3.8 g/dL (ref 3.5–5.0)
ALT: 9 U/L — ABNORMAL LOW (ref 17–63)
ANION GAP: 14 (ref 5–15)
AST: 16 U/L (ref 15–41)
Alkaline Phosphatase: 68 U/L (ref 38–126)
BILIRUBIN TOTAL: 0.5 mg/dL (ref 0.3–1.2)
BUN: 16 mg/dL (ref 6–20)
CALCIUM: 8.6 mg/dL — AB (ref 8.9–10.3)
CO2: 23 mmol/L (ref 22–32)
Chloride: 100 mmol/L — ABNORMAL LOW (ref 101–111)
Creatinine, Ser: 1.03 mg/dL (ref 0.61–1.24)
GFR calc non Af Amer: >60 mL/min (ref >60)
Glucose, Bld: 101 mg/dL — ABNORMAL HIGH (ref 65–99)
POTASSIUM: 3.7 mmol/L (ref 3.5–5.1)
SODIUM: 137 mmol/L (ref 135–145)
TOTAL PROTEIN: 7.4 g/dL (ref 6.5–8.1)

## 2017-10-19 LAB — TROPONIN I: Troponin I: <0.03 ng/mL (ref <0.03)

## 2017-10-19 MED ORDER — APIXABAN 5 MG PO TABS: 5.0000 mg | ORAL_TABLET | Freq: Two times a day (BID) | ORAL | 0 refills | Status: DC

## 2017-10-19 MED ORDER — APIXABAN 5 MG PO TABS
10.0000 mg | ORAL_TABLET | Freq: Two times a day (BID) | ORAL | Status: DC
  Administered 2017-10-19: 10 mg via ORAL
  Filled 2017-10-19 (×2): qty 2

## 2017-10-19 MED ORDER — LIDOCAINE 5 % EX PTCH
1.0000 | MEDICATED_PATCH | CUTANEOUS | Status: DC
  Administered 2017-10-19: 1 via TRANSDERMAL
  Filled 2017-10-19: qty 1

## 2017-10-19 MED ORDER — OXYCODONE HCL 5 MG PO TABS
5.0000 mg | ORAL_TABLET | ORAL | Status: DC | PRN
  Administered 2017-10-19: 5 mg via ORAL
  Filled 2017-10-19: qty 1

## 2017-10-19 MED ORDER — IOPAMIDOL (ISOVUE-370) INJECTION 76%
75.0000 mL | Freq: Once | INTRAVENOUS | Status: AC | PRN
  Administered 2017-10-19: 75 mL via INTRAVENOUS
  Filled 2017-10-19: qty 75

## 2017-10-19 MED ORDER — LIDOCAINE 5 % EX PTCH
1.0000 | MEDICATED_PATCH | CUTANEOUS | Status: DC
  Administered 2017-10-19: 1 via TRANSDERMAL
  Filled 2017-10-19 (×2): qty 1

## 2017-10-19 NOTE — ED Notes (Signed)

## 2017-10-19 NOTE — ED Triage Notes (Signed)
Pt reports that he has been having lower right rib pain for the last 2 weeks. Denies any injury. Reports that every time he lays down it feels like something is sticking him. Denies any SOB or trouble breathing.

## 2017-10-19 NOTE — ED Provider Notes (Signed)
[facility] Emergency Department Provider Note    None    (approximate)  I have reviewed the triage vital signs and the nursing notes.   HISTORY  Chief Complaint Chest Pain    HPI David [name] is a 62 y.o. male is a 2 weeks of right lower rib pain associated with some shortness of breath more pain when taking deep inspiration.  Patient does have a history of small cell lung cancer.  Does not remember any trauma.  Says the pain is worse with laying flat and taking deep breaths.  No history of DVT that he is aware of.  Not on any blood thinners.  No worsening with exertion.  No fevers or productive cough.   Past Medical History:  Diagnosis Date  . Arthritis   . Asthma   . COPD (chronic obstructive pulmonary disease) (HCC)    not on home o2  . Gout   . History of kidney stones   . History of melanoma   . Hypertension   . Oxygen deficiency    O2 1 LITER [place] [facility]  . Pain    chronic pain all over  . Small cell lung cancer (HCC)    melanoma  . Tobacco use    Family History  Problem Relation Age of Onset  . Lung cancer Mother   . Heart attack Mother 44  . Cirrhosis Father 66   Past Surgical History:  Procedure Laterality Date  . ELECTROMAGNETIC NAVIGATION BROCHOSCOPY N/A 07/22/2016   Procedure: ELECTROMAGNETIC NAVIGATION BRONCHOSCOPY;  Surgeon: Flora Lipps, MD;  Location: [facility] ORS;  Service: Cardiopulmonary;  Laterality: N/A;  . PERIPHERAL VASCULAR CATHETERIZATION N/A 09/15/2015   Procedure: Glori Luis Cath Insertion;  Surgeon: Algernon Huxley, MD;  Location: [place] [facility];  Service: Cardiovascular;  Laterality: N/A;  . PORTA CATH REMOVAL N/A 05/05/2017   Procedure: Glori Luis Cath Removal;  Surgeon: Algernon Huxley, MD;  Location: [place] [facility];  Service: Cardiovascular;  Laterality: N/A;  . SKIN GRAFT     [facility]   Patient Active Problem List   Diagnosis Date Noted  . Hypertension 04/26/2017  . Chronic pain syndrome 09/28/2016  . Long term current  use of opiate analgesic 09/28/2016  . Long term prescription opiate use 09/28/2016  . Disturbance of skin sensation 09/28/2016  . Lung mass   . Anemia 07/19/2016  . Multiple lung nodules 07/19/2016  . Chest pain 07/18/2016  . Primary osteoarthritis involving multiple joints 06/23/2016  . Chronic bursitis of left shoulder 06/23/2016  . Chronic pain of both shoulders 06/23/2016  . Polysubstance abuse ([place]) 06/23/2016  . Tobacco abuse 04/20/2016  . History of melanoma 04/20/2016  . COPD (chronic obstructive pulmonary disease) ([place]) 04/20/2016  . Gout 04/20/2016  . Adjustment disorder 04/20/2016  . Insomnia 04/20/2016  . Chronic pain due to neoplasm 04/20/2016  . Opiate use 04/20/2016  . Small cell lung cancer ([place]) 09/08/2015      Prior to Admission medications   Medication Sig Start Date End Date Taking? Authorizing Provider  albuterol (PROAIR HFA) 108 (90 Base) MCG/ACT inhaler Inhale 2 puffs into the lungs every 6 (six) hours as needed for wheezing or shortness of breath. Patient not taking: Reported on 08/29/2017 05/02/17   Olin Hauser, DO  feeding supplement, ENSURE ENLIVE, (ENSURE ENLIVE) LIQD Take 237 mLs by mouth 3 (three) times daily between meals. Patient not taking: Reported on 08/29/2017 07/19/16   Theodoro Grist, MD  gabapentin (NEURONTIN) 300 MG capsule  Take 1 capsule (300 mg total) by mouth 3 (three) times daily. Patient not taking: Reported on 08/29/2017 07/12/17   Olin Hauser, DO  meloxicam (MOBIC) 15 MG tablet Take 1 tablet (15 mg total) by mouth daily. Patient not taking: Reported on 08/29/2017 07/12/17   Olin Hauser, DO  pantoprazole (PROTONIX) 40 MG tablet Take 1 tablet (40 mg total) by mouth daily. Patient not taking: Reported on 08/29/2017 05/02/17   Olin Hauser, DO  tiotropium (SPIRIVA) 18 MCG inhalation capsule Place 1 capsule (18 mcg total) into inhaler and inhale daily. Patient not taking: Reported on 08/29/2017  05/02/17   Olin Hauser, DO  traMADol (ULTRAM) 50 MG tablet Take 1 tablet (50 mg total) by mouth every 6 (six) hours as needed. Patient not taking: Reported on 08/29/2017 08/16/17   Cuthriell, Charline Bills, PA-C    Allergies Tylenol [acetaminophen] and Vicodin [hydrocodone-acetaminophen]    Social History Social History   Tobacco Use  . Smoking status: Current Every Day Smoker    Packs/day: 0.50    Years: 51.00    Pack years: 25.50    Types: Cigarettes    Start date: 08/12/1994  . Smokeless tobacco: Never Used  . Tobacco comment: used to smoke upto 2PPD, recently cut down  Substance Use Topics  . Alcohol use: No    Alcohol/week: 0.0 oz  . Drug use: No    Comment: approx. a month quit    Review of Systems Patient denies headaches, rhinorrhea, blurry vision, numbness, shortness of breath, chest pain, edema, cough, abdominal pain, nausea, vomiting, diarrhea, dysuria, fevers, rashes or hallucinations unless otherwise stated above in HPI. ____________________________________________   PHYSICAL EXAM:  VITAL SIGNS: Vitals:   10/19/17 1427  BP: 111/70  Pulse: 100  Resp: 20  Temp: 97.8 F (36.6 C)  SpO2: 96%    Constitutional: Alert and oriented. Well appearing and in no acute distress. Eyes: Conjunctivae are normal.  Head: Atraumatic. Nose: No congestion/rhinnorhea. Mouth/Throat: Mucous membranes are moist.   Neck: No stridor. Painless ROM.  Cardiovascular: Normal rate, regular rhythm. Grossly normal heart sounds.  Good peripheral circulation. Respiratory: Normal respiratory effort.  No retractions. Lungs CTAB. Gastrointestinal: Soft and nontender. No distention. No abdominal bruits. No CVA tenderness. Genitourinary:  Musculoskeletal: Pain is reproduced with palpation of the right inferior ribs, no contusion or masses appreciated.  No lower extremity tenderness nor edema.  No joint effusions. Neurologic:  Normal speech and language. No gross focal neurologic  deficits are appreciated. No facial droop Skin:  Skin is warm, dry and intact. No rash noted. Psychiatric: Mood and affect are normal. Speech and behavior are normal.  ____________________________________________   LABS (all labs ordered are listed, but only abnormal results are displayed)  Results for orders placed or performed during the hospital encounter of 10/19/17 (from the past 24 hour(s))  CBC with Differential/Platelet     Status: Abnormal   Collection Time: 10/19/17  4:14 PM  Result Value Ref Range   WBC 5.5 3.8 - 10.6 K/uL   RBC 4.48 4.40 - 5.90 MIL/uL   Hemoglobin 12.9 (L) 13.0 - 18.0 g/dL   HCT 38.6 (L) 40.0 - 52.0 %   MCV 86.1 80.0 - 100.0 fL   MCH 28.7 26.0 - 34.0 pg   MCHC 33.3 32.0 - 36.0 g/dL   RDW 15.1 (H) 11.5 - 14.5 %   Platelets 217 150 - 440 K/uL   Neutrophils Relative % 75 %   Neutro Abs 4.2 1.4 - 6.5  K/uL   Lymphocytes Relative 13 %   Lymphs Abs 0.7 (L) 1.0 - 3.6 K/uL   Monocytes Relative 11 %   Monocytes Absolute 0.6 0.2 - 1.0 K/uL   Eosinophils Relative 0 %   Eosinophils Absolute 0.0 0 - 0.7 K/uL   Basophils Relative 1 %   Basophils Absolute 0.0 0 - 0.1 K/uL  Comprehensive metabolic panel     Status: Abnormal   Collection Time: 10/19/17  4:14 PM  Result Value Ref Range   Sodium 137 135 - 145 mmol/L   Potassium 3.7 3.5 - 5.1 mmol/L   Chloride 100 (L) 101 - 111 mmol/L   CO2 23 22 - 32 mmol/L   Glucose, Bld 101 (H) 65 - 99 mg/dL   BUN 16 6 - 20 mg/dL   Creatinine, Ser 1.03 0.61 - 1.24 mg/dL   Calcium 8.6 (L) 8.9 - 10.3 mg/dL   Total Protein 7.4 6.5 - 8.1 g/dL   Albumin 3.8 3.5 - 5.0 g/dL   AST 16 15 - 41 U/L   ALT 9 (L) 17 - 63 U/L   Alkaline Phosphatase 68 38 - 126 U/L   Total Bilirubin 0.5 0.3 - 1.2 mg/dL   GFR calc non Af Amer >60 >60 mL/min   GFR calc Af Amer >60 >60 mL/min   Anion gap 14 5 - 15  Troponin I     Status: None   Collection Time: 10/19/17  4:14 PM  Result Value Ref Range   Troponin I <0.03 <0.03 ng/mL    ____________________________________________  EKG My review and personal interpretation at Time: 16:24   Indication: chest pain  Rate: 90  Rhythm: sinus Axis: normal Other: normal intervals, no stemi ____________________________________________  RADIOLOGY  I personally reviewed all radiographic images ordered to evaluate for the above acute complaints and reviewed radiology reports and findings.  These findings were personally discussed with the patient.  Please see medical record for radiology report.  ____________________________________________   PROCEDURES  Procedure(s) performed:  Procedures    Critical Care performed: no ____________________________________________   INITIAL IMPRESSION / ASSESSMENT AND PLAN / ED COURSE  Pertinent labs & imaging results that were available during my care of the patient were reviewed by me and considered in my medical decision making (see chart for details).  DDX: ACS, pericarditis, esophagitis, boerhaaves, pe, dissection, pna, bronchitis, costochondritis   LYNDEL SARATE is a 62 y.o. who presents to the ED with anterior chest wall pain as described above.  CT angiogram ordered to further evaluate for pulmonary embolism as the patient is at higher risk given his oncological history.  Do not identify any evidence or clinical signs or symptoms to suggest ACS particular given 2 weeks of pain with a negative EKG and troponin.  Chest x-ray is unremarkable.  No evidence of fracture.  CT angiogram does show evidence of subsegmental PE without evidence of right heart strain.  Do believe patient stable and appropriate for outpatient management will be started on Eliquis with first dose here.  Discussed signs and symptoms for which he should return to the ER.      ____________________________________________   FINAL CLINICAL IMPRESSION(S) / ED DIAGNOSES  Final diagnoses:  Other acute pulmonary embolism without acute cor pulmonale (HCC)   Chest wall pain      NEW MEDICATIONS STARTED DURING THIS VISIT:  New Prescriptions   No medications on file     Note:  This document was prepared using Dragon voice recognition software and may  include unintentional dictation errors.    Merlyn Lot, MD 10/19/17 [phone]

## 2017-10-27 ENCOUNTER — Ambulatory Visit (INDEPENDENT_AMBULATORY_CARE_PROVIDER_SITE_OTHER): Payer: Medicaid Other | Admitting: Family Medicine

## 2017-10-27 ENCOUNTER — Encounter: Payer: Self-pay | Admitting: Family Medicine

## 2017-10-27 VITALS — BP 119/72 | HR 72 | Temp 98.4°F | Resp 16 | Ht 69.0 in | Wt 124.0 lb

## 2017-10-27 DIAGNOSIS — R630 Anorexia: Secondary | ICD-10-CM

## 2017-10-27 DIAGNOSIS — J449 Chronic obstructive pulmonary disease, unspecified: Secondary | ICD-10-CM | POA: Diagnosis not present

## 2017-10-27 DIAGNOSIS — E43 Unspecified severe protein-calorie malnutrition: Secondary | ICD-10-CM

## 2017-10-27 DIAGNOSIS — Z87898 Personal history of other specified conditions: Secondary | ICD-10-CM | POA: Diagnosis not present

## 2017-10-27 DIAGNOSIS — R634 Abnormal weight loss: Secondary | ICD-10-CM | POA: Diagnosis not present

## 2017-10-27 DIAGNOSIS — C3490 Malignant neoplasm of unspecified part of unspecified bronchus or lung: Secondary | ICD-10-CM

## 2017-10-27 DIAGNOSIS — K219 Gastro-esophageal reflux disease without esophagitis: Secondary | ICD-10-CM

## 2017-10-27 DIAGNOSIS — F1911 Other psychoactive substance abuse, in remission: Secondary | ICD-10-CM

## 2017-10-27 MED ORDER — IPRATROPIUM BROMIDE 0.06 % NA SOLN: 2.0000 | Freq: Four times a day (QID) | NASAL | 0 refills | Status: DC

## 2017-10-27 MED ORDER — PANTOPRAZOLE SODIUM 40 MG PO TBEC: 40.0000 mg | DELAYED_RELEASE_TABLET | Freq: Every day | ORAL | 1 refills | Status: DC

## 2017-10-27 MED ORDER — PREDNISONE 10 MG PO TABS: ORAL_TABLET | ORAL | 0 refills | Status: DC

## 2017-10-27 NOTE — Progress Notes (Signed)
Subjective:    Patient ID: David [name], male    DOB: 1955-12-31, 62 y.o.   MRN: [id number]  David [name] is a 61 y.o. 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 He has had worsening weight loss poor appetite down 6 lbs in 6 weeks or more. Tolerating food but only eating smaller amounts. Not taking nutrition supplement as recommended or prescribed before. Ran out of Protonix, this was helping him eat, requesting refill. - Denies any heartburn, nausea vomiting, abdominal pain  Additional updates - Recently diagnosed with Subsegmental PE in ED 10/2017, started on Eliquis  Trying to quit smoking, he would like to try OTC NRT patches   Depression screen [facility] 2/9 09/07/2016 04/20/2016 12/11/2015  Decreased Interest 0 1 0  Down, Depressed, Hopeless 0 1 0  PHQ - 2 Score 0 2 0  Altered sleeping - 3 -  Tired, decreased energy - 3 -  Change in appetite - 0 -  Feeling bad or failure about yourself  - 0 -  Trouble concentrating - 0 -  Moving slowly or fidgety/restless - 0 -  Suicidal thoughts - 0 -  PHQ-9 Score - 8 -    Social History   Tobacco Use  . Smoking status: Current Every Day Smoker    Packs/day: 0.50    Years: 51.00    Pack years: 25.50   Types: Cigarettes    Start date: 08/12/1994  . Smokeless tobacco: Never Used  . Tobacco comment: used to smoke upto 2PPD, recently cut down  Substance Use Topics  . Alcohol use: No    Alcohol/week: 0.0 oz  . Drug use: No    Comment: approx. a month quit    Review of Systems Per HPI unless specifically indicated above     Objective:    BP 119/72   Pulse 72   Temp 98.4 F (36.9 C) (Oral)   Resp 16   Ht 5\' 9"  (1.753 m)   Wt 124 lb (56.2 kg)   SpO2 99%   BMI 18.31 kg/m   Wt Readings from Last 3 Encounters:  10/27/17 124 lb (56.2 kg)  10/19/17 130 lb (59 kg)  08/29/17 130 lb 8 oz (59.2 kg)    Physical Exam  Constitutional: He is oriented to person, place, and time. He appears well-developed and well-nourished. No distress.  Chronically ill-appearing thin and frail 62 year old male appears older than stated age, comfortable, cooperative   HENT:  Head: Normocephalic and atraumatic.  Mouth/Throat: Oropharynx is clear and moist.  Eyes: Conjunctivae are normal. Right eye exhibits no discharge. Left eye exhibits no discharge.  Cardiovascular: Normal rate, regular rhythm, normal heart sounds and intact distal pulses.  No murmur heard. Pulmonary/Chest: Effort normal. No  respiratory distress. He has no rales.  Moderate diffuse reduced air movement bilaterally, without increased work of breathing, occasional coughing, some scattered wheezes, without focal coarse breath sounds or crackles  Abdominal: Soft. Bowel sounds are normal. He exhibits no distension and no mass. There is no tenderness.  Musculoskeletal: He exhibits no edema.  Neurological: He is alert and oriented to person, place, and time.  Skin: Skin is warm and dry. No rash noted. He is not diaphoretic. No erythema.  Psychiatric: His behavior is normal.  Well groomed, good eye contact, normal speech and thoughts  Nursing note and vitals reviewed.  Results for orders placed or performed during the hospital encounter of  10/19/17  CBC with Differential/Platelet  Result Value Ref Range   WBC 5.5 3.8 - 10.6 K/uL   RBC 4.48 4.40 - 5.90 MIL/uL   Hemoglobin 12.9 (L) 13.0 - 18.0 g/dL   HCT 38.6 (L) 40.0 - 52.0 %   MCV 86.1 80.0 - 100.0 fL   MCH 28.7 26.0 - 34.0 pg   MCHC 33.3 32.0 - 36.0 g/dL   RDW 15.1 (H) 11.5 - 14.5 %   Platelets 217 150 - 440 K/uL   Neutrophils Relative % 75 %   Neutro Abs 4.2 1.4 - 6.5 K/uL   Lymphocytes Relative 13 %   Lymphs Abs 0.7 (L) 1.0 - 3.6 K/uL   Monocytes Relative 11 %   Monocytes Absolute 0.6 0.2 - 1.0 K/uL   Eosinophils Relative 0 %   Eosinophils Absolute 0.0 0 - 0.7 K/uL   Basophils Relative 1 %   Basophils Absolute 0.0 0 - 0.1 K/uL  Comprehensive metabolic panel  Result Value Ref Range   Sodium 137 135 - 145 mmol/L   Potassium 3.7 3.5 - 5.1 mmol/L   Chloride 100 (L) 101 - 111 mmol/L   CO2 23 22 - 32 mmol/L   Glucose, Bld 101 (H) 65 - 99 mg/dL   BUN 16 6 - 20 mg/dL   Creatinine, Ser 1.03 0.61 - 1.24 mg/dL   Calcium 8.6 (L) 8.9 - 10.3 mg/dL   Total Protein 7.4 6.5 - 8.1 g/dL   Albumin 3.8 3.5 - 5.0 g/dL   AST 16 15 - 41 U/L   ALT 9 (L) 17 - 63 U/L   Alkaline Phosphatase 68 38 - 126 U/L   Total Bilirubin 0.5 0.3 - 1.2 mg/dL   GFR calc non Af Amer >60 >60 mL/min   GFR calc Af Amer >60 >60 mL/min   Anion gap 14 5 - 15  Troponin I  Result Value Ref Range   Troponin I <0.03 <0.03 ng/mL      Assessment & Plan:   Problem List Items Addressed This Visit    COPD (chronic obstructive pulmonary disease) ([place]) - Primary    Consistent with mild acute exacerbation of COPD with worsening productive cough and dyspnea. Similar to prior exacerbations. - No hypoxia (99% on RA), afebrile, no recent hospitalization - Continues Albuterol, Spiriva  Plan: 1. Start Prednisone 50mg  x 5 day steroid burst 2. Use albuterol q 4 hr regularly x 2-3 days. Continue maintenance inhalers 3. Considered antibiotics, however afebrile and no hypoxia, would not be indicated in mild AECOPD,  unless recurrent or not improved on steroids 4. Follow-up about 1 week if not improving, otherwise strict return criteria to go to ED  Advised him to check with Pulmonology regarding home oxygen issues, and make sure he schedules appropriate f/u with them as expected  Relevant Medications   ipratropium (ATROVENT) 0.06 % nasal spray   predniSONE (DELTASONE) 10 MG tablet   History of substance abuse    Stable, still smoking, may try OTC NRT No other substances      Severe protein-calorie malnutrition (HCC)    Concern with wt loss down 6 lbs in 2 months, overall decline wt trend Poor appetite in setting of lung cancer BMI 18.31 Refill Protonix had helped him tolerate meals better before Also trial on Prednisone for AECOPD, may help improve appetite Consider future mirtazapine in future for wt gain      Small cell lung cancer ([place])    Suspect may be contributing to weight loss S/p chemotherapy Followed by [facility] CC Oncology, Pulmonology Recent eval by Onc without progression, has nodules      Relevant Medications   predniSONE (DELTASONE) 10 MG tablet    Other Visit Diagnoses    Gastroesophageal reflux disease, esophagitis presence not specified       Relevant Medications   pantoprazole (PROTONIX) 40 MG tablet   Decreased appetite       Relevant Medications   pantoprazole (PROTONIX) 40 MG tablet   Abnormal weight loss          Meds ordered this encounter  Medications  . ipratropium (ATROVENT) 0.06 % nasal spray    Sig: Place 2 sprays into both nostrils 4 (four) times daily. For up to 5-7 days then stop.    Dispense:  15 mL    Refill:  0  . pantoprazole (PROTONIX) 40 MG tablet    Sig: Take 1 tablet (40 mg total) by mouth daily.    Dispense:  90 tablet    Refill:  1  . predniSONE (DELTASONE) 10 MG tablet    Sig: Take 6 tabs with breakfast Day 1, 5 tabs Day 2, 4 tabs Day 3, 3 tabs Day 4, 2 tabs Day 5, 1 tab Day 6.    Dispense:  21 tablet    Refill:  0    Follow up  plan: Return if symptoms worsen or fail to improve, for Low Weight, reduced appetite.   Nobie Putnam, [facility] 10/27/2017, 10:12 AM

## 2017-10-27 NOTE — Assessment & Plan Note (Addendum)
Suspect may be contributing to weight loss S/p chemotherapy Followed by [facility] Oncology, Pulmonology Recent eval by Onc without progression, has nodules

## 2017-10-27 NOTE — Patient Instructions (Addendum)
Thank you for coming to the office today.  1. If problem with oxygen - call [facility] office to check see if they can help, otherwise follow-up with them as scheduled.  Phone: [phone]   Restarted Protonix this will help with stomach acid and appetite  Prednisone will help gain weight  It sounds like you had an Upper Respiratory Virus that has settled into a Bronchitis, lower respiratory tract infection. I don't have concerns for pneumonia today, and think that this should gradually improve. Once you are feeling better, the cough may take a few weeks to fully resolve. I do hear wheezing and coarse breath sounds, this may be due to the virus, also could be related to smoking. - Start Prednisone 50mg  daily for next 5 days - this will open up lungs allow you to breath better and treat that wheezing or bronchospasm - Use Albuterol inhaler 2 puffs every 4-6 hours around the clock for next 2-3 days, max up to 5 days then use as needed   Start Atrovent nasal spray decongestant 2 sprays in each nostril up to 4 times daily for 7 days  - Start OTC Mucinex for 1 week or less, to help clear the mucus  - Use nasal saline (Simply Saline or Ocean Spray) to flush nasal congestion multiple times a day, may help cough - Drink plenty of fluids to improve congestion  If your symptoms seem to worsen instead of improve over next several days, including significant fever / chills, worsening shortness of breath, worsening wheezing, or nausea / vomiting and can't take medicines - return sooner or go to hospital Emergency Department for more immediate treatment.    Please schedule a Follow-up Appointment to: Return if symptoms worsen or fail to improve, for Low Weight, reduced appetite.  If you have any other questions or concerns, please feel free to call the office or send a message through [place]. You may also schedule an earlier appointment if necessary.  Additionally, you may be receiving a survey  about your experience at our office within a few days to 1 week by e-mail or mail. We value your feedback.  Nobie Putnam, DO [place]

## 2017-10-27 NOTE — Assessment & Plan Note (Signed)
Concern with wt loss down 6 lbs in 2 months, overall decline wt trend Poor appetite in setting of lung cancer BMI 18.31 Refill Protonix had helped him tolerate meals better before Also trial on Prednisone for AECOPD, may help improve appetite Consider future mirtazapine in future for wt gain

## 2017-10-27 NOTE — Assessment & Plan Note (Signed)
Stable, still smoking, may try OTC NRT No other substances

## 2017-10-27 NOTE — Assessment & Plan Note (Signed)
Consistent with mild acute exacerbation of COPD with worsening productive cough and dyspnea. Similar to prior exacerbations. - No hypoxia (99% on RA), afebrile, no recent hospitalization - Continues Albuterol, Spiriva  Plan: 1. Start Prednisone 50mg  x 5 day steroid burst 2. Use albuterol q 4 hr regularly x 2-3 days. Continue maintenance inhalers 3. Considered antibiotics, however afebrile and no hypoxia, would not be indicated in mild AECOPD, unless recurrent or not improved on steroids 4. Follow-up about 1 week if not improving, otherwise strict return criteria to go to ED  Advised him to check with Pulmonology regarding home oxygen issues, and make sure he schedules appropriate f/u with them as expected

## 2017-12-28 ENCOUNTER — Other Ambulatory Visit: Payer: Self-pay

## 2017-12-28 ENCOUNTER — Emergency Department
Admission: EM | Admit: 2017-12-28 | Discharge: 2017-12-28 | Disposition: A | Discharge status: Home / Self Care | Payer: Medicaid Other | Attending: Emergency Medicine | Admitting: Emergency Medicine

## 2017-12-28 ENCOUNTER — Encounter: Payer: Self-pay | Admitting: Emergency Medicine

## 2017-12-28 ENCOUNTER — Emergency Department: Payer: Medicaid Other

## 2017-12-28 DIAGNOSIS — Z5321 Procedure and treatment not carried out due to patient leaving prior to being seen by health care provider: Secondary | ICD-10-CM | POA: Diagnosis not present

## 2017-12-28 DIAGNOSIS — N6320 Unspecified lump in the left breast, unspecified quadrant: Secondary | ICD-10-CM

## 2017-12-28 LAB — CBC WITH DIFFERENTIAL/PLATELET
Basophils Absolute: 0 10*3/uL (ref 0–0.1)
Basophils Relative: 1 %
EOS ABS: 0.2 10*3/uL (ref 0–0.7)
EOS PCT: 4 %
HCT: 36.5 % — ABNORMAL LOW (ref 40.0–52.0)
Hemoglobin: 12.7 g/dL — ABNORMAL LOW (ref 13.0–18.0)
LYMPHS ABS: 1.4 10*3/uL (ref 1.0–3.6)
Lymphocytes Relative: 29 %
MCH: 30.1 pg (ref 26.0–34.0)
MCHC: 34.7 g/dL (ref 32.0–36.0)
MCV: 86.9 fL (ref 80.0–100.0)
MONO ABS: 0.6 10*3/uL (ref 0.2–1.0)
MONOS PCT: 12 %
Neutro Abs: 2.6 10*3/uL (ref 1.4–6.5)
Neutrophils Relative %: 54 %
Platelets: 232 10*3/uL (ref 150–440)
RBC: 4.2 MIL/uL — AB (ref 4.40–5.90)
RDW: 16.4 % — ABNORMAL HIGH (ref 11.5–14.5)
WBC: 4.8 10*3/uL (ref 3.8–10.6)

## 2017-12-28 LAB — COMPREHENSIVE METABOLIC PANEL
ALT: 8 U/L — AB (ref 17–63)
AST: 18 U/L (ref 15–41)
Albumin: 3.7 g/dL (ref 3.5–5.0)
Alkaline Phosphatase: 65 U/L (ref 38–126)
Anion gap: 8 (ref 5–15)
BUN: 13 mg/dL (ref 6–20)
CALCIUM: 9.1 mg/dL (ref 8.9–10.3)
CO2: 27 mmol/L (ref 22–32)
CREATININE: 0.91 mg/dL (ref 0.61–1.24)
Chloride: 104 mmol/L (ref 101–111)
GFR calc non Af Amer: >60 mL/min (ref >60)
Glucose, Bld: 96 mg/dL (ref 65–99)
Potassium: 3.7 mmol/L (ref 3.5–5.1)
Sodium: 139 mmol/L (ref 135–145)
Total Bilirubin: 0.2 mg/dL — ABNORMAL LOW (ref 0.3–1.2)
Total Protein: 7 g/dL (ref 6.5–8.1)

## 2017-12-28 NOTE — ED Notes (Signed)
States he needs to leave  Has an emergency at home

## 2017-12-28 NOTE — ED Triage Notes (Signed)
C/O "knot" to left chest x 1 month.  States knot is painful.  Knot located just above left nipple.

## 2017-12-28 NOTE — ED Provider Notes (Signed)
[facility] Emergency Department Provider Note  ____________________________________________   First MD Initiated Contact with Patient 12/28/17 1323     (approximate)  I have reviewed the triage vital signs and the nursing notes.   HISTORY  Chief Complaint knot on chest    HPI David [name] is a 62 y.o. male presents emergency department complaining of a painful knot right above the nipple in his left breast.  He states this is been there for about a month.  He states it is painful and has gotten larger.  He denies any fever or chills.  He denies any redness or pus.  He denies any drainage from the nipple itself.  Past Medical History:  Diagnosis Date  . Arthritis   . Asthma   . COPD (chronic obstructive pulmonary disease) (HCC)    not on home o2  . Gout   . History of kidney stones   . History of melanoma   . Hypertension   . Oxygen deficiency    O2 1 LITER [facility]  . Pain    chronic pain all over  . Small cell lung cancer (HCC)    melanoma  . Tobacco use     Patient Active Problem List   Diagnosis Date Noted  . Severe protein-calorie malnutrition ([place]) 10/27/2017  . Hypertension 04/26/2017  . Chronic pain syndrome 09/28/2016  . Long term current use of opiate analgesic 09/28/2016  . Long term prescription opiate use 09/28/2016  . Disturbance of skin sensation 09/28/2016  . Lung mass   . Anemia 07/19/2016  . Multiple lung nodules 07/19/2016  . Chest pain 07/18/2016  . Primary osteoarthritis involving multiple joints 06/23/2016  . Chronic bursitis of left shoulder 06/23/2016  . Chronic pain of both shoulders 06/23/2016  . History of substance abuse 06/23/2016  . Tobacco abuse 04/20/2016  . History of melanoma 04/20/2016  . COPD (chronic obstructive pulmonary disease) ([place]) 04/20/2016  . Gout 04/20/2016  . Adjustment disorder 04/20/2016  . Insomnia 04/20/2016  . Chronic pain due to neoplasm 04/20/2016  . Opiate use 04/20/2016  .  Small cell lung cancer ([place]) 09/08/2015    Past Surgical History:  Procedure Laterality Date  . ELECTROMAGNETIC NAVIGATION BROCHOSCOPY N/A 07/22/2016   Procedure: ELECTROMAGNETIC NAVIGATION BRONCHOSCOPY;  Surgeon: Flora Lipps, MD;  Location: [facility] ORS;  Service: Cardiopulmonary;  Laterality: N/A;  . PERIPHERAL VASCULAR CATHETERIZATION N/A 09/15/2015   Procedure: Glori Luis Cath Insertion;  Surgeon: Algernon Huxley, MD;  Location: [place] CV LAB;  Service: Cardiovascular;  Laterality: N/A;  . PORTA CATH REMOVAL N/A 05/05/2017   Procedure: Glori Luis Cath Removal;  Surgeon: Algernon Huxley, MD;  Location: [place] [facility];  Service: Cardiovascular;  Laterality: N/A;  . SKIN GRAFT     [facility]    Prior to Admission medications   Medication Sig Start Date End Date Taking? Authorizing Provider  albuterol (PROAIR HFA) 108 (90 Base) MCG/ACT inhaler Inhale 2 puffs into the lungs every 6 (six) hours as needed for wheezing or shortness of breath. 05/02/17   Karamalegos, Devonne Doughty, DO  apixaban (ELIQUIS) 5 MG TABS tablet Take 1 tablet (5 mg total) by mouth 2 (two) times daily. The starting dose is 10 mg (two 5 mg tablets) taken TWICE daily for the FIRST SEVEN (7) DAYS, then on the seventh day the dose is reduced to ONE 5 mg tablet taken TWICE daily. Eliquis may be taken with or without food. 10/19/17   Merlyn Lot, MD  feeding  supplement, ENSURE ENLIVE, (ENSURE ENLIVE) LIQD Take 237 mLs by mouth 3 (three) times daily between meals. 07/19/16   Theodoro Grist, MD  gabapentin (NEURONTIN) 300 MG capsule Take 1 capsule (300 mg total) by mouth 3 (three) times daily. Patient not taking: Reported on 08/29/2017 07/12/17   Olin Hauser, DO  ipratropium (ATROVENT) 0.06 % nasal spray Place 2 sprays into both nostrils 4 (four) times daily. For up to 5-7 days then stop. 10/27/17   Karamalegos, Devonne Doughty, DO  pantoprazole (PROTONIX) 40 MG tablet Take 1 tablet (40 mg total) by mouth daily. 10/27/17   Karamalegos,  Devonne Doughty, DO  predniSONE (DELTASONE) 10 MG tablet Take 6 tabs with breakfast Day 1, 5 tabs Day 2, 4 tabs Day 3, 3 tabs Day 4, 2 tabs Day 5, 1 tab Day 6. 10/27/17   Karamalegos, Devonne Doughty, DO  tiotropium (SPIRIVA) 18 MCG inhalation capsule Place 1 capsule (18 mcg total) into inhaler and inhale daily. 05/02/17   Olin Hauser, DO    Allergies Tylenol [acetaminophen] and Vicodin [hydrocodone-acetaminophen]  Family History  Problem Relation Age of Onset  . Lung cancer Mother   . Heart attack Mother 75  . Cirrhosis Father 81    Social History Social History   Tobacco Use  . Smoking status: Current Every Day Smoker    Packs/day: 0.50    Years: 51.00    Pack years: 25.50    Types: Cigarettes    Start date: 08/12/1994  . Smokeless tobacco: Never Used  . Tobacco comment: used to smoke upto 2PPD, recently cut down  Substance Use Topics  . Alcohol use: No    Alcohol/week: 0.0 oz  . Drug use: No    Comment: approx. a month quit    Review of Systems  Constitutional: No fever/chills Eyes: No visual changes. ENT: No sore throat. Respiratory: Denies cough Genitourinary: Negative for dysuria. Musculoskeletal: Negative for back pain. Skin: Negative for rash.  Positive for a lump in the left breast    ____________________________________________   PHYSICAL EXAM:  VITAL SIGNS: ED Triage Vitals  Enc Vitals Group     BP 12/28/17 1246 105/72     Pulse Rate 12/28/17 1246 92     Resp 12/28/17 1246 16     Temp 12/28/17 1246 97.9 F (36.6 C)     Temp Source 12/28/17 1246 Oral     SpO2 12/28/17 1246 99 %     Weight 12/28/17 1244 145 lb (65.8 kg)     Height 12/28/17 1244 '5\' 9"'  (1.753 m)     Head Circumference --      Peak Flow --      Pain Score 12/28/17 1244 7     Pain Loc --      Pain Edu? --      Excl. in [place]? --     Constitutional: Alert and oriented. Well appearing and in no acute distress. Eyes: Conjunctivae are normal.  Head: Atraumatic. Nose: No  congestion/rhinnorhea. Mouth/Throat: Mucous membranes are moist.   Cardiovascular: Normal rate, regular rhythm.  Heart sounds are normal Respiratory: Normal respiratory effort.  No retractions, lungs clear to auscultation Left breast: Has a [place] sized nodule above the left nipple.  The area is smooth on the edges and tender to palpation.  No nipple changes or discharge noted. GU: deferred Musculoskeletal: FROM all extremities, warm and well perfused Neurologic:  Normal speech and language.  Skin:  Skin is warm, dry and intact. No rash noted. Psychiatric: Mood  and affect are normal. Speech and behavior are normal.  ____________________________________________   LABS (all labs ordered are listed, but only abnormal results are displayed)  Labs Reviewed - No data to display ____________________________________________   ____________________________________________  RADIOLOGY   ultrasound of the left breast.  [facility] [facility] called and will not do a ultrasound of this nodule unless it is ordered as an outpatient due to the follow-up.  CT of the chest with contrast  ____________________________________________   PROCEDURES  Procedure(s) performed: No  Procedures    ____________________________________________   INITIAL IMPRESSION / ASSESSMENT AND PLAN / ED COURSE  Pertinent labs & imaging results that were available during my care of the patient were reviewed by me and considered in my medical decision making (see chart for details).  Patient is a 62 year old male presents emergency department complaining of a lump in his left breast.  He states this is been there for a month and has gotten a little bit larger.  Physical exam there is an almond size lump in the left breast above the nipple.   Ultrasound of left breast.  The normal breast center called back and will not ultrasound the patient in the ER for a nodule only for a possible abscess.  During this  discussion and informed them he does have follow-up as he has small cell carcinoma of the lung and is being followed by Dr. Grayland Ormond.  Nursing staff contacted Dr. Woodfin Ganja again at the cancer center.  He instructed his staff to tell us due to his condition that this may actually be a metastatic nodule from his small cell carcinoma.  He encouraged [place] to do a CT of the chest with contrast.  CT was ordered at this time.  Labs are drawn.  Prior to the CT being done the patient states he got a phone call at home and needed to return home immediately.  He told this to the nursing staff.  He took his IV out.  And he left without instructions or without my approval.  As part of my medical decision making, I reviewed the following data within the [facility] notes reviewed and incorporated, Labs reviewed CBC has a low red blood cell count and low hematocrit, met C is basically normal, Old chart reviewed, Notes from prior ED visits and [place] Controlled Substance Database  ____________________________________________   FINAL CLINICAL IMPRESSION(S) / ED DIAGNOSES  Final diagnoses:  Mass of breast, left      NEW MEDICATIONS STARTED DURING THIS VISIT:  New Prescriptions   No medications on file     Note:  This document was prepared using Dragon voice recognition software and may include unintentional dictation errors.    Versie Starks, PA-C 12/28/17 Zenaida Deed, [place], MD 12/29/17 1430

## 2018-01-09 ENCOUNTER — Encounter: Payer: Self-pay | Admitting: Family Medicine

## 2018-01-09 ENCOUNTER — Ambulatory Visit (INDEPENDENT_AMBULATORY_CARE_PROVIDER_SITE_OTHER): Payer: Medicaid Other | Admitting: Family Medicine

## 2018-01-09 VITALS — BP 104/66 | HR 86 | Temp 98.1°F | Resp 16 | Ht 69.0 in | Wt 148.0 lb

## 2018-01-09 DIAGNOSIS — M159 Polyosteoarthritis, unspecified: Secondary | ICD-10-CM

## 2018-01-09 DIAGNOSIS — J432 Centrilobular emphysema: Secondary | ICD-10-CM

## 2018-01-09 DIAGNOSIS — N644 Mastodynia: Secondary | ICD-10-CM

## 2018-01-09 DIAGNOSIS — C3490 Malignant neoplasm of unspecified part of unspecified bronchus or lung: Secondary | ICD-10-CM

## 2018-01-09 DIAGNOSIS — M150 Primary generalized (osteo)arthritis: Secondary | ICD-10-CM | POA: Diagnosis not present

## 2018-01-09 DIAGNOSIS — K219 Gastro-esophageal reflux disease without esophagitis: Secondary | ICD-10-CM | POA: Diagnosis not present

## 2018-01-09 DIAGNOSIS — R630 Anorexia: Secondary | ICD-10-CM

## 2018-01-09 MED ORDER — ALBUTEROL SULFATE HFA 108 (90 BASE) MCG/ACT IN AERS: 2.0000 | INHALATION_SPRAY | Freq: Four times a day (QID) | RESPIRATORY_TRACT | 2 refills | Status: DC | PRN

## 2018-01-09 MED ORDER — TIZANIDINE HCL 2 MG PO CAPS: 2.0000 mg | ORAL_CAPSULE | Freq: Three times a day (TID) | ORAL | 0 refills | Status: DC | PRN

## 2018-01-09 MED ORDER — TIOTROPIUM BROMIDE MONOHYDRATE 18 MCG IN CAPS: 18.0000 ug | ORAL_CAPSULE | Freq: Every day | RESPIRATORY_TRACT | 5 refills | Status: DC

## 2018-01-09 MED ORDER — GABAPENTIN 300 MG PO CAPS: 300.0000 mg | ORAL_CAPSULE | Freq: Three times a day (TID) | ORAL | 3 refills | Status: DC

## 2018-01-09 MED ORDER — PANTOPRAZOLE SODIUM 40 MG PO TBEC: 40.0000 mg | DELAYED_RELEASE_TABLET | Freq: Every day | ORAL | 1 refills | Status: DC

## 2018-01-09 NOTE — Progress Notes (Signed)
Subjective:    Patient ID: David [name], male    DOB: Oct 09, 1955, 62 y.o.   MRN: [id number]  David [name] is a 62 y.o. male presenting on 01/09/2018 for Cyst (Lump on left breast  x 2 months started small and is bigger and hurting more.Marland Kitchenand in pain from this and cancer. Entire body hurts. )  Patient presents for a same day appointment.  HPI   ED FOLLOW-UP VISIT  Hospital/Location: [facility] Date of ED Visit: 12/28/17  Reason for Presenting to ED: Left Breast Mass / Pain  FOLLOW-UP Left Breast Pain / Palpable Nodule under Nipple - ED provider note and record have been reviewed - Patient presents today about 12 days after recent ED visit. Brief summary of recent course, patient had symptoms of left breast/nipple pain with palpable "knot" or "nodule" present for about 4-6 weeks now with some gradual increase in size and discomfort / pain, presented to ED on 5/22, testing in ED with labwork and plan for Chest CT imaging however patient left due to family problem and unable to stay at that time, ED was in contact with [facility], considered imaging [place] otherwise they were concern with history of Small Cell Lung Cancer and that he followed up with Dr Grayland Ormond, labs were unremarkable, he left AMA. - Today reports overall still has same pain in this spot and the nodule has not improved. He is scheduled now to see Dr Grayland Ormond in follow-up as planned in about 1 month in July 2019, has already scheduled Chest CT for follow-up and he states that Dr Grayland Ormond is aware of this problem. - He is asking for something for pain today, he has history of cocaine positive UDS and was not on controlled substance pain medication in past, he has followed with Oncology in past - He has run out of existing medications at this time, including gabapentin, usually takes one 300mg  TID for pain, also needs refills on other meds including Protonix.   - New medications on discharge: none - Changes to current meds  on discharge: none   Depression screen [facility] 2/9 01/09/2018 09/07/2016 04/20/2016  Decreased Interest 0 0 1  Down, Depressed, Hopeless 0 0 1  PHQ - 2 Score 0 0 2  Altered sleeping - - 3  Tired, decreased energy - - 3  Change in appetite - - 0  Feeling bad or failure about yourself  - - 0  Trouble concentrating - - 0  Moving slowly or fidgety/restless - - 0  Suicidal thoughts - - 0  PHQ-9 Score - - 8    Social History   Tobacco Use  . Smoking status: Current Every Day Smoker    Packs/day: 1.00    Years: 51.00    Pack years: 51.00    Start date: 08/12/1994  . Smokeless tobacco: Never Used  . Tobacco comment: used to smoke upto 2PPD, recently cut down  Substance Use Topics  . Alcohol use: No    Alcohol/week: 0.0 oz  . Drug use: No    Comment: approx. a month quit    Review of Systems Per HPI unless specifically indicated above     Objective:    BP 104/66   Pulse 86   Temp 98.1 F (36.7 C)   Resp 16   Ht 5\' 9"  (1.753 m)   Wt 148 lb (67.1 kg)   SpO2 99%   BMI 21.86 kg/m   Wt Readings from Last 3 Encounters:  01/09/18 148  lb (67.1 kg)  12/28/17 145 lb (65.8 kg)  10/27/17 124 lb (56.2 kg)    Physical Exam  Constitutional: He is oriented to person, place, and time. He appears well-developed and well-nourished. No distress.  Chronic ill and thin appearing, comfortable, cooperative  HENT:  Head: Normocephalic and atraumatic.  Mouth/Throat: Oropharynx is clear and moist.  Eyes: Conjunctivae are normal. Right eye exhibits no discharge. Left eye exhibits no discharge.  Cardiovascular: Normal rate.  Pulmonary/Chest: Effort normal.  Reduced air movement at baseline    Musculoskeletal: He exhibits no edema.  Neurological: He is alert and oriented to person, place, and time.  Skin: Skin is warm and dry. No rash noted. He is not diaphoretic. No erythema.  Psychiatric: He has a normal mood and affect. His behavior is normal.  Well groomed, good eye contact, normal speech  and thoughts  Nursing note and vitals reviewed.  Results for orders placed or performed during the hospital encounter of 12/28/17  CBC with Differential  Result Value Ref Range   WBC 4.8 3.8 - 10.6 K/uL   RBC 4.20 (L) 4.40 - 5.90 MIL/uL   Hemoglobin 12.7 (L) 13.0 - 18.0 g/dL   HCT 36.5 (L) 40.0 - 52.0 %   MCV 86.9 80.0 - 100.0 fL   MCH 30.1 26.0 - 34.0 pg   MCHC 34.7 32.0 - 36.0 g/dL   RDW 16.4 (H) 11.5 - 14.5 %   Platelets 232 150 - 440 K/uL   Neutrophils Relative % 54 %   Neutro Abs 2.6 1.4 - 6.5 K/uL   Lymphocytes Relative 29 %   Lymphs Abs 1.4 1.0 - 3.6 K/uL   Monocytes Relative 12 %   Monocytes Absolute 0.6 0.2 - 1.0 K/uL   Eosinophils Relative 4 %   Eosinophils Absolute 0.2 0 - 0.7 K/uL   Basophils Relative 1 %   Basophils Absolute 0.0 0 - 0.1 K/uL  Comprehensive metabolic panel  Result Value Ref Range   Sodium 139 135 - 145 mmol/L   Potassium 3.7 3.5 - 5.1 mmol/L   Chloride 104 101 - 111 mmol/L   CO2 27 22 - 32 mmol/L   Glucose, Bld 96 65 - 99 mg/dL   BUN 13 6 - 20 mg/dL   Creatinine, Ser 0.91 0.61 - 1.24 mg/dL   Calcium 9.1 8.9 - 10.3 mg/dL   Total Protein 7.0 6.5 - 8.1 g/dL   Albumin 3.7 3.5 - 5.0 g/dL   AST 18 15 - 41 U/L   ALT 8 (L) 17 - 63 U/L   Alkaline Phosphatase 65 38 - 126 U/L   Total Bilirubin 0.2 (L) 0.3 - 1.2 mg/dL   GFR calc non Af Amer >60 >60 mL/min   GFR calc Af Amer >60 >60 mL/min   Anion gap 8 5 - 15      Assessment & Plan:   Problem List Items Addressed This Visit    COPD (chronic obstructive pulmonary disease) (HCC) Currently stable without exacerbation Refill Spiriva daily, also refill Albuterol PRN only Follow-up w/ Oncology for lung cancer    Relevant Medications   tiotropium (SPIRIVA) 18 MCG inhalation capsule   albuterol (PROAIR HFA) 108 (90 Base) MCG/ACT inhaler   Primary osteoarthritis involving multiple joints (Chronic) Chronic pain multiple joints, OA/DJD Refill Gabapentin Add Tizanidine PRN for pain - primarily for  nodule of chest    Relevant Medications   gabapentin (NEURONTIN) 300 MG capsule   tizanidine (ZANAFLEX) 2 MG capsule   Small cell lung  cancer [facility]) Chronic problem followed by Oncology, now with concern of possible metastatic nodule, pending upcoming CT Chest 02/2018 - will return to Oncology - Add Tizanidine PRN pain for nodule - Will defer other management to Oncology if determine may be candidate for opiates for cancer related pain we may be able to consider this if patient initiates Palliatiave care or go through Pain Management    Relevant Medications   gabapentin (NEURONTIN) 300 MG capsule    Other Visit Diagnoses    Breast pain, left    -  Primary Secondary to nodular density superficial at nipple, question if related to known cancer, possible metastatic disease or other etiology vs lymph Return for CT Chest in 02/2018 as planned    Relevant Medications   tizanidine (ZANAFLEX) 2 MG capsule   Gastroesophageal reflux disease, esophagitis presence not specified       Relevant Medications   pantoprazole (PROTONIX) 40 MG tablet   Decreased appetite       Relevant Medications   pantoprazole (PROTONIX) 40 MG tablet      Meds ordered this encounter  Medications  . gabapentin (NEURONTIN) 300 MG capsule    Sig: Take 1 capsule (300 mg total) by mouth 3 (three) times daily.    Dispense:  90 capsule    Refill:  3  . tizanidine (ZANAFLEX) 2 MG capsule    Sig: Take 1 capsule (2 mg total) by mouth 3 (three) times daily as needed (pain).    Dispense:  90 capsule    Refill:  0  . pantoprazole (PROTONIX) 40 MG tablet    Sig: Take 1 tablet (40 mg total) by mouth daily.    Dispense:  90 tablet    Refill:  1  . tiotropium (SPIRIVA) 18 MCG inhalation capsule    Sig: Place 1 capsule (18 mcg total) into inhaler and inhale daily.    Dispense:  30 capsule    Refill:  5  . albuterol (PROAIR HFA) 108 (90 Base) MCG/ACT inhaler    Sig: Inhale 2 puffs into the lungs every 6 (six) hours as needed  for wheezing or shortness of breath.    Dispense:  8.5 Inhaler    Refill:  2    Follow up plan: Return in about 3 months (around 04/11/2018), or if symptoms worsen or fail to improve, for history of cancer / pain.  Nobie Putnam, [facility] 01/09/2018, 9:40 PM

## 2018-01-09 NOTE — Patient Instructions (Addendum)
Thank you for coming to the office today.  Try Tizanidine as needed for pain in chest wall and muscles - take one up to 3 times a day as needed for pain, if it makes you sleepy be careful with taking too often, caution sedation, in future may adjust dose if need two per dose can try to increase  Try heating pad as well to help soothe area  Refilled all meds including Gabapentin  Follow-up with Dr Grayland Ormond to discuss further imaging of the spot on Left breast and also discuss History of Blood Clot in Lung - PE - may need other therapy for this in future.  Please schedule a Follow-up Appointment to: Return in about 3 months (around 04/11/2018), or if symptoms worsen or fail to improve, for history of cancer / pain.  If you have any other questions or concerns, please feel free to call the office or send a message through [place]. You may also schedule an earlier appointment if necessary.  Additionally, you may be receiving a survey about your experience at our office within a few days to 1 week by e-mail or mail. We value your feedback.  Nobie Putnam, DO [place]

## 2018-01-10 ENCOUNTER — Telehealth: Payer: Self-pay | Admitting: Family Medicine

## 2018-01-10 ENCOUNTER — Other Ambulatory Visit: Payer: Self-pay | Admitting: Family Medicine

## 2018-01-10 DIAGNOSIS — G893 Neoplasm related pain (acute) (chronic): Secondary | ICD-10-CM

## 2018-01-10 MED ORDER — BACLOFEN 10 MG PO TABS: 5.0000 mg | ORAL_TABLET | Freq: Three times a day (TID) | ORAL | 2 refills | Status: DC | PRN

## 2018-01-10 NOTE — Telephone Encounter (Signed)
Discontinued Tizanidine, since likely not covered. Will try sending Baclofen rx to pharmacy instead.  Nobie Putnam, [facility] 01/10/2018, 5:20 PM

## 2018-01-10 NOTE — Telephone Encounter (Signed)
Pt. Came by the office states that one of the medication that was written on yesterday Insurance would not pay. I told pt to fine out what ins will pay  So  Dr. Raliegh Ip could write  A prescription he walked out said he was not going to worry.

## 2018-01-24 ENCOUNTER — Ambulatory Visit: Payer: Medicaid Other

## 2018-01-26 ENCOUNTER — Ambulatory Visit: Payer: Medicaid Other | Admitting: Oncology

## 2018-01-30 NOTE — Progress Notes (Signed)
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  INTERVAL HISTORY: Patient returns to clinic today for routine six-month evaluation and discussion of his imaging results.  He continues to complain of "pain all over".  He was recently seen in the emergency room for a "knot" in his left breast that he states is extremely painful.  By report, patient left AMA without mammographic evaluation.  He otherwise feels well.  He has no neurologic complaints.  He denies any recent fevers or illnesses.  He has a good appetite and denies weight loss.  He denies any chest pain, but continues to have chronic cough and shortness of breath. He denies any nausea, vomiting, constipation, or diarrhea. He has no urinary complaints.  Patient offers no further specific complaints today.  REVIEW OF SYSTEMS:   Review of Systems  Constitutional: Negative.  Negative for fever, malaise/fatigue and weight loss.  HENT: Negative for tinnitus.   Respiratory: Positive for cough and shortness of breath. Negative for hemoptysis.   Cardiovascular: Negative.  Negative for chest pain and leg swelling.  Gastrointestinal: Negative.  Negative for abdominal pain.  Genitourinary: Negative.   Musculoskeletal: Positive for joint pain and myalgias.  Skin: Negative.  Negative for rash.  Neurological: Negative.  Negative for sensory change, focal weakness, weakness and headaches.  Psychiatric/Behavioral: Negative.  The patient is not nervous/anxious.     As per HPI. Otherwise, a complete review of systems is negative.  PAST MEDICAL HISTORY: Past Medical History:  Diagnosis Date  . Arthritis   . Asthma   . COPD (chronic obstructive pulmonary disease) (HCC)    not on home o2  . Gout     . History of kidney stones   . History of melanoma   . Hypertension   . Oxygen deficiency    O2 1 LITER [facility]  . Pain    chronic pain all over  . Small cell lung cancer (HCC)    melanoma  . Tobacco use     PAST SURGICAL HISTORY: Past Surgical History:  Procedure Laterality Date  . ELECTROMAGNETIC NAVIGATION BROCHOSCOPY N/A 07/22/2016   Procedure: ELECTROMAGNETIC NAVIGATION BRONCHOSCOPY;  Surgeon: Flora Lipps, MD;  Location: [facility] ORS;  Service: Cardiopulmonary;  Laterality: N/A;  . PERIPHERAL VASCULAR CATHETERIZATION N/A 09/15/2015   Procedure: Glori Luis Cath Insertion;  Surgeon: Algernon Huxley, MD;  Location: [facility];  Service: Cardiovascular;  Laterality: N/A;  . PORTA CATH REMOVAL N/A 05/05/2017   Procedure: Glori Luis Cath Removal;  Surgeon: Algernon Huxley, MD;  Location: [facility];  Service: Cardiovascular;  Laterality: N/A;  . SKIN GRAFT     [facility]    FAMILY HISTORY: Reviewed and unchanged. No reported history of malignancy or chronic disease.     ADVANCED DIRECTIVES:    HEALTH MAINTENANCE: Social History   Tobacco Use  . Smoking status: Current Every Day Smoker    Packs/day: 1.00    Years: 51.00    Pack years: 51.00    Start date: 08/12/1994  . Smokeless tobacco: Never Used  . Tobacco comment: used to smoke upto 2PPD, recently cut down  Substance Use Topics  . Alcohol use: No    Alcohol/week: 0.0 oz  . Drug use: No    Comment: approx. a month quit  Colonoscopy:  PAP:  Bone density:  Lipid panel:  Allergies  Allergen Reactions  . Tylenol [Acetaminophen] Other (See Comments)    Unable to take due to chemotherapy regimen  . Vicodin [Hydrocodone-Acetaminophen] Nausea And Vomiting and Rash    Current Outpatient Medications  Medication Sig Dispense Refill  . albuterol (PROAIR HFA) 108 (90 Base) MCG/ACT inhaler Inhale 2 puffs into the lungs every 6 (six) hours as needed for wheezing or shortness of breath. 8.5 Inhaler 2  . baclofen (LIORESAL) 10  MG tablet Take 0.5-1 tablets (5-10 mg total) by mouth 3 (three) times daily as needed for muscle spasms. 30 each 2  . feeding supplement, ENSURE ENLIVE, (ENSURE ENLIVE) LIQD Take 237 mLs by mouth 3 (three) times daily between meals. 237 mL 12  . gabapentin (NEURONTIN) 300 MG capsule Take 1 capsule (300 mg total) by mouth 3 (three) times daily. 90 capsule 3  . pantoprazole (PROTONIX) 40 MG tablet Take 1 tablet (40 mg total) by mouth daily. 90 tablet 1  . tiotropium (SPIRIVA) 18 MCG inhalation capsule Place 1 capsule (18 mcg total) into inhaler and inhale daily. 30 capsule 5   No current facility-administered medications for this visit.     OBJECTIVE: Vitals:   02/02/18 1005 02/02/18 1010  BP:  93/60  Pulse:  91  Resp: 16   Temp:  98.2 F (36.8 C)     Body mass index is 18.64 kg/m.    ECOG FS:0 - Asymptomatic  General: Thin, no acute distress. Eyes: Pink conjunctiva, anicteric sclera. HEENT: Normocephalic, moist mucous membranes, clear oropharnyx. Breast: Mild swelling of left breast, but no distinct nodule palpated. Lungs: Clear to auscultation bilaterally. Heart: Regular rate and rhythm. No rubs, murmurs, or gallops. Abdomen: Soft, nontender, nondistended. No organomegaly noted, normoactive bowel sounds. Musculoskeletal: No edema, cyanosis, or clubbing. Neuro: Alert, answering all questions appropriately. Cranial nerves grossly intact. Skin: No rashes or petechiae noted. Psych: Normal affect.  LAB RESULTS:  Lab Results  Component Value Date   NA 139 12/28/2017   K 3.7 12/28/2017   CL 104 12/28/2017   CO2 27 12/28/2017   GLUCOSE 96 12/28/2017   BUN 13 12/28/2017   CREATININE 0.91 12/28/2017   CALCIUM 9.1 12/28/2017   PROT 7.0 12/28/2017   ALBUMIN 3.7 12/28/2017   AST 18 12/28/2017   ALT 8 (L) 12/28/2017   ALKPHOS 65 12/28/2017   BILITOT 0.2 (L) 12/28/2017   GFRNONAA >60 12/28/2017   GFRAA >60 12/28/2017    Lab Results  Component Value Date   WBC 4.8 12/28/2017    NEUTROABS 2.6 12/28/2017   HGB 12.7 (L) 12/28/2017   HCT 36.5 (L) 12/28/2017   MCV 86.9 12/28/2017   PLT 232 12/28/2017     STUDIES: Ct Chest W Contrast  Result Date: 01/31/2018 CLINICAL DATA:  Patient with history of lung cancer. Follow-up exam. New mid left chest swelling for 3 months. EXAM: CT CHEST WITH CONTRAST TECHNIQUE: Multidetector CT imaging of the chest was performed during intravenous contrast administration. CONTRAST:  32mL ISOVUE-300 IOPAMIDOL (ISOVUE-300) INJECTION 61% COMPARISON:  Chest CT 10/19/2017 FINDINGS: Cardiovascular: Normal heart size. Trace pericardial fluid. Thoracic aortic vascular calcifications. Mediastinum/Nodes: Stable prominent subcentimeter bilateral axillary lymph nodes. No mediastinal lymphadenopathy. Similar-appearing soft tissue within the right hilum measuring 2.5 x 1.5 cm (image 79; series 2), previously 2.4 x 1.6 cm. Similar-appearing ill-defined soft tissue within the inferior right hilum measuring 1.6 x 1.1 cm (image 90; series 2), previously 1.6 x 1.1 cm. Lungs/Pleura: Central airways are  patent. Extensive centrilobular and paraseptal emphysematous change. Spiculated lesion within the right mid lung is similar when compared to prior exam with a dominant nodular component measuring 1.7 x 1.3 cm (image 61; series 3), previously 1.6 x 1.3 cm. Similar-appearing 7 mm medial subpleural right lung nodule, previously 7 mm (image 73; series 3). Unchanged partially calcified 5 mm left upper lobe nodule (image 51; series 3). Additional bilateral scattered pulmonary nodules are grossly unchanged. No pleural effusion or pneumothorax. Upper Abdomen: Unremarkable. Musculoskeletal: Thoracic spine degenerative changes. No aggressive or acute appearing osseous lesions. Chronic superior endplate T7 wedge compression deformity. IMPRESSION: 1. Grossly stable right mid lung spiculated nodule. 2. Similar-appearing right hilar soft tissue. 3. Similar-appearing prominent right  axillary lymph nodes. 4. Grossly unchanged numerous bilateral pulmonary nodules. 5. Aortic Atherosclerosis (ICD10-I70.0) and Emphysema (ICD10-J43.9). Electronically Signed   By: Lovey Newcomer M.D.   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  PLAN:    1. 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  CT scan results from January 31, 2018 reviewed independently and reported as above with no evidence of progressive or metastatic disease.  No distinct breast lesion or subcutaneous nodule was mentioned.  We will continue to monitor every 6 months with routine CT scans.  Return to clinic in 6 months for further evaluation. 2.  Left breast pain: No distinct nodule palpated or seen on CT scan.  Patient will require mammogram for further evaluation, but he has declined this procedure.  He has been instructed to call clinic if he changes his mind. 3.  Pain: Given patient's history of substance abuse, he will not be given narcotics from this clinic.   4. History of melanoma: Patient is a poor historian but appears to have been greater than 5 years ago. Monitor. 5.  Weight loss: Patient's weight is significantly decreased today, but is unclear if he was fully on the scale at time of evaluation.  His weight has ranged from 124 to 148 pounds over the past several years.    Patient expressed understanding and was in agreement with this plan. He also understands that He can call clinic at any time with any questions, concerns, or complaints.   Lloyd Huger, MD   02/05/2018 10:12 AM

## 2018-01-31 ENCOUNTER — Ambulatory Visit: Admission: RE | Admit: 2018-01-31 | Payer: Medicaid Other | Source: Ambulatory Visit

## 2018-01-31 ENCOUNTER — Ambulatory Visit
Admission: RE | Admit: 2018-01-31 | Discharge: 2018-01-31 | Disposition: A | Discharge status: Home / Self Care | Payer: Medicaid Other | Source: Ambulatory Visit | Attending: Oncology | Admitting: Oncology

## 2018-01-31 DIAGNOSIS — C3490 Malignant neoplasm of unspecified part of unspecified bronchus or lung: Secondary | ICD-10-CM

## 2018-01-31 DIAGNOSIS — I700 Atherosclerosis of aorta: Secondary | ICD-10-CM | POA: Diagnosis not present

## 2018-01-31 DIAGNOSIS — J439 Emphysema, unspecified: Secondary | ICD-10-CM | POA: Insufficient documentation

## 2018-01-31 MED ORDER — IOPAMIDOL (ISOVUE-300) INJECTION 61%
75.0000 mL | Freq: Once | INTRAVENOUS | Status: AC | PRN
  Administered 2018-01-31: 75 mL via INTRAVENOUS

## 2018-02-02 ENCOUNTER — Inpatient Hospital Stay: Payer: Medicaid Other | Attending: Oncology | Admitting: Oncology

## 2018-02-02 ENCOUNTER — Encounter: Payer: Self-pay | Admitting: Oncology

## 2018-02-02 ENCOUNTER — Other Ambulatory Visit: Payer: Self-pay

## 2018-02-02 VITALS — BP 93/60 | HR 91 | Temp 98.2°F | Resp 16 | Ht 69.0 in | Wt 126.2 lb

## 2018-02-02 DIAGNOSIS — G8929 Other chronic pain: Secondary | ICD-10-CM

## 2018-02-02 DIAGNOSIS — C3491 Malignant neoplasm of unspecified part of right bronchus or lung: Secondary | ICD-10-CM | POA: Diagnosis present

## 2018-02-02 DIAGNOSIS — R634 Abnormal weight loss: Secondary | ICD-10-CM | POA: Insufficient documentation

## 2018-02-02 DIAGNOSIS — Z85820 Personal history of malignant melanoma of skin: Secondary | ICD-10-CM | POA: Diagnosis not present

## 2018-02-02 DIAGNOSIS — J449 Chronic obstructive pulmonary disease, unspecified: Secondary | ICD-10-CM

## 2018-02-02 DIAGNOSIS — F17210 Nicotine dependence, cigarettes, uncomplicated: Secondary | ICD-10-CM | POA: Diagnosis not present

## 2018-02-02 DIAGNOSIS — C3490 Malignant neoplasm of unspecified part of unspecified bronchus or lung: Secondary | ICD-10-CM

## 2018-02-02 NOTE — Progress Notes (Signed)
Patient reports increasing pain in his left chest area.

## 2018-02-23 ENCOUNTER — Telehealth: Payer: Self-pay | Admitting: *Deleted

## 2018-02-23 NOTE — Telephone Encounter (Signed)
Patient called for gift card for food from [facility].

## 2018-02-24 ENCOUNTER — Ambulatory Visit: Payer: Medicaid Other

## 2018-02-27 ENCOUNTER — Ambulatory Visit: Payer: Medicaid Other | Admitting: Oncology

## 2018-05-18 ENCOUNTER — Encounter: Payer: Self-pay | Admitting: Internal Medicine

## 2018-05-18 ENCOUNTER — Ambulatory Visit (INDEPENDENT_AMBULATORY_CARE_PROVIDER_SITE_OTHER): Payer: Medicaid Other | Admitting: Internal Medicine

## 2018-05-18 VITALS — BP 110/70 | HR 77 | Resp 16 | Ht 69.0 in | Wt 133.4 lb

## 2018-05-18 DIAGNOSIS — J449 Chronic obstructive pulmonary disease, unspecified: Secondary | ICD-10-CM

## 2018-05-18 DIAGNOSIS — F17210 Nicotine dependence, cigarettes, uncomplicated: Secondary | ICD-10-CM | POA: Diagnosis not present

## 2018-05-18 NOTE — Progress Notes (Signed)
[facility] Consultation      Date: 05/18/2018,   MRN# [id number] David NEBEL Dec 18, 1955 Code Status:  Code Status History    Date Active Date Inactive Code Status Order ID Comments User Context   12/31/2015  5:40 PM 01/02/2016  2:11 PM Full Code [id number]  Gladstone Lighter, MD Inpatient          AdmissionWeight: 133 lb 6.4 oz (60.5 kg)                 CurrentWeight: 133 lb 6.4 oz (60.5 kg) David [name] is a 62 y.o. old male seen in consultation for RUL nodule at the request of Dr. Grayland Ormond.     CHIEF COMPLAINT:   Follow up COPD   HISTORY OF PRESENT ILLNESS   Follow-up for COPD Previous FEV1 69% predicted in 2017 Patient has moderate COPD  Patient currently smokes 1 pack/day   Smoking Assessment and Cessation Counseling   Upon further questioning, Patient smokes 1 PPD  I have advised patient to quit/stop smoking as soon as possible due to high risk for multiple medical problems  Patient  is NOT willing to quit smoking  I have advised patient that we can assist and have options of Nicotine replacement therapy. I also advised patient on behavioral therapy and can provide oral medication therapy in conjunction with the other therapies  Follow up next Office visit  for assessment of smoking cessation  Smoking cessation counseling advised for 4 minutes    Has history of stage II small cell lung cancer diagnosed with right lung biopsy Patient has follow-up CT chest in December pending  Patient has chronic shortness of breath and dyspnea on exertion with intermittent wheezing for many years Chronic cough chronic chest congestion He uses oxygen intermittently  No infections at this time No COPD exacerbation at this time Patient is thin and cachectic  Patient has a previous history of cocaine abuse Denies drug abuse at this time     REVIEW OF SYSTEMS   Review of Systems  Constitutional: Positive for malaise/fatigue and weight  loss. Negative for chills, diaphoresis and fever.  HENT: Positive for congestion. Negative for hearing loss.   Eyes: Negative for blurred vision and double vision.  Respiratory: Positive for cough, shortness of breath and wheezing. Negative for hemoptysis and sputum production.   Cardiovascular: Negative for chest pain, palpitations, orthopnea and leg swelling.  Gastrointestinal: Negative for abdominal pain, heartburn, nausea and vomiting.  Musculoskeletal: Positive for joint pain. Negative for back pain, myalgias and neck pain.  Skin: Negative for rash.  Neurological: Negative for dizziness and weakness.  Psychiatric/Behavioral: Negative for depression.  All other systems reviewed and are negative.    VS: BP 110/70 (BP Location: Left Arm, Patient Position: Sitting, Cuff Size: Normal)   Pulse 77   Resp 16   Ht 5\' 9"  (1.753 m)   Wt 133 lb 6.4 oz (60.5 kg)   SpO2 98%   BMI 19.70 kg/m      PHYSICAL EXAM  Physical Exam  Constitutional: He is oriented to person, place, and time. No distress.  Thin, pale and cachectic, sickly looking  HENT:  Head: Normocephalic and atraumatic.  Mouth/Throat: No oropharyngeal exudate.  Eyes: Pupils are equal, round, and reactive to light. EOM are normal. No scleral icterus.  Neck: Neck supple.  Cardiovascular: Normal rate, regular rhythm and normal heart sounds.  No murmur heard. Pulmonary/Chest: No respiratory distress. He has no wheezes. He has no rales.  Abdominal:  Soft. Bowel sounds are normal.  Musculoskeletal: Normal range of motion. He exhibits no edema.  Neurological: He is alert and oriented to person, place, and time. No cranial nerve deficit.  Skin: Skin is warm. He is not diaphoretic.  Psychiatric: He has a normal mood and affect.       IMAGING  CT chest 05/2016 Spiculated right upper lobe 1.7 x 1.3 cm pulmonary nodule  previously 1.5 x 1.1 cm on 08/27/2017hest  Findings c/w recurrent cancer vs new primary cancer  ENB 12/17  Negative      ASSESSMENT/PLAN   62 year old thin white male with moderate COPD Gold stage B with a history of right upper lobe lung nodule diagnosed with stage II small cell lung cancer with a history of cocaine abuse   #1 moderate COPD Gold stage B Recommend restarting inhalers as prescribed Albuterol as needed  #2 chronic hypoxic respiratory failure from COPD  Patient uses oxygen intermittently but I have advised that he use it can as prescribed continue oxygen as   #3 stage II small cell lung cancer  Follow-up with oncology in several months with repeat CT chest   #4 deconditioned state  Encourage adequate diet  #5 tobacco cessation strongly advised  Patient satisfied with Plan of action and management. All questions answered Follow up in 6 months  Deneen Slager Patricia Pesa, M.D.  [facility]  Medical Director [place] Director [facility] Cardio-Pulmonary Department

## 2018-05-18 NOTE — Patient Instructions (Signed)
RESTART INHALERS  STOP SMOKING!!!

## 2018-05-31 ENCOUNTER — Ambulatory Visit: Payer: Medicaid Other

## 2018-07-31 ENCOUNTER — Ambulatory Visit: Admission: RE | Admit: 2018-07-31 | Payer: Medicaid Other | Source: Ambulatory Visit

## 2018-08-03 ENCOUNTER — Ambulatory Visit: Payer: Medicaid Other | Admitting: Oncology

## 2018-08-07 ENCOUNTER — Inpatient Hospital Stay: Payer: Medicaid Other | Admitting: Oncology

## 2018-08-11 ENCOUNTER — Ambulatory Visit
Admission: RE | Admit: 2018-08-11 | Discharge: 2018-08-11 | Disposition: A | Discharge status: Home / Self Care | Payer: Medicaid Other | Source: Ambulatory Visit | Attending: Oncology | Admitting: Oncology

## 2018-08-11 DIAGNOSIS — C3490 Malignant neoplasm of unspecified part of unspecified bronchus or lung: Secondary | ICD-10-CM | POA: Diagnosis present

## 2018-08-11 LAB — POCT I-STAT CREATININE: CREATININE: 1 mg/dL (ref 0.61–1.24)

## 2018-08-11 MED ORDER — IOPAMIDOL (ISOVUE-300) INJECTION 61%
60.0000 mL | Freq: Once | INTRAVENOUS | Status: AC | PRN
  Administered 2018-08-11: 60 mL via INTRAVENOUS

## 2018-08-11 NOTE — Progress Notes (Signed)
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  INTERVAL HISTORY: Patient returns to clinic today for routine 4-month evaluation and discussion of his imaging results.  He complains of left wrist and hand pain, but otherwise feels well.  He has no neurologic complaints.  He denies any recent fevers or illnesses.  He has a good appetite and denies weight loss.  He denies any chest pain, shortness of breath, cough, or hemoptysis today. He denies any nausea, vomiting, constipation, or diarrhea. He has no urinary complaints.  Patient feels at his baseline offers no specific complaints today.  REVIEW OF SYSTEMS:   Review of Systems  Constitutional: Negative.  Negative for fever, malaise/fatigue and weight loss.  HENT: Negative for tinnitus.   Respiratory: Negative.  Negative for cough, hemoptysis and shortness of breath.   Cardiovascular: Negative.  Negative for chest pain and leg swelling.  Gastrointestinal: Negative.  Negative for abdominal pain.  Genitourinary: Negative.   Musculoskeletal: Positive for joint pain. Negative for myalgias.  Skin: Negative.  Negative for rash.  Neurological: Negative.  Negative for sensory change, focal weakness, weakness and headaches.  Psychiatric/Behavioral: Negative.  The patient is not nervous/anxious.     As per HPI. Otherwise, a complete review of systems is negative.  PAST MEDICAL HISTORY: Past Medical History:  Diagnosis Date  . Arthritis   . Asthma   . COPD (chronic obstructive pulmonary disease) (HCC)    not on home o2  . Gout   . History of kidney stones   . History of melanoma   . Hypertension   . Oxygen deficiency    O2 1 LITER [facility]  . Pain    chronic pain all over  . Small cell lung  cancer (HCC)    melanoma  . Tobacco use     PAST SURGICAL HISTORY: Past Surgical History:  Procedure Laterality Date  . ELECTROMAGNETIC NAVIGATION BROCHOSCOPY N/A 07/22/2016   Procedure: ELECTROMAGNETIC NAVIGATION BRONCHOSCOPY;  Surgeon: Flora Lipps, MD;  Location: [facility];  Service: Cardiopulmonary;  Laterality: N/A;  . PERIPHERAL VASCULAR CATHETERIZATION N/A 09/15/2015   Procedure: Glori Luis Cath Insertion;  Surgeon: Algernon Huxley, MD;  Location: [place] [facility];  Service: Cardiovascular;  Laterality: N/A;  . PORTA CATH REMOVAL N/A 05/05/2017   Procedure: Glori Luis Cath Removal;  Surgeon: Algernon Huxley, MD;  Location: [place] [facility];  Service: Cardiovascular;  Laterality: N/A;  . SKIN GRAFT     [facility]    FAMILY HISTORY: Reviewed and unchanged. No reported history of malignancy or chronic disease.     ADVANCED DIRECTIVES:    HEALTH MAINTENANCE: Social History   Tobacco Use  . Smoking status: Current Every Day Smoker    Packs/day: 1.00    Years: 51.00    Pack years: 51.00    Start date: 08/12/1994  . Smokeless tobacco: Never Used  . Tobacco comment: used to smoke upto 2PPD, recently cut down  Substance Use Topics  . Alcohol use: No    Alcohol/week: 0.0 standard drinks  . Drug use: No    Comment: approx. a month quit     Colonoscopy:  PAP:  Bone density:  Lipid panel:  Allergies  Allergen Reactions  . Tylenol [Acetaminophen] Other (See Comments)    Unable to take  due to chemotherapy regimen  . Vicodin [Hydrocodone-Acetaminophen] Nausea And Vomiting and Rash    Current Outpatient Medications  Medication Sig Dispense Refill  . albuterol (PROAIR HFA) 108 (90 Base) MCG/ACT inhaler Inhale 2 puffs into the lungs every 6 (six) hours as needed for wheezing or shortness of breath. (Patient not taking: Reported on 05/18/2018) 8.5 Inhaler 2  . baclofen (LIORESAL) 10 MG tablet Take 0.5-1 tablets (5-10 mg total) by mouth 3 (three) times daily as needed for muscle spasms. (Patient  not taking: Reported on 05/18/2018) 30 each 2  . feeding supplement, ENSURE ENLIVE, (ENSURE ENLIVE) LIQD Take 237 mLs by mouth 3 (three) times daily between meals. (Patient not taking: Reported on 05/18/2018) 237 mL 12  . gabapentin (NEURONTIN) 300 MG capsule Take 1 capsule (300 mg total) by mouth 3 (three) times daily. (Patient not taking: Reported on 05/18/2018) 90 capsule 3  . pantoprazole (PROTONIX) 40 MG tablet Take 1 tablet (40 mg total) by mouth daily. (Patient not taking: Reported on 05/18/2018) 90 tablet 1  . tiotropium (SPIRIVA) 18 MCG inhalation capsule Place 1 capsule (18 mcg total) into inhaler and inhale daily. (Patient not taking: Reported on 05/18/2018) 30 capsule 5   No current facility-administered medications for this visit.     OBJECTIVE: Vitals:   08/14/18 0933  BP: 112/76  Pulse: 84  Resp: 18  Temp: 97.8 F (36.6 C)     Body mass index is 19.05 kg/m.    ECOG FS:0 - Asymptomatic  General: Well-developed, well-nourished, no acute distress. Eyes: Pink conjunctiva, anicteric sclera. HEENT: Normocephalic, moist mucous membranes. Lungs: Clear to auscultation bilaterally. Heart: Regular rate and rhythm. No rubs, murmurs, or gallops. Abdomen: Soft, nontender, nondistended. No organomegaly noted, normoactive bowel sounds. Musculoskeletal: No edema, cyanosis, or clubbing. Neuro: Alert, answering all questions appropriately. Cranial nerves grossly intact. Skin: No rashes or petechiae noted. Psych: Normal affect.  LAB RESULTS:  Lab Results  Component Value Date   NA 139 12/28/2017   K 3.7 12/28/2017   CL 104 12/28/2017   CO2 27 12/28/2017   GLUCOSE 96 12/28/2017   BUN 13 12/28/2017   CREATININE 1.00 08/11/2018   CALCIUM 9.1 12/28/2017   PROT 7.0 12/28/2017   ALBUMIN 3.7 12/28/2017   AST 18 12/28/2017   ALT 8 (L) 12/28/2017   ALKPHOS 65 12/28/2017   BILITOT 0.2 (L) 12/28/2017   GFRNONAA >60 12/28/2017   GFRAA >60 12/28/2017    Lab Results  Component  Value Date   WBC 4.8 12/28/2017   NEUTROABS 2.6 12/28/2017   HGB 12.7 (L) 12/28/2017   HCT 36.5 (L) 12/28/2017   MCV 86.9 12/28/2017   PLT 232 12/28/2017     STUDIES: Ct Chest W Contrast  Result Date: 08/11/2018 CLINICAL DATA:  Restaging right lung cancer. Chronic shortness of breath with dyspnea on exertion. EXAM: CT CHEST WITH CONTRAST TECHNIQUE: Multidetector CT imaging of the chest was performed during intravenous contrast administration. CONTRAST:  35mL ISOVUE-300 IOPAMIDOL (ISOVUE-300) INJECTION 61% COMPARISON:  CT 01/31/2018 and 10/19/2017. FINDINGS: Cardiovascular: Stable mild atherosclerosis of the aorta, great vessels and coronary arteries. No acute vascular findings identified. The heart size is normal. There is no pericardial effusion. Mediastinum/Nodes: Small mediastinal and hilar lymph nodes bilaterally are stable. Adjacent left axillary nodes appear slightly larger, measuring up to 12 mm on image 20/2. A prominent right axillary node appears unchanged. The thyroid gland, trachea and esophagus demonstrate no significant findings. Lungs/Pleura: There is no pleural effusion. Moderate centrilobular and paraseptal emphysema. There are  stable radiation changes in the right perihilar region. The dominant spiculated right upper lobe lesion is stable, measuring up to 19 x 17 mm on image 70/3. Stability is best evaluated on the sagittal images. Multiple other small pulmonary nodules are stable as well, many calcified and perifissural in location. No new or enlarging nodules identified. Upper abdomen: The visualized upper abdomen appears stable without suspicious findings. Musculoskeletal/Chest wall: There is no chest wall mass or suspicious osseous finding. Stable mild midthoracic superior endplate compression deformity. IMPRESSION: 1. Stable dominant spiculated lesion in the right upper lobe. 2. Multiple pulmonary nodules bilaterally are stable. Many of these are partially calcified. 3. Interval  mild enlargement of left axillary lymph nodes. Mildly prominent right axillary and mediastinal lymph nodes are stable. 4. Aortic Atherosclerosis (ICD10-I70.0) and Emphysema (ICD10-J43.9). Electronically Signed   By: Richardean Sale M.D.   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  PLAN:    1. 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  CT scan results from August 11, 2018 reviewed independently and reported as above with no obvious evidence of progressive or recurrent disease.  The right upper lobe nodule is unchanged.  Continue with routine evaluation using CT scans every 6 months.  If his CT scan remains stable after his next scan, patient will be 3 years removed from completing treatment and likely can be switched to yearly evaluation.  Return to clinic in 6 months to discuss his results. 2.  Left breast pain: Resolved.   3.  Pain: Given patient's history of substance abuse, he will not be given narcotics from this clinic.   4. History of melanoma: Patient is a poor historian but appears to have been greater than 5 years ago. Monitor. 5.  Hand pain: Possibly related to gout.  Continue follow-up and treatment per primary care.   Patient expressed understanding and was in agreement with this plan. He also understands that He can call clinic at any time with any questions, concerns, or complaints.   Lloyd Huger, MD   08/16/2018 6:24 AM

## 2018-08-14 ENCOUNTER — Encounter: Payer: Self-pay | Admitting: Oncology

## 2018-08-14 ENCOUNTER — Inpatient Hospital Stay: Payer: Medicaid Other | Attending: Oncology | Admitting: Oncology

## 2018-08-14 VITALS — BP 112/76 | HR 84 | Temp 97.8°F | Resp 18 | Wt 129.0 lb

## 2018-08-14 DIAGNOSIS — Z79899 Other long term (current) drug therapy: Secondary | ICD-10-CM | POA: Insufficient documentation

## 2018-08-14 DIAGNOSIS — C3490 Malignant neoplasm of unspecified part of unspecified bronchus or lung: Secondary | ICD-10-CM

## 2018-08-14 DIAGNOSIS — I10 Essential (primary) hypertension: Secondary | ICD-10-CM | POA: Diagnosis not present

## 2018-08-14 DIAGNOSIS — J449 Chronic obstructive pulmonary disease, unspecified: Secondary | ICD-10-CM

## 2018-08-14 DIAGNOSIS — R52 Pain, unspecified: Secondary | ICD-10-CM

## 2018-08-14 DIAGNOSIS — M79643 Pain in unspecified hand: Secondary | ICD-10-CM | POA: Diagnosis not present

## 2018-08-14 DIAGNOSIS — F17210 Nicotine dependence, cigarettes, uncomplicated: Secondary | ICD-10-CM

## 2018-08-14 DIAGNOSIS — C3491 Malignant neoplasm of unspecified part of right bronchus or lung: Secondary | ICD-10-CM | POA: Diagnosis present

## 2018-08-14 NOTE — Progress Notes (Signed)
Patient here today for follow up regarding lung cancer, CT results. Patient reports pain in both wrists, 8/10.

## 2018-09-04 ENCOUNTER — Other Ambulatory Visit: Payer: Self-pay

## 2018-09-04 ENCOUNTER — Emergency Department
Admission: EM | Admit: 2018-09-04 | Discharge: 2018-09-04 | Disposition: A | Discharge status: Home / Self Care | Payer: Medicaid Other | Attending: Emergency Medicine | Admitting: Emergency Medicine

## 2018-09-04 ENCOUNTER — Encounter: Payer: Self-pay | Admitting: Emergency Medicine

## 2018-09-04 DIAGNOSIS — R197 Diarrhea, unspecified: Secondary | ICD-10-CM | POA: Diagnosis not present

## 2018-09-04 DIAGNOSIS — Z85118 Personal history of other malignant neoplasm of bronchus and lung: Secondary | ICD-10-CM | POA: Diagnosis not present

## 2018-09-04 DIAGNOSIS — J449 Chronic obstructive pulmonary disease, unspecified: Secondary | ICD-10-CM | POA: Diagnosis not present

## 2018-09-04 DIAGNOSIS — R109 Unspecified abdominal pain: Secondary | ICD-10-CM | POA: Diagnosis present

## 2018-09-04 DIAGNOSIS — F17210 Nicotine dependence, cigarettes, uncomplicated: Secondary | ICD-10-CM | POA: Diagnosis not present

## 2018-09-04 DIAGNOSIS — I10 Essential (primary) hypertension: Secondary | ICD-10-CM | POA: Diagnosis not present

## 2018-09-04 LAB — CBC
HCT: 44.4 % (ref 39.0–52.0)
Hemoglobin: 14.4 g/dL (ref 13.0–17.0)
MCH: 29.8 pg (ref 26.0–34.0)
MCHC: 32.4 g/dL (ref 30.0–36.0)
MCV: 91.9 fL (ref 80.0–100.0)
Platelets: 295 10*3/uL (ref 150–400)
RBC: 4.83 MIL/uL (ref 4.22–5.81)
RDW: 13.5 % (ref 11.5–15.5)
WBC: 7.4 10*3/uL (ref 4.0–10.5)
nRBC: 0 % (ref 0.0–0.2)

## 2018-09-04 LAB — COMPREHENSIVE METABOLIC PANEL
ALBUMIN: 4.1 g/dL (ref 3.5–5.0)
ALT: 9 U/L (ref 0–44)
AST: 14 U/L — ABNORMAL LOW (ref 15–41)
Alkaline Phosphatase: 73 U/L (ref 38–126)
Anion gap: 7 (ref 5–15)
BUN: 16 mg/dL (ref 8–23)
CO2: 26 mmol/L (ref 22–32)
Calcium: 9.1 mg/dL (ref 8.9–10.3)
Chloride: 105 mmol/L (ref 98–111)
Creatinine, Ser: 1.23 mg/dL (ref 0.61–1.24)
GFR calc Af Amer: >60 mL/min (ref >60)
GFR calc non Af Amer: >60 mL/min (ref >60)
GLUCOSE: 91 mg/dL (ref 70–99)
Potassium: 3.7 mmol/L (ref 3.5–5.1)
Sodium: 138 mmol/L (ref 135–145)
Total Bilirubin: 0.4 mg/dL (ref 0.3–1.2)
Total Protein: 7.7 g/dL (ref 6.5–8.1)

## 2018-09-04 LAB — LIPASE, BLOOD: Lipase: 39 U/L (ref 11–51)

## 2018-09-04 MED ORDER — ONDANSETRON 4 MG PO TBDP: 4.0000 mg | ORAL_TABLET | Freq: Three times a day (TID) | ORAL | 0 refills | Status: DC | PRN

## 2018-09-04 MED ORDER — SODIUM CHLORIDE 0.9% FLUSH: 3.0000 mL | Freq: Once | INTRAVENOUS | Status: DC

## 2018-09-04 MED ORDER — LOPERAMIDE HCL 2 MG PO TABS: 2.0000 mg | ORAL_TABLET | Freq: Four times a day (QID) | ORAL | 0 refills | Status: DC | PRN

## 2018-09-04 NOTE — ED Provider Notes (Signed)
[facility] Emergency Department Provider Note   ____________________________________________    I have reviewed the triage vital signs and the nursing notes.   HISTORY  Chief Complaint Abdominal Pain     HPI David [name] is a 63 y.o. male with history as noted below who presents with complaints of diarrhea.  Patient describes abdominal cramping followed by diarrhea several times over the last 3 to 4 days.  He has not taken anything for this.  He feels that if he eats anything it goes right through him.  Has had one episode of nausea and vomiting but none today.  No recent travel.  Denies fevers or chills, some body aches.  Past Medical History:  Diagnosis Date  . Arthritis   . Asthma   . COPD (chronic obstructive pulmonary disease) (HCC)    not on home o2  . Gout   . History of kidney stones   . History of melanoma   . Hypertension   . Oxygen deficiency    O2 1 LITER [facility]  . Pain    chronic pain all over  . Small cell lung cancer (HCC)    melanoma  . Tobacco use     Patient Active Problem List   Diagnosis Date Noted  . Severe protein-calorie malnutrition ([place]) 10/27/2017  . Hypertension 04/26/2017  . Chronic pain syndrome 09/28/2016  . Long term current use of opiate analgesic 09/28/2016  . Long term prescription opiate use 09/28/2016  . Disturbance of skin sensation 09/28/2016  . Lung mass   . Anemia 07/19/2016  . Multiple lung nodules 07/19/2016  . Chest pain 07/18/2016  . Primary osteoarthritis involving multiple joints 06/23/2016  . Chronic bursitis of left shoulder 06/23/2016  . Chronic pain of both shoulders 06/23/2016  . History of substance abuse ([place]) 06/23/2016  . Tobacco abuse 04/20/2016  . History of melanoma 04/20/2016  . COPD (chronic obstructive pulmonary disease) ([place]) 04/20/2016  . Gout 04/20/2016  . Adjustment disorder 04/20/2016  . Insomnia 04/20/2016  . Chronic pain due to neoplasm 04/20/2016  . Opiate  use 04/20/2016  . Small cell lung cancer ([place]) 09/08/2015    Past Surgical History:  Procedure Laterality Date  . ELECTROMAGNETIC NAVIGATION BROCHOSCOPY N/A 07/22/2016   Procedure: ELECTROMAGNETIC NAVIGATION BRONCHOSCOPY;  Surgeon: Flora Lipps, MD;  Location: [facility];  Service: Cardiopulmonary;  Laterality: N/A;  . PERIPHERAL VASCULAR CATHETERIZATION N/A 09/15/2015   Procedure: Glori Luis Cath Insertion;  Surgeon: Algernon Huxley, MD;  Location: [place] [facility];  Service: Cardiovascular;  Laterality: N/A;  . PORTA CATH REMOVAL N/A 05/05/2017   Procedure: Glori Luis Cath Removal;  Surgeon: Algernon Huxley, MD;  Location: [place] [facility];  Service: Cardiovascular;  Laterality: N/A;  . SKIN GRAFT     [facility]    Prior to Admission medications   Medication Sig Start Date End Date Taking? Authorizing Provider  albuterol (PROAIR HFA) 108 (90 Base) MCG/ACT inhaler Inhale 2 puffs into the lungs every 6 (six) hours as needed for wheezing or shortness of breath. Patient not taking: Reported on 05/18/2018 01/09/18   Olin Hauser, DO  baclofen (LIORESAL) 10 MG tablet Take 0.5-1 tablets (5-10 mg total) by mouth 3 (three) times daily as needed for muscle spasms. Patient not taking: Reported on 05/18/2018 01/10/18   Olin Hauser, DO  feeding supplement, ENSURE ENLIVE, (ENSURE ENLIVE) LIQD Take 237 mLs by mouth 3 (three) times daily between meals. Patient not taking: Reported on 05/18/2018 07/19/16  Theodoro Grist, MD  gabapentin (NEURONTIN) 300 MG capsule Take 1 capsule (300 mg total) by mouth 3 (three) times daily. Patient not taking: Reported on 05/18/2018 01/09/18   Olin Hauser, DO  loperamide (IMODIUM A-D) 2 MG tablet Take 1 tablet (2 mg total) by mouth 4 (four) times daily as needed for diarrhea or loose stools. 09/04/18   Lavonia Drafts, MD  ondansetron (ZOFRAN ODT) 4 MG disintegrating tablet Take 1 tablet (4 mg total) by mouth every 8 (eight) hours as needed for nausea or  vomiting. 09/04/18   Lavonia Drafts, MD  pantoprazole (PROTONIX) 40 MG tablet Take 1 tablet (40 mg total) by mouth daily. Patient not taking: Reported on 05/18/2018 01/09/18   Olin Hauser, DO  tiotropium (SPIRIVA) 18 MCG inhalation capsule Place 1 capsule (18 mcg total) into inhaler and inhale daily. Patient not taking: Reported on 05/18/2018 01/09/18   Olin Hauser, DO     Allergies Tylenol [acetaminophen] and Vicodin [hydrocodone-acetaminophen]  Family History  Problem Relation Age of Onset  . Lung cancer Mother   . Heart attack Mother 81  . Cirrhosis Father 103    Social History Social History   Tobacco Use  . Smoking status: Current Every Day Smoker    Packs/day: 1.00    Years: 51.00    Pack years: 51.00    Start date: 08/12/1994  . Smokeless tobacco: Never Used  . Tobacco comment: used to smoke upto 2PPD, recently cut down  Substance Use Topics  . Alcohol use: No    Alcohol/week: 0.0 standard drinks  . Drug use: No    Comment: approx. a month quit    Review of Systems  Constitutional: As above Eyes: No visual changes.  ENT: No sore throat. Cardiovascular: Denies chest pain. Respiratory: Denies shortness of breath. Gastrointestinal: As above Genitourinary: Negative for dysuria. Musculoskeletal: Negative for back pain. Skin: Negative for rash. Neurological: Negative for headaches   ____________________________________________   PHYSICAL EXAM:  VITAL SIGNS: ED Triage Vitals  Enc Vitals Group     BP 09/04/18 1304 102/66     Pulse Rate 09/04/18 1304 100     Resp 09/04/18 1304 16     Temp 09/04/18 1304 (!) 97 F (36.1 C)     Temp Source 09/04/18 1304 Oral     SpO2 09/04/18 1304 99 %     Weight 09/04/18 1305 58.5 kg (128 lb 15.5 oz)     Height 09/04/18 1305 1.753 m (5\' 9" )     Head Circumference --      Peak Flow --      Pain Score 09/04/18 1305 0     Pain Loc --      Pain Edu? --      Excl. in [place]? --     Constitutional: Alert  and oriented. No acute distress. Pleasant and interactive Eyes: Conjunctivae are normal.   Nose: No congestion/rhinnorhea. Mouth/Throat: Mucous membranes are moist.    Cardiovascular: Normal rate, regular rhythm. Grossly normal heart sounds.  Good peripheral circulation. Respiratory: Normal respiratory effort.  No retractions. Gastrointestinal: Soft and nontender. No distention.  No CVA tenderness.  Reassuring exam Musculoskeletal:.  Warm and well perfused Neurologic:  Normal speech and language. No gross focal neurologic deficits are appreciated.  Skin:  Skin is warm, dry and intact. No rash noted. Psychiatric: Mood and affect are normal. Speech and behavior are normal.  ____________________________________________   LABS (all labs ordered are listed, but only abnormal results are displayed)  Labs  Reviewed  COMPREHENSIVE METABOLIC PANEL - Abnormal; Notable for the following components:      Result Value   AST 14 (*)    All other components within normal limits  LIPASE, BLOOD  CBC  URINALYSIS, COMPLETE (UACMP) WITH MICROSCOPIC   ____________________________________________  EKG  ED ECG REPORT I, Lavonia Drafts, the attending physician, personally viewed and interpreted this ECG.  Date: 09/04/2018  Rhythm: Sinus tachycardia QRS Axis: normal Intervals: normal ST/T Wave abnormalities: normal Narrative Interpretation: no evidence of acute ischemia  ____________________________________________  RADIOLOGY  None ____________________________________________   PROCEDURES  Procedure(s) performed: No  Procedures   Critical Care performed: No ____________________________________________   INITIAL IMPRESSION / ASSESSMENT AND PLAN / ED COURSE  Pertinent labs & imaging results that were available during my care of the patient were reviewed by me and considered in my medical decision making (see chart for details).  Patient well-appearing in no acute distress.   Abdominal exam is quite reassuring.  Lab work is unremarkable.  Main complaint is diarrhea for several days and some intermittent abdominal cramping.  Has not tried anything for this.  Recommend we try supportive care including Imodium and Zofran and if no improvement will need outpatient follow-up for more testing.    ____________________________________________   FINAL CLINICAL IMPRESSION(S) / ED DIAGNOSES  Final diagnoses:  Diarrhea, unspecified type        Note:  This document was prepared using Dragon voice recognition software and may include unintentional dictation errors.   Lavonia Drafts, MD 09/04/18 1450

## 2018-09-04 NOTE — ED Triage Notes (Signed)
First Nurse Note:  C/O mid abdominal pain x 1 week.  Reports vomiting yesterday.

## 2018-09-04 NOTE — ED Triage Notes (Signed)
Says stabbing pain in abdomen right at navel for 1 week that comes a goes.  Worse with certain positions. No pain right now in chair.  Also says he has diarrhea for 1 week.

## 2018-10-11 ENCOUNTER — Ambulatory Visit: Payer: Medicaid Other | Admitting: Family Medicine

## 2018-11-12 ENCOUNTER — Emergency Department
Admission: EM | Admit: 2018-11-12 | Discharge: 2018-11-12 | Disposition: A | Discharge status: Home / Self Care | Payer: Medicaid Other | Attending: Student in an Organized Health Care Education/Training Program | Admitting: Student in an Organized Health Care Education/Training Program

## 2018-11-12 ENCOUNTER — Encounter: Payer: Self-pay | Admitting: Emergency Medicine

## 2018-11-12 ENCOUNTER — Other Ambulatory Visit: Payer: Self-pay

## 2018-11-12 ENCOUNTER — Emergency Department: Payer: Medicaid Other

## 2018-11-12 DIAGNOSIS — Z85118 Personal history of other malignant neoplasm of bronchus and lung: Secondary | ICD-10-CM | POA: Diagnosis not present

## 2018-11-12 DIAGNOSIS — I10 Essential (primary) hypertension: Secondary | ICD-10-CM | POA: Diagnosis not present

## 2018-11-12 DIAGNOSIS — J441 Chronic obstructive pulmonary disease with (acute) exacerbation: Secondary | ICD-10-CM | POA: Diagnosis not present

## 2018-11-12 DIAGNOSIS — R05 Cough: Secondary | ICD-10-CM | POA: Diagnosis present

## 2018-11-12 DIAGNOSIS — R197 Diarrhea, unspecified: Secondary | ICD-10-CM | POA: Insufficient documentation

## 2018-11-12 DIAGNOSIS — Z9981 Dependence on supplemental oxygen: Secondary | ICD-10-CM | POA: Diagnosis not present

## 2018-11-12 DIAGNOSIS — Z79899 Other long term (current) drug therapy: Secondary | ICD-10-CM | POA: Insufficient documentation

## 2018-11-12 DIAGNOSIS — F17210 Nicotine dependence, cigarettes, uncomplicated: Secondary | ICD-10-CM | POA: Diagnosis not present

## 2018-11-12 LAB — CBC WITH DIFFERENTIAL/PLATELET
Abs Immature Granulocytes: 0.01 10*3/uL (ref 0.00–0.07)
Basophils Absolute: 0 10*3/uL (ref 0.0–0.1)
Basophils Relative: 1 %
Eosinophils Absolute: 0.2 10*3/uL (ref 0.0–0.5)
Eosinophils Relative: 3 %
HCT: 39.8 % (ref 39.0–52.0)
Hemoglobin: 13.1 g/dL (ref 13.0–17.0)
Immature Granulocytes: 0 %
Lymphocytes Relative: 24 %
Lymphs Abs: 1.3 10*3/uL (ref 0.7–4.0)
MCH: 29.6 pg (ref 26.0–34.0)
MCHC: 32.9 g/dL (ref 30.0–36.0)
MCV: 90 fL (ref 80.0–100.0)
Monocytes Absolute: 0.5 10*3/uL (ref 0.1–1.0)
Monocytes Relative: 9 %
Neutro Abs: 3.5 10*3/uL (ref 1.7–7.7)
Neutrophils Relative %: 63 %
Platelets: 228 10*3/uL (ref 150–400)
RBC: 4.42 MIL/uL (ref 4.22–5.81)
RDW: 13.2 % (ref 11.5–15.5)
WBC: 5.4 10*3/uL (ref 4.0–10.5)
nRBC: 0 % (ref 0.0–0.2)

## 2018-11-12 LAB — COMPREHENSIVE METABOLIC PANEL
ALT: 9 U/L (ref 0–44)
AST: 15 U/L (ref 15–41)
Albumin: 3.8 g/dL (ref 3.5–5.0)
Alkaline Phosphatase: 58 U/L (ref 38–126)
Anion gap: 10 (ref 5–15)
BUN: 12 mg/dL (ref 8–23)
CO2: 27 mmol/L (ref 22–32)
Calcium: 8.7 mg/dL — ABNORMAL LOW (ref 8.9–10.3)
Chloride: 99 mmol/L (ref 98–111)
Creatinine, Ser: 1.1 mg/dL (ref 0.61–1.24)
GFR calc Af Amer: >60 mL/min (ref >60)
GFR calc non Af Amer: >60 mL/min (ref >60)
Glucose, Bld: 123 mg/dL — ABNORMAL HIGH (ref 70–99)
Potassium: 3.7 mmol/L (ref 3.5–5.1)
Sodium: 136 mmol/L (ref 135–145)
Total Bilirubin: 0.6 mg/dL (ref 0.3–1.2)
Total Protein: 7.4 g/dL (ref 6.5–8.1)

## 2018-11-12 LAB — LACTIC ACID, PLASMA: Lactic Acid, Venous: 2 mmol/L (ref 0.5–1.9)

## 2018-11-12 MED ORDER — PREDNISONE 20 MG PO TABS: 40.0000 mg | ORAL_TABLET | Freq: Every day | ORAL | 0 refills | Status: AC

## 2018-11-12 MED ORDER — PREDNISONE 20 MG PO TABS
60.0000 mg | ORAL_TABLET | Freq: Once | ORAL | Status: AC
  Administered 2018-11-12: 60 mg via ORAL
  Filled 2018-11-12: qty 3

## 2018-11-12 MED ORDER — SODIUM CHLORIDE 0.9 % IV BOLUS
500.0000 mL | Freq: Once | INTRAVENOUS | Status: AC
  Administered 2018-11-12: 500 mL via INTRAVENOUS

## 2018-11-12 MED ORDER — ALBUTEROL SULFATE HFA 108 (90 BASE) MCG/ACT IN AERS
2.0000 | INHALATION_SPRAY | Freq: Once | RESPIRATORY_TRACT | Status: AC
  Administered 2018-11-12: 2 via RESPIRATORY_TRACT
  Filled 2018-11-12: qty 6.7

## 2018-11-12 NOTE — ED Notes (Signed)
Date and time results received: 11/12/18 1352   Test: lactic acid Critical Value: 2.0  Name of Provider Notified: Dr. Quentin Cornwall

## 2018-11-12 NOTE — ED Provider Notes (Signed)
[facility] Emergency Department Provider Note    First MD Initiated Contact with Patient 11/12/18 1251     (approximate)  I have reviewed the triage vital signs and the nursing notes.   HISTORY  Chief Complaint Diarrhea; Cough; and Nasal Congestion    HPI David [name] is a 63 y.o. male below listed past medical history presents the ER for flulike illness as well as loose nonbloody non-melanotic stools for the past several days.  Has had similar symptoms before and was seen in January for similar symptoms.  Denies any recent antibiotics.  Does not feel more short of breath than his baseline.  Has had some congestion.  No neck stiffness.  Mild headaches.  Denies any numbness or tingling.  No sick contacts.  States he has been isolating at home for the past several weeks.    Past Medical History:  Diagnosis Date  . Arthritis   . Asthma   . COPD (chronic obstructive pulmonary disease) (HCC)    not on home o2  . Gout   . History of kidney stones   . History of melanoma   . Hypertension   . Oxygen deficiency    O2 1 LITER [facility]  . Pain    chronic pain all over  . Small cell lung cancer (HCC)    melanoma  . Tobacco use    Family History  Problem Relation Age of Onset  . Lung cancer Mother   . Heart attack Mother 39  . Cirrhosis Father 61   Past Surgical History:  Procedure Laterality Date  . ELECTROMAGNETIC NAVIGATION BROCHOSCOPY N/A 07/22/2016   Procedure: ELECTROMAGNETIC NAVIGATION BRONCHOSCOPY;  Surgeon: Flora Lipps, MD;  Location: [facility] ORS;  Service: Cardiopulmonary;  Laterality: N/A;  . PERIPHERAL VASCULAR CATHETERIZATION N/A 09/15/2015   Procedure: Glori Luis Cath Insertion;  Surgeon: Algernon Huxley, MD;  Location: [place] [facility];  Service: Cardiovascular;  Laterality: N/A;  . PORTA CATH REMOVAL N/A 05/05/2017   Procedure: Glori Luis Cath Removal;  Surgeon: Algernon Huxley, MD;  Location: [place] [facility];  Service: Cardiovascular;  Laterality:  N/A;  . SKIN GRAFT     [facility]   Patient Active Problem List   Diagnosis Date Noted  . Severe protein-calorie malnutrition ([place]) 10/27/2017  . Hypertension 04/26/2017  . Chronic pain syndrome 09/28/2016  . Long term current use of opiate analgesic 09/28/2016  . Long term prescription opiate use 09/28/2016  . Disturbance of skin sensation 09/28/2016  . Lung mass   . Anemia 07/19/2016  . Multiple lung nodules 07/19/2016  . Chest pain 07/18/2016  . Primary osteoarthritis involving multiple joints 06/23/2016  . Chronic bursitis of left shoulder 06/23/2016  . Chronic pain of both shoulders 06/23/2016  . History of substance abuse ([place]) 06/23/2016  . Tobacco abuse 04/20/2016  . History of melanoma 04/20/2016  . COPD (chronic obstructive pulmonary disease) ([place]) 04/20/2016  . Gout 04/20/2016  . Adjustment disorder 04/20/2016  . Insomnia 04/20/2016  . Chronic pain due to neoplasm 04/20/2016  . Opiate use 04/20/2016  . Small cell lung cancer ([place]) 09/08/2015      Prior to Admission medications   Medication Sig Start Date End Date Taking? Authorizing Provider  albuterol (PROAIR HFA) 108 (90 Base) MCG/ACT inhaler Inhale 2 puffs into the lungs every 6 (six) hours as needed for wheezing or shortness of breath. Patient not taking: Reported on 05/18/2018 01/09/18   Olin Hauser, DO  baclofen (LIORESAL) 10 MG tablet  Take 0.5-1 tablets (5-10 mg total) by mouth 3 (three) times daily as needed for muscle spasms. Patient not taking: Reported on 05/18/2018 01/10/18   Olin Hauser, DO  feeding supplement, ENSURE ENLIVE, (ENSURE ENLIVE) LIQD Take 237 mLs by mouth 3 (three) times daily between meals. Patient not taking: Reported on 05/18/2018 07/19/16   Theodoro Grist, MD  gabapentin (NEURONTIN) 300 MG capsule Take 1 capsule (300 mg total) by mouth 3 (three) times daily. Patient not taking: Reported on 05/18/2018 01/09/18   Olin Hauser, DO  loperamide (IMODIUM A-D) 2 MG  tablet Take 1 tablet (2 mg total) by mouth 4 (four) times daily as needed for diarrhea or loose stools. 09/04/18   Lavonia Drafts, MD  ondansetron (ZOFRAN ODT) 4 MG disintegrating tablet Take 1 tablet (4 mg total) by mouth every 8 (eight) hours as needed for nausea or vomiting. 09/04/18   Lavonia Drafts, MD  pantoprazole (PROTONIX) 40 MG tablet Take 1 tablet (40 mg total) by mouth daily. Patient not taking: Reported on 05/18/2018 01/09/18   Olin Hauser, DO  tiotropium (SPIRIVA) 18 MCG inhalation capsule Place 1 capsule (18 mcg total) into inhaler and inhale daily. Patient not taking: Reported on 05/18/2018 01/09/18   Olin Hauser, DO    Allergies Tylenol [acetaminophen] and Vicodin [hydrocodone-acetaminophen]    Social History Social History   Tobacco Use  . Smoking status: Current Every Day Smoker    Packs/day: 1.00    Years: 51.00    Pack years: 51.00    Start date: 08/12/1994  . Smokeless tobacco: Never Used  . Tobacco comment: used to smoke upto 2PPD, recently cut down  Substance Use Topics  . Alcohol use: No    Alcohol/week: 0.0 standard drinks  . Drug use: No    Comment: approx. a month quit    Review of Systems Patient denies headaches, rhinorrhea, blurry vision, numbness, shortness of breath, chest pain, edema, cough, abdominal pain, nausea, vomiting, diarrhea, dysuria, fevers, rashes or hallucinations unless otherwise stated above in HPI. ____________________________________________   PHYSICAL EXAM:  VITAL SIGNS: Vitals:   11/12/18 1300 11/12/18 1418  BP: 128/77 112/64  Pulse: 85 79  Resp: 18 18  Temp: 97.8 F (36.6 C)   SpO2: 100% 100%    Constitutional: Alert and oriented.  Eyes: Conjunctivae are normal.  Head: Atraumatic. Nose: No congestion/rhinnorhea. Mouth/Throat: Mucous membranes are moist.   Neck: No stridor. Painless ROM.  Cardiovascular: Normal rate, regular rhythm. Grossly normal heart sounds.  Good peripheral circulation.  Respiratory: Normal respiratory effort.  No retractions. Lungs with coarse wheeze throughout.  Good airmovement Gastrointestinal: Soft and nontender. No distention. No abdominal bruits. No CVA tenderness. Genitourinary:  Musculoskeletal: No lower extremity tenderness nor edema.  No joint effusions. Neurologic:  Normal speech and language. No gross focal neurologic deficits are appreciated. No facial droop Skin:  Skin is warm, dry and intact. No rash noted. Psychiatric: Mood and affect are normal. Speech and behavior are normal.  ____________________________________________   LABS (all labs ordered are listed, but only abnormal results are displayed)  Results for orders placed or performed during the hospital encounter of 11/12/18 (from the past 24 hour(s))  Lactic acid, plasma     Status: Abnormal   Collection Time: 11/12/18  1:20 PM  Result Value Ref Range   Lactic Acid, Venous 2.0 (HH) 0.5 - 1.9 mmol/L  Comprehensive metabolic panel     Status: Abnormal   Collection Time: 11/12/18  1:20 PM  Result Value Ref  Range   Sodium 136 135 - 145 mmol/L   Potassium 3.7 3.5 - 5.1 mmol/L   Chloride 99 98 - 111 mmol/L   CO2 27 22 - 32 mmol/L   Glucose, Bld 123 (H) 70 - 99 mg/dL   BUN 12 8 - 23 mg/dL   Creatinine, Ser 1.10 0.61 - 1.24 mg/dL   Calcium 8.7 (L) 8.9 - 10.3 mg/dL   Total Protein 7.4 6.5 - 8.1 g/dL   Albumin 3.8 3.5 - 5.0 g/dL   AST 15 15 - 41 U/L   ALT 9 0 - 44 U/L   Alkaline Phosphatase 58 38 - 126 U/L   Total Bilirubin 0.6 0.3 - 1.2 mg/dL   GFR calc non Af Amer >60 >60 mL/min   GFR calc Af Amer >60 >60 mL/min   Anion gap 10 5 - 15  CBC WITH DIFFERENTIAL     Status: None   Collection Time: 11/12/18  1:20 PM  Result Value Ref Range   WBC 5.4 4.0 - 10.5 K/uL   RBC 4.42 4.22 - 5.81 MIL/uL   Hemoglobin 13.1 13.0 - 17.0 g/dL   HCT 39.8 39.0 - 52.0 %   MCV 90.0 80.0 - 100.0 fL   MCH 29.6 26.0 - 34.0 pg   MCHC 32.9 30.0 - 36.0 g/dL   RDW 13.2 11.5 - 15.5 %   Platelets 228  150 - 400 K/uL   nRBC 0.0 0.0 - 0.2 %   Neutrophils Relative % 63 %   Neutro Abs 3.5 1.7 - 7.7 K/uL   Lymphocytes Relative 24 %   Lymphs Abs 1.3 0.7 - 4.0 K/uL   Monocytes Relative 9 %   Monocytes Absolute 0.5 0.1 - 1.0 K/uL   Eosinophils Relative 3 %   Eosinophils Absolute 0.2 0.0 - 0.5 K/uL   Basophils Relative 1 %   Basophils Absolute 0.0 0.0 - 0.1 K/uL   Immature Granulocytes 0 %   Abs Immature Granulocytes 0.01 0.00 - 0.07 K/uL   ____________________________________________  ____________________________________________  RADIOLOGY  I personally reviewed all radiographic images ordered to evaluate for the above acute complaints and reviewed radiology reports and findings.  These findings were personally discussed with the patient.  Please see medical record for radiology report.  ____________________________________________   PROCEDURES  Procedure(s) performed:  Procedures    Critical Care performed: no ____________________________________________   INITIAL IMPRESSION / ASSESSMENT AND PLAN / ED COURSE  Pertinent labs & imaging results that were available during my care of the patient were reviewed by me and considered in my medical decision making (see chart for details).   DDX: Hydration, electrolyte abnormality, I LI, COVID-19, enteritis, gastritis, COPD exacerbation  SUNDIATA FERRICK is a 63 y.o. who presents to the ED with symptoms as described above he is afebrile and hemodynamically stable.  Blood work was sent for the blood differential shows no evidence of leukocytosis or leukopenia.  Renal function is normal.  No evidence of acidosis.  Lactate 2.0 will give gentle IV hydration.  Doubt infectious colitis as his abdominal exam is soft and benign.  Clinical Course as of Nov 12 1514  Sun Nov 12, 2018  1515 Patient reassessed.  He is tolerating oral hydration.  Feels improved after gentle IV hydration.  Do not feel that further diagnostic testing clinically  indicated.  Does not meet criteria for covid 19 testing based on Ziebach algorithm.  Discussed signs and symptoms for which she should return to the ER.   [  PR]    Clinical Course User Index [PR] Merlyn Lot, MD     The patient was evaluated in Emergency Department today for the symptoms described in the history of present illness. He/she was evaluated in the context of the global COVID-19 pandemic, which necessitated consideration that the patient might be at risk for infection with the SARS-CoV-2 virus that causes COVID-19. Institutional protocols and algorithms that pertain to the evaluation of patients at risk for COVID-19 are in a state of rapid change based on information released by regulatory bodies including the CDC and federal and state organizations. These policies and algorithms were followed during the patient's care in the ED.   As part of my medical decision making, I reviewed the following data within the [place] notes reviewed and incorporated, Labs reviewed, notes from prior ED visits.   ____________________________________________   FINAL CLINICAL IMPRESSION(S) / ED DIAGNOSES  Final diagnoses:  Chronic obstructive pulmonary disease with acute exacerbation (HCC)  Diarrhea, unspecified type      NEW MEDICATIONS STARTED DURING THIS VISIT:  New Prescriptions   No medications on file     Note:  This document was prepared using Dragon voice recognition software and may include unintentional dictation errors.    Merlyn Lot, MD 11/12/18 [phone]

## 2018-11-12 NOTE — ED Notes (Signed)
First Nurse Note: Pt to ED c/o cough, runny nose and diarrhea x 7 days. Pt was given face mask and taken directly to CPOD.

## 2018-11-12 NOTE — ED Triage Notes (Signed)
Pt to ER with c/o cough, runny nose and diarrhea for last 7 days.  Pt states hx of lung CA.

## 2019-01-19 ENCOUNTER — Other Ambulatory Visit: Payer: Self-pay | Admitting: Family Medicine

## 2019-01-19 DIAGNOSIS — M159 Polyosteoarthritis, unspecified: Secondary | ICD-10-CM

## 2019-01-19 DIAGNOSIS — R630 Anorexia: Secondary | ICD-10-CM

## 2019-01-19 DIAGNOSIS — C3490 Malignant neoplasm of unspecified part of unspecified bronchus or lung: Secondary | ICD-10-CM

## 2019-01-19 DIAGNOSIS — J432 Centrilobular emphysema: Secondary | ICD-10-CM

## 2019-01-19 DIAGNOSIS — G893 Neoplasm related pain (acute) (chronic): Secondary | ICD-10-CM

## 2019-01-19 DIAGNOSIS — K219 Gastro-esophageal reflux disease without esophagitis: Secondary | ICD-10-CM

## 2019-02-10 NOTE — Progress Notes (Deleted)
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  INTERVAL HISTORY: Patient returns to clinic today for routine 22-month evaluation and discussion of his imaging results.  He complains of left wrist and hand pain, but otherwise feels well.  He has no neurologic complaints.  He denies any recent fevers or illnesses.  He has a good appetite and denies weight loss.  He denies any chest pain, shortness of breath, cough, or hemoptysis today. He denies any nausea, vomiting, constipation, or diarrhea. He has no urinary complaints.  Patient feels at his baseline offers no specific complaints today.  REVIEW OF SYSTEMS:   Review of Systems  Constitutional: Negative.  Negative for fever, malaise/fatigue and weight loss.  HENT: Negative for tinnitus.   Respiratory: Negative.  Negative for cough, hemoptysis and shortness of breath.   Cardiovascular: Negative.  Negative for chest pain and leg swelling.  Gastrointestinal: Negative.  Negative for abdominal pain.  Genitourinary: Negative.   Musculoskeletal: Positive for joint pain. Negative for myalgias.  Skin: Negative.  Negative for rash.  Neurological: Negative.  Negative for sensory change, focal weakness, weakness and headaches.  Psychiatric/Behavioral: Negative.  The patient is not nervous/anxious.     As per HPI. Otherwise, a complete review of systems is negative.  PAST MEDICAL HISTORY: Past Medical History:  Diagnosis Date  . Arthritis   . Asthma   . COPD (chronic obstructive pulmonary disease) (HCC)    not on home o2  . Gout   . History of kidney stones   . History of melanoma   . Hypertension   . Oxygen deficiency    O2 1 LITER [facility]  . Pain    chronic pain all over  . Small cell lung  cancer (HCC)    melanoma  . Tobacco use     PAST SURGICAL HISTORY: Past Surgical History:  Procedure Laterality Date  . ELECTROMAGNETIC NAVIGATION BROCHOSCOPY N/A 07/22/2016   Procedure: ELECTROMAGNETIC NAVIGATION BRONCHOSCOPY;  Surgeon: Flora Lipps, MD;  Location: [facility] ORS;  Service: Cardiopulmonary;  Laterality: N/A;  . PERIPHERAL VASCULAR CATHETERIZATION N/A 09/15/2015   Procedure: Glori Luis Cath Insertion;  Surgeon: Algernon Huxley, MD;  Location: [place] [facility];  Service: Cardiovascular;  Laterality: N/A;  . PORTA CATH REMOVAL N/A 05/05/2017   Procedure: Glori Luis Cath Removal;  Surgeon: Algernon Huxley, MD;  Location: [place] [facility];  Service: Cardiovascular;  Laterality: N/A;  . SKIN GRAFT     [facility]    FAMILY HISTORY: Reviewed and unchanged. No reported history of malignancy or chronic disease.     ADVANCED DIRECTIVES:    HEALTH MAINTENANCE: Social History   Tobacco Use  . Smoking status: Current Every Day Smoker    Packs/day: 1.00    Years: 51.00    Pack years: 51.00    Start date: 08/12/1994  . Smokeless tobacco: Never Used  . Tobacco comment: used to smoke upto 2PPD, recently cut down  Substance Use Topics  . Alcohol use: No    Alcohol/week: 0.0 standard drinks  . Drug use: No    Comment: approx. a month quit     Colonoscopy:  PAP:  Bone density:  Lipid panel:  Allergies  Allergen Reactions  . Tylenol [Acetaminophen] Other (See Comments)    Unable to take  due to chemotherapy regimen  . Vicodin [Hydrocodone-Acetaminophen] Nausea And Vomiting and Rash    Current Outpatient Medications  Medication Sig Dispense Refill  . baclofen (LIORESAL) 10 MG tablet TAKE 0.5-1 TABLETS (5-10 MG TOTAL) BY MOUTH 3 (THREE) TIMES DAILY AS NEEDED FOR MUSCLE SPASMS. 30 tablet 2  . gabapentin (NEURONTIN) 300 MG capsule TAKE 1 CAPSULE BY MOUTH THREE TIMES A DAY 90 capsule 3  . pantoprazole (PROTONIX) 40 MG tablet TAKE 1 TABLET BY MOUTH EVERY DAY 90 tablet 1  . PROAIR HFA 108 (90  Base) MCG/ACT inhaler TAKE 2 PUFFS BY MOUTH EVERY 6 HOURS AS NEEDED FOR WHEEZE OR SHORTNESS OF BREATH 8.5 Inhaler 2  . SPIRIVA HANDIHALER 18 MCG inhalation capsule INHALE 1 CAPSULE VIA HANDIHALER ONCE DAILY AT THE SAME TIME EVERY DAY 30 capsule 5  . feeding supplement, ENSURE ENLIVE, (ENSURE ENLIVE) LIQD Take 237 mLs by mouth 3 (three) times daily between meals. (Patient not taking: Reported on 05/18/2018) 237 mL 12  . loperamide (IMODIUM A-D) 2 MG tablet Take 1 tablet (2 mg total) by mouth 4 (four) times daily as needed for diarrhea or loose stools. 30 tablet 0  . ondansetron (ZOFRAN ODT) 4 MG disintegrating tablet Take 1 tablet (4 mg total) by mouth every 8 (eight) hours as needed for nausea or vomiting. 20 tablet 0   No current facility-administered medications for this visit.     OBJECTIVE: There were no vitals filed for this visit.   There is no height or weight on file to calculate BMI.    ECOG FS:0 - Asymptomatic  General: Well-developed, well-nourished, no acute distress. Eyes: Pink conjunctiva, anicteric sclera. HEENT: Normocephalic, moist mucous membranes. Lungs: Clear to auscultation bilaterally. Heart: Regular rate and rhythm. No rubs, murmurs, or gallops. Abdomen: Soft, nontender, nondistended. No organomegaly noted, normoactive bowel sounds. Musculoskeletal: No edema, cyanosis, or clubbing. Neuro: Alert, answering all questions appropriately. Cranial nerves grossly intact. Skin: No rashes or petechiae noted. Psych: Normal affect.  LAB RESULTS:  Lab Results  Component Value Date   NA 136 11/12/2018   K 3.7 11/12/2018   CL 99 11/12/2018   CO2 27 11/12/2018   GLUCOSE 123 (H) 11/12/2018   BUN 12 11/12/2018   CREATININE 1.10 11/12/2018   CALCIUM 8.7 (L) 11/12/2018   PROT 7.4 11/12/2018   ALBUMIN 3.8 11/12/2018   AST 15 11/12/2018   ALT 9 11/12/2018   ALKPHOS 58 11/12/2018   BILITOT 0.6 11/12/2018   GFRNONAA >60 11/12/2018   GFRAA >60 11/12/2018    Lab Results   Component Value Date   WBC 5.4 11/12/2018   NEUTROABS 3.5 11/12/2018   HGB 13.1 11/12/2018   HCT 39.8 11/12/2018   MCV 90.0 11/12/2018   PLT 228 11/12/2018     STUDIES: No results found.  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  PLAN:    1. 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  CT scan results from August 11, 2018 reviewed independently and reported as above with no obvious evidence of progressive or recurrent disease.  The right upper lobe nodule is unchanged.  Continue with routine evaluation using CT scans every 6 months.  If his CT scan remains stable after his next scan, patient will be 3 years removed from completing treatment and likely can be switched to yearly evaluation.  Return to clinic in 6 months to discuss his results. 2.  Left breast pain: Resolved.  3.  Pain: Given patient's history of substance abuse, he will not be given narcotics from this clinic.   4. History of melanoma: Patient is a poor historian but appears to have been greater than 5 years ago. Monitor. 5.  Hand pain: Possibly related to gout.  Continue follow-up and treatment per primary care.   Patient expressed understanding and was in agreement with this plan. He also understands that He can call clinic at any time with any questions, concerns, or complaints.   Lloyd Huger, MD   02/10/2019 9:18 AM

## 2019-02-12 ENCOUNTER — Inpatient Hospital Stay: Payer: Medicaid Other

## 2019-02-12 ENCOUNTER — Ambulatory Visit
Admission: RE | Admit: 2019-02-12 | Discharge: 2019-02-12 | Disposition: A | Discharge status: Home / Self Care | Payer: Medicaid Other | Source: Ambulatory Visit | Attending: Oncology | Admitting: Oncology

## 2019-02-12 ENCOUNTER — Other Ambulatory Visit: Payer: Self-pay

## 2019-02-12 DIAGNOSIS — C3490 Malignant neoplasm of unspecified part of unspecified bronchus or lung: Secondary | ICD-10-CM | POA: Diagnosis present

## 2019-02-12 LAB — POCT I-STAT CREATININE: Creatinine, Ser: 1.1 mg/dL (ref 0.61–1.24)

## 2019-02-12 MED ORDER — IOHEXOL 300 MG/ML  SOLN
75.0000 mL | Freq: Once | INTRAMUSCULAR | Status: AC | PRN
  Administered 2019-02-12: 75 mL via INTRAVENOUS

## 2019-02-15 ENCOUNTER — Ambulatory Visit: Payer: Medicaid Other | Admitting: Oncology

## 2019-02-16 ENCOUNTER — Inpatient Hospital Stay: Payer: Medicaid Other

## 2019-02-16 ENCOUNTER — Other Ambulatory Visit: Payer: Self-pay

## 2019-02-16 ENCOUNTER — Encounter: Payer: Self-pay | Admitting: Oncology

## 2019-02-16 ENCOUNTER — Inpatient Hospital Stay: Payer: Medicaid Other | Attending: Oncology | Admitting: Oncology

## 2019-02-16 VITALS — BP 115/67 | HR 82 | Temp 97.8°F | Resp 18 | Ht 69.0 in | Wt 128.5 lb

## 2019-02-16 DIAGNOSIS — Z85820 Personal history of malignant melanoma of skin: Secondary | ICD-10-CM | POA: Diagnosis not present

## 2019-02-16 DIAGNOSIS — C3490 Malignant neoplasm of unspecified part of unspecified bronchus or lung: Secondary | ICD-10-CM

## 2019-02-16 DIAGNOSIS — C3411 Malignant neoplasm of upper lobe, right bronchus or lung: Secondary | ICD-10-CM

## 2019-02-16 DIAGNOSIS — Z79899 Other long term (current) drug therapy: Secondary | ICD-10-CM

## 2019-02-16 DIAGNOSIS — Z886 Allergy status to analgesic agent status: Secondary | ICD-10-CM | POA: Insufficient documentation

## 2019-02-16 DIAGNOSIS — M1990 Unspecified osteoarthritis, unspecified site: Secondary | ICD-10-CM | POA: Insufficient documentation

## 2019-02-16 DIAGNOSIS — Z885 Allergy status to narcotic agent status: Secondary | ICD-10-CM | POA: Diagnosis not present

## 2019-02-16 DIAGNOSIS — Z87442 Personal history of urinary calculi: Secondary | ICD-10-CM | POA: Insufficient documentation

## 2019-02-16 DIAGNOSIS — J449 Chronic obstructive pulmonary disease, unspecified: Secondary | ICD-10-CM | POA: Diagnosis not present

## 2019-02-16 DIAGNOSIS — F17210 Nicotine dependence, cigarettes, uncomplicated: Secondary | ICD-10-CM | POA: Diagnosis not present

## 2019-02-16 DIAGNOSIS — G8929 Other chronic pain: Secondary | ICD-10-CM

## 2019-02-16 LAB — CBC WITH DIFFERENTIAL/PLATELET
Abs Immature Granulocytes: 0.02 10*3/uL (ref 0.00–0.07)
Basophils Absolute: 0 10*3/uL (ref 0.0–0.1)
Basophils Relative: 1 %
Eosinophils Absolute: 0.2 10*3/uL (ref 0.0–0.5)
Eosinophils Relative: 3 %
HCT: 38.2 % — ABNORMAL LOW (ref 39.0–52.0)
Hemoglobin: 12.6 g/dL — ABNORMAL LOW (ref 13.0–17.0)
Immature Granulocytes: 0 %
Lymphocytes Relative: 25 %
Lymphs Abs: 1.7 10*3/uL (ref 0.7–4.0)
MCH: 30.1 pg (ref 26.0–34.0)
MCHC: 33 g/dL (ref 30.0–36.0)
MCV: 91.2 fL (ref 80.0–100.0)
Monocytes Absolute: 0.6 10*3/uL (ref 0.1–1.0)
Monocytes Relative: 9 %
Neutro Abs: 4.3 10*3/uL (ref 1.7–7.7)
Neutrophils Relative %: 62 %
Platelets: 229 10*3/uL (ref 150–400)
RBC: 4.19 MIL/uL — ABNORMAL LOW (ref 4.22–5.81)
RDW: 13.3 % (ref 11.5–15.5)
WBC: 6.8 10*3/uL (ref 4.0–10.5)
nRBC: 0 % (ref 0.0–0.2)

## 2019-02-16 LAB — COMPREHENSIVE METABOLIC PANEL
ALT: 9 U/L (ref 0–44)
AST: 15 U/L (ref 15–41)
Albumin: 3.8 g/dL (ref 3.5–5.0)
Alkaline Phosphatase: 59 U/L (ref 38–126)
Anion gap: 9 (ref 5–15)
BUN: 13 mg/dL (ref 8–23)
CO2: 25 mmol/L (ref 22–32)
Calcium: 8.8 mg/dL — ABNORMAL LOW (ref 8.9–10.3)
Chloride: 105 mmol/L (ref 98–111)
Creatinine, Ser: 1.03 mg/dL (ref 0.61–1.24)
GFR calc Af Amer: >60 mL/min (ref >60)
GFR calc non Af Amer: >60 mL/min (ref >60)
Glucose, Bld: 123 mg/dL — ABNORMAL HIGH (ref 70–99)
Potassium: 4 mmol/L (ref 3.5–5.1)
Sodium: 139 mmol/L (ref 135–145)
Total Bilirubin: 0.4 mg/dL (ref 0.3–1.2)
Total Protein: 6.9 g/dL (ref 6.5–8.1)

## 2019-02-16 NOTE — Progress Notes (Signed)
Patient here today for follow up and review recent CT results.  Patient states no new concerns today.

## 2019-02-18 NOTE — Progress Notes (Signed)
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  INTERVAL HISTORY: Patient returns to clinic today for routine 43-month evaluation and discussion of his imaging results.  He currently feels well and is asymptomatic.  He has no neurologic complaints.  He denies any recent fevers or illnesses.  He has a good appetite and denies weight loss.  He denies any chest pain, shortness of breath, cough, or hemoptysis. He denies any nausea, vomiting, constipation, or diarrhea. He has no urinary complaints.  Patient feels at his baseline offers no specific complaints today.  REVIEW OF SYSTEMS:   Review of Systems  Constitutional: Negative.  Negative for fever, malaise/fatigue and weight loss.  HENT: Negative for tinnitus.   Respiratory: Negative.  Negative for cough, hemoptysis and shortness of breath.   Cardiovascular: Negative.  Negative for chest pain and leg swelling.  Gastrointestinal: Negative.  Negative for abdominal pain.  Genitourinary: Negative.  Negative for dysuria.  Musculoskeletal: Negative.  Negative for joint pain and myalgias.  Skin: Negative.  Negative for rash.  Neurological: Negative.  Negative for sensory change, focal weakness, weakness and headaches.  Psychiatric/Behavioral: Negative.  The patient is not nervous/anxious.     As per HPI. Otherwise, a complete review of systems is negative.  PAST MEDICAL HISTORY: Past Medical History:  Diagnosis Date   Arthritis    Asthma    COPD (chronic obstructive pulmonary disease) ([place])    not on home o2   Gout    History of kidney stones    History of melanoma    Hypertension    Oxygen deficiency    O2 1 LITER [facility]   Pain    chronic pain all over   Small cell lung cancer  ([place])    melanoma   Tobacco use     PAST SURGICAL HISTORY: Past Surgical History:  Procedure Laterality Date   ELECTROMAGNETIC NAVIGATION BROCHOSCOPY N/A 07/22/2016   Procedure: ELECTROMAGNETIC NAVIGATION BRONCHOSCOPY;  Surgeon: Flora Lipps, MD;  Location: [facility] ORS;  Service: Cardiopulmonary;  Laterality: N/A;   PERIPHERAL VASCULAR CATHETERIZATION N/A 09/15/2015   Procedure: Glori Luis Cath Insertion;  Surgeon: Algernon Huxley, MD;  Location: [facility];  Service: Cardiovascular;  Laterality: N/A;   PORTA CATH REMOVAL N/A 05/05/2017   Procedure: Glori Luis Cath Removal;  Surgeon: Algernon Huxley, MD;  Location: [facility];  Service: Cardiovascular;  Laterality: N/A;   SKIN GRAFT     [facility]    FAMILY HISTORY: Reviewed and unchanged. No reported history of malignancy or chronic disease.     ADVANCED DIRECTIVES:    HEALTH MAINTENANCE: Social History   Tobacco Use   Smoking status: Current Every Day Smoker    Packs/day: 1.00    Years: 51.00    Pack years: 51.00    Start date: 08/12/1994   Smokeless tobacco: Never Used   Tobacco comment: used to smoke upto 2PPD, recently cut down  Substance Use Topics   Alcohol use: No    Alcohol/week: 0.0 standard drinks   Drug use: No    Comment: approx. a month quit     Colonoscopy:  PAP:  Bone density:  Lipid panel:  Allergies  Allergen Reactions   Tylenol [Acetaminophen] Other (See Comments)    Unable to take due to  chemotherapy regimen   Vicodin [Hydrocodone-Acetaminophen] Nausea And Vomiting and Rash    Current Outpatient Medications  Medication Sig Dispense Refill   baclofen (LIORESAL) 10 MG tablet TAKE 0.5-1 TABLETS (5-10 MG TOTAL) BY MOUTH 3 (THREE) TIMES DAILY AS NEEDED FOR MUSCLE SPASMS. 30 tablet 2   feeding supplement, ENSURE ENLIVE, (ENSURE ENLIVE) LIQD Take 237 mLs by mouth 3 (three) times daily between meals. 237 mL 12   gabapentin (NEURONTIN) 300 MG capsule TAKE 1 CAPSULE BY MOUTH THREE TIMES A DAY 90  capsule 3   loperamide (IMODIUM A-D) 2 MG tablet Take 1 tablet (2 mg total) by mouth 4 (four) times daily as needed for diarrhea or loose stools. 30 tablet 0   ondansetron (ZOFRAN ODT) 4 MG disintegrating tablet Take 1 tablet (4 mg total) by mouth every 8 (eight) hours as needed for nausea or vomiting. 20 tablet 0   pantoprazole (PROTONIX) 40 MG tablet TAKE 1 TABLET BY MOUTH EVERY DAY 90 tablet 1   PROAIR HFA 108 (90 Base) MCG/ACT inhaler TAKE 2 PUFFS BY MOUTH EVERY 6 HOURS AS NEEDED FOR WHEEZE OR SHORTNESS OF BREATH 8.5 Inhaler 2   SPIRIVA HANDIHALER 18 MCG inhalation capsule INHALE 1 CAPSULE VIA HANDIHALER ONCE DAILY AT THE SAME TIME EVERY DAY 30 capsule 5   No current facility-administered medications for this visit.     OBJECTIVE: Vitals:   02/16/19 1128  BP: 115/67  Pulse: 82  Resp: 18  Temp: 97.8 F (36.6 C)  SpO2: 97%     Body mass index is 18.98 kg/m.    ECOG FS:0 - Asymptomatic  General: Well-developed, well-nourished, no acute distress. Eyes: Pink conjunctiva, anicteric sclera. HEENT: Normocephalic, moist mucous membranes. Lungs: Clear to auscultation bilaterally. Heart: Regular rate and rhythm. No rubs, murmurs, or gallops. Abdomen: Soft, nontender, nondistended. No organomegaly noted, normoactive bowel sounds. Musculoskeletal: No edema, cyanosis, or clubbing. Neuro: Alert, answering all questions appropriately. Cranial nerves grossly intact. Skin: No rashes or petechiae noted. Psych: Normal affect.  LAB RESULTS:  Lab Results  Component Value Date   NA 139 02/16/2019   K 4.0 02/16/2019   CL 105 02/16/2019   CO2 25 02/16/2019   GLUCOSE 123 (H) 02/16/2019   BUN 13 02/16/2019   CREATININE 1.03 02/16/2019   CALCIUM 8.8 (L) 02/16/2019   PROT 6.9 02/16/2019   ALBUMIN 3.8 02/16/2019   AST 15 02/16/2019   ALT 9 02/16/2019   ALKPHOS 59 02/16/2019   BILITOT 0.4 02/16/2019   GFRNONAA >60 02/16/2019   GFRAA >60 02/16/2019    Lab Results  Component Value  Date   WBC 6.8 02/16/2019   NEUTROABS 4.3 02/16/2019   HGB 12.6 (L) 02/16/2019   HCT 38.2 (L) 02/16/2019   MCV 91.2 02/16/2019   PLT 229 02/16/2019     STUDIES: Ct Chest W Contrast  Result Date: 02/12/2019 CLINICAL DATA:  Right upper lobe small cell lung cancer diagnosed 2017. Restaging. EXAM: CT CHEST WITH CONTRAST TECHNIQUE: Multidetector CT imaging of the chest was performed during intravenous contrast administration. CONTRAST:  6mL OMNIPAQUE IOHEXOL 300 MG/ML  SOLN COMPARISON:  08/11/2026 chest CT. FINDINGS: Cardiovascular: Normal heart size. No significant pericardial effusion/thickening. Left anterior descending coronary atherosclerosis. Atherosclerotic nonaneurysmal thoracic aorta. Normal caliber pulmonary arteries. No central pulmonary emboli. Mediastinum/Nodes: No discrete thyroid nodules. Unremarkable esophagus. Mildly enlarged left axillary nodes, largest 1.0 cm (series 2/image 15), stable. No pathologically enlarged right axillary nodes. No pathologically enlarged mediastinal nodes. Right hilar soft tissue measures 1.0 cm short axis diameter (  series 2/image 82), stable. No left hilar adenopathy. Lungs/Pleura: No pneumothorax. No pleural effusion. Severe centrilobular emphysema. Right upper lobe irregular 3.5 x 2.5 cm mass with scattered internal coarse calcifications (series 3/image 66), previously 3.5 x 2.5 cm using similar measurement technique, stable. Numerous calcified granulomas scattered in both lungs, unchanged. Several solid pulmonary nodules scattered in both lungs, largest 9 mm in the anterior left lower lobe (series 3/image 89), all stable. No new significant pulmonary nodules. Upper abdomen: No acute abnormality. Musculoskeletal:  No aggressive appearing focal osseous lesions. IMPRESSION: 1. Stable right upper lobe irregular partially calcified lung mass, compatible with treated tumor. 2. No findings suspicious for recurrent metastatic disease. Stable right hilar soft tissue  compatible with treated nodal disease. 3. Mild left axillary lymphadenopathy is stable, nonspecific, presumably reactive. 4. Stable numerous solid subcentimeter pulmonary nodules in both lungs. Aortic Atherosclerosis (ICD10-I70.0) and Emphysema (ICD10-J43.9). Electronically Signed   By: Ilona Sorrel M.D.   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  PLAN:    1. 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  CT scan results from February 12, 2019 reviewed independently and reported as above with no obvious evidence of progressive or recurrent disease.  The right upper lobe nodule remains chronic and unchanged.  There are no new significant pulmonary nodules.  Patient is now greater than 3 years removed from completing treatment and can be transitioned to yearly evaluation with imaging.  Return to clinic in 1 year to discuss the results. 2.  Pain: Patient does not complain of pain today.  Given patient's history of substance abuse, he will not be given narcotics from this clinic.   3. History of melanoma: Patient is a poor historian but appears to have been greater than 5 years ago. Monitor.  I spent a total of 20 minutes face-to-face with the patient of which greater than 50% of the visit was spent in counseling and coordination of care as detailed above.   Patient expressed understanding and was in agreement with this plan. He also understands that He can call clinic at any time with any questions, concerns, or complaints.   Lloyd Huger, MD   02/18/2019 9:33 AM

## 2019-04-10 ENCOUNTER — Other Ambulatory Visit: Payer: Self-pay | Admitting: Family Medicine

## 2019-04-10 DIAGNOSIS — M159 Polyosteoarthritis, unspecified: Secondary | ICD-10-CM

## 2019-04-10 DIAGNOSIS — R630 Anorexia: Secondary | ICD-10-CM

## 2019-04-10 DIAGNOSIS — J432 Centrilobular emphysema: Secondary | ICD-10-CM

## 2019-04-10 DIAGNOSIS — C3490 Malignant neoplasm of unspecified part of unspecified bronchus or lung: Secondary | ICD-10-CM

## 2019-04-10 DIAGNOSIS — K219 Gastro-esophageal reflux disease without esophagitis: Secondary | ICD-10-CM

## 2019-04-10 MED ORDER — SPIRIVA HANDIHALER 18 MCG IN CAPS: ORAL_CAPSULE | RESPIRATORY_TRACT | 1 refills | Status: DC

## 2019-04-10 MED ORDER — GABAPENTIN 300 MG PO CAPS: 300.0000 mg | ORAL_CAPSULE | Freq: Three times a day (TID) | ORAL | 1 refills | Status: DC

## 2019-04-10 MED ORDER — PANTOPRAZOLE SODIUM 40 MG PO TBEC: 40.0000 mg | DELAYED_RELEASE_TABLET | Freq: Every day | ORAL | 1 refills | Status: DC

## 2019-05-15 ENCOUNTER — Telehealth: Payer: Self-pay | Admitting: Internal Medicine

## 2019-05-15 NOTE — Telephone Encounter (Signed)
Received oxygen documentation request from [place].  Pt is past due for appointment, as he was due for f/u 11/2018. Pt will need a OV and qualifying walk.  Pt has been scheduled for ov 05/25/2019. nothing further is needed.

## 2019-05-17 ENCOUNTER — Emergency Department
Admission: EM | Admit: 2019-05-17 | Discharge: 2019-05-17 | Disposition: A | Discharge status: Home / Self Care | Payer: Medicaid Other | Attending: Emergency Medicine | Admitting: Emergency Medicine

## 2019-05-17 ENCOUNTER — Encounter: Payer: Self-pay | Admitting: *Deleted

## 2019-05-17 ENCOUNTER — Other Ambulatory Visit: Payer: Self-pay

## 2019-05-17 DIAGNOSIS — Y929 Unspecified place or not applicable: Secondary | ICD-10-CM | POA: Diagnosis not present

## 2019-05-17 DIAGNOSIS — Z85118 Personal history of other malignant neoplasm of bronchus and lung: Secondary | ICD-10-CM | POA: Insufficient documentation

## 2019-05-17 DIAGNOSIS — L03115 Cellulitis of right lower limb: Secondary | ICD-10-CM | POA: Insufficient documentation

## 2019-05-17 DIAGNOSIS — Z79899 Other long term (current) drug therapy: Secondary | ICD-10-CM | POA: Insufficient documentation

## 2019-05-17 DIAGNOSIS — S70361A Insect bite (nonvenomous), right thigh, initial encounter: Secondary | ICD-10-CM | POA: Diagnosis present

## 2019-05-17 DIAGNOSIS — Z85820 Personal history of malignant melanoma of skin: Secondary | ICD-10-CM | POA: Diagnosis not present

## 2019-05-17 DIAGNOSIS — J45909 Unspecified asthma, uncomplicated: Secondary | ICD-10-CM | POA: Insufficient documentation

## 2019-05-17 DIAGNOSIS — W57XXXA Bitten or stung by nonvenomous insect and other nonvenomous arthropods, initial encounter: Secondary | ICD-10-CM | POA: Diagnosis not present

## 2019-05-17 DIAGNOSIS — Y939 Activity, unspecified: Secondary | ICD-10-CM | POA: Diagnosis not present

## 2019-05-17 DIAGNOSIS — F17210 Nicotine dependence, cigarettes, uncomplicated: Secondary | ICD-10-CM | POA: Insufficient documentation

## 2019-05-17 DIAGNOSIS — I10 Essential (primary) hypertension: Secondary | ICD-10-CM | POA: Insufficient documentation

## 2019-05-17 DIAGNOSIS — Y999 Unspecified external cause status: Secondary | ICD-10-CM | POA: Insufficient documentation

## 2019-05-17 MED ORDER — DOXYCYCLINE HYCLATE 100 MG PO TBEC: 100.0000 mg | DELAYED_RELEASE_TABLET | Freq: Two times a day (BID) | ORAL | 0 refills | Status: AC

## 2019-05-17 NOTE — ED Provider Notes (Signed)
[facility] Emergency Department Provider Note  ____________________________________________  Time seen: Approximately 4:44 PM  I have reviewed the triage vital signs and the nursing notes.   HISTORY  Chief Complaint Wound Infection    HPI David [name] is a 63 y.o. male presents to the emergency department with a pruritic rash of the right upper leg that has been present for the past 2 to 3 months after being bitten by a tick.  Patient states that he "picks" at area frequently and has noticed some new redness and tenderness.  He denies headache, fever, body aches or diarrhea. No other alleviating measures have been attempted.         Past Medical History:  Diagnosis Date  . Arthritis   . Asthma   . COPD (chronic obstructive pulmonary disease) ([facility])    not on home o2  . Gout   . History of kidney stones   . History of melanoma   . Hypertension   . Oxygen deficiency    O2 1 LITER [facility]  . Pain    chronic pain all over  . Small cell lung cancer ([facility])    melanoma  . Tobacco use     Patient Active Problem List   Diagnosis Date Noted  . Severe protein-calorie malnutrition ([place]) 10/27/2017  . Hypertension 04/26/2017  . Chronic pain syndrome 09/28/2016  . Long term current use of opiate analgesic 09/28/2016  . Long term prescription opiate use 09/28/2016  . Disturbance of skin sensation 09/28/2016  . Lung mass   . Anemia 07/19/2016  . Multiple lung nodules 07/19/2016  . Chest pain 07/18/2016  . Primary osteoarthritis involving multiple joints 06/23/2016  . Chronic bursitis of left shoulder 06/23/2016  . Chronic pain of both shoulders 06/23/2016  . History of substance abuse ([place]) 06/23/2016  . Tobacco abuse 04/20/2016  . History of melanoma 04/20/2016  . COPD (chronic obstructive pulmonary disease) ([place]) 04/20/2016  . Gout 04/20/2016  . Adjustment disorder 04/20/2016  . Insomnia 04/20/2016  . Chronic pain due to neoplasm 04/20/2016  .  Opiate use 04/20/2016  . Small cell lung cancer ([place]) 09/08/2015    Past Surgical History:  Procedure Laterality Date  . ELECTROMAGNETIC NAVIGATION BROCHOSCOPY N/A 07/22/2016   Procedure: ELECTROMAGNETIC NAVIGATION BRONCHOSCOPY;  Surgeon: Flora Lipps, MD;  Location: [facility] ORS;  Service: Cardiopulmonary;  Laterality: N/A;  . PERIPHERAL VASCULAR CATHETERIZATION N/A 09/15/2015   Procedure: Glori Luis Cath Insertion;  Surgeon: Algernon Huxley, MD;  Location: [facility];  Service: Cardiovascular;  Laterality: N/A;  . PORTA CATH REMOVAL N/A 05/05/2017   Procedure: Glori Luis Cath Removal;  Surgeon: Algernon Huxley, MD;  Location: [facility];  Service: Cardiovascular;  Laterality: N/A;  . SKIN GRAFT     [facility]    Prior to Admission medications   Medication Sig Start Date End Date Taking? Authorizing Provider  baclofen (LIORESAL) 10 MG tablet TAKE 0.5-1 TABLETS (5-10 MG TOTAL) BY MOUTH 3 (THREE) TIMES DAILY AS NEEDED FOR MUSCLE SPASMS. 01/19/19   Karamalegos, Devonne Doughty, DO  doxycycline (DORYX) 100 MG EC tablet Take 1 tablet (100 mg total) by mouth 2 (two) times daily for 14 days. 05/17/19 05/31/19  Lannie Fields, PA-C  feeding supplement, ENSURE ENLIVE, (ENSURE ENLIVE) LIQD Take 237 mLs by mouth 3 (three) times daily between meals. 07/19/16   Theodoro Grist, MD  gabapentin (NEURONTIN) 300 MG capsule Take 1 capsule (300 mg total) by mouth 3 (three) times daily. 04/10/19  Karamalegos, Devonne Doughty, DO  loperamide (IMODIUM A-D) 2 MG tablet Take 1 tablet (2 mg total) by mouth 4 (four) times daily as needed for diarrhea or loose stools. 09/04/18   Lavonia Drafts, MD  ondansetron (ZOFRAN ODT) 4 MG disintegrating tablet Take 1 tablet (4 mg total) by mouth every 8 (eight) hours as needed for nausea or vomiting. 09/04/18   Lavonia Drafts, MD  pantoprazole (PROTONIX) 40 MG tablet Take 1 tablet (40 mg total) by mouth daily before breakfast. 04/10/19   Parks Ranger, Devonne Doughty, DO  PROAIR HFA 108 (90 Base) MCG/ACT  inhaler TAKE 2 PUFFS BY MOUTH EVERY 6 HOURS AS NEEDED FOR WHEEZE OR SHORTNESS OF BREATH 01/19/19   Karamalegos, Devonne Doughty, DO  tiotropium (SPIRIVA HANDIHALER) 18 MCG inhalation capsule INHALE 1 CAPSULE VIA HANDIHALER ONCE DAILY AT THE SAME TIME EVERY DAY 04/10/19   Olin Hauser, DO    Allergies Tylenol [acetaminophen] and Vicodin [hydrocodone-acetaminophen]  Family History  Problem Relation Age of Onset  . Lung cancer Mother   . Heart attack Mother 31  . Cirrhosis Father 87    Social History Social History   Tobacco Use  . Smoking status: Current Every Day Smoker    Packs/day: 1.00    Years: 51.00    Pack years: 51.00    Types: Cigarettes    Start date: 08/12/1994  . Smokeless tobacco: Never Used  . Tobacco comment: used to smoke upto 2PPD, recently cut down  Substance Use Topics  . Alcohol use: No    Alcohol/week: 0.0 standard drinks  . Drug use: No    Comment: approx. a month quit     Review of Systems  Constitutional: No fever/chills Eyes: No visual changes. No discharge ENT: No upper respiratory complaints. Cardiovascular: no chest pain. Respiratory: no cough. No SOB. Gastrointestinal: No abdominal pain.  No nausea, no vomiting.  No diarrhea.  No constipation. Genitourinary: Negative for dysuria. No hematuria Musculoskeletal: Negative for musculoskeletal pain. Skin: Patient has rash. Neurological: Negative for headaches, focal weakness or numbness.  ____________________________________________   PHYSICAL EXAM:  VITAL SIGNS: ED Triage Vitals  Enc Vitals Group     BP 05/17/19 1519 111/62     Pulse Rate 05/17/19 1519 92     Resp 05/17/19 1519 18     Temp 05/17/19 1519 97.9 F (36.6 C)     Temp Source 05/17/19 1519 Oral     SpO2 05/17/19 1519 98 %     Weight 05/17/19 1520 128 lb (58.1 kg)     Height 05/17/19 1520 5\' 9"  (1.753 m)     Head Circumference --      Peak Flow --      Pain Score 05/17/19 1541 0     Pain Loc --      Pain Edu? --       Excl. in [place]? --      Constitutional: Alert and oriented. Well appearing and in no acute distress. Eyes: Conjunctivae are normal. PERRL. EOMI. Head: Atraumatic. Cardiovascular: Normal rate, regular rhythm. Normal S1 and S2.  Good peripheral circulation. Respiratory: Normal respiratory effort without tachypnea or retractions. Lungs CTAB. Good air entry to the bases with no decreased or absent breath sounds. Gastrointestinal: Bowel sounds 4 quadrants. Soft and nontender to palpation. No guarding or rigidity. No palpable masses. No distention. No CVA tenderness. Musculoskeletal: Full range of motion to all extremities. No gross deformities appreciated. Neurologic:  Normal speech and language. No gross focal neurologic deficits are appreciated.  Skin:  Affected skin appears hyperkeratotic with no appreciable fluctuance.  There is approximately 3 cm of surrounding cellulitis Psychiatric: Mood and affect are normal. Speech and behavior are normal. Patient exhibits appropriate insight and judgement.   ____________________________________________   LABS (all labs ordered are listed, but only abnormal results are displayed)  Labs Reviewed - No data to display ____________________________________________  EKG   ____________________________________________  RADIOLOGY  No results found.  ____________________________________________    PROCEDURES  Procedure(s) performed:    Procedures    Medications - No data to display   ____________________________________________   INITIAL IMPRESSION / ASSESSMENT AND PLAN / ED COURSE  Pertinent labs & imaging results that were available during my care of the patient were reviewed by me and considered in my medical decision making (see chart for details).  Review of the [place] CSRS was performed in accordance of the [place] prior to dispensing any controlled drugs.          Assessment and plan Rash 63 year old male presents to the  emergency department with a rash that is been present for the past 3 months after a tick bite.  Vital signs were reassuring at triage.  On physical exam, affected area appeared hyperkeratotic with surrounding cellulitis.  There was no appreciable induration or palpable fluctuance that would suggest drainable fluid collection.  I started patient on doxycycline empirically and advised him to follow-up with primary care if symptoms persist.  Return precautions were given.  All patient questions were answered.    ____________________________________________  FINAL CLINICAL IMPRESSION(S) / ED DIAGNOSES  Final diagnoses:  Tick bite, initial encounter  Cellulitis of right lower extremity      NEW MEDICATIONS STARTED DURING THIS VISIT:  ED Discharge Orders         Ordered    doxycycline (DORYX) 100 MG EC tablet  2 times daily     05/17/19 1611              This chart was dictated using voice recognition software/Dragon. Despite best efforts to proofread, errors can occur which can change the meaning. Any change was purely unintentional.    Lannie Fields, PA-C 05/17/19 1723    Nena Polio, MD 05/18/19 2135

## 2019-05-17 NOTE — ED Notes (Signed)
See triage note  States he noticed a tick bite to right upper leg several months ago    Has raised area   No drainage   States area is sore when he rolls over on it

## 2019-05-17 NOTE — ED Triage Notes (Addendum)
Pt was bitten by a tick x 5 months ago on right. Pt has wound on right thigh where tick was removed. Pt states he thought it would get better. Pt c/o worse pain and cannot rest on R side. Pt denies drainage from wound.

## 2019-05-25 ENCOUNTER — Ambulatory Visit (INDEPENDENT_AMBULATORY_CARE_PROVIDER_SITE_OTHER): Payer: Medicaid Other | Admitting: Internal Medicine

## 2019-05-25 ENCOUNTER — Encounter: Payer: Self-pay | Admitting: Internal Medicine

## 2019-05-25 ENCOUNTER — Other Ambulatory Visit: Payer: Self-pay

## 2019-05-25 VITALS — BP 110/68 | HR 93 | Temp 98.1°F | Ht 69.0 in | Wt 130.0 lb

## 2019-05-25 DIAGNOSIS — J449 Chronic obstructive pulmonary disease, unspecified: Secondary | ICD-10-CM | POA: Diagnosis not present

## 2019-05-25 DIAGNOSIS — F17210 Nicotine dependence, cigarettes, uncomplicated: Secondary | ICD-10-CM | POA: Diagnosis not present

## 2019-05-25 DIAGNOSIS — J9611 Chronic respiratory failure with hypoxia: Secondary | ICD-10-CM

## 2019-05-25 DIAGNOSIS — Z720 Tobacco use: Secondary | ICD-10-CM

## 2019-05-25 MED ORDER — NICOTINE 21 MG/24HR TD PT24: 21.0000 mg | MEDICATED_PATCH | TRANSDERMAL | 1 refills | Status: DC

## 2019-05-25 NOTE — Patient Instructions (Addendum)
Please STOP smoking!  SPIRIVA as prescribed

## 2019-05-25 NOTE — Progress Notes (Signed)
[facility] Consultation        CHIEF COMPLAINT:   Follow-up COPD   HISTORY OF PRESENT ILLNESS   Previous FEV1 69% predicted in 2017 Diagnosis of moderate COPD Patient currently smokes 1 pack a day   Smoking Assessment and Cessation Counseling   Upon further questioning, Patient smokes 1 ppd  I have advised patient to quit/stop smoking as soon as possible due to high risk for multiple medical problems  Patient  is NOT willing to quit smoking  I have advised patient that we can assist and have options of Nicotine replacement therapy. 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 CT chest pending  Chronic shortness of breath and dyspnea exertion with intermittent wheezing for many years Chronic cough and chronic chest congestion Uses oxygen intermittently  No COPD exacerbation at this time No evidence of heart failure at this time No evidence or signs of infection at this time No respiratory distress No fevers, chills, nausea, vomiting, diarrhea No evidence of lower extremity edema No evidence hemoptysis   Patient is thin and cachectic Previous cocaine use   Patient is supposed to be wearing his oxygen at night and with exertion At this time he adamantly refuses to wear his oxygen therapy I have explained the risks and benefits of oxygen If he does require oxygen therapy and he does not use it he is at high risk for cardiac arrest stroke and death.  Patient understands the risks  He would like the oxygen to be discontinued     REVIEW OF SYSTEMS   Review of Systems  Constitutional: Positive for malaise/fatigue and weight loss. Negative for chills, diaphoresis  and fever.  HENT: Positive for congestion. Negative for hearing loss.   Eyes: Negative for blurred vision and double vision.  Respiratory: Positive for cough, shortness of breath and wheezing. Negative for hemoptysis and sputum production.   Cardiovascular: Negative for chest pain, palpitations, orthopnea and leg swelling.  Gastrointestinal: Negative for abdominal pain, heartburn, nausea and vomiting.  Musculoskeletal: Positive for joint pain. Negative for back pain, myalgias and neck pain.  Skin: Negative for rash.  Neurological: Negative for dizziness and weakness.  Psychiatric/Behavioral: Negative for depression.  All other systems reviewed and are negative.   BP 110/68   Pulse 93   Temp 98.1 F (36.7 C) (Temporal)   Ht 5\' 9"  (1.753 m)   Wt 130 lb (59 kg)   SpO2 94%   BMI 19.20 kg/m     PHYSICAL EXAM  Physical Exam  Constitutional: He is oriented to person, place, and time. No distress.  Thin, pale and cachectic, sickly looking  HENT:  Head: Normocephalic and atraumatic.  Mouth/Throat: No oropharyngeal exudate.  Eyes: Pupils are equal, round, and reactive to light. EOM are normal. No scleral icterus.  Neck: Neck supple.  Cardiovascular: Normal rate, regular rhythm and normal heart sounds.  No murmur heard. Pulmonary/Chest: No respiratory distress. He has no wheezes. He has no rales.  Abdominal: Soft. Bowel sounds are normal.  Musculoskeletal: Normal range of motion.        General: No edema.  Neurological: He is alert and oriented to person, place, and time. No cranial nerve deficit.  Skin: Skin is  warm. He is not diaphoretic.  Psychiatric: He has a normal mood and affect.       IMAGING  CT chest 05/2016 Spiculated right upper lobe 1.7 x 1.3 cm pulmonary nodule  previously 1.5 x 1.1 cm on 08/27/2017hest  Findings c/w recurrent cancer vs new primary cancer  ENB 12/17 Negative      ASSESSMENT/PLAN   63 year old thin white male with moderate COPD Gold stage  B History of right upper lobe lung nodule diagnosed with stage II small cell lung cancer With a history of cocaine  Moderate COPD Gold stage B COPD seems to be stable at this time Patient is to continue on inhalers as prescribed Albuterol as needed  Chronic hypoxic respiratory failure from COPD Patient uses oxygen intermittently but I have advised that he needs to use it as prescribed Patient needs and benefits from oxygen therapy Patient at this time refuses adamantly that he does not want to wear his oxygen I have explained to him the risk of arrhythmias congestive failure stroke and death    Stage II small lung cancer Follow-up oncology and repeat CT chest next year  Smoking cessation strongly advised We will prescribe nicotine patches  COVID-19 EDUCATION: The signs and symptoms of COVID-19 were discussed with the patient and how to seek care for testing.  The importance of social distancing was discussed today. Hand Washing Techniques and avoid touching face was advised.     MEDICATION ADJUSTMENTS/LABS AND TESTS ORDERED: Continue Spiriva as prescribed Recommend smoking cessation I strongly recommend oxygen therapy but patient refuses    CURRENT MEDICATIONS REVIEWED AT LENGTH WITH PATIENT TODAY   Patient satisfied with Plan of action and management. All questions answered  Follow up in 6 months   Dalaya Suppa Patricia Pesa, M.D.  [facility]  Medical Director [place] Director [facility] Cardio-Pulmonary Department

## 2020-01-01 ENCOUNTER — Telehealth: Payer: Self-pay | Admitting: *Deleted

## 2020-01-01 NOTE — Telephone Encounter (Signed)
Patient has not been seen or evaluated since July 2020.  He is now on a yearly imaging and evaluation for his small cell lung cancer.  We can move up his CT scan with evaluation 1 to 2 days later if we can get a hold of him, otherwise patient should go to emergency room.

## 2020-01-01 NOTE — Telephone Encounter (Signed)
Patient called and left message that he is "spitting up blood" I attempted to call patient back for more information and got his voice mail. I left message for him to return my call to find out if It is vomiting or coughing up blood, when it started and how much. I will await his return call.

## 2020-01-02 NOTE — Telephone Encounter (Signed)
Called and spoke with patient. Reports that starting Monday sometimes when he coughs he coughs up a small amount of dark red blood. Denies any chest pain, shortness of breath or difficulty breathing. Offered him the option of moving his CT scan with MD appt 1-2 days later to earlier, and patient said he would like that. Scheduling message sent.

## 2020-01-04 NOTE — Progress Notes (Signed)
[place]  [phone] [phone]  ID: David [name] OB: [birth year]  MR#: [id number]  LPF#:[id number]  Patient Care Team: Olin Hauser, DO as PCP - General (Family Medicine)  CHIEF COMPLAINT: Stage IIa small cell carcinoma of the right lung.  INTERVAL HISTORY: Patient returns to clinic today as an add-on after complaining of a recent episode of hemoptysis.  This has since resolved and he currently feels well and is asymptomatic. He has no neurologic complaints.  He denies any recent fevers or illnesses.  He has a good appetite and denies weight loss.  He denies any chest pain, shortness of breath, cough, or further hemoptysis. He denies any nausea, vomiting, constipation, or diarrhea. He has no urinary complaints.  Patient offers no further specific complaints today.  REVIEW OF SYSTEMS:   Review of Systems  Constitutional: Negative.  Negative for fever, malaise/fatigue and weight loss.  HENT: Negative for tinnitus.   Respiratory: Negative.  Negative for cough, hemoptysis and shortness of breath.   Cardiovascular: Negative.  Negative for chest pain and leg swelling.  Gastrointestinal: Negative.  Negative for abdominal pain.  Genitourinary: Negative.  Negative for dysuria.  Musculoskeletal: Negative.  Negative for joint pain and myalgias.  Skin: Negative.  Negative for rash.  Neurological: Negative.  Negative for sensory change, focal weakness, weakness and headaches.  Psychiatric/Behavioral: Negative.  The patient is not nervous/anxious.     As per HPI. Otherwise, a complete review of systems is negative.  PAST MEDICAL HISTORY: Past Medical History:  Diagnosis Date  . Arthritis   . Asthma   . COPD (chronic obstructive pulmonary disease) (HCC)    not on home o2  . Gout   . History of kidney stones   . History of melanoma   . Hypertension   . Oxygen deficiency    O2 1 LITER [place] [facility]  . Pain    chronic pain all over  . Small  cell lung cancer (HCC)    melanoma  . Tobacco use     PAST SURGICAL HISTORY: Past Surgical History:  Procedure Laterality Date  . ELECTROMAGNETIC NAVIGATION BROCHOSCOPY N/A 07/22/2016   Procedure: ELECTROMAGNETIC NAVIGATION BRONCHOSCOPY;  Surgeon: Flora Lipps, MD;  Location: [facility] ORS;  Service: Cardiopulmonary;  Laterality: N/A;  . PERIPHERAL VASCULAR CATHETERIZATION N/A 09/15/2015   Procedure: Glori Luis Cath Insertion;  Surgeon: Algernon Huxley, MD;  Location: [place] CV LAB;  Service: Cardiovascular;  Laterality: N/A;  . PORTA CATH REMOVAL N/A 05/05/2017   Procedure: Glori Luis Cath Removal;  Surgeon: Algernon Huxley, MD;  Location: [place] [facility];  Service: Cardiovascular;  Laterality: N/A;  . SKIN GRAFT     [facility]    FAMILY HISTORY: Reviewed and unchanged. No reported history of malignancy or chronic disease.     ADVANCED DIRECTIVES:    HEALTH MAINTENANCE: Social History   Tobacco Use  . Smoking status: Current Every Day Smoker    Packs/day: 1.00    Years: 51.00    Pack years: 51.00    Types: Cigarettes    Start date: 08/12/1994  . Smokeless tobacco: Never Used  . Tobacco comment: used to smoke upto 2PPD, recently cut down  Substance Use Topics  . Alcohol use: No    Alcohol/week: 0.0 standard drinks  . Drug use: No    Comment: approx. a month quit     Colonoscopy:  PAP:  Bone density:  Lipid panel:  Allergies  Allergen Reactions  . Tylenol [Acetaminophen] Other (See Comments)  Unable to take due to chemotherapy regimen  . Vicodin [Hydrocodone-Acetaminophen] Nausea And Vomiting and Rash    Current Outpatient Medications  Medication Sig Dispense Refill  . baclofen (LIORESAL) 10 MG tablet TAKE 0.5-1 TABLETS (5-10 MG TOTAL) BY MOUTH 3 (THREE) TIMES DAILY AS NEEDED FOR MUSCLE SPASMS. (Patient not taking: Reported on 01/11/2020) 30 tablet 2  . feeding supplement, ENSURE ENLIVE, (ENSURE ENLIVE) LIQD Take 237 mLs by mouth 3 (three) times daily between [name]. (Patient not  taking: Reported on 01/11/2020) 237 mL 12  . gabapentin (NEURONTIN) 300 MG capsule Take 1 capsule (300 mg total) by mouth 3 (three) times daily. (Patient not taking: Reported on 01/11/2020) 270 capsule 1  . loperamide (IMODIUM A-D) 2 MG tablet Take 1 tablet (2 mg total) by mouth 4 (four) times daily as needed for diarrhea or loose stools. (Patient not taking: Reported on 01/11/2020) 30 tablet 0  . nicotine (NICODERM CQ - DOSED IN MG/24 HOURS) 21 mg/24hr patch Place 1 patch (21 mg total) onto the skin daily. (Patient not taking: Reported on 01/11/2020) 30 patch 1  . ondansetron (ZOFRAN ODT) 4 MG disintegrating tablet Take 1 tablet (4 mg total) by mouth every 8 (eight) hours as needed for nausea or vomiting. (Patient not taking: Reported on 01/11/2020) 20 tablet 0  . pantoprazole (PROTONIX) 40 MG tablet Take 1 tablet (40 mg total) by mouth daily before breakfast. (Patient not taking: Reported on 01/11/2020) 90 tablet 1  . PROAIR HFA 108 (90 Base) MCG/ACT inhaler TAKE 2 PUFFS BY MOUTH EVERY 6 HOURS AS NEEDED FOR WHEEZE OR SHORTNESS OF BREATH (Patient not taking: Reported on 01/11/2020) 8.5 Inhaler 2  . tiotropium (SPIRIVA HANDIHALER) 18 MCG inhalation capsule INHALE 1 CAPSULE VIA HANDIHALER ONCE DAILY AT THE SAME TIME EVERY DAY (Patient not taking: Reported on 01/11/2020) 90 capsule 1   No current facility-administered medications for this visit.    OBJECTIVE: Vitals:   01/11/20 1431  BP: 111/71  Pulse: 85  Resp: 20  Temp: 97.8 F (36.6 C)  SpO2: 100%     Body mass index is 18.49 kg/m.    ECOG FS:0 - Asymptomatic  General: Well-developed, well-nourished, no acute distress. Eyes: Pink conjunctiva, anicteric sclera. HEENT: Normocephalic, moist mucous membranes. Lungs: No audible wheezing or coughing. Heart: Regular rate and rhythm. Abdomen: Soft, nontender, no obvious distention. Musculoskeletal: No edema, cyanosis, or clubbing. Neuro: Alert, answering all questions appropriately. Cranial nerves grossly  intact. Skin: No rashes or petechiae noted. Psych: Normal affect.  LAB RESULTS:  Lab Results  Component Value Date   NA 139 02/16/2019   K 4.0 02/16/2019   CL 105 02/16/2019   CO2 25 02/16/2019   GLUCOSE 123 (H) 02/16/2019   BUN 13 02/16/2019   CREATININE 1.00 01/10/2020   CALCIUM 8.8 (L) 02/16/2019   PROT 6.9 02/16/2019   ALBUMIN 3.8 02/16/2019   AST 15 02/16/2019   ALT 9 02/16/2019   ALKPHOS 59 02/16/2019   BILITOT 0.4 02/16/2019   GFRNONAA >60 02/16/2019   GFRAA >60 02/16/2019    Lab Results  Component Value Date   WBC 6.8 02/16/2019   NEUTROABS 4.3 02/16/2019   HGB 12.6 (L) 02/16/2019   HCT 38.2 (L) 02/16/2019   MCV 91.2 02/16/2019   PLT 229 02/16/2019     STUDIES: CT CHEST W CONTRAST  Result Date: 01/11/2020 CLINICAL DATA:  64 year old male with history of small cell lung cancer. Restaging examination. EXAM: CT CHEST WITH CONTRAST TECHNIQUE: Multidetector CT imaging of the chest was  performed during intravenous contrast administration. CONTRAST:  30mL OMNIPAQUE IOHEXOL 300 MG/ML  SOLN COMPARISON:  Chest CT 02/12/2019. FINDINGS: Cardiovascular: Heart size is normal. There is no significant pericardial fluid, thickening or pericardial calcification. There is aortic atherosclerosis, as well as atherosclerosis of the great vessels of the mediastinum and the coronary arteries, including calcified atherosclerotic plaque in the left main, left anterior descending, left circumflex and right coronary arteries. Mediastinum/Nodes: No pathologically enlarged mediastinal or hilar lymph nodes. Esophagus is unremarkable in appearance. No axillary lymphadenopathy. Lungs/Pleura: Partially calcified mass-like area of architectural distortion in the right upper lobe measuring 3.2 x 2.3 cm (axial image 62 of series 3), similar to the prior examination. There are again numerous other smaller pulmonary nodules scattered throughout the lungs bilaterally, similar in size, number and pattern of  distribution to the prior examinations, likely benign. The exception are a few tiny pulmonary nodules in the inferior aspect of the right middle lobe measuring 2-3 mm in size on images 131-136 of series 3. No acute consolidative airspace disease. No pleural effusions. Diffuse bronchial wall thickening with moderate centrilobular and paraseptal emphysema. Upper Abdomen: Aortic atherosclerosis. Musculoskeletal: There are no aggressive appearing lytic or blastic lesions noted in the visualized portions of the skeleton. IMPRESSION: 1. Treated right upper lobe mass appears grossly stable compared to the prior examination, as do the majority of small pulmonary nodules scattered throughout the lungs bilaterally. The exception are a new cluster of 2-3 mm pulmonary nodules in the right middle lobe, as detailed above. These are nonspecific, and attention on routine follow-up imaging is recommended to ensure continued stability or regression. 2. Aortic atherosclerosis, in addition to left main and 3 vessel coronary artery disease. Please note that although the presence of coronary artery calcium documents the presence of coronary artery disease, the severity of this disease and any potential stenosis cannot be assessed on this non-gated CT examination. Assessment for potential risk factor modification, dietary therapy or pharmacologic therapy may be warranted, if clinically indicated. 3. Diffuse bronchial wall thickening with moderate centrilobular and paraseptal emphysema; imaging findings suggestive of underlying COPD. Aortic Atherosclerosis (ICD10-I70.0). Electronically Signed   By: Vinnie Langton M.D.   On: 01/11/2020 11:15    ASSESSMENT: Stage IIa small cell carcinoma of the right lung.  PLAN:    1. 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  CT scan results from January 10, 2020 reviewed independently and report as above with no obvious evidence of recurrent or  progressive disease.  No intervention is needed at this time.  Return to clinic in 1 year with repeat imaging and further evaluation.   2.  Pain: Patient does not complain of pain today.  Given patient's history of substance abuse, he will not be given narcotics from this clinic.   3. History of melanoma: Patient is a poor historian but appears to have been greater than 5 years ago. Monitor. 4.  Hemoptysis: Resolved.  CT scan results as above.  Patient expressed understanding and was in agreement with this plan. He also understands that He can call clinic at any time with any questions, concerns, or complaints.   Lloyd Huger, MD   01/13/2020 10:32 AM

## 2020-01-10 ENCOUNTER — Other Ambulatory Visit: Payer: Self-pay

## 2020-01-10 ENCOUNTER — Ambulatory Visit
Admission: RE | Admit: 2020-01-10 | Discharge: 2020-01-10 | Disposition: A | Discharge status: Home / Self Care | Payer: Medicaid Other | Source: Ambulatory Visit | Attending: Oncology | Admitting: Oncology

## 2020-01-10 DIAGNOSIS — C3490 Malignant neoplasm of unspecified part of unspecified bronchus or lung: Secondary | ICD-10-CM | POA: Diagnosis present

## 2020-01-10 LAB — POCT I-STAT CREATININE: Creatinine, Ser: 1 mg/dL (ref 0.61–1.24)

## 2020-01-10 MED ORDER — IOHEXOL 300 MG/ML  SOLN
75.0000 mL | Freq: Once | INTRAMUSCULAR | Status: AC | PRN
  Administered 2020-01-10: 75 mL via INTRAVENOUS

## 2020-01-11 ENCOUNTER — Encounter: Payer: Self-pay | Admitting: Oncology

## 2020-01-11 ENCOUNTER — Inpatient Hospital Stay: Payer: Medicaid Other | Attending: Oncology | Admitting: Oncology

## 2020-01-11 VITALS — BP 111/71 | HR 85 | Temp 97.8°F | Resp 20 | Wt 125.2 lb

## 2020-01-11 DIAGNOSIS — Z87442 Personal history of urinary calculi: Secondary | ICD-10-CM | POA: Insufficient documentation

## 2020-01-11 DIAGNOSIS — F17210 Nicotine dependence, cigarettes, uncomplicated: Secondary | ICD-10-CM | POA: Insufficient documentation

## 2020-01-11 DIAGNOSIS — Z85820 Personal history of malignant melanoma of skin: Secondary | ICD-10-CM | POA: Diagnosis not present

## 2020-01-11 DIAGNOSIS — C3490 Malignant neoplasm of unspecified part of unspecified bronchus or lung: Secondary | ICD-10-CM

## 2020-01-11 DIAGNOSIS — Z85118 Personal history of other malignant neoplasm of bronchus and lung: Secondary | ICD-10-CM | POA: Diagnosis not present

## 2020-01-11 DIAGNOSIS — I10 Essential (primary) hypertension: Secondary | ICD-10-CM | POA: Diagnosis not present

## 2020-01-11 DIAGNOSIS — J449 Chronic obstructive pulmonary disease, unspecified: Secondary | ICD-10-CM | POA: Diagnosis not present

## 2020-01-11 NOTE — Progress Notes (Signed)
Patient reports that he has stopped taking all medications. He is no longer having any issues coughing up blood. He denies any pain or concerns at this time.

## 2020-02-18 ENCOUNTER — Ambulatory Visit: Payer: Medicaid Other

## 2020-02-20 ENCOUNTER — Ambulatory Visit: Payer: Medicaid Other | Admitting: Oncology

## 2020-02-25 ENCOUNTER — Ambulatory Visit: Payer: Medicaid Other | Admitting: Oncology

## 2020-02-27 ENCOUNTER — Ambulatory Visit: Payer: Medicaid Other | Admitting: Oncology

## 2020-08-22 ENCOUNTER — Ambulatory Visit (INDEPENDENT_AMBULATORY_CARE_PROVIDER_SITE_OTHER): Payer: Medicaid Other | Admitting: Family Medicine

## 2020-08-22 ENCOUNTER — Encounter: Payer: Self-pay | Admitting: Family Medicine

## 2020-08-22 ENCOUNTER — Other Ambulatory Visit: Payer: Self-pay

## 2020-08-22 VITALS — BP 104/66 | HR 87 | Ht 69.0 in | Wt 127.0 lb

## 2020-08-22 DIAGNOSIS — M8949 Other hypertrophic osteoarthropathy, multiple sites: Secondary | ICD-10-CM

## 2020-08-22 DIAGNOSIS — E43 Unspecified severe protein-calorie malnutrition: Secondary | ICD-10-CM

## 2020-08-22 DIAGNOSIS — K219 Gastro-esophageal reflux disease without esophagitis: Secondary | ICD-10-CM

## 2020-08-22 DIAGNOSIS — C3490 Malignant neoplasm of unspecified part of unspecified bronchus or lung: Secondary | ICD-10-CM

## 2020-08-22 DIAGNOSIS — M159 Polyosteoarthritis, unspecified: Secondary | ICD-10-CM

## 2020-08-22 DIAGNOSIS — G893 Neoplasm related pain (acute) (chronic): Secondary | ICD-10-CM

## 2020-08-22 DIAGNOSIS — R630 Anorexia: Secondary | ICD-10-CM

## 2020-08-22 DIAGNOSIS — J432 Centrilobular emphysema: Secondary | ICD-10-CM | POA: Insufficient documentation

## 2020-08-22 DIAGNOSIS — F5101 Primary insomnia: Secondary | ICD-10-CM

## 2020-08-22 MED ORDER — ALBUTEROL SULFATE HFA 108 (90 BASE) MCG/ACT IN AERS: 2.0000 | INHALATION_SPRAY | Freq: Four times a day (QID) | RESPIRATORY_TRACT | 5 refills | Status: DC | PRN

## 2020-08-22 MED ORDER — MIRTAZAPINE 15 MG PO TABS: 15.0000 mg | ORAL_TABLET | Freq: Every day | ORAL | 1 refills | Status: DC

## 2020-08-22 MED ORDER — PANTOPRAZOLE SODIUM 40 MG PO TBEC: 40.0000 mg | DELAYED_RELEASE_TABLET | Freq: Every day | ORAL | 1 refills | Status: DC

## 2020-08-22 MED ORDER — SPIRIVA HANDIHALER 18 MCG IN CAPS: ORAL_CAPSULE | RESPIRATORY_TRACT | 1 refills | Status: DC

## 2020-08-22 MED ORDER — GABAPENTIN 300 MG PO CAPS: 300.0000 mg | ORAL_CAPSULE | Freq: Three times a day (TID) | ORAL | 1 refills | Status: DC

## 2020-08-22 MED ORDER — BACLOFEN 10 MG PO TABS: 5.0000 mg | ORAL_TABLET | Freq: Three times a day (TID) | ORAL | 3 refills | Status: DC | PRN

## 2020-08-22 NOTE — Progress Notes (Signed)
Subjective:    Patient ID: David [name], male    DOB: August 01, 1956, 65 y.o.   MRN: [id number]  David [name] is a 65 y.o. male presenting on 08/22/2020 for No chief complaint on file.   HPI  Lung Cancer, Small Cell - Stage IIa R Lung Severe protein calorie malnutrition Centrilobular Emphysema  Followed by Dr Grayland Ormond [facility], last visit 01/11/20 S/p completed chemotherapy 12/2015, CT imaging reviewed from 01/2020 per Oncology show no recurrence or progressive disease. He was advised to return 1 year later in 01/2021 No new concerns He needs refill on Spiriva and Albuterol PRN History of muscle spasms back pain and needs refill baclofen.  Insomnia Chronic problem. Not having symptoms that keep him awake with pain or breathing at night. But he has difficulty initiating sleep. Interested in medication Denies depression  GERD Chronic stomach acid symptoms, needs refill PPI   Health Maintenance: Declines vaccines, COVID Flu  Depression screen [facility] 2/9 08/22/2020 01/09/2018 09/07/2016  Decreased Interest 0 0 0  Down, Depressed, Hopeless 0 0 0  PHQ - 2 Score 0 0 0  Altered sleeping - - -  Tired, decreased energy - - -  Change in appetite - - -  Feeling bad or failure about yourself  - - -  Trouble concentrating - - -  Moving slowly or fidgety/restless - - -  Suicidal thoughts - - -  PHQ-9 Score - - -    Social History   Tobacco Use  . Smoking status: Current Every Day Smoker    Packs/day: 1.00    Years: 51.00    Pack years: 51.00    Types: Cigarettes    Start date: 08/12/1994  . Smokeless tobacco: Never Used  . Tobacco comment: used to smoke upto 2PPD, recently cut down  Substance Use Topics  . Alcohol use: No    Alcohol/week: 0.0 standard drinks  . Drug use: No    Comment: approx. a month quit    Review of Systems Per HPI unless specifically indicated above     Objective:    BP 104/66   Pulse 87   Ht 5\' 9"  (1.753 m)   Wt 127 lb (57.6 kg)   SpO2 100%   BMI  18.75 kg/m   Wt Readings from Last 3 Encounters:  08/22/20 127 lb (57.6 kg)  01/11/20 125 lb 3.2 oz (56.8 kg)  05/25/19 130 lb (59 kg)    Physical Exam Vitals and nursing note reviewed.  Constitutional:      General: He is not in acute distress.    Appearance: He is well-developed and well-nourished. He is not diaphoretic.     Comments: Thin chronically ill appearing but comfortable, cooperative  HENT:     Head: Normocephalic and atraumatic.     Mouth/Throat:     Mouth: Oropharynx is clear and moist.  Eyes:     General:        Right eye: No discharge.        Left eye: No discharge.     Conjunctiva/sclera: Conjunctivae normal.  Neck:     Thyroid: No thyromegaly.  Cardiovascular:     Rate and Rhythm: Normal rate and regular rhythm.     Pulses: Intact distal pulses.     Heart sounds: Normal heart sounds. No murmur heard.   Pulmonary:     Effort: Pulmonary effort is normal. No respiratory distress.     Breath sounds: No wheezing or rales.     Comments:  Reduced air movement diffusely, at baseline Musculoskeletal:        General: No edema. Normal range of motion.     Cervical back: Normal range of motion and neck supple.  Lymphadenopathy:     Cervical: No cervical adenopathy.  Skin:    General: Skin is warm and dry.     Findings: No erythema or rash.  Neurological:     Mental Status: He is alert and oriented to person, place, and time.  Psychiatric:        Mood and Affect: Mood and affect normal.        Behavior: Behavior normal.     Comments: Well groomed, good eye contact, normal speech and thoughts       Results for orders placed or performed during the hospital encounter of 01/10/20  I-STAT creatinine  Result Value Ref Range   Creatinine, Ser 1.00 0.61 - 1.24 mg/dL      Assessment & Plan:   Problem List Items Addressed This Visit    Small cell lung cancer ([place])    Followed by Dr Grayland Ormond [facility] Chemotherapy 12/2015 CT 01/2020 recurrence free Followed  yearly by Oncology      Relevant Medications   gabapentin (NEURONTIN) 300 MG capsule   Severe protein-calorie malnutrition (HCC)   Relevant Medications   mirtazapine (REMERON) 15 MG tablet   Primary osteoarthritis involving multiple joints (Chronic)    Re order baclofen PRN      Relevant Medications   gabapentin (NEURONTIN) 300 MG capsule   baclofen (LIORESAL) 10 MG tablet   Insomnia    Chronic issue Trial on Mirtazapine 15mg  nightly for sleep and improve appetite / nutrition      Relevant Medications   mirtazapine (REMERON) 15 MG tablet   Gastroesophageal reflux disease without esophagitis    Controlled on PPI Refill medication      Relevant Medications   pantoprazole (PROTONIX) 40 MG tablet   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)    Chronic pain Controlled on Gabapentin and Baclofen      Relevant Medications   gabapentin (NEURONTIN) 300 MG capsule   mirtazapine (REMERON) 15 MG tablet   baclofen (LIORESAL) 10 MG tablet   Centrilobular emphysema (HCC) - Primary    Stable centrilobular emphysema COPD Without exacerbation flare Improved on Spiriva maintenance - needs refill Needs albuterol refill for PRN use  Followed by Dr Grayland Ormond for lung cancer      Relevant Medications   tiotropium (SPIRIVA HANDIHALER) 18 MCG inhalation capsule   albuterol (PROAIR HFA) 108 (90 Base) MCG/ACT inhaler    Other Visit Diagnoses    Decreased appetite       Relevant Medications   pantoprazole (PROTONIX) 40 MG tablet      Meds ordered this encounter  Medications  . tiotropium (SPIRIVA HANDIHALER) 18 MCG inhalation capsule    Sig: INHALE 1 CAPSULE VIA HANDIHALER ONCE DAILY AT THE SAME TIME EVERY DAY    Dispense:  90 capsule    Refill:  1    DX Code Needed  . J43.2  . pantoprazole (PROTONIX) 40 MG tablet    Sig: Take 1 tablet (40 mg total) by mouth daily before breakfast.    Dispense:  90 tablet    Refill:  1    DX K21.9  . albuterol (PROAIR HFA) 108 (90 Base) MCG/ACT  inhaler    Sig: Inhale 2 puffs into the lungs every 6 (six) hours as needed for wheezing or shortness of breath.  Dispense:  8.5 each    Refill:  5    DX Code Needed  . J43.2  . gabapentin (NEURONTIN) 300 MG capsule    Sig: Take 1 capsule (300 mg total) by mouth 3 (three) times daily.    Dispense:  270 capsule    Refill:  1    DX Code Needed  . M15  . mirtazapine (REMERON) 15 MG tablet    Sig: Take 1 tablet (15 mg total) by mouth at bedtime.    Dispense:  90 tablet    Refill:  1  . baclofen (LIORESAL) 10 MG tablet    Sig: Take 0.5-1 tablets (5-10 mg total) by mouth 3 (three) times daily as needed for muscle spasms.    Dispense:  30 tablet    Refill:  3    DX Code Needed  . M15      Follow up plan: Return in about 6 months (around 02/19/2021) for 6 month follow-up med refills.  Nobie Putnam, [facility] 08/22/2020, 9:25 AM

## 2020-08-22 NOTE — Assessment & Plan Note (Signed)
Re order baclofen PRN

## 2020-08-22 NOTE — Assessment & Plan Note (Signed)
Followed by Dr Grayland Ormond [facility] Chemotherapy 12/2015 CT 01/2020 recurrence free Followed yearly by Oncology

## 2020-08-22 NOTE — Assessment & Plan Note (Signed)
Chronic issue Trial on Mirtazapine 15mg  nightly for sleep and improve appetite / nutrition

## 2020-08-22 NOTE — Patient Instructions (Addendum)
Thank you for coming to the office today.  All medicines refilled  Mirtazapine for sleep - take one nightly before bed, can increase dose in future if not strong enough.  Keep following up with Dr Grayland Ormond  Please schedule a Follow-up Appointment to: Return in about 6 months (around 02/19/2021) for 6 month follow-up med refills.  If you have any other questions or concerns, please feel free to call the office or send a message through [facility]. You may also schedule an earlier appointment if necessary.  Additionally, you may be receiving a survey about your experience at our office within a few days to 1 week by e-mail or mail. We value your feedback.  Nobie Putnam, DO [place]

## 2020-08-22 NOTE — Assessment & Plan Note (Signed)
Controlled on PPI Refill medication

## 2020-08-22 NOTE — Assessment & Plan Note (Signed)
Chronic pain Controlled on Gabapentin and Baclofen

## 2020-08-22 NOTE — Assessment & Plan Note (Signed)
Stable centrilobular emphysema COPD Without exacerbation flare Improved on Spiriva maintenance - needs refill Needs albuterol refill for PRN use  Followed by Dr Grayland Ormond for lung cancer

## 2020-08-25 ENCOUNTER — Telehealth: Payer: Self-pay | Admitting: Family Medicine

## 2020-08-25 NOTE — Telephone Encounter (Addendum)
Pt states he is not going to take the covid vaccine. He is not interested at all.

## 2020-11-25 ENCOUNTER — Encounter: Payer: Self-pay | Admitting: Unknown Physician Specialty

## 2020-11-25 ENCOUNTER — Other Ambulatory Visit: Payer: Self-pay

## 2020-11-25 ENCOUNTER — Ambulatory Visit (INDEPENDENT_AMBULATORY_CARE_PROVIDER_SITE_OTHER): Payer: Medicaid Other | Admitting: Unknown Physician Specialty

## 2020-11-25 VITALS — BP 95/55 | HR 91 | Temp 97.1°F | Resp 17 | Ht 69.0 in | Wt 125.6 lb

## 2020-11-25 DIAGNOSIS — H6123 Impacted cerumen, bilateral: Secondary | ICD-10-CM | POA: Diagnosis not present

## 2020-11-25 NOTE — Progress Notes (Signed)
BP (!) 95/55 (BP Location: Right Arm, Patient Position: Sitting, Cuff Size: Normal)   Pulse 91   Temp (!) 97.1 F (36.2 C) (Temporal)   Resp 17   Ht 5\' 9"  (1.753 m)   Wt 125 lb 9.6 oz (57 kg)   SpO2 97%   BMI 18.55 kg/m    Subjective:    Patient ID: David [name], male    DOB: February 03, 1956, 65 y.o.   MRN: [id number]  HPI: David [name] is a 65 y.o. male  Chief Complaint  Patient presents with  . Hearing Problem    Lt ear hearing loss x 1 mth. Pt attempted to clean the ear but no improvement. Denies any drainage or pain.   Pt states hearing loss for 1 month.  Able to hear best out of right ear.  Unable to clean wax out of his ears.  No pain or discharge  Relevant past medical, surgical, family and social history reviewed and updated as indicated. Interim medical history since our last visit reviewed. Allergies and medications reviewed and updated.  Review of Systems  Per HPI unless specifically indicated above     Objective:    BP (!) 95/55 (BP Location: Right Arm, Patient Position: Sitting, Cuff Size: Normal)   Pulse 91   Temp (!) 97.1 F (36.2 C) (Temporal)   Resp 17   Ht 5\' 9"  (1.753 m)   Wt 125 lb 9.6 oz (57 kg)   SpO2 97%   BMI 18.55 kg/m   Wt Readings from Last 3 Encounters:  11/25/20 125 lb 9.6 oz (57 kg)  08/22/20 127 lb (57.6 kg)  01/11/20 125 lb 3.2 oz (56.8 kg)    Physical Exam Constitutional:      General: He is not in acute distress.    Appearance: Normal appearance. He is well-developed.  HENT:     Head: Normocephalic and atraumatic.     Right Ear: There is impacted cerumen.     Left Ear: There is impacted cerumen.  Eyes:     General: Lids are normal. No scleral icterus.       Right eye: No discharge.        Left eye: No discharge.     Conjunctiva/sclera: Conjunctivae normal.  Cardiovascular:     Rate and Rhythm: Normal rate.  Pulmonary:     Effort: Pulmonary effort is normal.  Abdominal:     Palpations: There is no hepatomegaly or  splenomegaly.  Musculoskeletal:        General: Normal range of motion.  Skin:    Coloration: Skin is not pale.     Findings: No rash.  Neurological:     Mental Status: He is alert and oriented to person, place, and time.  Psychiatric:        Behavior: Behavior normal.        Thought Content: Thought content normal.        Judgment: Judgment normal.    Bilateral ears irrigated with water and peroxide.  Ears are without wax with mild irritation.    Results for orders placed or performed during the hospital encounter of 01/10/20  I-STAT creatinine  Result Value Ref Range   Creatinine, Ser 1.00 0.61 - 1.24 mg/dL      Assessment & Plan:   Problem List Items Addressed This Visit   None   Visit Diagnoses    Bilateral impacted cerumen    -  Primary   Bilateral irrigation with much improved hearing  Follow up plan: Return if symptoms worsen or fail to improve.

## 2021-01-08 ENCOUNTER — Ambulatory Visit: Admission: RE | Admit: 2021-01-08 | Payer: Medicaid Other | Source: Ambulatory Visit

## 2021-01-09 ENCOUNTER — Ambulatory Visit: Payer: Medicaid Other | Admitting: Oncology
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
## 2021-01-12 ENCOUNTER — Encounter: Payer: Self-pay | Admitting: Oncology

## 2021-01-12 ENCOUNTER — Inpatient Hospital Stay: Payer: Medicaid Other | Attending: Oncology | Admitting: Oncology

## 2021-01-12 VITALS — BP 119/77 | HR 88 | Temp 97.7°F | Resp 20 | Wt 126.8 lb

## 2021-01-12 DIAGNOSIS — C3491 Malignant neoplasm of unspecified part of right bronchus or lung: Secondary | ICD-10-CM | POA: Insufficient documentation

## 2021-01-12 DIAGNOSIS — Z85820 Personal history of malignant melanoma of skin: Secondary | ICD-10-CM | POA: Diagnosis not present

## 2021-01-12 DIAGNOSIS — Z9221 Personal history of antineoplastic chemotherapy: Secondary | ICD-10-CM | POA: Insufficient documentation

## 2021-01-12 DIAGNOSIS — C3490 Malignant neoplasm of unspecified part of unspecified bronchus or lung: Secondary | ICD-10-CM

## 2021-01-12 DIAGNOSIS — R911 Solitary pulmonary nodule: Secondary | ICD-10-CM | POA: Diagnosis not present

## 2021-01-12 DIAGNOSIS — F17210 Nicotine dependence, cigarettes, uncomplicated: Secondary | ICD-10-CM | POA: Diagnosis not present

## 2021-01-12 NOTE — Progress Notes (Signed)
Patient here today for follow up regarding lung cancer. Patient did not go to CT scan, states he forgot. Patient also reports that he walked to his appt today, offered to set patient up for transportation services home from appt today and for future follow up but he declines at this time.

## 2021-01-21 ENCOUNTER — Ambulatory Visit
Admission: RE | Admit: 2021-01-21 | Discharge: 2021-01-21 | Disposition: A | Discharge status: Home / Self Care | Payer: Medicaid Other | Source: Ambulatory Visit | Attending: Oncology | Admitting: Oncology

## 2021-01-21 ENCOUNTER — Other Ambulatory Visit: Payer: Self-pay

## 2021-01-21 DIAGNOSIS — C3490 Malignant neoplasm of unspecified part of unspecified bronchus or lung: Secondary | ICD-10-CM | POA: Diagnosis not present

## 2021-01-21 LAB — POCT I-STAT CREATININE: Creatinine, Ser: 1.2 mg/dL (ref 0.61–1.24)

## 2021-01-21 MED ORDER — IOHEXOL 300 MG/ML  SOLN
75.0000 mL | Freq: Once | INTRAMUSCULAR | Status: AC | PRN
  Administered 2021-01-21: 75 mL via INTRAVENOUS

## 2021-01-22 ENCOUNTER — Telehealth: Payer: Self-pay | Admitting: *Deleted

## 2021-01-22 NOTE — Telephone Encounter (Signed)
Called report  IMPRESSION: 1. Unchanged post treatment appearance of a mass of the right upper lobe, with central calcification, measuring 3.3 x 2.2 cm. 2. Interval enlargement of a spiculated appearing nodule of the superior segment right lower lobe abutting the major fissure, measuring 1.3 x 1.0 cm, previously 1.0 x 0.7 cm. This is highly concerning for metachronous primary lung malignancy. 3. Additional small bilateral pulmonary nodules are unchanged and almost certainly benign. 4. Emphysema. 5. Coronary artery disease.   These results will be called to the ordering clinician or representative by the Radiologist Assistant, and communication documented in the PACS or [employer].   Aortic Atherosclerosis (ICD10-I70.0) and Emphysema (ICD10-J43.9).     Electronically Signed   By: Eddie Candle M.D.   On: 01/22/2021 10:10

## 2021-01-27 ENCOUNTER — Telehealth: Payer: Self-pay | Admitting: *Deleted

## 2021-01-27 NOTE — Telephone Encounter (Signed)
T called stating he thought someone was going to call him about his xray results and he is asking for a return call to get results. His next apt is not til 2023

## 2021-01-28 ENCOUNTER — Other Ambulatory Visit: Payer: Self-pay | Admitting: *Deleted

## 2021-01-28 DIAGNOSIS — C3490 Malignant neoplasm of unspecified part of unspecified bronchus or lung: Secondary | ICD-10-CM

## 2021-01-28 NOTE — Telephone Encounter (Signed)
Referral to rad onc entered, scheduling message sent.

## 2021-01-28 NOTE — Telephone Encounter (Signed)
Patient informed of appointment 02/04/21 @930  with Dr Grayland Ormond to go over his CT results. He stated, "OK thank you"

## 2021-01-31 NOTE — Progress Notes (Signed)
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  INTERVAL HISTORY: Patient returns to clinic today for further evaluation and discussion of his imaging results.  He currently feels well and is asymptomatic. He has no neurologic complaints.  He denies any recent fevers or illnesses.  He has a good appetite and denies weight loss.  He denies any chest pain, shortness of breath, cough, or hemoptysis.  He denies any nausea, vomiting, constipation, or diarrhea. He has no urinary complaints.  Patient feels at his baseline offers no specific complaints today.  REVIEW OF SYSTEMS:   Review of Systems  Constitutional: Negative.  Negative for fever, malaise/fatigue and weight loss.  HENT:  Negative for tinnitus.   Respiratory: Negative.  Negative for cough, hemoptysis and shortness of breath.   Cardiovascular: Negative.  Negative for chest pain and leg swelling.  Gastrointestinal: Negative.  Negative for abdominal pain.  Genitourinary: Negative.  Negative for dysuria.  Musculoskeletal: Negative.  Negative for joint pain and myalgias.  Skin: Negative.  Negative for rash.  Neurological: Negative.  Negative for sensory change, focal weakness, weakness and headaches.  Psychiatric/Behavioral: Negative.  The patient is not nervous/anxious.    As per HPI. Otherwise, a complete review of systems is negative.  PAST MEDICAL HISTORY: Past Medical History:  Diagnosis Date   Arthritis    Asthma    COPD (chronic obstructive pulmonary disease) ([place])    not on home o2   Gout    History of kidney stones    History of melanoma    Hypertension    Oxygen deficiency    O2 1 LITER [facility]   Pain    chronic pain all over   Small cell lung cancer ([place])    melanoma    Tobacco use     PAST SURGICAL HISTORY: Past Surgical History:  Procedure Laterality Date   ELECTROMAGNETIC NAVIGATION BROCHOSCOPY N/A 07/22/2016   Procedure: ELECTROMAGNETIC NAVIGATION BRONCHOSCOPY;  Surgeon: Flora Lipps, MD;  Location: [facility];  Service: Cardiopulmonary;  Laterality: N/A;   PERIPHERAL VASCULAR CATHETERIZATION N/A 09/15/2015   Procedure: Glori Luis Cath Insertion;  Surgeon: Algernon Huxley, MD;  Location: [place] [facility];  Service: Cardiovascular;  Laterality: N/A;   PORTA CATH REMOVAL N/A 05/05/2017   Procedure: Glori Luis Cath Removal;  Surgeon: Algernon Huxley, MD;  Location: [place] [facility];  Service: Cardiovascular;  Laterality: N/A;   SKIN GRAFT     [facility]    FAMILY HISTORY: Reviewed and unchanged. No reported history of malignancy or chronic disease.     ADVANCED DIRECTIVES:    HEALTH MAINTENANCE: Social History   Tobacco Use   Smoking status: Every Day    Packs/day: 1.00    Years: 51.00    Pack years: 51.00    Types: Cigarettes    Start date: 08/12/1994   Smokeless tobacco: Never   Tobacco comments:    used to smoke upto 2PPD, recently cut down  Vaping Use   Vaping Use: Never used  Substance Use Topics   Alcohol use: No    Alcohol/week: 0.0 standard drinks   Drug use: No    Comment: approx. a month quit     Colonoscopy:  PAP:  Bone density:  Lipid panel:  Allergies  Allergen Reactions  Tylenol [Acetaminophen] Other (See Comments)    Unable to take due to chemotherapy regimen   Vicodin [Hydrocodone-Acetaminophen] Nausea And Vomiting and Rash    Current Outpatient Medications  Medication Sig Dispense Refill   albuterol (PROAIR HFA) 108 (90 Base) MCG/ACT inhaler Inhale 2 puffs into the lungs every 6 (six) hours as needed for wheezing or shortness of breath. (Patient not taking: Reported on 02/04/2021) 8.5 each 5   baclofen (LIORESAL) 10 MG tablet Take 0.5-1 tablets (5-10 mg total) by mouth 3 (three) times daily as needed for muscle spasms.  (Patient not taking: Reported on 02/04/2021) 30 tablet 3   gabapentin (NEURONTIN) 300 MG capsule Take 1 capsule (300 mg total) by mouth 3 (three) times daily. (Patient not taking: Reported on 02/04/2021) 270 capsule 1   mirtazapine (REMERON) 15 MG tablet Take 1 tablet (15 mg total) by mouth at bedtime. (Patient not taking: Reported on 02/04/2021) 90 tablet 1   pantoprazole (PROTONIX) 40 MG tablet Take 1 tablet (40 mg total) by mouth daily before breakfast. (Patient not taking: Reported on 02/04/2021) 90 tablet 1   tiotropium (SPIRIVA HANDIHALER) 18 MCG inhalation capsule INHALE 1 CAPSULE VIA HANDIHALER ONCE DAILY AT THE SAME TIME EVERY DAY (Patient not taking: Reported on 02/04/2021) 90 capsule 1   No current facility-administered medications for this visit.    OBJECTIVE: Vitals:   02/04/21 0917  BP: 122/75  Pulse: 76  Resp: 20  Temp: (!) 96 F (35.6 C)     Body mass index is 18.5 kg/m.    ECOG FS:0 - Asymptomatic  General: Well-developed, well-nourished, no acute distress. Eyes: Pink conjunctiva, anicteric sclera. HEENT: Normocephalic, moist mucous membranes. Lungs: No audible wheezing or coughing. Heart: Regular rate and rhythm. Abdomen: Soft, nontender, no obvious distention. Musculoskeletal: No edema, cyanosis, or clubbing. Neuro: Alert, answering all questions appropriately. Cranial nerves grossly intact. Skin: No rashes or petechiae noted. Psych: Normal affect.   LAB RESULTS:  Lab Results  Component Value Date   NA 139 02/16/2019   K 4.0 02/16/2019   CL 105 02/16/2019   CO2 25 02/16/2019   GLUCOSE 123 (H) 02/16/2019   BUN 13 02/16/2019   CREATININE 1.20 01/21/2021   CALCIUM 8.8 (L) 02/16/2019   PROT 6.9 02/16/2019   ALBUMIN 3.8 02/16/2019   AST 15 02/16/2019   ALT 9 02/16/2019   ALKPHOS 59 02/16/2019   BILITOT 0.4 02/16/2019   GFRNONAA >60 02/16/2019   GFRAA >60 02/16/2019    Lab Results  Component Value Date   WBC 6.8 02/16/2019   NEUTROABS 4.3 02/16/2019    HGB 12.6 (L) 02/16/2019   HCT 38.2 (L) 02/16/2019   MCV 91.2 02/16/2019   PLT 229 02/16/2019     STUDIES: CT Chest W Contrast  Result Date: 01/22/2021 CLINICAL DATA:  Right lung cancer restaging EXAM: CT CHEST WITH CONTRAST TECHNIQUE: Multidetector CT imaging of the chest was performed during intravenous contrast administration. CONTRAST:  105mL OMNIPAQUE IOHEXOL 300 MG/ML  SOLN COMPARISON:  CT chest, 01/10/2020, 01/13/2019 FINDINGS: Cardiovascular: Aortic atherosclerosis. Normal heart size. Scattered left coronary artery calcifications. No pericardial effusion. Mediastinum/Nodes: No enlarged mediastinal, hilar, or axillary lymph nodes. Thyroid gland, trachea, and esophagus demonstrate no significant findings. Lungs/Pleura: Moderate to severe centrilobular emphysema. Diffuse bilateral bronchial wall thickening. Unchanged post treatment appearance of a mass of the right upper lobe, with central calcification, measuring 3.3 x 2.2 cm (series 3, image 64). Interval enlargement of a spiculated appearing nodule of the superior segment right lower lobe abutting the  major fissure, measuring 1.3 x 1.0 cm, previously 1.0 x 0.7 cm (series 3, image 81, series 4, image 78). Additional small bilateral pulmonary nodules are unchanged, many along the fissures. No pleural effusion or pneumothorax. Upper Abdomen: No acute abnormality. Musculoskeletal: No chest wall mass or suspicious bone lesions identified. IMPRESSION: 1. Unchanged post treatment appearance of a mass of the right upper lobe, with central calcification, measuring 3.3 x 2.2 cm. 2. Interval enlargement of a spiculated appearing nodule of the superior segment right lower lobe abutting the major fissure, measuring 1.3 x 1.0 cm, previously 1.0 x 0.7 cm. This is highly concerning for metachronous primary lung malignancy. 3. Additional small bilateral pulmonary nodules are unchanged and almost certainly benign. 4. Emphysema. 5. Coronary artery disease. These  results will be called to the ordering clinician or representative by the Radiologist Assistant, and communication documented in the PACS or [employer]. Aortic Atherosclerosis (ICD10-I70.0) and Emphysema (ICD10-J43.9). Electronically Signed   By: Eddie Candle M.D.   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  PLAN:    1. 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  CT scan results from January 22, 2021 reviewed independently and reported as above with unchanged appearance of right upper lobe mass, but patient does have interval enlargement of right lower lobe lesion now measuring 1.3 x 1.0 cm.  Suspect this is a synchronous primary and not recurrence.  Patient has appointment with radiation oncology later today.  I have also ordered PET scan for further evaluation.  Follow-up will be based on radiation oncology recommendations.  2.  Pain: Resolved.  Patient does not complain of pain today.  Given patient's history of substance abuse, he will not be given narcotics from this clinic.   3. History of melanoma: Patient is a poor historian but appears to have been greater than 5 years ago. Monitor.  I spent a total of 20 minutes reviewing chart data, face-to-face evaluation with the patient, counseling and coordination of care as detailed above.    Patient expressed understanding and was in agreement with this plan. He also understands that He can call clinic at any time with any questions, concerns, or complaints.   Lloyd Huger, MD   02/06/2021 7:47 AM

## 2021-02-04 ENCOUNTER — Ambulatory Visit
Admission: RE | Admit: 2021-02-04 | Discharge: 2021-02-04 | Disposition: A | Discharge status: Home / Self Care | Payer: Medicaid Other | Source: Ambulatory Visit | Attending: Radiation Oncology | Admitting: Radiation Oncology

## 2021-02-04 ENCOUNTER — Inpatient Hospital Stay (HOSPITAL_BASED_OUTPATIENT_CLINIC_OR_DEPARTMENT_OTHER): Payer: Medicaid Other | Admitting: Oncology

## 2021-02-04 ENCOUNTER — Encounter: Payer: Self-pay | Admitting: Oncology

## 2021-02-04 VITALS — BP 122/75 | HR 76 | Temp 96.0°F | Resp 20 | Wt 125.3 lb

## 2021-02-04 DIAGNOSIS — Z79899 Other long term (current) drug therapy: Secondary | ICD-10-CM | POA: Insufficient documentation

## 2021-02-04 DIAGNOSIS — C3491 Malignant neoplasm of unspecified part of right bronchus or lung: Secondary | ICD-10-CM | POA: Diagnosis not present

## 2021-02-04 DIAGNOSIS — C3490 Malignant neoplasm of unspecified part of unspecified bronchus or lung: Secondary | ICD-10-CM | POA: Diagnosis not present

## 2021-02-04 DIAGNOSIS — Z85820 Personal history of malignant melanoma of skin: Secondary | ICD-10-CM | POA: Insufficient documentation

## 2021-02-04 DIAGNOSIS — Z801 Family history of malignant neoplasm of trachea, bronchus and lung: Secondary | ICD-10-CM | POA: Insufficient documentation

## 2021-02-04 DIAGNOSIS — I10 Essential (primary) hypertension: Secondary | ICD-10-CM | POA: Insufficient documentation

## 2021-02-04 DIAGNOSIS — M129 Arthropathy, unspecified: Secondary | ICD-10-CM | POA: Insufficient documentation

## 2021-02-04 DIAGNOSIS — Z87442 Personal history of urinary calculi: Secondary | ICD-10-CM | POA: Insufficient documentation

## 2021-02-04 DIAGNOSIS — F17210 Nicotine dependence, cigarettes, uncomplicated: Secondary | ICD-10-CM | POA: Insufficient documentation

## 2021-02-04 DIAGNOSIS — J45909 Unspecified asthma, uncomplicated: Secondary | ICD-10-CM | POA: Insufficient documentation

## 2021-02-04 DIAGNOSIS — C3431 Malignant neoplasm of lower lobe, right bronchus or lung: Secondary | ICD-10-CM | POA: Insufficient documentation

## 2021-02-04 DIAGNOSIS — Z923 Personal history of irradiation: Secondary | ICD-10-CM | POA: Insufficient documentation

## 2021-02-04 DIAGNOSIS — J449 Chronic obstructive pulmonary disease, unspecified: Secondary | ICD-10-CM | POA: Insufficient documentation

## 2021-02-04 NOTE — Consult Note (Signed)
NEW PATIENT EVALUATION  Name: David [name]  MRN: [id number]  Date:   02/04/2021     DOB: February 10, 1956   This 65 y.o. 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   DIAGNOSIS: The encounter diagnosis was Small cell lung cancer ([place]).   PREVIOUS INVESTIGATIONS:  CT scans reviewed PET CT scan ordered Pathology reviewed Clinical notes reviewed  HPI: Patient is a 65 year old male well-known to our department having completed concurrent chemoradiation therapy for small cell lung cancer limited stage as well as PCI.  5 years prior.  He recently had an episode of hemoptysis work-up including repeat CT scan of the chest showed unchanged posttreatment appearance of the right upper lobe.  There was however an interval enlargement of the spiculated appearing nodule at the superior segment of the right lower lobe abutting the major fissure measuring 1.3 x 1 cm which had slightly grown over time this was concerning for metachronous primary lung went cancer.  He has a PET/CT scheduled.  He is seen today for evaluation for possible SBRT.  Again he is having very little symptoms at this time no cough hemoptysis or chest tightness.  PLANNED TREATMENT REGIMEN: SBRT  PAST MEDICAL HISTORY:  has a past medical history of Arthritis, Asthma, COPD (chronic obstructive pulmonary disease) ([place]), Gout, History of kidney stones, History of melanoma, Hypertension, Oxygen deficiency, Pain, Small cell lung cancer ([place]), and Tobacco use.    PAST SURGICAL HISTORY:  Past Surgical History:  Procedure Laterality Date   ELECTROMAGNETIC NAVIGATION BROCHOSCOPY N/A 07/22/2016   Procedure: ELECTROMAGNETIC NAVIGATION BRONCHOSCOPY;  Surgeon: Flora Lipps, MD;  Location: [facility];  Service:  Cardiopulmonary;  Laterality: N/A;   PERIPHERAL VASCULAR CATHETERIZATION N/A 09/15/2015   Procedure: Glori Luis Cath Insertion;  Surgeon: Algernon Huxley, MD;  Location: [facility];  Service: Cardiovascular;  Laterality: N/A;   PORTA CATH REMOVAL N/A 05/05/2017   Procedure: Glori Luis Cath Removal;  Surgeon: Algernon Huxley, MD;  Location: [facility];  Service: Cardiovascular;  Laterality: N/A;   SKIN GRAFT     [facility]    FAMILY HISTORY: family history includes Cirrhosis (age of onset: 40) in his father; Heart attack (age of onset: 64) in his mother; Lung cancer in his mother.  SOCIAL HISTORY:  reports that he has been smoking cigarettes. He started smoking about 26 years ago. He has a 51.00 pack-year smoking history. He has never used smokeless tobacco. He reports that he does not drink alcohol and does not use drugs.  ALLERGIES: Tylenol [acetaminophen] and Vicodin [hydrocodone-acetaminophen]  MEDICATIONS:  Current Outpatient Medications  Medication Sig Dispense Refill   albuterol (PROAIR HFA) 108 (90 Base) MCG/ACT inhaler Inhale 2 puffs into the lungs every 6 (six) hours as needed for wheezing or shortness of breath. (Patient not taking: Reported on 02/04/2021) 8.5 each 5   baclofen (LIORESAL) 10 MG tablet Take 0.5-1 tablets (5-10 mg total) by mouth 3 (three) times daily as needed for muscle spasms. (Patient not taking: Reported on 02/04/2021) 30 tablet 3   gabapentin (NEURONTIN) 300 MG capsule Take 1 capsule (300 mg total) by mouth 3 (three) times daily. (Patient not taking: Reported on 02/04/2021) 270 capsule 1   mirtazapine (REMERON) 15 MG tablet Take 1 tablet (15 mg total) by mouth at  bedtime. (Patient not taking: Reported on 02/04/2021) 90 tablet 1   pantoprazole (PROTONIX) 40 MG tablet Take 1 tablet (40 mg total) by mouth daily before breakfast. (Patient not taking: Reported on 02/04/2021) 90 tablet 1   tiotropium (SPIRIVA HANDIHALER) 18 MCG inhalation capsule INHALE 1 CAPSULE VIA HANDIHALER  ONCE DAILY AT THE SAME TIME EVERY DAY (Patient not taking: Reported on 02/04/2021) 90 capsule 1   No current facility-administered medications for this encounter.    ECOG PERFORMANCE STATUS:  0 - Asymptomatic  REVIEW OF SYSTEMS: Patient denies any weight loss, fatigue, weakness, fever, chills or night sweats. Patient denies any loss of vision, blurred vision. Patient denies any ringing  of the ears or hearing loss. No irregular heartbeat. Patient denies heart murmur or history of fainting. Patient denies any chest pain or pain radiating to her upper extremities. Patient denies any shortness of breath, difficulty breathing at night, cough or hemoptysis. Patient denies any swelling in the lower legs. Patient denies any nausea vomiting, vomiting of blood, or coffee ground material in the vomitus. Patient denies any stomach pain. Patient states has had normal bowel movements no significant constipation or diarrhea. Patient denies any dysuria, hematuria or significant nocturia. Patient denies any problems walking, swelling in the joints or loss of balance. Patient denies any skin changes, loss of hair or loss of weight. Patient denies any excessive worrying or anxiety or significant depression. Patient denies any problems with insomnia. Patient denies excessive thirst, polyuria, polydipsia. Patient denies any swollen glands, patient denies easy bruising or easy bleeding. Patient denies any recent infections, allergies or URI. Patient "s visual fields have not changed significantly in recent time.   PHYSICAL EXAM: There were no vitals taken for this visit. Well-developed well-nourished patient in NAD. HEENT reveals PERLA, EOMI, discs not visualized.  Oral cavity is clear. No oral mucosal lesions are identified. Neck is clear without evidence of cervical or supraclavicular adenopathy. Lungs are clear to A&P. Cardiac examination is essentially unremarkable with regular rate and rhythm without murmur rub or  thrill. Abdomen is benign with no organomegaly or masses noted. Motor sensory and DTR levels are equal and symmetric in the upper and lower extremities. Cranial nerves II through XII are grossly intact. Proprioception is intact. No peripheral adenopathy or edema is identified. No motor or sensory levels are noted. Crude visual fields are within normal range.  LABORATORY DATA: All labs reviewed    RADIOLOGY RESULTS: CT scan reviewed PET CT scan reviewed prior to initiating treatment   IMPRESSION: Probable stage I non-small cell lung cancer of the superior segment of the right lower lobe in 65 year old male with previous history of limited stage small cell lung cancer in complete remission from concurrent chemoradiation  PLAN: At this time like to review the PET CT scan if this is PET positive would go ahead with SBRT to his right lung lesion.  We will plan on delivering 60 Gray in 5 fractions.  Would use motion restriction as well as 4-dimensional treatment planning.  Risks and benefits of treatment clinic possible development of cough fatigue skin reaction all were reviewed in detail with the patient.  He seems to comprehend my treatment plan well.  I will review his PET/CT findings prior to initiating CT simulation although I already have gone ahead and book that simulation appointment.  Patient comprehends my recommendations well.  I would like to take this opportunity to thank you for allowing me to participate in the care of your patient.Eulas Post  Danuta Huseman, MD

## 2021-02-04 NOTE — Progress Notes (Signed)
Patient here today for follow up regarding CT results.

## 2021-02-05 ENCOUNTER — Ambulatory Visit: Payer: Medicaid Other | Admitting: Oncology

## 2021-02-18 ENCOUNTER — Other Ambulatory Visit: Payer: Self-pay

## 2021-02-18 ENCOUNTER — Ambulatory Visit
Admission: RE | Admit: 2021-02-18 | Discharge: 2021-02-18 | Disposition: A | Discharge status: Home / Self Care | Payer: Medicaid Other | Source: Ambulatory Visit | Attending: Oncology | Admitting: Oncology

## 2021-02-18 DIAGNOSIS — K8020 Calculus of gallbladder without cholecystitis without obstruction: Secondary | ICD-10-CM | POA: Insufficient documentation

## 2021-02-18 DIAGNOSIS — I700 Atherosclerosis of aorta: Secondary | ICD-10-CM | POA: Diagnosis not present

## 2021-02-18 DIAGNOSIS — C3490 Malignant neoplasm of unspecified part of unspecified bronchus or lung: Secondary | ICD-10-CM | POA: Diagnosis not present

## 2021-02-18 LAB — GLUCOSE, CAPILLARY: Glucose-Capillary: 96 mg/dL (ref 70–99)

## 2021-02-18 MED ORDER — FLUDEOXYGLUCOSE F - 18 (FDG) INJECTION
6.4900 | Freq: Once | INTRAVENOUS | Status: AC | PRN
  Administered 2021-02-18: 6.49 via INTRAVENOUS

## 2021-02-23 ENCOUNTER — Telehealth: Payer: Self-pay | Admitting: *Deleted

## 2021-02-23 ENCOUNTER — Other Ambulatory Visit: Payer: Self-pay | Admitting: Radiation Oncology

## 2021-02-23 ENCOUNTER — Other Ambulatory Visit: Payer: Self-pay | Admitting: *Deleted

## 2021-02-23 DIAGNOSIS — C3490 Malignant neoplasm of unspecified part of unspecified bronchus or lung: Secondary | ICD-10-CM

## 2021-02-23 NOTE — Telephone Encounter (Signed)
Spoke with patient regarding Dr. Baruch Gouty wants to hold off on the SBRT treatments and have a follow up with him in 4 months.  Patient to have a CT w contrast on 11-14 and a follow up visit on 11-21.  David [name] verbalized understanding of all information and appointment times.

## 2021-02-24 ENCOUNTER — Ambulatory Visit: Payer: Medicaid Other

## 2021-02-24 ENCOUNTER — Other Ambulatory Visit: Payer: Self-pay | Admitting: Oncology

## 2021-02-24 DIAGNOSIS — C3490 Malignant neoplasm of unspecified part of unspecified bronchus or lung: Secondary | ICD-10-CM

## 2021-03-03 ENCOUNTER — Telehealth: Payer: Self-pay

## 2021-03-03 NOTE — Telephone Encounter (Signed)
Pt is requesting a refill on his stomach pill, [facility] [place].  PT call back # is [phone]

## 2021-03-03 NOTE — Telephone Encounter (Signed)
Called the pt for clarification of which meds to refill because he did not specify. Left message to return call.

## 2021-03-10 ENCOUNTER — Ambulatory Visit: Payer: Medicaid Other

## 2021-03-12 ENCOUNTER — Ambulatory Visit: Payer: Medicaid Other

## 2021-03-17 ENCOUNTER — Ambulatory Visit: Payer: Medicaid Other

## 2021-03-19 ENCOUNTER — Ambulatory Visit: Payer: Medicaid Other

## 2021-03-24 ENCOUNTER — Ambulatory Visit: Payer: Medicaid Other

## 2021-05-25 ENCOUNTER — Other Ambulatory Visit: Payer: Self-pay | Admitting: Family Medicine

## 2021-05-25 DIAGNOSIS — K219 Gastro-esophageal reflux disease without esophagitis: Secondary | ICD-10-CM

## 2021-05-25 DIAGNOSIS — R630 Anorexia: Secondary | ICD-10-CM

## 2021-05-25 DIAGNOSIS — J432 Centrilobular emphysema: Secondary | ICD-10-CM

## 2021-05-25 NOTE — Telephone Encounter (Signed)
Requested medications are due for refill today.  unknown  Requested medications are on the active medications list.  yes  Last refill. 08/22/2020  Future visit scheduled.   no  Notes to clinic.  Per note of 02/04/2021 pt is no longer taking this any longer.

## 2021-06-04 MED ORDER — ALBUTEROL SULFATE HFA 108 (90 BASE) MCG/ACT IN AERS: 2.0000 | INHALATION_SPRAY | Freq: Four times a day (QID) | RESPIRATORY_TRACT | 5 refills | Status: DC | PRN

## 2021-06-04 MED ORDER — SPIRIVA HANDIHALER 18 MCG IN CAPS: ORAL_CAPSULE | RESPIRATORY_TRACT | 1 refills | Status: DC

## 2021-06-04 NOTE — Telephone Encounter (Signed)
Requested medication (s) are due for refill today: yes  Requested medication (s) are on the active medication list: yes  Last refill:  proair - 8.5 g 5 refills 08/22/20, spiriva - 08/22/20 #90 1 refill  Future visit scheduled: no  Notes to clinic:  Dx code needed     Requested Prescriptions  Pending Prescriptions Disp Refills   albuterol (PROAIR HFA) 108 (90 Base) MCG/ACT inhaler 8.5 each 5    Sig: Inhale 2 puffs into the lungs every 6 (six) hours as needed for wheezing or shortness of breath.     Pulmonology:  Beta Agonists Failed - 06/04/2021  9:42 AM      Failed - One inhaler should last at least one month. If the patient is requesting refills earlier, contact the patient to check for uncontrolled symptoms.      Passed - Valid encounter within last 12 months    Recent Outpatient Visits           6 months ago Bilateral impacted cerumen   [place], NP   9 months ago Centrilobular emphysema [facility])   [place], DO   3 years ago Breast pain, left   [facility] [place], Devonne Doughty, DO   3 years ago Chronic obstructive pulmonary disease, unspecified COPD type [facility])   [place], DO   3 years ago Right wrist pain   [place], DO               tiotropium (SPIRIVA HANDIHALER) 18 MCG inhalation capsule 90 capsule 1    Sig: INHALE 1 CAPSULE VIA HANDIHALER ONCE DAILY AT THE SAME TIME EVERY DAY     Pulmonology:  Anticholinergic Agents Passed - 06/04/2021  9:42 AM      Passed - Valid encounter within last 12 months    Recent Outpatient Visits           6 months ago Bilateral impacted cerumen   [place], NP   9 months ago Centrilobular emphysema [facility])   [place], DO   3 years ago Breast pain, left   [facility] [place], Devonne Doughty, DO   3 years ago Chronic obstructive pulmonary disease, unspecified COPD type [facility])   [facility] Olin Hauser, DO   3 years ago Right wrist pain   [place], DO              Refused Prescriptions Disp Refills   pantoprazole (PROTONIX) 40 MG tablet [Pharmacy Med Name: PANTOPRAZOLE SOD DR 40 MG TAB] 90 tablet 1    Sig: TAKE 1 TABLET BY [facility]     Gastroenterology: Proton Pump Inhibitors Passed - 06/04/2021  9:42 AM      Passed - Valid encounter within last 12 months    Recent Outpatient Visits           6 months ago Bilateral impacted cerumen   [place], NP   9 months ago Centrilobular emphysema [facility])   [place], DO   3 years ago Breast pain, left   [facility] [place], Devonne Doughty, DO   3 years ago Chronic obstructive pulmonary disease, unspecified COPD type [facility])   [facility],  Devonne Doughty, DO   3 years ago Right wrist pain   [place], [place]

## 2021-06-04 NOTE — Addendum Note (Signed)
Addended by: Matilde Sprang on: 06/04/2021 09:42 AM   Modules accepted: Orders

## 2021-06-04 NOTE — Telephone Encounter (Signed)
Pt called about the status of refill request for pantoprazole /pt stated he still takes this medication / please advise   Medication Refill - Medication: tiotropium (SPIRIVA HANDIHALER) 18 MCG inhalation capsule  albuterol (PROAIR HFA) 108 (90 Base) MCG/ACT inhaler  Has the patient contacted their pharmacy? Yes.   (Agent: If no, request that the patient contact the pharmacy for the refill. If patient does not wish to contact the pharmacy document the reason why and proceed with request.) (Agent: If yes, when and what did the pharmacy advise?) He was advised to contact pcp for refills  Preferred Pharmacy (with phone number or street name): [id number] [place], [address]  [address], [place] [address]  Phone:  [phone]  Fax:  [phone]  Has the patient been seen for an appointment in the last year OR does the patient have an upcoming appointment? Yes.    Agent: Please be advised that RX refills may take up to 3 business days. We ask that you follow-up with your pharmacy.

## 2021-06-10 ENCOUNTER — Other Ambulatory Visit: Payer: Self-pay | Admitting: Family Medicine

## 2021-06-10 DIAGNOSIS — R630 Anorexia: Secondary | ICD-10-CM

## 2021-06-10 DIAGNOSIS — K219 Gastro-esophageal reflux disease without esophagitis: Secondary | ICD-10-CM

## 2021-06-10 MED ORDER — PANTOPRAZOLE SODIUM 40 MG PO TBEC: 40.0000 mg | DELAYED_RELEASE_TABLET | Freq: Every day | ORAL | 1 refills | Status: DC

## 2021-06-16 ENCOUNTER — Other Ambulatory Visit: Payer: Self-pay | Admitting: Radiation Oncology

## 2021-06-16 DIAGNOSIS — C3490 Malignant neoplasm of unspecified part of unspecified bronchus or lung: Secondary | ICD-10-CM

## 2021-06-18 NOTE — Progress Notes (Deleted)
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  INTERVAL HISTORY: Patient returns to clinic today for further evaluation and discussion of his imaging results.  He currently feels well and is asymptomatic. He has no neurologic complaints.  He denies any recent fevers or illnesses.  He has a good appetite and denies weight loss.  He denies any chest pain, shortness of breath, cough, or hemoptysis.  He denies any nausea, vomiting, constipation, or diarrhea. He has no urinary complaints.  Patient feels at his baseline offers no specific complaints today.  REVIEW OF SYSTEMS:   Review of Systems  Constitutional: Negative.  Negative for fever, malaise/fatigue and weight loss.  HENT:  Negative for tinnitus.   Respiratory: Negative.  Negative for cough, hemoptysis and shortness of breath.   Cardiovascular: Negative.  Negative for chest pain and leg swelling.  Gastrointestinal: Negative.  Negative for abdominal pain.  Genitourinary: Negative.  Negative for dysuria.  Musculoskeletal: Negative.  Negative for joint pain and myalgias.  Skin: Negative.  Negative for rash.  Neurological: Negative.  Negative for sensory change, focal weakness, weakness and headaches.  Psychiatric/Behavioral: Negative.  The patient is not nervous/anxious.    As per HPI. Otherwise, a complete review of systems is negative.  PAST MEDICAL HISTORY: Past Medical History:  Diagnosis Date   Arthritis    Asthma    COPD (chronic obstructive pulmonary disease) ([place])    not on home o2   Gout    History of kidney stones    History of melanoma    Hypertension    Oxygen deficiency    O2 1 LITER [facility]   Pain    chronic pain all over   Small cell lung cancer ([place])    melanoma    Tobacco use     PAST SURGICAL HISTORY: Past Surgical History:  Procedure Laterality Date   ELECTROMAGNETIC NAVIGATION BROCHOSCOPY N/A 07/22/2016   Procedure: ELECTROMAGNETIC NAVIGATION BRONCHOSCOPY;  Surgeon: Flora Lipps, MD;  Location: [facility];  Service: Cardiopulmonary;  Laterality: N/A;   PERIPHERAL VASCULAR CATHETERIZATION N/A 09/15/2015   Procedure: Glori Luis Cath Insertion;  Surgeon: Algernon Huxley, MD;  Location: [place] [facility];  Service: Cardiovascular;  Laterality: N/A;   PORTA CATH REMOVAL N/A 05/05/2017   Procedure: Glori Luis Cath Removal;  Surgeon: Algernon Huxley, MD;  Location: [facility] [place] [facility];  Service: Cardiovascular;  Laterality: N/A;   SKIN GRAFT     [facility]    FAMILY HISTORY: Reviewed and unchanged. No reported history of malignancy or chronic disease.     ADVANCED DIRECTIVES:    HEALTH MAINTENANCE: Social History   Tobacco Use   Smoking status: Every Day    Packs/day: 1.00    Years: 51.00    Pack years: 51.00    Types: Cigarettes    Start date: 08/12/1994   Smokeless tobacco: Never   Tobacco comments:    used to smoke upto 2PPD, recently cut down  Vaping Use   Vaping Use: Never used  Substance Use Topics   Alcohol use: No    Alcohol/week: 0.0 standard drinks   Drug use: No    Comment: approx. a month quit     Colonoscopy:  PAP:  Bone density:  Lipid panel:  Allergies  Allergen Reactions  Tylenol [Acetaminophen] Other (See Comments)    Unable to take due to chemotherapy regimen   Vicodin [Hydrocodone-Acetaminophen] Nausea And Vomiting and Rash    Current Outpatient Medications  Medication Sig Dispense Refill   albuterol (PROAIR HFA) 108 (90 Base) MCG/ACT inhaler Inhale 2 puffs into the lungs every 6 (six) hours as needed for wheezing or shortness of breath. 8.5 each 5   baclofen (LIORESAL) 10 MG tablet Take 0.5-1 tablets (5-10 mg total) by mouth 3 (three) times daily as needed for muscle spasms. (Patient not taking: Reported on 02/04/2021) 30  tablet 3   gabapentin (NEURONTIN) 300 MG capsule Take 1 capsule (300 mg total) by mouth 3 (three) times daily. (Patient not taking: Reported on 02/04/2021) 270 capsule 1   mirtazapine (REMERON) 15 MG tablet Take 1 tablet (15 mg total) by mouth at bedtime. (Patient not taking: Reported on 02/04/2021) 90 tablet 1   pantoprazole (PROTONIX) 40 MG tablet Take 1 tablet (40 mg total) by mouth daily before breakfast. 90 tablet 1   tiotropium (SPIRIVA HANDIHALER) 18 MCG inhalation capsule INHALE 1 CAPSULE VIA HANDIHALER ONCE DAILY AT THE SAME TIME EVERY DAY 90 capsule 1   No current facility-administered medications for this visit.    OBJECTIVE: There were no vitals filed for this visit.    There is no height or weight on file to calculate BMI.    ECOG FS:0 - Asymptomatic  General: Well-developed, well-nourished, no acute distress. Eyes: Pink conjunctiva, anicteric sclera. HEENT: Normocephalic, moist mucous membranes. Lungs: No audible wheezing or coughing. Heart: Regular rate and rhythm. Abdomen: Soft, nontender, no obvious distention. Musculoskeletal: No edema, cyanosis, or clubbing. Neuro: Alert, answering all questions appropriately. Cranial nerves grossly intact. Skin: No rashes or petechiae noted. Psych: Normal affect.   LAB RESULTS:  Lab Results  Component Value Date   NA 139 02/16/2019   K 4.0 02/16/2019   CL 105 02/16/2019   CO2 25 02/16/2019   GLUCOSE 123 (H) 02/16/2019   BUN 13 02/16/2019   CREATININE 1.20 01/21/2021   CALCIUM 8.8 (L) 02/16/2019   PROT 6.9 02/16/2019   ALBUMIN 3.8 02/16/2019   AST 15 02/16/2019   ALT 9 02/16/2019   ALKPHOS 59 02/16/2019   BILITOT 0.4 02/16/2019   GFRNONAA >60 02/16/2019   GFRAA >60 02/16/2019    Lab Results  Component Value Date   WBC 6.8 02/16/2019   NEUTROABS 4.3 02/16/2019   HGB 12.6 (L) 02/16/2019   HCT 38.2 (L) 02/16/2019   MCV 91.2 02/16/2019   PLT 229 02/16/2019     STUDIES: No results found.   ASSESSMENT: Stage  IIa small cell carcinoma of the right lung.  PLAN:    1. 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  CT scan results from January 22, 2021 reviewed independently and reported as above with unchanged appearance of right upper lobe mass, but patient does have interval enlargement of right lower lobe lesion now measuring 1.3 x 1.0 cm.  Suspect this is a synchronous primary and not recurrence.  Patient has appointment with radiation oncology later today.  I have also ordered PET scan for further evaluation.  Follow-up will be based on radiation oncology recommendations.  2.  Pain: Resolved.  Patient does not complain of pain today.  Given patient's history of substance abuse, he will not be given narcotics from this clinic.   3. History of melanoma: Patient is a poor historian but appears to have been  greater than 5 years ago. Monitor.  I spent a total of 20 minutes reviewing chart data, face-to-face evaluation with the patient, counseling and coordination of care as detailed above.    Patient expressed understanding and was in agreement with this plan. He also understands that He can call clinic at any time with any questions, concerns, or complaints.   Lloyd Huger, MD   06/18/2021 12:52 PM

## 2021-06-22 ENCOUNTER — Ambulatory Visit
Admission: RE | Admit: 2021-06-22 | Discharge: 2021-06-22 | Disposition: A | Discharge status: Home / Self Care | Payer: Medicaid Other | Source: Ambulatory Visit | Attending: Radiation Oncology | Admitting: Radiation Oncology

## 2021-06-22 ENCOUNTER — Other Ambulatory Visit: Payer: Self-pay

## 2021-06-22 DIAGNOSIS — C3490 Malignant neoplasm of unspecified part of unspecified bronchus or lung: Secondary | ICD-10-CM | POA: Diagnosis present

## 2021-06-22 LAB — POCT I-STAT CREATININE: Creatinine, Ser: 0.9 mg/dL (ref 0.61–1.24)

## 2021-06-22 MED ORDER — IOHEXOL 300 MG/ML  SOLN
75.0000 mL | Freq: Once | INTRAMUSCULAR | Status: AC | PRN
  Administered 2021-06-22: 75 mL via INTRAVENOUS

## 2021-06-25 ENCOUNTER — Inpatient Hospital Stay: Payer: Medicaid Other | Admitting: Oncology

## 2021-06-25 ENCOUNTER — Ambulatory Visit: Payer: Medicaid Other | Admitting: Radiation Oncology

## 2021-06-25 DIAGNOSIS — Z79899 Other long term (current) drug therapy: Secondary | ICD-10-CM | POA: Insufficient documentation

## 2021-06-25 DIAGNOSIS — F17210 Nicotine dependence, cigarettes, uncomplicated: Secondary | ICD-10-CM | POA: Insufficient documentation

## 2021-06-25 DIAGNOSIS — C3431 Malignant neoplasm of lower lobe, right bronchus or lung: Secondary | ICD-10-CM | POA: Insufficient documentation

## 2021-06-25 DIAGNOSIS — I10 Essential (primary) hypertension: Secondary | ICD-10-CM | POA: Insufficient documentation

## 2021-06-25 DIAGNOSIS — Z87891 Personal history of nicotine dependence: Secondary | ICD-10-CM | POA: Insufficient documentation

## 2021-06-25 DIAGNOSIS — C3490 Malignant neoplasm of unspecified part of unspecified bronchus or lung: Secondary | ICD-10-CM

## 2021-06-25 DIAGNOSIS — Z85820 Personal history of malignant melanoma of skin: Secondary | ICD-10-CM | POA: Insufficient documentation

## 2021-06-29 ENCOUNTER — Ambulatory Visit: Payer: Medicaid Other | Admitting: Radiation Oncology

## 2021-06-30 ENCOUNTER — Inpatient Hospital Stay (HOSPITAL_BASED_OUTPATIENT_CLINIC_OR_DEPARTMENT_OTHER): Payer: Medicaid Other | Admitting: Oncology

## 2021-06-30 ENCOUNTER — Ambulatory Visit
Admission: RE | Admit: 2021-06-30 | Discharge: 2021-06-30 | Disposition: A | Discharge status: Home / Self Care | Payer: Medicaid Other | Source: Ambulatory Visit | Attending: Radiation Oncology | Admitting: Radiation Oncology

## 2021-06-30 ENCOUNTER — Other Ambulatory Visit: Payer: Self-pay

## 2021-06-30 ENCOUNTER — Encounter: Payer: Self-pay | Admitting: Oncology

## 2021-06-30 VITALS — BP 107/64 | HR 84 | Temp 97.2°F | Wt 119.0 lb

## 2021-06-30 VITALS — BP 99/64 | HR 90 | Temp 98.0°F | Wt 119.6 lb

## 2021-06-30 DIAGNOSIS — C3490 Malignant neoplasm of unspecified part of unspecified bronchus or lung: Secondary | ICD-10-CM | POA: Diagnosis not present

## 2021-06-30 DIAGNOSIS — C3431 Malignant neoplasm of lower lobe, right bronchus or lung: Secondary | ICD-10-CM | POA: Diagnosis not present

## 2021-06-30 DIAGNOSIS — Z79899 Other long term (current) drug therapy: Secondary | ICD-10-CM | POA: Insufficient documentation

## 2021-06-30 DIAGNOSIS — Z85820 Personal history of malignant melanoma of skin: Secondary | ICD-10-CM | POA: Diagnosis not present

## 2021-06-30 DIAGNOSIS — Z9221 Personal history of antineoplastic chemotherapy: Secondary | ICD-10-CM | POA: Insufficient documentation

## 2021-06-30 DIAGNOSIS — F17210 Nicotine dependence, cigarettes, uncomplicated: Secondary | ICD-10-CM | POA: Insufficient documentation

## 2021-06-30 DIAGNOSIS — I10 Essential (primary) hypertension: Secondary | ICD-10-CM | POA: Insufficient documentation

## 2021-06-30 DIAGNOSIS — Z923 Personal history of irradiation: Secondary | ICD-10-CM | POA: Diagnosis not present

## 2021-06-30 NOTE — Progress Notes (Signed)
Radiation Oncology Follow up Note  Name: David [name]   Date:   06/30/2021 MRN:  [id number] DOB: [birth year]    This 65 y.o. male presents to the clinic today for 73-month follow-up.  Status post SBRT treatment for his presumed stage I non-small cell lung cancer superior segment right lower lobe in patient treated for small cell lung cancer over 5 years prior  REFERRING PROVIDER: Nobie Putnam *  HPI: Patient is a 65 year old male now out 4 months having completed SBRT to superior segment of right lower lobe.  Patient previously received concurrent chemoradiation for small cell lung cancer 5 years prior seen today in routine follow-up he is doing fairly well he was had a chest cold last week for which he is recovered from.  Specifically [place] significant cough hemoptysis or chest tightness..  He had a CT scan of his chest recently showing no substantial interval change in spiculated right upper lobe lesion as well as a 13 mm right lower lobe nodule both stable on reimaging.  He also has multiple scattered tiny bilateral pulmonary nodules which are stable over time.  COMPLICATIONS OF TREATMENT: none  FOLLOW UP COMPLIANCE: keeps appointments   PHYSICAL EXAM:  BP 99/64   Pulse 90   Temp 98 F (36.7 C) (Tympanic)   Wt 119 lb 9.6 oz (54.3 kg)   SpO2 98%   BMI 17.66 kg/m  Well-developed well-nourished patient in NAD. HEENT reveals PERLA, EOMI, discs not visualized.  Oral cavity is clear. No oral mucosal lesions are identified. Neck is clear without evidence of cervical or supraclavicular adenopathy. Lungs are clear to A&P. Cardiac examination is essentially unremarkable with regular rate and rhythm without murmur rub or thrill. Abdomen is benign with no organomegaly or masses noted. Motor sensory and DTR levels are equal and symmetric in the upper and lower extremities. Cranial nerves II through XII are grossly intact. Proprioception is intact. No peripheral adenopathy or edema is  identified. No motor or sensory levels are noted. Crude visual fields are within normal range.  RADIOLOGY RESULTS: CT scans reviewed compatible with above-stated findings  PLAN: Present time his chest is stable by CT criteria over time.  And pleased with his overall progress.  I have asked to see him back in 6 months for follow-up with a CT scan at that time.  He has follow-up today with medical oncology.  Patient knows to call with any concerns.  I would like to take this opportunity to thank you for allowing me to participate in the care of your patient.Noreene Filbert, MD

## 2021-06-30 NOTE — Progress Notes (Signed)
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  INTERVAL HISTORY: Patient returns to clinic today for further evaluation and discussion of his imaging results.  He continues to feel well and remains asymptomatic.  He has no neurologic complaints.  He denies any recent fevers or illnesses.  He has a good appetite and denies weight loss.  He denies any chest pain, shortness of breath, cough, or hemoptysis.  He denies any nausea, vomiting, constipation, or diarrhea. He has no urinary complaints.  Patient offers no specific complaints today.  REVIEW OF SYSTEMS:   Review of Systems  Constitutional: Negative.  Negative for fever, malaise/fatigue and weight loss.  HENT:  Negative for tinnitus.   Respiratory: Negative.  Negative for cough, hemoptysis and shortness of breath.   Cardiovascular: Negative.  Negative for chest pain and leg swelling.  Gastrointestinal: Negative.  Negative for abdominal pain.  Genitourinary: Negative.  Negative for dysuria.  Musculoskeletal: Negative.  Negative for joint pain and myalgias.  Skin: Negative.  Negative for rash.  Neurological: Negative.  Negative for sensory change, focal weakness, weakness and headaches.  Psychiatric/Behavioral: Negative.  The patient is not nervous/anxious.    As per HPI. Otherwise, a complete review of systems is negative.  PAST MEDICAL HISTORY: Past Medical History:  Diagnosis Date   Arthritis    Asthma    COPD (chronic obstructive pulmonary disease) ([place])    not on home o2   Gout    History of kidney stones    History of melanoma    Hypertension    Oxygen deficiency    O2 1 LITER [facility]   Pain    chronic pain all over   Small cell lung cancer ([place])    melanoma   Tobacco use      PAST SURGICAL HISTORY: Past Surgical History:  Procedure Laterality Date   ELECTROMAGNETIC NAVIGATION BROCHOSCOPY N/A 07/22/2016   Procedure: ELECTROMAGNETIC NAVIGATION BRONCHOSCOPY;  Surgeon: Flora Lipps, MD;  Location: [facility] ORS;  Service: Cardiopulmonary;  Laterality: N/A;   PERIPHERAL VASCULAR CATHETERIZATION N/A 09/15/2015   Procedure: Glori Luis Cath Insertion;  Surgeon: Algernon Huxley, MD;  Location: [place] [facility];  Service: Cardiovascular;  Laterality: N/A;   PORTA CATH REMOVAL N/A 05/05/2017   Procedure: Glori Luis Cath Removal;  Surgeon: Algernon Huxley, MD;  Location: [place] [facility];  Service: Cardiovascular;  Laterality: N/A;   SKIN GRAFT     [facility]    FAMILY HISTORY: Reviewed and unchanged. No reported history of malignancy or chronic disease.     ADVANCED DIRECTIVES:    HEALTH MAINTENANCE: Social History   Tobacco Use   Smoking status: Every Day    Packs/day: 1.00    Years: 51.00    Pack years: 51.00    Types: Cigarettes    Start date: 08/12/1994   Smokeless tobacco: Never   Tobacco comments:    used to smoke upto 2PPD, recently cut down  Vaping Use   Vaping Use: Never used  Substance Use Topics   Alcohol use: No    Alcohol/week: 0.0 standard drinks   Drug use: No    Comment: approx. a month quit     Colonoscopy:  PAP:  Bone density:  Lipid panel:  Allergies  Allergen Reactions   Tylenol [Acetaminophen]  Other (See Comments)    Unable to take due to chemotherapy regimen   Vicodin [Hydrocodone-Acetaminophen] Nausea And Vomiting and Rash    Current Outpatient Medications  Medication Sig Dispense Refill   albuterol (PROAIR HFA) 108 (90 Base) MCG/ACT inhaler Inhale 2 puffs into the lungs every 6 (six) hours as needed for wheezing or shortness of breath. 8.5 each 5   baclofen (LIORESAL) 10 MG tablet Take 0.5-1 tablets (5-10 mg total) by mouth 3 (three) times daily as needed for muscle spasms. 30 tablet 3   mirtazapine (REMERON) 15 MG tablet Take 1 tablet  (15 mg total) by mouth at bedtime. 90 tablet 1   pantoprazole (PROTONIX) 40 MG tablet Take 1 tablet (40 mg total) by mouth daily before breakfast. 90 tablet 1   tiotropium (SPIRIVA HANDIHALER) 18 MCG inhalation capsule INHALE 1 CAPSULE VIA HANDIHALER ONCE DAILY AT THE SAME TIME EVERY DAY 90 capsule 1   gabapentin (NEURONTIN) 300 MG capsule Take 1 capsule (300 mg total) by mouth 3 (three) times daily. (Patient not taking: Reported on 02/04/2021) 270 capsule 1   No current facility-administered medications for this visit.    OBJECTIVE: Vitals:   06/30/21 1002  BP: 107/64  Pulse: 84  Temp: (!) 97.2 F (36.2 C)      Body mass index is 17.57 kg/m.    ECOG FS:0 - Asymptomatic  General: Well-developed, well-nourished, no acute distress. Eyes: Pink conjunctiva, anicteric sclera. HEENT: Normocephalic, moist mucous membranes. Lungs: No audible wheezing or coughing. Heart: Regular rate and rhythm. Abdomen: Soft, nontender, no obvious distention. Musculoskeletal: No edema, cyanosis, or clubbing. Neuro: Alert, answering all questions appropriately. Cranial nerves grossly intact. Skin: No rashes or petechiae noted. Psych: Normal affect.  LAB RESULTS:  Lab Results  Component Value Date   NA 139 02/16/2019   K 4.0 02/16/2019   CL 105 02/16/2019   CO2 25 02/16/2019   GLUCOSE 123 (H) 02/16/2019   BUN 13 02/16/2019   CREATININE 0.90 06/22/2021   CALCIUM 8.8 (L) 02/16/2019   PROT 6.9 02/16/2019   ALBUMIN 3.8 02/16/2019   AST 15 02/16/2019   ALT 9 02/16/2019   ALKPHOS 59 02/16/2019   BILITOT 0.4 02/16/2019   GFRNONAA >60 02/16/2019   GFRAA >60 02/16/2019    Lab Results  Component Value Date   WBC 6.8 02/16/2019   NEUTROABS 4.3 02/16/2019   HGB 12.6 (L) 02/16/2019   HCT 38.2 (L) 02/16/2019   MCV 91.2 02/16/2019   PLT 229 02/16/2019     STUDIES: CT CHEST W CONTRAST  Result Date: 06/23/2021 CLINICAL DATA:  Right lower lobe lung nodule.  Restaging. EXAM: CT CHEST WITH  CONTRAST TECHNIQUE: Multidetector CT imaging of the chest was performed during intravenous contrast administration. CONTRAST:  54mL OMNIPAQUE IOHEXOL 300 MG/ML  SOLN COMPARISON:  PET-CT 02/18/2021 FINDINGS: Cardiovascular: The heart size is normal. No substantial pericardial effusion. Coronary artery calcification is evident. Moderate atherosclerotic calcification is noted in the wall of the thoracic aorta. Mediastinum/Nodes: No mediastinal lymphadenopathy. There is no hilar lymphadenopathy. Soft tissue attenuation in the right hilar region is presumably treatment related. The esophagus has normal imaging features. Stable lymphadenopathy in the left axilla including 11 mm short axis lymph node on 15/2. Lungs/Pleura: Centrilobular and paraseptal emphysema evident. Stable 6 mm posterior right upper lobe nodule on 38/3. Irregular spiculated right upper lobe lesion is similar to prior diagnostic chest CT of 01/21/2021, measuring 2.9 x 2.3 cm today compared to 3.3 x 2.2 cm on that exam. This lesion  showed moderate metabolic activity on previous PET-CT of 02/18/2021. The perifissural right lower lobe pulmonary nodule is stable since 01/21/2021 measuring 1.3 x 1.0 cm today compared to 1.3 x 1.0 cm on that exam. This lesion showed low metabolic activity on the previous PET-CT. 5 mm parahilar right lung nodule on 87/3 is stable. Multiple perifissural nodules in the right lung (images 86, 91, and 93 of series 3 are stable. 6 mm right lower lobe subpleural nodule on 01/20 9/3 is unchanged. Multiple 5-6 mm parenchymal and perifissural nodules in the left lung are also stable. No focal airspace consolidation. There is no evidence of pleural effusion. Upper Abdomen: Calcified gallstone evident. There is moderate atherosclerotic calcification of the abdominal aorta without aneurysm. Musculoskeletal: No worrisome lytic or sclerotic osseous abnormality. IMPRESSION: 1. No substantial interval change in the spiculated right upper lobe  pulmonary lesion with low metabolic activity on the previous PET-CT of 02/18/2021. Imaging features remain compatible with treatment related change. 2. 13 mm right lower lobe nodule of concern on prior imaging is stable since CT chest 01/21/2021. This lesion showed lobe metabolic activity on the previous PET-CT. The interval stability is reassuring, but continued close attention recommended. 3. Multiple scattered tiny bilateral pulmonary nodules are stable in the interval, likely benign. 4. Stable left axillary lymphadenopathy. 5. Cholelithiasis. 6. Aortic Atherosclerosis (ICD10-I70.0) and Emphysema (ICD10-J43.9). Electronically Signed   By: Misty Stanley M.D.   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  PLAN:    1. 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  CT scan results from June 22, 2021 reviewed independently and report as above with no obvious evidence of recurrent or progressive disease.  Continue active surveillance.  Repeat CT scan in 6 months with follow-up 1 to 2 days later. 2.  Right lower lobe malignancy: CT scan on January 21, 2021 revealed an enlarging right lower lobe lesion measuring 1.3 x 1.0 cm.  Suspect this was a synchronous primary and not recurrence.  Patient has now completed SBRT to this lesion.  Repeat CT scan as above.  Follow-up in 6 months. 3.  Pain: Resolved.  Patient does not complain of pain today.  Given patient's history of substance abuse, he will not be given narcotics from this clinic.   4. History of melanoma: Patient is a poor historian but appears to have been greater than 10 years ago. Monitor.  I spent a total of 20 minutes reviewing chart data, face-to-face evaluation with the patient, counseling and coordination of care as detailed above.   Patient expressed understanding and was in agreement with this plan. He also understands that He can call clinic at  any time with any questions, concerns, or complaints.   Lloyd Huger, MD   06/30/2021 12:50 PM

## 2021-08-09 DIAGNOSIS — I639 Cerebral infarction, unspecified: Secondary | ICD-10-CM

## 2021-08-09 HISTORY — DX: Cerebral infarction, unspecified: I63.9

## 2021-09-16 ENCOUNTER — Ambulatory Visit: Payer: Medicaid Other | Admitting: Family Medicine

## 2021-09-16 ENCOUNTER — Ambulatory Visit: Payer: Self-pay

## 2021-09-16 NOTE — Telephone Encounter (Signed)
°  Chief Complaint: patient returned call requesting medication for difficulty breathing,  Symptoms: chest pain, difficulty breathing, speaking few words at a time. Has used inhalers with no relief  Frequency: x 3 weeks now worse Pertinent Negatives: Patient denies na  Disposition: [x] ED /[] Urgent Care (no appt availability in office) / [] Appointment(In office/virtual)/ []  [place] [facility]/ [] Home Care/ [] Refused Recommended Disposition /[] [place] [facility]/ []  Follow-up with PCP Additional Notes:   Due to worsening symptoms NT called 911 for patient . Call disconnected with patient and NT attempted to call patient back and phone disconnected again. 2 nd attempt left patient message 911 has been called to residence. Appt scheduled previously for tomorrow  has not been cancelled at this time.   Reason for Disposition  Sounds like a life-threatening emergency to the triager  Answer Assessment - Initial Assessment Questions 1. RESPIRATORY STATUS: "Describe your breathing?" (e.g., wheezing, shortness of breath, unable to speak, severe coughing)      Shortness of breath at rest , coughing  2. ONSET: "When did this breathing problem begin?"      3 weeks ago and getting worse 3. PATTERN "Does the difficult breathing come and go, or has it been constant since it started?"      Constant  4. SEVERITY: "How bad is your breathing?" (e.g., mild, moderate, severe)    - MILD: No SOB at rest, mild SOB with walking, speaks normally in sentences, can lie down, no retractions, pulse < 100.    - MODERATE: SOB at rest, SOB with minimal exertion and prefers to sit, cannot lie down flat, speaks in phrases, mild retractions, audible wheezing, pulse 100-120.    - SEVERE: Very SOB at rest, speaks in single words, struggling to breathe, sitting hunched forward, retractions, pulse > 120      Moderate  5. RECURRENT SYMPTOM: "Have you had difficulty breathing before?" If Yes, ask: "When was the last time?" and  "What happened that time?"      Yes hx lung CA 6. CARDIAC HISTORY: "Do you have any history of heart disease?" (e.g., heart attack, angina, bypass surgery, angioplasty)      See hx  7. LUNG HISTORY: "Do you have any history of lung disease?"  (e.g., pulmonary embolus, asthma, emphysema)     Lung CA 8. CAUSE: "What do you think is causing the breathing problem?"      Not wearing oxygen 9. OTHER SYMPTOMS: "Do you have any other symptoms? (e.g., dizziness, runny nose, cough, chest pain, fever)     Chest pain , cough, difficulty breathing  10. O2 SATURATION MONITOR:  "Do you use an oxygen saturation monitor (pulse oximeter) at home?" If Yes, "What is your reading (oxygen level) today?" "What is your usual oxygen saturation reading?" (e.g., 95%)       na 11. PREGNANCY: "Is there any chance you are pregnant?" "When was your last menstrual period?"       na 12. TRAVEL: "Have you traveled out of the country in the last month?" (e.g., travel history, exposures)       na  Protocols used: Breathing Difficulty-A-AH

## 2021-09-16 NOTE — Telephone Encounter (Signed)
° ° °  Chief Complaint: Non-productive cough Symptoms: Cough,SOB Frequency: Started in November Pertinent Negatives: Patient denies fever Disposition: [] ED /[] Urgent Care (no appt availability in office) / [x] Appointment(In office/virtual)/ []  [place] [facility]/ [] Home Care/ [] Refused Recommended Disposition /[] [place] [facility]/ []  Follow-up with PCP Additional Notes: Has not done  a COVID 19 test.  Reason for Disposition  [1] Longstanding difficulty breathing (e.g., CHF, COPD, emphysema) AND [2] WORSE than normal  Answer Assessment - Initial Assessment Questions 1. RESPIRATORY STATUS: "Describe your breathing?" (e.g., wheezing, shortness of breath, unable to speak, severe coughing)      Cough, SOB 2. ONSET: "When did this breathing problem begin?"      November 3. PATTERN "Does the difficult breathing come and go, or has it been constant since it started?"      Constant 4. SEVERITY: "How bad is your breathing?" (e.g., mild, moderate, severe)    - MILD: No SOB at rest, mild SOB with walking, speaks normally in sentences, can lie down, no retractions, pulse < 100.    - MODERATE: SOB at rest, SOB with minimal exertion and prefers to sit, cannot lie down flat, speaks in phrases, mild retractions, audible wheezing, pulse 100-120.    - SEVERE: Very SOB at rest, speaks in single words, struggling to breathe, sitting hunched forward, retractions, pulse > 120      Moderate 5. RECURRENT SYMPTOM: "Have you had difficulty breathing before?" If Yes, ask: "When was the last time?" and "What happened that time?"      Yes 6. CARDIAC HISTORY: "Do you have any history of heart disease?" (e.g., heart attack, angina, bypass surgery, angioplasty)      Yes 7. LUNG HISTORY: "Do you have any history of lung disease?"  (e.g., pulmonary embolus, asthma, emphysema)     Yes 8. CAUSE: "What do you think is causing the breathing problem?"      Unsure 9. OTHER SYMPTOMS: "Do you have any other symptoms?  (e.g., dizziness, runny nose, cough, chest pain, fever)     Cough, runny nose 10. O2 SATURATION MONITOR:  "Do you use an oxygen saturation monitor (pulse oximeter) at home?" If Yes, "What is your reading (oxygen level) today?" "What is your usual oxygen saturation reading?" (e.g., 95%)       No 11. PREGNANCY: "Is there any chance you are pregnant?" "When was your last menstrual period?"       N/a 12. TRAVEL: "Have you traveled out of the country in the last month?" (e.g., travel history, exposures)       No  Protocols used: Breathing Difficulty-A-AH

## 2021-09-16 NOTE — Telephone Encounter (Signed)
Patient called, left VM to return the call to the office to discuss symptoms with a nurse.   Summary: Patient cancelled appointment but still having breathing difficulth   Patient friend called in and rescheduled his appointment because patient could not get off the couch to go for his visit fearing that the Dr will put him in the hospital. Still having a hard time with his breathing and coughing. She states that she will call the ambulance if things get any worse later on. Please advise Ph# [phone]

## 2021-09-16 NOTE — Telephone Encounter (Signed)
Left message to call back about symptoms.

## 2021-09-17 ENCOUNTER — Ambulatory Visit: Payer: Self-pay | Admitting: *Deleted

## 2021-09-17 ENCOUNTER — Ambulatory Visit: Payer: Medicaid Other | Admitting: Family Medicine

## 2021-09-17 NOTE — Telephone Encounter (Signed)
David [name] calling in wanting [place] to call the ED  Reason for Disposition  Requesting regular office appointment    Appt made for 09/18/2021.  See notes for details  Answer Assessment - Initial Assessment Questions 1. REASON FOR CALL or QUESTION: "What is your reason for calling today?" or "How can I best help you?" or "What question do you have that I can help answer?"     David [name], his good friend that is on the [facility] calling in wanting [place] to call the ED and let them know she is bringing him in and to take him straight to the back upon arrival.   He is refusing to come to the dr.   (Missed his 8:00 this morning with Dr. Parks Ranger "because he won't get his butt off the couch) so I'm going to bring him to the ED but I need y'all to call ahead.  They had EMS come out last night and he refused to go to the ED then too or to take any oxygen.    "He is being so stubborn".  "He needs to be seen by a dr he is not getting any better".   "He keeps refusing everything".    I let her know we can't do that.   The ED triages the pts as they come in based on acuity.   I answered her questions regarding bringing him to the ED and why ED does not take appts also.  She asked that I reschedule him for today with Dr. Parks Ranger and she would somehow get him there no matter what.  No appts today but I did reschedule him for 09/18/2021 at 8:20 with Dr. Parks Ranger.    "I will get him there no matter what just make the appt" per David [name].     She is needing a note for court when they come in tomorrow morning.   I let her know to mention it to Dr. Parks Ranger while they were here.   She verbalized understanding and thanked me for my help.  Protocols used: Information Only Call - No Triage-A-AH

## 2021-09-18 ENCOUNTER — Other Ambulatory Visit: Payer: Self-pay | Admitting: Family Medicine

## 2021-09-18 ENCOUNTER — Encounter: Payer: Self-pay | Admitting: Family Medicine

## 2021-09-18 ENCOUNTER — Other Ambulatory Visit: Payer: Self-pay

## 2021-09-18 ENCOUNTER — Ambulatory Visit (INDEPENDENT_AMBULATORY_CARE_PROVIDER_SITE_OTHER): Payer: Medicaid Other | Admitting: Family Medicine

## 2021-09-18 ENCOUNTER — Telehealth: Payer: Self-pay | Admitting: Family Medicine

## 2021-09-18 VITALS — BP 111/72 | HR 107 | Ht 69.0 in | Wt 117.2 lb

## 2021-09-18 DIAGNOSIS — J432 Centrilobular emphysema: Secondary | ICD-10-CM

## 2021-09-18 DIAGNOSIS — J441 Chronic obstructive pulmonary disease with (acute) exacerbation: Secondary | ICD-10-CM

## 2021-09-18 DIAGNOSIS — C3490 Malignant neoplasm of unspecified part of unspecified bronchus or lung: Secondary | ICD-10-CM

## 2021-09-18 DIAGNOSIS — E43 Unspecified severe protein-calorie malnutrition: Secondary | ICD-10-CM

## 2021-09-18 MED ORDER — BREZTRI AEROSPHERE 160-9-4.8 MCG/ACT IN AERO: 2.0000 | INHALATION_SPRAY | Freq: Two times a day (BID) | RESPIRATORY_TRACT | 11 refills | Status: DC

## 2021-09-18 MED ORDER — LEVOFLOXACIN 25 MG/ML PO SOLN: 500.0000 mg | Freq: Every day | ORAL | 0 refills | Status: DC

## 2021-09-18 MED ORDER — PREDNISONE 20 MG PO TABS: ORAL_TABLET | ORAL | 0 refills | Status: DC

## 2021-09-18 NOTE — Telephone Encounter (Signed)
Requested medications are due for refill today.  unsure  Requested medications are on the active medications list.  no  Last refill. The chart says 09/18/2021  Future visit scheduled.   no  Notes to clinic. please review. This medication is not on med list. Unsure if this should be filled.    Requested Prescriptions  Pending Prescriptions Disp Refills   ciprofloxacin (CIPRO) 500 MG tablet [Pharmacy Med Name: CIPROFLOXACIN HCL 500 MG TAB]  0     Off-Protocol Failed - 09/18/2021 12:43 PM      Failed - Medication not assigned to a protocol, review manually.      Passed - Valid encounter within last 12 months    Recent Outpatient Visits           Today Centrilobular emphysema [facility])   [place], DO   9 months ago Bilateral impacted cerumen   [place], NP   1 year ago Centrilobular emphysema [facility])   [place], DO   3 years ago Breast pain, left   [place], DO   3 years ago Chronic obstructive pulmonary disease, unspecified COPD type [facility])   [facility], Devonne Doughty, DO

## 2021-09-18 NOTE — Progress Notes (Signed)
Subjective:    Patient ID: David [name], male    DOB: 31-Aug-1955, 66 y.o.   MRN: [id number]  ESTEVEN [name] is a 66 y.o. 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 See detailed Oncology notes for updates on status.  Has upcoming CT surveillance  He needs refill on Spiriva and Albuterol PRN  Recent significant worsening dyspnea productive cough. Nebulizer treatment last night Persistent coughing spell   Insomnia Chronic problem. Not having symptoms that keep him awake with pain or breathing at night.  Denies depression   GERD Chronic stomach acid symptoms, needs refill PPI   Depression screen [facility] 2/9 09/18/2021 08/22/2020 01/09/2018  Decreased Interest 0 0 0  Down, Depressed, Hopeless 3 0 0  PHQ - 2 Score 3 0 0  Altered sleeping 3 - -  Tired, decreased energy 3 - -  Change in appetite 3 - -  Feeling bad or failure about yourself  3 - -  Trouble concentrating 0 - -  Moving slowly or fidgety/restless 0 - -  Suicidal thoughts 0 - -  PHQ-9 Score 15 - -  Difficult doing work/chores Not difficult at all - -  Some recent data might be hidden    Social History   Tobacco Use   Smoking status: Every Day    Packs/day: 1.00    Years: 51.00    Pack years: 51.00    Types: Cigarettes    Start date: 08/12/1994   Smokeless tobacco: Never   Tobacco comments:    used to smoke upto 2PPD, recently cut down  Vaping Use   Vaping Use: Never used  Substance Use Topics   Alcohol use: No    Alcohol/week: 0.0 standard drinks   Drug use: No    Comment: approx. a month quit    Review of Systems Per HPI unless specifically indicated above     Objective:    BP  111/72    Pulse (!) 107    Ht 5\' 9"  (1.753 m)    Wt 117 lb 3.2 oz (53.2 kg)    SpO2 100%    BMI 17.31 kg/m   Wt Readings from Last 3 Encounters:  09/18/21 117 lb 3.2 oz (53.2 kg)  06/30/21 119 lb 9.6 oz (54.3 kg)  06/30/21 119 lb (54 kg)    Physical Exam Vitals and nursing note reviewed.  Constitutional:      General: He is not in acute distress.    Appearance: He is well-developed. He is not diaphoretic.     Comments: Chronically ill age 84 year old frail male, cooperative  HENT:     Head: Normocephalic and atraumatic.  Eyes:     General:        Right eye: No discharge.        Left eye: No discharge.     Conjunctiva/sclera: Conjunctivae normal.  Neck:     Thyroid: No thyromegaly.  Cardiovascular:     Rate and Rhythm: Normal rate and regular rhythm.     Pulses: Normal pulses.     Heart sounds: Normal heart sounds. No murmur heard. Pulmonary:     Effort: Pulmonary effort  is normal. No respiratory distress.     Breath sounds: Examination of the right-upper field reveals decreased breath sounds. Examination of the left-upper field reveals decreased breath sounds. Examination of the right-middle field reveals decreased breath sounds. Examination of the left-middle field reveals decreased breath sounds. Examination of the right-lower field reveals decreased breath sounds. Examination of the left-lower field reveals decreased breath sounds. Decreased breath sounds present. No wheezing or rales.     Comments: Coughing Musculoskeletal:        General: Normal range of motion.     Cervical back: Normal range of motion and neck supple.  Lymphadenopathy:     Cervical: No cervical adenopathy.  Skin:    General: Skin is warm and dry.     Findings: No erythema or rash.  Neurological:     Mental Status: He is alert and oriented to person, place, and time. Mental status is at baseline.  Psychiatric:        Behavior: Behavior normal.     Comments: Well groomed, good eye contact, normal speech  and thoughts    Results for orders placed or performed during the hospital encounter of 06/22/21  I-STAT creatinine  Result Value Ref Range   Creatinine, Ser 0.90 0.61 - 1.24 mg/dL      Assessment & Plan:   Problem List Items Addressed This Visit     Small cell lung cancer ([place])   Relevant Medications   predniSONE (DELTASONE) 20 MG tablet   levofloxacin (LEVAQUIN) 25 MG/ML solution   Other Relevant Orders   Ambulatory referral to Pulmonology   Amb Referral to Palliative Care   Severe protein-calorie malnutrition ([place])   Relevant Orders   Amb Referral to Palliative Care   Centrilobular emphysema ([place]) - Primary   Relevant Medications   predniSONE (DELTASONE) 20 MG tablet   BREZTRI AEROSPHERE 160-9-4.8 MCG/ACT AERO   Other Relevant Orders   Ambulatory referral to Pulmonology   Amb Referral to Palliative Care   Other Visit Diagnoses     COPD with acute exacerbation ([place])       Relevant Medications   predniSONE (DELTASONE) 20 MG tablet   BREZTRI AEROSPHERE 160-9-4.8 MCG/ACT AERO   levofloxacin (LEVAQUIN) 25 MG/ML solution      Continue with Oncology therapy  Today will treat acute COPD exacerbation Severe Emphysema underlying  Levaquin 500mg  daily x 7 days, liquid.  Prednisone taper  Sample Breztri 2 puffs twice a day. - if successful will order new rx. Stop Spiriva now while on sample.  Previously lost to follow up - Referral back to Pulmonology for further management, will likely need supplement oxygen orders, patient has small cell lung cancer, and advanced emphysema, previously followed at this office. Pulse ox today was within normal range  Referral to Palliative Care for symptom management  Expressed concern with his lack of follow up encouraged him to seek care at hospital ED if severe worsening concerns with breathing.  Orders Placed This Encounter  Procedures   Ambulatory referral to Pulmonology    Referral Priority:   Routine    Referral Type:    Consultation    Referral Reason:   Specialty Services Required    Requested Specialty:   Pulmonary Disease    Number of Visits Requested:   1   Amb Referral to Palliative Care    Referral Priority:   Routine    Referral Type:   Consultation    Number of Visits Requested:   1  Meds ordered this encounter  Medications   predniSONE (DELTASONE) 20 MG tablet    Sig: Take daily with food. Start with 60mg  (3 pills) x 2 days, then reduce to 40mg  (2 pills) x 2 days, then 20mg  (1 pill) x 3 days    Dispense:  13 tablet    Refill:  0   BREZTRI AEROSPHERE 160-9-4.8 MCG/ACT AERO    Sig: Inhale 2 puffs into the lungs 2 (two) times daily.    Dispense:  10.7 g    Refill:  11   levofloxacin (LEVAQUIN) 25 MG/ML solution    Sig: Take 20 mLs (500 mg total) by mouth daily.    Dispense:  140 mL    Refill:  0    Follow up plan: Return if symptoms worsen or fail to improve.  Nobie Putnam, [facility] 09/18/2021, 8:33 AM

## 2021-09-18 NOTE — Telephone Encounter (Signed)
Pt needs medicaid prior authorization on both  BREZTRI AEROSPHERE 160-9-4.8 MCG/ACT AERO levofloxacin (LEVAQUIN) 25 MG/ML solution  [facility] is sending the form now.

## 2021-09-18 NOTE — Patient Instructions (Addendum)
Thank you for coming to the office today.  Levaquin 500mg  daily x 7 days, liquid.  Prednisone taper  Breztri 2 puffs twice a day.  [facility] Pulmonology [address] [place], [place] Phone: [phone]  [facility] Palliative home [address], [place] [address] Ph: [phone]  Please schedule a Follow-up Appointment to: Return if symptoms worsen or fail to improve.  If you have any other questions or concerns, please feel free to call the office or send a message through [place]. You may also schedule an earlier appointment if necessary.  Additionally, you may be receiving a survey about your experience at our office within a few days to 1 week by e-mail or mail. We value your feedback.  Nobie Putnam, DO [place]

## 2021-09-21 MED ORDER — SYMBICORT 160-4.5 MCG/ACT IN AERO: 2.0000 | INHALATION_SPRAY | Freq: Two times a day (BID) | RESPIRATORY_TRACT | 12 refills | Status: DC

## 2021-09-21 MED ORDER — LEVOFLOXACIN 500 MG PO TABS: 500.0000 mg | ORAL_TABLET | Freq: Every day | ORAL | 0 refills | Status: DC

## 2021-09-21 NOTE — Telephone Encounter (Signed)
He does not need PA.  Breztri - should be switched to alternative inhaler if Judithann Sauger is not covered. Will order SYMBICORT inhaler hopefully that is covered instead. He has only been on spiriva before.  2.   Levauin was the liquid solution requested. That is the reason it needs PA. He should have been on it since his visit, this is challenging that we were unaware he did not get the med due to authorization.  I have changed Levaquin to pills for 500mg  so that it will be hopefully now be covered.  Nobie Putnam, DO [facility] 09/21/2021, 10:04 AM

## 2021-09-28 ENCOUNTER — Telehealth: Payer: Self-pay

## 2021-09-28 NOTE — Telephone Encounter (Signed)
Spoke with patient and scheduled a [place] for 09/29/21 @ 2:30 PM.  Consent obtained; updated Outlook/Netsmart/Team List and Epic.

## 2021-09-29 ENCOUNTER — Other Ambulatory Visit: Payer: Medicaid Other | Admitting: Student

## 2021-09-29 ENCOUNTER — Other Ambulatory Visit: Payer: Self-pay

## 2021-09-29 DIAGNOSIS — Z515 Encounter for palliative care: Secondary | ICD-10-CM

## 2021-09-29 DIAGNOSIS — C3490 Malignant neoplasm of unspecified part of unspecified bronchus or lung: Secondary | ICD-10-CM

## 2021-09-29 DIAGNOSIS — R0602 Shortness of breath: Secondary | ICD-10-CM

## 2021-09-29 NOTE — Progress Notes (Signed)
David [name] Consult Note Telephone: [phone]  Fax: [phone]   Date of encounter: 09/29/21 2:59 PM PATIENT NAME: David [name] David [name] David [name] [id number]   There are no phone numbers on file. DOB: [birth year] MRN: [id number] PRIMARY CARE PROVIDER:    Olin Hauser, DO,  [address] David [name] [address] [phone]  REFERRING PROVIDER:   Olin Hauser, DO [address] David [name],  David [name] [address] [phone]  RESPONSIBLE PARTY:    Contact Information     Name Relation Home Work Mobile   David [name] Daughter [phone]  [phone]       Due to the COVID-19 crisis, this visit was done via telemedicine from my office and it was initiated and consent by this patient and or family.  This phone visit was completed via telephone due to the patient's inability to connect via an audiovisual connection or the refusal to have in person visit.  The connection was agreed to by the patient.  I verified that I am speaking with the correct person using two identifiers. I discussed the limitations of evaluation and management by telemedicine. The patient expressed understanding and agreed to proceed.                                     ASSESSMENT AND PLAN / RECOMMENDATIONS:   Advance Care Planning/Goals of Care: Goals include to maximize quality of life and symptom management. Patient/health care surrogate gave his/her permission to discuss.Our advance care planning conversation included a discussion about:    The value and importance of advance care planning  Experiences with loved ones who have been seriously ill or have died  Exploration of personal, cultural or spiritual beliefs that might influence medical decisions  Exploration of goals of care in the event of a sudden injury or illness  No healthcare agent identified. Review and updating or creation of an  advance directive document  . Decision not to resuscitate or to de-escalate disease focused treatments due to poor prognosis. CODE STATUS: To be discussed further; patient is currently listed as a full code but is unsure of CODE STATUS going forward.  Symptom Management/Plan:  Shortness of breath-secondary to COPD, centrilobular emphysema. He endorses shortness of breath with exertion, orthopnea. He is wanting to see if he can qualify for HS oxygen. Continue Symbicort as directed, albuterol PRN as directed. Patient encouraged to complete Levaquin and prednisone taper as directed per PCP. Will see if patient will qualify for oxygen on next home visit. He is to follow up with pulmonology as scheduled.   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 CT scan November 2022 no obvious evidence of recurrence or progressive disease. Patient noted to have an enlarging right lower lobe lesion; suspect that this was synchronous primary and not recurrence. Records state patient has completed SBRT of lesion; although patient states he has not received radiation. He is to have repeat CT scan in 6 months and f/u with oncology as scheduled.  Follow up Palliative Care Visit: Palliative care will continue to follow for complex medical decision making, advance care planning, and clarification of goals. Return 1 weeks or prn.  This visit was coded based on medical decision making (MDM).  PPS: 60%  HOSPICE ELIGIBILITY/DIAGNOSIS: TBD  Chief Complaint: Palliative medicine initial consult.  HISTORY OF PRESENT ILLNESS:  David [name]  David [name] is a 66 y.o. 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  Patient endorses shortness of breath with lying flat, shortness of breath is worse at night.  He endorses cough, no worsening.  He is currently receiving p.o. Levaquin x7 days, and prednisone taper; has 2 days left.  His PCP has  referred him to pulmonology.  PCP had initially ordered Breztri inhaler but due to authorization difficulty, was switched over to Symbicort.  Verified he is taking Symbicort as directed. Patient denies having pain, nausea, constipation. He does require some assistance with ADLs. Fair appetite endorsed. Patient completed chemotherapy for his small cell carcinoma of right lung 12/2015. CT scan November 2022 shows no obvious evidence of recurrence or progressive disease. Patient noted to have an enlarging right lower lobe lesion; suspect that this was synchronous primary and not recurrence. Records state patient has completed SBRT of lesion; although patient states he has not received radiation. He is to have repeat CT scan in 6 months and f/u with oncology as scheduled. A 10-point review of systems is negative, except for the pertinent positives and negatives detailed in the HPI.   History obtained from review of EMR, discussion with primary team, and interview with family, facility staff/caregiver and/or [name].  I reviewed available labs, medications, imaging, studies and related documents from the EMR.  Records reviewed and summarized above.    Physical Exam:  PE deferred due to this being a telemedicine visit.   CURRENT PROBLEM LIST:  Patient Active Problem List   Diagnosis Date Noted   Centrilobular emphysema ([place]) 08/22/2020   Gastroesophageal reflux disease without esophagitis 08/22/2020   Severe protein-calorie malnutrition ([place]) 10/27/2017   Hypertension 04/26/2017   Chronic pain syndrome 09/28/2016   Long term current use of opiate analgesic 09/28/2016   Long term prescription opiate use 09/28/2016   Disturbance of skin sensation 09/28/2016   Lung mass    Anemia 07/19/2016   Multiple lung nodules 07/19/2016   Chest pain 07/18/2016   Primary osteoarthritis involving multiple joints 06/23/2016   Chronic bursitis of left shoulder 06/23/2016   Chronic pain of both shoulders  06/23/2016   History of substance abuse (David [name]) 06/23/2016   Tobacco abuse 04/20/2016   History of melanoma 04/20/2016   COPD (chronic obstructive pulmonary disease) ([place]) 04/20/2016   Gout 04/20/2016   Adjustment disorder 04/20/2016   Insomnia 04/20/2016   Chronic pain due to neoplasm 04/20/2016   Opiate use 04/20/2016   Small cell lung cancer ([place]) 09/08/2015   PAST MEDICAL HISTORY:  Active Ambulatory Problems    Diagnosis Date Noted   Small cell lung cancer ([place]) 09/08/2015   Tobacco abuse 04/20/2016   History of melanoma 04/20/2016   COPD (chronic obstructive pulmonary disease) ([place]) 04/20/2016   Gout 04/20/2016   Adjustment disorder 04/20/2016   Insomnia 04/20/2016   Chronic pain due to neoplasm 04/20/2016   Opiate use 04/20/2016   Primary osteoarthritis involving multiple joints 06/23/2016   Chronic bursitis of left shoulder 06/23/2016   Chronic pain of both shoulders 06/23/2016   History of substance abuse ([place]) 06/23/2016   Chest pain 07/18/2016   Anemia 07/19/2016   Multiple lung nodules 07/19/2016   Lung mass    Chronic pain syndrome 09/28/2016   Long term current use of opiate analgesic 09/28/2016   Long term prescription opiate use 09/28/2016   Disturbance of skin sensation 09/28/2016   Hypertension  04/26/2017   Severe protein-calorie malnutrition ([place]) 10/27/2017   Centrilobular emphysema ([place]) 08/22/2020   Gastroesophageal reflux disease without esophagitis 08/22/2020   Resolved Ambulatory Problems    Diagnosis Date Noted   Sepsis ([place]) 12/31/2015   Past Medical History:  Diagnosis Date   Arthritis    Asthma    History of kidney stones    Oxygen deficiency    Pain    Tobacco use    SOCIAL HX:  Social History   Tobacco Use   Smoking status: Every Day    Packs/day: 1.00    Years: 51.00    Pack years: 51.00    Types: Cigarettes    Start date: 08/12/1994   Smokeless tobacco: Never   Tobacco comments:    used to smoke upto 2PPD, recently cut down   Substance Use Topics   Alcohol use: No    Alcohol/week: 0.0 standard drinks   FAMILY HX:  Family History  Problem Relation Age of Onset   Lung cancer Mother    Heart attack Mother 76   Cirrhosis Father 5      ALLERGIES:  Allergies  Allergen Reactions   Tylenol [Acetaminophen] Other (See Comments)    Unable to take due to chemotherapy regimen   Vicodin [Hydrocodone-Acetaminophen] Nausea And Vomiting and Rash     PERTINENT MEDICATIONS:  Outpatient Encounter Medications as of 09/29/2021  Medication Sig   albuterol (PROAIR HFA) 108 (90 Base) MCG/ACT inhaler Inhale 2 puffs into the lungs every 6 (six) hours as needed for wheezing or shortness of breath.   levofloxacin (LEVAQUIN) 500 MG tablet Take 1 tablet (500 mg total) by mouth daily. For 7 days   predniSONE (DELTASONE) 20 MG tablet Take daily with food. Start with 60mg  (3 pills) x 2 days, then reduce to 40mg  (2 pills) x 2 days, then 20mg  (1 pill) x 3 days   baclofen (LIORESAL) 10 MG tablet Take 0.5-1 tablets (5-10 mg total) by mouth 3 (three) times daily as needed for muscle spasms.   gabapentin (NEURONTIN) 300 MG capsule Take 1 capsule (300 mg total) by mouth 3 (three) times daily. (Patient not taking: Reported on 02/04/2021)   mirtazapine (REMERON) 15 MG tablet Take 1 tablet (15 mg total) by mouth at bedtime.   pantoprazole (PROTONIX) 40 MG tablet Take 1 tablet (40 mg total) by mouth daily before breakfast.   SYMBICORT 160-4.5 MCG/ACT inhaler Inhale 2 puffs into the lungs in the morning and at bedtime.   No facility-administered encounter medications on file as of 09/29/2021.   Thank you for the opportunity to participate in the care of Mr. Pooley.  The palliative care team will continue to follow. Please call our office at [phone] if we can be of additional assistance.   Ezekiel Slocumb, NP   COVID-19 PATIENT SCREENING TOOL Asked and negative response unless otherwise noted:  Have you had symptoms of covid, tested  positive or been in contact with someone with symptoms/positive test in the past 5-10 days? No

## 2021-10-02 ENCOUNTER — Telehealth: Payer: Self-pay

## 2021-10-02 ENCOUNTER — Other Ambulatory Visit: Payer: Medicaid Other | Admitting: Student

## 2021-10-02 ENCOUNTER — Other Ambulatory Visit: Payer: Self-pay

## 2021-10-02 DIAGNOSIS — C3490 Malignant neoplasm of unspecified part of unspecified bronchus or lung: Secondary | ICD-10-CM

## 2021-10-02 DIAGNOSIS — R630 Anorexia: Secondary | ICD-10-CM

## 2021-10-02 DIAGNOSIS — F419 Anxiety disorder, unspecified: Secondary | ICD-10-CM

## 2021-10-02 DIAGNOSIS — R0602 Shortness of breath: Secondary | ICD-10-CM

## 2021-10-02 DIAGNOSIS — Z515 Encounter for palliative care: Secondary | ICD-10-CM

## 2021-10-02 MED ORDER — BREZTRI AEROSPHERE 160-9-4.8 MCG/ACT IN AERO: 2.0000 | INHALATION_SPRAY | Freq: Two times a day (BID) | RESPIRATORY_TRACT | 0 refills | Status: DC

## 2021-10-02 NOTE — Telephone Encounter (Signed)
Patients wife walked into clinic requesting a few inhaler samples for David [name] since they are working well for patient. She said insurance will not cover Breztri or Symbicort.  Dr Raliegh Ip approved to Sample boxes and they were given to patients wife.

## 2021-10-02 NOTE — Progress Notes (Signed)
[facility] Palliative Care Consult Note Telephone: [phone]  Fax: [phone]    Date of encounter: 10/02/21 11:53 AM PATIENT NAME: David [name] [place] [id number]   [phone] (home)  DOB: 03-Mar-1956 MRN: [id number] PRIMARY CARE PROVIDER:    Olin Hauser, DO,  [place] [address] [phone]  REFERRING PROVIDER:   Olin Hauser, DO [address] [place],  [place] [address] [phone]  RESPONSIBLE PARTY:    Contact Information     Name Relation Home Work Mobile   [place] Daughter [phone]  [phone]        I met face to face with patient and family in the home. Palliative Care was asked to follow this patient by consultation request of  Nobie Putnam * to address advance care planning and complex medical decision making. This is a follow up visit.                                   ASSESSMENT AND PLAN / RECOMMENDATIONS:   Advance Care Planning/Goals of Care: Goals include to maximize quality of life and symptom management. Patient/health care surrogate gave his/her permission to discuss. Our advance care planning conversation included a discussion about:    The value and importance of advance care planning  Experiences with loved ones who have been seriously ill or have died  Exploration of personal, cultural or spiritual beliefs that might influence medical decisions  Exploration of goals of care in the event of a sudden injury or illness  CODE STATUS: To be decided.   Symptom Management/Plan:  Shortness of breath- secondary to COPD, centrilobular emphysema. Patient with shortness of breath with exertion, orthopnea. Continue Symbicort as directed. Albuterol PRN; new script sent to pharmacy as he does not have albuterol on hand. Education on smoking cessation. Recommend hospital bed due to his orthopnea.   DME Request for hospital bed:  Due to  diagnosis of COPD patient requires frequent changes in body position and has an immediate need for a change in body position. The patient requires the head of bed to be elevated more than 30 degrees most of the time due to chronic pulmonary disease, centrilobular emphysema.  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 CT scan November 2022 no obvious evidence of recurrence or progressive disease. Patient noted to have an enlarging right lower lobe lesion; suspect that this was synchronous primary and not recurrence. Records state patient has completed SBRT of lesion; although patient states he has not received radiation. He is to have repeat CT scan in 6 months and f/u with oncology as scheduled.  Anxiety-patient expresses increased anxiety, has been itching and scratching his skin. He also endorses some weight loss; uncertain of how much weight loss.  He has taken mirtazapine in the past. Will restart mirtazapine 15 mg QHS to help with his anxiety and appetite.   Appetite-patient with decline in appetite. Weight 117 pounds per patient. Encourage foods patient enjoys; recommend starting nutritional supplement. Will start mirtazapine for anxiety, and to promote appetite.   Patient reports difficulty with paying co-pay for medications. Referral made to palliative SW.   Follow up Palliative Care Visit: Palliative care will continue to follow for complex medical decision making, advance care planning, and clarification of goals. Return in 6-8 weeks or prn.  This visit was coded based on  medical decision making (MDM).  PPS: 60%  HOSPICE ELIGIBILITY/DIAGNOSIS: TBD  Chief Complaint: Palliative Medicine follow up visit.   HISTORY OF PRESENT ILLNESS:  David [name] is a 66 y.o. 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  Patient resides at home. Reports increased  shortness of breath with exertion and cough at night. He is unable to lie flat. Friend reports frequent falls, wears larger shoes due to bone spurs. Endorses some weight loss; is eating some foods he enjoys. States he was around 130 pounds; down to 117 pounds. A 10-point ROS is negative, except for the pertinent positives and negatives detailed per the HPI.  Patient sats remained 97-98% at rest and with exertion.   History obtained from review of EMR, discussion with primary team, and interview with family, facility staff/caregiver and/or Mr. Wicklund.  I reviewed available labs, medications, imaging, studies and related documents from the EMR.  Records reviewed and summarized above.    Physical Exam: Pulse 89, resp 20, b/p 110/56, sats 98% on room air Constitutional: NAD General: frail appearing, thin EYES: anicteric sclera, lids intact, no discharge  ENMT: intact hearing, oral mucous membranes moist CV: S1S2, RRR, no LE edema Pulmonary: Lungs clear, diminished,  increased work of breathing, no cough, room air Abdomen: normo-active BS + 4 quadrants, soft and non tender, no ascites GU: deferred MSK: sarcopenia, moves all extremities, ambulatory Skin: warm and dry, scratches to upper and lower extremities Neuro:  no generalized weakness, A & O x 3 Psych: non-anxious affect, pleasant Hem/lymph/immuno: no widespread bruising   Thank you for the opportunity to participate in the care of Mr. Llorente.  The palliative care team will continue to follow. Please call our office at [phone] if we can be of additional assistance.   Ezekiel Slocumb, NP   COVID-19 PATIENT SCREENING TOOL Asked and negative response unless otherwise noted:   Have you had symptoms of covid, tested positive or been in contact with someone with symptoms/positive test in the past 5-10 days? No

## 2021-10-05 ENCOUNTER — Emergency Department
Admission: EM | Admit: 2021-10-05 | Discharge: 2021-10-05 | Disposition: A | Discharge status: Home / Self Care | Payer: Medicaid Other | Attending: Emergency Medicine | Admitting: Emergency Medicine

## 2021-10-05 ENCOUNTER — Other Ambulatory Visit: Payer: Self-pay

## 2021-10-05 ENCOUNTER — Emergency Department: Payer: Medicaid Other

## 2021-10-05 DIAGNOSIS — W19XXXA Unspecified fall, initial encounter: Secondary | ICD-10-CM

## 2021-10-05 DIAGNOSIS — S7002XA Contusion of left hip, initial encounter: Secondary | ICD-10-CM | POA: Diagnosis not present

## 2021-10-05 DIAGNOSIS — J449 Chronic obstructive pulmonary disease, unspecified: Secondary | ICD-10-CM | POA: Insufficient documentation

## 2021-10-05 DIAGNOSIS — Z85118 Personal history of other malignant neoplasm of bronchus and lung: Secondary | ICD-10-CM | POA: Insufficient documentation

## 2021-10-05 DIAGNOSIS — F419 Anxiety disorder, unspecified: Secondary | ICD-10-CM | POA: Diagnosis not present

## 2021-10-05 DIAGNOSIS — S79912A Unspecified injury of left hip, initial encounter: Secondary | ICD-10-CM | POA: Diagnosis present

## 2021-10-05 MED ORDER — NAPROXEN 500 MG PO TABS: 500.0000 mg | ORAL_TABLET | Freq: Two times a day (BID) | ORAL | 2 refills | Status: DC

## 2021-10-05 MED ORDER — LORAZEPAM 0.5 MG PO TABS: 0.5000 mg | ORAL_TABLET | Freq: Three times a day (TID) | ORAL | 0 refills | Status: AC | PRN

## 2021-10-05 NOTE — ED Triage Notes (Signed)
Pt comes with c/o left rib and hip pain after following Friday.

## 2021-10-05 NOTE — ED Provider Notes (Signed)
[facility] Provider Note    Event Date/Time   First MD Initiated Contact with Patient 10/05/21 1349     (approximate)   History   Fall   HPI  David [name] is a 66 y.o. male with a history of small cell lung cancer, COPD and chronic pain syndromes who presents with complaints of pain in the left hip status post a fall.  He also developed pain in his left lateral chest status post fall patient reports that he fell 2 days ago because he lost his balance.  He has had pain in that area since then.  Does have some low back pain as well but he reports this is chronic.     Physical Exam   Triage Vital Signs: ED Triage Vitals  Enc Vitals Group     BP 10/05/21 1245 100/67     Pulse Rate 10/05/21 1245 (!) 101     Resp 10/05/21 1245 16     Temp 10/05/21 1245 98.6 F (37 C)     Temp Source 10/05/21 1245 Oral     SpO2 10/05/21 1245 97 %     Weight 10/05/21 1336 53.1 kg (117 lb 1 oz)     Height 10/05/21 1336 1.753 m (5\' 9" )     Head Circumference --      Peak Flow --      Pain Score 10/05/21 1233 10     Pain Loc --      Pain Edu? --      Excl. in [place]? --     Most recent vital signs: Vitals:   10/05/21 1245  BP: 100/67  Pulse: (!) 101  Resp: 16  Temp: 98.6 F (37 C)  SpO2: 97%     General: Awake, no distress.  CV:  Good peripheral perfusion.  Mild tenderness palpation the lower left chest wall, no bony abnormalities, no bruising Resp:  Normal effort.  Abd:  No distention.  Other:  Bruise noted to left anterior pelvis, no pain with axial load on the hips, normal range of motion.  No vertebral tenderness to palpation   ED Results / Procedures / Treatments   Labs (all labs ordered are listed, but only abnormal results are displayed) Labs Reviewed - No data to display   EKG     RADIOLOGY     PROCEDURES:  Critical Care performed:   Procedures   MEDICATIONS ORDERED IN ED: Medications - No data to display   IMPRESSION / MDM  / Blanket / ED COURSE  I reviewed the triage vital signs and the nursing notes.    Patient presents after a fall 2 days ago, ambulating well.  Obtained hip x-rays and rib x-rays as he complains of some pain in his left lateral chest as well  X-rays are reassuring, he appears to have primarily suffered a contusion  Patient is also requesting medication for anxiety.  He demonstrates some areas where he has been doing skin picking related to anxiety and requests some "nerve pills ".  He denies SI or HI, will give prescription for 0.5 mg Ativan to take as needed, #12          FINAL CLINICAL IMPRESSION(S) / ED DIAGNOSES   Final diagnoses:  Fall, initial encounter  Anxiety  Contusion of left hip, initial encounter     Rx / DC Orders   ED Discharge Orders          Ordered  LORazepam (ATIVAN) 0.5 MG tablet  Every 8 hours PRN        10/05/21 1402    naproxen (NAPROSYN) 500 MG tablet  2 times daily with meals        10/05/21 1402             Note:  This document was prepared using Dragon voice recognition software and may include unintentional dictation errors.   Lavonia Drafts, MD 10/05/21 [phone]

## 2021-10-05 NOTE — ED Notes (Signed)
See triage note  presents s/p fall  states he fell   hitting left hip and rib pain

## 2021-10-06 ENCOUNTER — Telehealth: Payer: Self-pay

## 2021-10-06 NOTE — Telephone Encounter (Signed)
Order for hospital bed sent to [facility] with required documentation

## 2021-10-07 NOTE — Progress Notes (Signed)
TC to patient per [facility] NP - [place], SW referral request to address needs of medication copay assistance. ? ?Call unsuccessful. ? ?SW will mail medication copay assistance resources to patients address on file along with SW contact information.  ?

## 2021-10-15 ENCOUNTER — Ambulatory Visit: Payer: Self-pay | Admitting: *Deleted

## 2021-10-15 DIAGNOSIS — K068 Other specified disorders of gingiva and edentulous alveolar ridge: Secondary | ICD-10-CM

## 2021-10-15 NOTE — Telephone Encounter (Signed)
Referral submitted. ? ?If patient contacts back with acute concern, then he may need to seek more urgent evaluation at urgent care or ED if acute severe pain and oral swelling. ? ?Nobie Putnam, DO ?[facility] ?[facility] ?10/15/2021, 5:00 PM ? ?

## 2021-10-15 NOTE — Telephone Encounter (Signed)
Summary: tooth pain / referred to dentist  ? Patient states he has no teeth and he feels like a tooth is coming in on his left side, painful and swollen for 3 days. Patient states he needs to be referred to a dentist.   ?  ? ? ? ?Chief Complaint: left side gum swelling ?Symptoms: left upper gum swelling and feels like a tooth. Patient reports he is not supposed to have any teeth. Pain noted small amount of facial swelling  ?Frequency: 3 days ago  ?Pertinent Negatives: Patient denies fever, significant swelling in face ?Disposition: [] ED /[] Urgent Care (no appt availability in office) / [] Appointment(In office/virtual)/ []  [facility]/ [] Home Care/ [] Refused Recommended Disposition /[] [place] [facility]/ [x]  Follow-up with PCP ?Additional Notes:  ? ?Recommended dentist and f/u with PCP if unable to see dentist. Referred patient to   ?[place] accepts Medicaid, number is [phone]. Patient verbalized understanding.  ? ? ?Reason for Disposition ? [1] Face is swollen AND [2] no fever ? ?Answer Assessment - Initial Assessment Questions ?1. LOCATION: "Which tooth is hurting?"  (e.g., right-side/left-side, upper/lower, front/back) ?    Patient reports he does not have teeth but feels a tooth upper left side ?2. ONSET: "When did the toothache start?"  (e.g., hours, days)  ?    3 days ago  ?3. SEVERITY: "How bad is the toothache?"  (Scale 1-10; mild, moderate or severe) ?  - MILD (1-3): doesn't interfere with chewing  ?  - MODERATE (4-7): interferes with chewing, interferes with normal activities, awakens from sleep   ?  - SEVERE (8-10): unable to eat, unable to do any normal activities, excruciating pain    ?    Can chew and eat  ?4. SWELLING: "Is there any visible swelling of your face?" ?    Reports small amount of swelling in face mostly on gum ?5. OTHER SYMPTOMS: "Do you have any other symptoms?" (e.g., fever) ?    No  ?6. PREGNANCY: "Is there any chance you are pregnant?" "When  was your last menstrual period?" ?    na ? ?Protocols used: Toothache-A-AH ? ?

## 2021-11-12 ENCOUNTER — Encounter: Payer: Self-pay | Admitting: Adult Health

## 2021-11-12 ENCOUNTER — Other Ambulatory Visit (HOSPITAL_COMMUNITY): Payer: Self-pay

## 2021-11-12 ENCOUNTER — Telehealth: Payer: Self-pay

## 2021-11-12 ENCOUNTER — Other Ambulatory Visit: Payer: Medicaid Other | Admitting: Student

## 2021-11-12 ENCOUNTER — Ambulatory Visit (INDEPENDENT_AMBULATORY_CARE_PROVIDER_SITE_OTHER): Payer: Medicaid Other | Admitting: Adult Health

## 2021-11-12 DIAGNOSIS — C3490 Malignant neoplasm of unspecified part of unspecified bronchus or lung: Secondary | ICD-10-CM

## 2021-11-12 DIAGNOSIS — Z515 Encounter for palliative care: Secondary | ICD-10-CM

## 2021-11-12 DIAGNOSIS — R1033 Periumbilical pain: Secondary | ICD-10-CM

## 2021-11-12 DIAGNOSIS — J432 Centrilobular emphysema: Secondary | ICD-10-CM | POA: Diagnosis not present

## 2021-11-12 DIAGNOSIS — R0602 Shortness of breath: Secondary | ICD-10-CM

## 2021-11-12 DIAGNOSIS — E43 Unspecified severe protein-calorie malnutrition: Secondary | ICD-10-CM | POA: Diagnosis not present

## 2021-11-12 DIAGNOSIS — J9611 Chronic respiratory failure with hypoxia: Secondary | ICD-10-CM | POA: Diagnosis not present

## 2021-11-12 MED ORDER — BREZTRI AEROSPHERE 160-9-4.8 MCG/ACT IN AERO: 2.0000 | INHALATION_SPRAY | Freq: Two times a day (BID) | RESPIRATORY_TRACT | 11 refills | Status: DC

## 2021-11-12 NOTE — Patient Instructions (Addendum)
Continue on BREZTRI 2 puffs Twice daily  , rinse after use .  ?Albuterol inhaler As needed   ?Activity as tolerated.  ?High protein diet  ?Work on not smoking .  ?Set up overnight oximetry test ?Follow-up in 3 to 4 months with Dr. Mortimer Fries and As needed   ?

## 2021-11-12 NOTE — Assessment & Plan Note (Signed)
High-protein diet 

## 2021-11-12 NOTE — Assessment & Plan Note (Signed)
Continue follow-up with oncology and serial CT ?

## 2021-11-12 NOTE — Telephone Encounter (Signed)
Patient Advocate Encounter ?  ?Received notification from [facility] that prior authorization for Judithann Sauger is required by his/her insurance [employer]. ?  ?PA submitted on 11/12/21 ? ?[place] TRACKS#: [id number] W ? ?Status is pending ?   ?[facility] will continue to follow: ? ?Patient Advocate ?Fax: [phone]  ?

## 2021-11-12 NOTE — Addendum Note (Signed)
Addended by: Claudette Head A on: 11/12/2021 02:39 PM ? ? Modules accepted: Orders ? ?

## 2021-11-12 NOTE — Assessment & Plan Note (Signed)
History of chronic respiratory failure on oxygen.  Today in the office walk test shows no desaturations with O2 saturations maintaining at 99% on room air while walking.  We will set patient up for an overnight oximetry test to check for nocturnal hypoxemia ?

## 2021-11-12 NOTE — Assessment & Plan Note (Addendum)
Moderate COPD with emphysema-recommend triple therapy. ?Smoking cessation discussed. ?Declines flu and pneumonia vaccine. ? ?Plan  ?Patient Instructions  ?Continue on BREZTRI 2 puffs Twice daily  , rinse after use .  ?Albuterol inhaler As needed   ?Activity as tolerated.  ?High protein diet  ?Work on not smoking .  ?Set up overnight oximetry test ?Follow-up in 3 to 4 months with Dr. Mortimer Fries and As needed   ?  ? ?

## 2021-11-12 NOTE — Progress Notes (Signed)
? ? ?Manufacturing [facility] ?[facility] Consult Note ?Telephone: [phone]  ?Fax: [phone]  ? ? ?Date of encounter: 11/12/21 ?10:37 AM ?PATIENT NAME: David [name] ?[place] [address]   ?[phone] (home)  ?DOB: Oct 20, 1955 ?MRN: [id number] ?PRIMARY CARE PROVIDER:    ?Olin Hauser, DO,  ?[address] ?[place] [address] ?[phone] ? ?REFERRING PROVIDER:   ?Olin Hauser, DO ?[address] ?[place],  [place] [address] ?[phone] ? ?RESPONSIBLE PARTY:    ?Contact Information   ? ? Name Relation Home Work Mobile  ? As[place], [place] Daughter [phone]  [phone]  ? ?  ? ? ? ?I met face to face with patient in the home. Palliative Care was asked to follow this patient by consultation request of  Nobie Putnam * to address advance care planning and complex medical decision making. This is a follow up visit. ? ?                                 ASSESSMENT AND PLAN / RECOMMENDATIONS:  ? ?Advance Care Planning/Goals of Care: Goals include to maximize quality of life and symptom management. Patient/health care surrogate gave his/her permission to discuss. ?Our advance care planning conversation included a discussion about:    ?The value and importance of advance care planning  ?Experiences with loved ones who have been seriously ill or have died  ?Exploration of personal, cultural or spiritual beliefs that might influence medical decisions  ?Exploration of goals of care in the event of a sudden injury or illness  ?CODE STATUS: Full Code ? ?Patient is open to hospitalizations, he wants to continue Full Code status.  ? ?Symptom Management/Plan: ? ?Shortness of breath-secondary to COPD, centrilobular emphysema.  Patient endorses shortness of breath with exertion and orthopnea.  He states he needs a refill on both inhalers he states he will call them today.  Continue education on smoking cessation. ? 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 CT scan November 2022 no obvious evidence of recurrence or progressive disease. Patient noted to have an enlarging right lower lobe lesion; suspect that this was synchronous primary and not recurrence. He is to have repeat CT scan in 6 months and f/u with oncology as scheduled. ? ?Abdominal pain-patient reports sharp intermittent abdominal pain in the past week.  He states he is also having diarrhea/loose stools and taking Pepto-Bismol as needed.  Occasional nausea no vomiting, no fever or chills.  We discussed following up with PCP as soon as possible or urgent care should symptoms worsen.  ? ?Follow up Palliative Care Visit: Palliative care will continue to follow for complex medical decision making, advance care planning, and clarification of goals. Return in 6 weeks or prn. ? ? ?This visit was coded based on medical decision making (MDM). ? ?PPS: 60% ? ?HOSPICE ELIGIBILITY/DIAGNOSIS: TBD ? ?Chief Complaint: Palliative Medicine follow up visit.  ? ?HISTORY OF PRESENT ILLNESS:  David [name] is a 66 y.o. 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   ? ?Endorses sharp pain that comes and goes, mid abdomen.denies any pain to abdomen during visit today.  Occasional nausea, no vomiting. Has had diarrhea/loose stools x past week. Still endorses shortness of breath with exertion.  He states his appetite has improved.  Needs refills  on mirtazapine, inhalers, and Protonix. No recent falls. A 10 point review of systems is negative, except for the pertinent positives and negatives detailed per the HPI. ? ?History obtained from review of EMR, discussion with primary team, and interview with family, facility staff/caregiver and/or David [name].  ?I reviewed available labs, medications, imaging, studies and related documents from the EMR.  Records reviewed and summarized above.  ? ? ?Physical  Exam: ? ?Pulse 88, resp 18, 114/68, sats 98% on room air. ?Constitutional: NA, non-ill appearing ?General: frail appearing, thin ?EYES: anicteric sclera, lids intact, no discharge  ?ENMT: intact hearing, oral mucous membranes moist, dentition intact ?CV: S1S2, RRR, no LE edema ?Pulmonary: bases diminished, no increased work of breathing, NP cough, room air ?Abdomen: normo-active BS + 4 quadrants, soft and non tender ?GU: deferred ?MSK: +sarcopenia, moves all extremities, ambulatory ?Skin: warm and dry, no rashes or wounds on visible skin ?Neuro:  no generalized weakness,  no cognitive impairment ?Psych: non-anxious affect, A and O x 3 ?Hem/lymph/immuno: no widespread bruising ? ? ?Thank you for the opportunity to participate in the care of David [name].  The palliative care team will continue to follow. Please call our office at [phone] if we can be of additional assistance.  ? ?Ezekiel Slocumb, NP  ? ?COVID-19 PATIENT SCREENING TOOL ?Asked and negative response unless otherwise noted:  ? ?Have you had symptoms of covid, tested positive or been in contact with someone with symptoms/positive test in the past 5-10 days? No  ? ?

## 2021-11-12 NOTE — Progress Notes (Signed)
? ?@Patient  ID: David [name], male    DOB: 1955-11-07, 66 y.o.   MRN: [id number] ? ?Chief Complaint  ?Patient presents with  ? Follow-up  ? ? ?Referring provider: ?Nobie Putnam * ? ?HPI: ?66 year old male active smoker followed for COPD, and history of stage II small cell lung cancer, chronic respiratory failure previously on oxygen ?Medical history significant for polysubstance abuse with cocaine ? ?TEST/EVENTS :  ?CT chest June 22, 2021 shows no substantial interval change in the right upper lobe pulmonary lesion with low metabolic activity on previous PET in July 2022, stable 13 mm right lower lobe nodule, multiple scattered pulmonary nodules are stable. ? ?Spirometry 2017 FEV1 69%, ratio 62.  ? ?11/12/2021 Follow up : COPD, lung cancer ?Returns for follow up, last seen in office in 05/2019.  Patient says that his breathing is doing about the same.  He gets short of breath with minimal activities.  He has recently been started on Breztri inhaler which he feels like does help some.  He only has samples and needs a prescription sent to his pharmacy.  Patient smokes about 1 to 1-1/2 packs of cigarettes daily.  Is trying to cut back.  Patient is not interested in quitting smoking right now.  We went over the dangers of ongoing smoking.  ?Patient has a daily cough that is minimally productive.  He denies any hemoptysis.  Says that his weight remains low.  He is currently 119 pounds with a BMI of 17.  We discussed a high-protein diet. ? ?Patient has a known history of stage IIa small cell carcinoma of the right lung.  Completed chemotherapy and May 2017.  Suspected synchronous right lower lobe lung cancer with enlarging right lower lobe lesion measuring 1.3 x 1.0 cm status post SBRT June 2022.  He is followed by oncology.  He is also followed by palliative care at home.  ? ?Previously on oxygen , sent oxygen back because he was moving. Feels he needs oxygen .  Patient feels that he needs oxygen again.   Gets really short of breath with heavy activities.  Walk test in the office shows ? ? ? ?Allergies  ?Allergen Reactions  ? Tylenol [Acetaminophen] Other (See Comments)  ?  Unable to take due to chemotherapy regimen  ? Vicodin [Hydrocodone-Acetaminophen] Nausea And Vomiting and Rash  ? ? ? ?There is no immunization history on file for this patient. ? ?Past Medical History:  ?Diagnosis Date  ? Arthritis   ? Asthma   ? COPD (chronic obstructive pulmonary disease) ([place])   ? not on home o2  ? Gout   ? History of kidney stones   ? History of melanoma   ? Hypertension   ? Oxygen deficiency   ? O2 1 LITER [facility]  ? Pain   ? chronic pain all over  ? Small cell lung cancer ([place])   ? melanoma  ? Tobacco use   ? ? ?Tobacco History: ?Social History  ? ?Tobacco Use  ?Smoking Status Every Day  ? Packs/day: 2.00  ? Years: 51.00  ? Pack years: 102.00  ? Types: Cigarettes  ? Start date: 08/12/1994  ?Smokeless Tobacco Never  ?Tobacco Comments  ? Smoking 1 ppd s of 11/12/2021 hfb   ? ?Ready to quit: Not Answered ?Counseling given: Not Answered ?Tobacco comments: Smoking 1 ppd s of 11/12/2021 hfb  ? ? ?Outpatient Medications Prior to Visit  ?Medication Sig Dispense Refill  ? albuterol (PROAIR HFA) 108 (90 Base) MCG/ACT  inhaler Inhale 2 puffs into the lungs every 6 (six) hours as needed for wheezing or shortness of breath. 8.5 each 5  ? baclofen (LIORESAL) 10 MG tablet Take 0.5-1 tablets (5-10 mg total) by mouth 3 (three) times daily as needed for muscle spasms. 30 tablet 3  ? Budeson-Glycopyrrol-Formoterol (BREZTRI AEROSPHERE) 160-9-4.8 MCG/ACT AERO Inhale 2 puffs into the lungs 2 (two) times daily. 10.7 g 0  ? LORazepam (ATIVAN) 0.5 MG tablet Take 1 tablet (0.5 mg total) by mouth every 8 (eight) hours as needed for anxiety. 20 tablet 0  ? mirtazapine (REMERON) 15 MG tablet Take 1 tablet (15 mg total) by mouth at bedtime. 90 tablet 1  ? naproxen (NAPROSYN) 500 MG tablet Take 1 tablet (500 mg total) by mouth 2 (two) times daily with a meal. 20  tablet 2  ? pantoprazole (PROTONIX) 40 MG tablet Take 1 tablet (40 mg total) by mouth daily before breakfast. 90 tablet 1  ? gabapentin (NEURONTIN) 300 MG capsule Take 1 capsule (300 mg total) by mouth 3 (three) times daily. (Patient not taking: Reported on 02/04/2021) 270 capsule 1  ? ?No facility-administered medications prior to visit.  ? ? ? ?Review of Systems:  ? ?Constitutional:   No  weight loss, night sweats,  Fevers, chills, fatigue, or  lassitude. ? ?HEENT:   No headaches,  Difficulty swallowing,  Tooth/dental problems, or  Sore throat,  ?              No sneezing, itching, ear ache, nasal congestion, post nasal drip,  ? ?CV:  No chest pain,  Orthopnea, PND, swelling in lower extremities, anasarca, dizziness, palpitations, syncope.  ? ?GI  No heartburn, indigestion, abdominal pain, nausea, vomiting, diarrhea, change in bowel habits, loss of appetite, bloody stools.  ? ?Resp: No shortness of breath with exertion or at rest.  No excess mucus, no productive cough,  No non-productive cough,  No coughing up of blood.  No change in color of mucus.  No wheezing.  No chest wall deformity ? ?Skin: no rash or lesions. ? ?GU: no dysuria, change in color of urine, no urgency or frequency.  No flank pain, no hematuria  ? ?MS:  No joint pain or swelling.  No decreased range of motion.  No back pain. ? ? ? ?Physical Exam ? ?BP (!) 110/50 (BP Location: Right Arm, Patient Position: Sitting, Cuff Size: Normal)   Pulse 92   Temp 97.9 ?F (36.6 ?C) (Oral)   Ht 5\' 9"  (1.753 m)   Wt 119 lb 3.2 oz (54.1 kg)   SpO2 98%   BMI 17.60 kg/m?  ? ?GEN: A/Ox3; pleasant , NAD, thin, cachectic ?  ?HEENT:  [place]/AT,  , NOSE-clear, THROAT-clear, no lesions, no postnasal drip or exudate noted.  Edentulous ? ?NECK:  Supple w/ fair ROM; no JVD; normal carotid impulses w/o bruits; no thyromegaly or nodules palpated; no lymphadenopathy.   ? ?RESP  Clear  P & A; w/o, wheezes/ rales/ or rhonchi. no accessory muscle use, no dullness to  percussion ? ?CARD:  RRR, no m/r/g, no peripheral edema, pulses intact, no cyanosis or clubbing. ? ?GI:   Soft & nt; nml bowel sounds; no organomegaly or masses detected.  ? ?Musco: Warm bil, no deformities or joint swelling noted.  ? ?Neuro: alert, no focal deficits noted.   ? ?Skin: Warm, no lesions or rashes ? ? ? ?Lab Results: ? ?CBC ? ? ?ProBNP ?No results found for: PROBNP ? ?Imaging: ?No results found. ? ? ? ? ? ?  No results found for: NITRICOXIDE ? ? ? ? ? ?Assessment & Plan:  ? ?COPD (chronic obstructive pulmonary disease) ([place]) ?Moderate COPD with emphysema-recommend triple therapy. ?Smoking cessation discussed. ?Declines flu and pneumonia vaccine. ? ?Plan  ?Patient Instructions  ?Continue on BREZTRI 2 puffs Twice daily  , rinse after use .  ?Albuterol inhaler As needed   ?Activity as tolerated.  ?High protein diet  ?Work on not smoking .  ?Set up overnight oximetry test ?Follow-up in 3 to 4 months with Dr. Mortimer Fries and As needed   ?  ? ? ?Chronic respiratory failure with hypoxia (H[facility]) ?History of chronic respiratory failure on oxygen.  Today in the office walk test shows no desaturations with O2 saturations maintaining at 99% on room air while walking.  We will set patient up for an overnight oximetry test to check for nocturnal hypoxemia ? ?Severe protein-calorie malnutrition ([place]) ?High-protein diet ? ?Small cell lung cancer ([place]) ?Continue follow-up with oncology and serial CT ? ? ? ? ?Rexene Edison, NP ?11/12/2021 ? ?

## 2021-11-13 ENCOUNTER — Other Ambulatory Visit (HOSPITAL_COMMUNITY): Payer: Self-pay

## 2021-11-13 NOTE — Telephone Encounter (Signed)
Patient Advocate Encounter ? ?Prior Authorization for [employer] inhaler has been approved.   ? ?[place] [employer]# [id number] ? ?Effective dates: 11/12/21 through 11/12/22 ? ?Per Test Claim Patients co-pay is $4.  ? ?Spoke with Pharmacy to Process. ? ?Patient Advocate ?Fax: [phone]  ?

## 2021-12-24 ENCOUNTER — Other Ambulatory Visit: Payer: Medicaid Other | Admitting: Student

## 2021-12-24 DIAGNOSIS — R0602 Shortness of breath: Secondary | ICD-10-CM

## 2021-12-24 DIAGNOSIS — C3490 Malignant neoplasm of unspecified part of unspecified bronchus or lung: Secondary | ICD-10-CM

## 2021-12-24 DIAGNOSIS — Z515 Encounter for palliative care: Secondary | ICD-10-CM

## 2021-12-24 NOTE — Progress Notes (Signed)
[facility] Consult Note Telephone: [phone]  Fax: [phone]    Date of encounter: 12/24/21 10:31 AM PATIENT NAME: David [name] [place] [address]   [phone] (home)  DOB: [birth year] MRN: [id number] PRIMARY CARE PROVIDER:    Olin Hauser, DO,  [place] [address] [phone]  REFERRING PROVIDER:   Olin Hauser, DO [address] [place],  [place] [address] [phone]  RESPONSIBLE PARTY:    Contact Information     Name Relation Home Work Mobile   [place] Daughter [phone]  [phone]        I met face to face with patient in the home. Palliative Care was asked to follow this patient by consultation request of  Nobie Putnam * to address advance care planning and complex medical decision making. This is a follow up visit.                                   ASSESSMENT AND PLAN / RECOMMENDATIONS:   Advance Care Planning/Goals of Care: Goals include to maximize quality of life and symptom management. Patient/health care surrogate gave his/her permission to discuss. Our advance care planning conversation included a discussion about:    The value and importance of advance care planning  Experiences with loved ones who have been seriously ill or have died  Exploration of personal, cultural or spiritual beliefs that might influence medical decisions  Exploration of goals of care in the event of a sudden injury or illness  CODE STATUS: Full Code   Symptom Management/Plan:  Shortness of breath-secondary to COPD, centrilobular emphysema.  Patient endorses shortness of breath with exertion and orthopnea. He states he sent back device for nocturnal test for oxygen; awaiting response. Patient states he ran out of sample refills last week. He states he is not able to pay co-pays for inhalers or any other medications. Referral made to palliative SW.    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 CT scan November 2022 no obvious evidence of recurrence or progressive disease. Patient with f/u CT scheduled 01/2022; he is to follow up with oncology as scheduled.  Follow up Palliative Care Visit: Palliative care will continue to follow for complex medical decision making, advance care planning, and clarification of goals. Return in 8 weeks or prn.  This visit was coded based on medical decision making (MDM).  PPS: 60%  HOSPICE ELIGIBILITY/DIAGNOSIS: TBD  Chief Complaint: Palliative Medicine follow up visit.   HISTORY OF PRESENT ILLNESS:  POSEIDON PAM is a 66 y.o. 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  Patient states he was given samples for his inhalers, but ran out last week. He states he is not currently taking any medications as he cannot afford co-pays. He endorses shortness of breath with exertion. His cough is mostly non-productive, occasional white phlegm. He was did nocturnal testing for in home oxygen; awaiting results. He endorses fair appetite. No recent falls or injury. A 10-point ROS is negative, except for the pertinent positives and negatives detailed per the HPI.  History obtained from review of EMR, discussion with primary team, and interview with family, facility staff/caregiver and/or David [name].  I reviewed available labs, medications, imaging, studies and related documents from the EMR.  Records reviewed and summarized above.   Physical Exam: pulse 92, resp b/p 120/62, sats 96% on room air Constitutional: NAD General: frail appearing, thin EYES: anicteric sclera, lids intact, no discharge  ENMT: intact hearing, oral mucous membranes moist, dentition intact CV: S1S2, RRR, no LE edema Pulmonary: upper lobes clear, bases diminished, no increased work of breathing, + cough, room  air Abdomen: normo-active BS + 4 quadrants, soft and non tender, no ascites GU: deferred MSK: sarcopenia, moves all extremities, ambulatory Skin: warm and dry, no rashes or wounds on visible skin Neuro:  no generalized weakness Psych: non-anxious affect, A and O x 3 Hem/lymph/immuno: no widespread bruising   Thank you for the opportunity to participate in the care of David [name].  The palliative care team will continue to follow. Please call our office at [phone] if we can be of additional assistance.   Ezekiel Slocumb, NP   COVID-19 PATIENT SCREENING TOOL Asked and negative response unless otherwise noted:   Have you had symptoms of covid, tested positive or been in contact with someone with symptoms/positive test in the past 5-10 days? No

## 2021-12-28 ENCOUNTER — Telehealth: Payer: Self-pay

## 2021-12-28 ENCOUNTER — Other Ambulatory Visit: Payer: Medicaid Other

## 2021-12-28 DIAGNOSIS — Z789 Other specified health status: Secondary | ICD-10-CM

## 2021-12-28 NOTE — Progress Notes (Signed)
TC to patient to follow up on medication assistance need per [facility] NP - L. Rivers request. Call and assessment completed  Medications: patient shared that he needed assistance with all of his medications due  having copays that he can not afford. SW advised patient that SW mailed medication assistance programs and resources to patient to address on file 10-07-21. SW inquired about patients medical insurance. Patient has Medicaid since 2015, SW advised patient to call 1-800 MEDICARE about enrolling into his Medicare benefits. As this will also assist with medication coverage.  Patient shared that he has lost the key to his mailbox and has not been able to check his mail. Patient shared that the property manager is charging $10 to replace the key, patient shared that he does not have the money at this time but is working on it. Patient stated that he will try to wait the mailman to run and will obtain all of his mail then.  Financial need: patient shared that his lights were turned off today. Light bill is $900 and he does not have the money to pay the bill. Patient shared that he outreached social services and was told they do not have funds to assist. Patient stated that he does not have family or friends that can help, but shared that he was currently riding with his friend, Jackelyn Poling. SW inquired if patient had anyone to stay while his power was out, patient shared that he might but would rather be at home.  SW placed TC to the following agencies on patient behalf to inquire about financial assistance:  [employer] [phone] - SW LVM requesting a return call be made to patient directly. [facility] [phone]. SW instructed to call back on Friday to schedule an in person appt for patient with their case manager. [employer] [phone] - SW LVM.requesting a return call be made to patient directly.  Patient is aware that these calls were placed and apprectiative of assistance.

## 2021-12-28 NOTE — Telephone Encounter (Signed)
PC SW received a TC from Chad with [employer], whom shared that they may be able to assist patient with a portion of his light bill and requested for patient to call her back directly at [phone] ext [phone].  SW made patient aware of this and stated that he would give her a call.   SW also advised that patient outreach the post office to make them aware that he can not get into his mailbox. Patient stated that he would do this as well.

## 2022-01-11 NOTE — Progress Notes (Deleted)
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  INTERVAL HISTORY: Patient returns to clinic today for further evaluation and discussion of his imaging results.  He continues to feel well and remains asymptomatic.  He has no neurologic complaints.  He denies any recent fevers or illnesses.  He has a good appetite and denies weight loss.  He denies any chest pain, shortness of breath, cough, or hemoptysis.  He denies any nausea, vomiting, constipation, or diarrhea. He has no urinary complaints.  Patient offers no specific complaints today.  REVIEW OF SYSTEMS:   Review of Systems  Constitutional: Negative.  Negative for fever, malaise/fatigue and weight loss.  HENT:  Negative for tinnitus.   Respiratory: Negative.  Negative for cough, hemoptysis and shortness of breath.   Cardiovascular: Negative.  Negative for chest pain and leg swelling.  Gastrointestinal: Negative.  Negative for abdominal pain.  Genitourinary: Negative.  Negative for dysuria.  Musculoskeletal: Negative.  Negative for joint pain and myalgias.  Skin: Negative.  Negative for rash.  Neurological: Negative.  Negative for sensory change, focal weakness, weakness and headaches.  Psychiatric/Behavioral: Negative.  The patient is not nervous/anxious.    As per HPI. Otherwise, a complete review of systems is negative.  PAST MEDICAL HISTORY: Past Medical History:  Diagnosis Date   Arthritis    Asthma    COPD (chronic obstructive pulmonary disease) ([place])    not on home o2   Gout    History of kidney stones    History of melanoma    Hypertension    Oxygen deficiency    O2 1 LITER [facility]   Pain    chronic pain all over   Small cell lung cancer ([place])    melanoma   Tobacco use      PAST SURGICAL HISTORY: Past Surgical History:  Procedure Laterality Date   ELECTROMAGNETIC NAVIGATION BROCHOSCOPY N/A 07/22/2016   Procedure: ELECTROMAGNETIC NAVIGATION BRONCHOSCOPY;  Surgeon: Flora Lipps, MD;  Location: [facility] ORS;  Service: Cardiopulmonary;  Laterality: N/A;   PERIPHERAL VASCULAR CATHETERIZATION N/A 09/15/2015   Procedure: Glori Luis Cath Insertion;  Surgeon: Algernon Huxley, MD;  Location: [facility];  Service: Cardiovascular;  Laterality: N/A;   PORTA CATH REMOVAL N/A 05/05/2017   Procedure: Glori Luis Cath Removal;  Surgeon: Algernon Huxley, MD;  Location: [facility];  Service: Cardiovascular;  Laterality: N/A;   SKIN GRAFT     [facility]    FAMILY HISTORY: Reviewed and unchanged. No reported history of malignancy or chronic disease.     ADVANCED DIRECTIVES:    HEALTH MAINTENANCE: Social History   Tobacco Use   Smoking status: Every Day    Packs/day: 2.00    Years: 51.00    Pack years: 102.00    Types: Cigarettes    Start date: 08/12/1994   Smokeless tobacco: Never   Tobacco comments:    Smoking 1 ppd s of 11/12/2021 hfb   Vaping Use   Vaping Use: Never used  Substance Use Topics   Alcohol use: No    Alcohol/week: 0.0 standard drinks   Drug use: No    Comment: approx. a month quit     Colonoscopy:  PAP:  Bone density:  Lipid panel:  Allergies  Allergen Reactions   Tylenol [Acetaminophen]  Other (See Comments)    Unable to take due to chemotherapy regimen   Vicodin [Hydrocodone-Acetaminophen] Nausea And Vomiting and Rash    Current Outpatient Medications  Medication Sig Dispense Refill   albuterol (PROAIR HFA) 108 (90 Base) MCG/ACT inhaler Inhale 2 puffs into the lungs every 6 (six) hours as needed for wheezing or shortness of breath. 8.5 each 5   baclofen (LIORESAL) 10 MG tablet Take 0.5-1 tablets (5-10 mg total) by mouth 3 (three) times daily as needed for muscle spasms. 30 tablet 3   Budeson-Glycopyrrol-Formoterol (BREZTRI AEROSPHERE)  160-9-4.8 MCG/ACT AERO Inhale 2 puffs into the lungs 2 (two) times daily. 10.7 g 11   LORazepam (ATIVAN) 0.5 MG tablet Take 1 tablet (0.5 mg total) by mouth every 8 (eight) hours as needed for anxiety. 20 tablet 0   mirtazapine (REMERON) 15 MG tablet Take 1 tablet (15 mg total) by mouth at bedtime. 90 tablet 1   naproxen (NAPROSYN) 500 MG tablet Take 1 tablet (500 mg total) by mouth 2 (two) times daily with a meal. 20 tablet 2   pantoprazole (PROTONIX) 40 MG tablet Take 1 tablet (40 mg total) by mouth daily before breakfast. 90 tablet 1   No current facility-administered medications for this visit.    OBJECTIVE: There were no vitals filed for this visit.     There is no height or weight on file to calculate BMI.    ECOG FS:0 - Asymptomatic  General: Well-developed, well-nourished, no acute distress. Eyes: Pink conjunctiva, anicteric sclera. HEENT: Normocephalic, moist mucous membranes. Lungs: No audible wheezing or coughing. Heart: Regular rate and rhythm. Abdomen: Soft, nontender, no obvious distention. Musculoskeletal: No edema, cyanosis, or clubbing. Neuro: Alert, answering all questions appropriately. Cranial nerves grossly intact. Skin: No rashes or petechiae noted. Psych: Normal affect.  LAB RESULTS:  Lab Results  Component Value Date   NA 139 02/16/2019   K 4.0 02/16/2019   CL 105 02/16/2019   CO2 25 02/16/2019   GLUCOSE 123 (H) 02/16/2019   BUN 13 02/16/2019   CREATININE 0.90 06/22/2021   CALCIUM 8.8 (L) 02/16/2019   PROT 6.9 02/16/2019   ALBUMIN 3.8 02/16/2019   AST 15 02/16/2019   ALT 9 02/16/2019   ALKPHOS 59 02/16/2019   BILITOT 0.4 02/16/2019   GFRNONAA >60 02/16/2019   GFRAA >60 02/16/2019    Lab Results  Component Value Date   WBC 6.8 02/16/2019   NEUTROABS 4.3 02/16/2019   HGB 12.6 (L) 02/16/2019   HCT 38.2 (L) 02/16/2019   MCV 91.2 02/16/2019   PLT 229 02/16/2019     STUDIES: No results found.   ASSESSMENT: Stage IIa small cell carcinoma  of the right lung.  PLAN:    1. 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  CT scan results from June 22, 2021 reviewed independently and report as above with no obvious evidence of recurrent or progressive disease.  Continue active surveillance.  Repeat CT scan in 6 months with follow-up 1 to 2 days later. 2.  Right lower lobe malignancy: CT scan on January 21, 2021 revealed an enlarging right lower lobe lesion measuring 1.3 x 1.0 cm.  Suspect this was a synchronous primary and not recurrence.  Patient has now completed SBRT to this lesion.  Repeat CT scan as above.  Follow-up in 6 months. 3.  Pain: Resolved.  Patient does not complain of pain today.  Given patient's history of substance abuse, he will not  be given narcotics from this clinic.   4. History of melanoma: Patient is a poor historian but appears to have been greater than 10 years ago. Monitor.  I spent a total of 20 minutes reviewing chart data, face-to-face evaluation with the patient, counseling and coordination of care as detailed above.   Patient expressed understanding and was in agreement with this plan. He also understands that He can call clinic at any time with any questions, concerns, or complaints.   Lloyd Huger, MD   01/11/2022 10:26 AM

## 2022-01-12 ENCOUNTER — Ambulatory Visit: Admission: RE | Admit: 2022-01-12 | Payer: Medicaid Other | Source: Ambulatory Visit

## 2022-01-13 ENCOUNTER — Ambulatory Visit: Payer: Medicaid Other | Attending: Radiation Oncology | Admitting: Radiation Oncology

## 2022-01-13 ENCOUNTER — Inpatient Hospital Stay: Payer: Medicaid Other | Attending: Oncology | Admitting: Oncology

## 2022-01-13 DIAGNOSIS — C3490 Malignant neoplasm of unspecified part of unspecified bronchus or lung: Secondary | ICD-10-CM

## 2022-01-14 ENCOUNTER — Ambulatory Visit: Payer: Medicaid Other | Admitting: Radiation Oncology

## 2022-01-14 ENCOUNTER — Encounter: Payer: Self-pay | Admitting: Oncology

## 2022-01-14 ENCOUNTER — Ambulatory Visit: Payer: Medicaid Other | Admitting: Oncology

## 2022-02-02 ENCOUNTER — Ambulatory Visit: Payer: Self-pay

## 2022-02-10 ENCOUNTER — Ambulatory Visit: Payer: Medicaid Other | Admitting: Family Medicine

## 2022-02-23 ENCOUNTER — Other Ambulatory Visit: Payer: Medicaid Other | Admitting: Student

## 2022-02-23 DIAGNOSIS — S065XAA Traumatic subdural hemorrhage with loss of consciousness status unknown, initial encounter: Secondary | ICD-10-CM

## 2022-02-23 DIAGNOSIS — J449 Chronic obstructive pulmonary disease, unspecified: Secondary | ICD-10-CM

## 2022-02-23 DIAGNOSIS — G40909 Epilepsy, unspecified, not intractable, without status epilepticus: Secondary | ICD-10-CM

## 2022-02-23 DIAGNOSIS — I4891 Unspecified atrial fibrillation: Secondary | ICD-10-CM

## 2022-02-23 DIAGNOSIS — E43 Unspecified severe protein-calorie malnutrition: Secondary | ICD-10-CM

## 2022-02-23 DIAGNOSIS — Z515 Encounter for palliative care: Secondary | ICD-10-CM

## 2022-02-23 DIAGNOSIS — R059 Cough, unspecified: Secondary | ICD-10-CM

## 2022-02-23 NOTE — Progress Notes (Signed)
[facility] Consult Note Telephone: [phone]  Fax: [phone]    Date of encounter: 02/23/22  PATIENT NAME: [name] [id number]   [phone] (home)  DOB: [birth year] MRN: [id number] PRIMARY CARE PROVIDER:    Olin Hauser, DO,  [place] [address] [phone]  REFERRING PROVIDER:   Olin Hauser, DO [address] [place],  [name] [address] [phone]  RESPONSIBLE PARTY:    Contact Information     Name Relation Home Work Mobile   David [name] Daughter [phone]  [phone]        I met face to face with patient and family in the home. Palliative Care was asked to follow this patient by consultation request of  David [name] * to address advance care planning and complex medical decision making. This is a follow up visit.                                   ASSESSMENT AND PLAN / RECOMMENDATIONS:   Advance Care Planning/Goals of Care: Goals include to maximize quality of life and symptom management. Patient/health care surrogate gave his/her permission to discuss. Our advance care planning conversation included a discussion about:    The value and importance of advance care planning  Experiences with loved ones who have been seriously ill or have died  Exploration of personal, cultural or spiritual beliefs that might influence medical decisions  Exploration of goals of care in the event of a sudden injury or illness  Identification of a healthcare agent; unsure if his daughter David [name] has [place]: Full Code; would like CPR attempted once.  Education provided on Palliative Medicine. We discussed goals of care. He expresses not wanting to keep going to "doctors offices" but is open to hospitalization as needed. We discussed providers that come into home. Encourage patient to follow up with appointments as scheduled. Patient does  not express clear understanding of his medical conditions. He also express Medicaid not paying for his medications. He is encouraged to notify his DSS caseworker. Palliative SW has reached out to patient and sent information regarding medication assistance; friend David [name] is present during visit and made aware of this as well.   Symptom Management/Plan:  Subdural hematoma-CT on 7/10 shows no acute intracranial abnormalities, decreased left hemispheric chronic subdural hematoma near complete resolution of midline shift.   Seizures-continue Keppra 750 mg BID as directed. Follow up with neurology as scheduled.   COPD-endorses shortness of breath with exertion. Cough with difficulty clearing phlegm. Will order guaifenesin; discussed Mucinex but he prefers liquid. Patient has albuterol and Spiriva inhalers in the home. He does not take regularly; encourage patient to take medications as prescribed. He does not have Symbicort on hand.   Protein calorie malnutrition-patient endorses fair appetite; encourage foods/snacks he enjoys.   Atrial fibrillation with RVR-continue metoprolol 25 mg BID as directed.   Medication adherence-patient is taking Keppra and metoprolol; education provided on both of these medications and importance of taking these as well as other prescribed medications (inhalers). He is needing refill on his Protonix.   Follow up Palliative Care Visit: Palliative care will continue to follow for complex medical decision making, advance care planning, and clarification of goals. Return in 8 weeks or prn.  This visit was coded based on medical decision making (MDM).  PPS: 50%  HOSPICE  ELIGIBILITY/DIAGNOSIS: TBD  Chief Complaint: Palliative Medicine follow up visit.   HISTORY OF PRESENT ILLNESS:  David [name] is a 66 y.o. year old male  with small cell carcinoma of the right lung, COPD, centrilobular emphysema, protein calorie malnutrition, hx of melanoma. 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  Patient resides at home; friend David [name] is present today. Patient has had one fall since hospitalization. Unsure of any seizure activity, although David [name] states she thinks he has had seizure since returning home. He endorses fair appetite. He does endorse itching, which is not a new complaint for patient. Endorses shortness of breath with exertion; not using inhalers routinely. Patient states his medications have not been covered by his Medicaid.   History obtained from review of EMR, discussion with primary team, and interview with family, facility staff/caregiver and/or David [name].  I reviewed available labs, medications, imaging, studies and related documents from the EMR.  Records reviewed and summarized above.   ROS  A 10-point ROS is negative, except for pertinent positives detailed per HPI.   Physical Exam: Pulse 76, resp 20, b/p 100/64, sats 97% on room air Constitutional: NAD General: frail appearing, thin EYES: anicteric sclera, lids intact, no discharge  ENMT: intact hearing, oral mucous membranes moist, dentition intact CV: S1S2, RRR, no LE edema Pulmonary: Lungs diminished bilaterally,  no increased work of breathing, non-productive cough, room air Abdomen: intake 100%, normo-active BS + 4 quadrants, soft and non tender, no ascites GU: deferred MSK: + sarcopenia, moves all extremities, ambulatory Skin: warm and dry, scabs to bilateral arms, scalp Neuro:  + generalized weakness,  A & O x 3 Psych: non-anxious affect Hem/lymph/immuno: no widespread bruising   Thank you for the opportunity to participate in the care of David [name].  The palliative care team will continue to follow. Please call our office at [phone] if we can be of additional assistance.   David Slocumb, NP   COVID-19 PATIENT SCREENING TOOL Asked and negative response unless otherwise noted:   Have you  had symptoms of covid, tested positive or been in contact with someone with symptoms/positive test in the past 5-10 days? No

## 2022-03-08 ENCOUNTER — Other Ambulatory Visit: Payer: Self-pay | Admitting: Family Medicine

## 2022-03-08 DIAGNOSIS — K219 Gastro-esophageal reflux disease without esophagitis: Secondary | ICD-10-CM

## 2022-03-08 DIAGNOSIS — R630 Anorexia: Secondary | ICD-10-CM

## 2022-03-09 NOTE — Telephone Encounter (Signed)
Requested medications are due for refill today.  yes  Requested medications are on the active medications list.  yes  Last refill. 06/10/2021 #90 1 refill  Future visit scheduled.   no  Notes to clinic.  Pharmacy needs Dx code.    Requested Prescriptions  Pending Prescriptions Disp Refills   pantoprazole (PROTONIX) 40 MG tablet [Pharmacy Med Name: PANTOPRAZOLE SOD DR 40 MG TAB] 90 tablet 1    Sig: TAKE 1 TABLET BY MOUTH DAILY BEFORE BREAKFAST     Gastroenterology: Proton Pump Inhibitors Passed - 03/08/2022  3:15 PM      Passed - Valid encounter within last 12 months    Recent Outpatient Visits           5 months ago Centrilobular emphysema ([place])   [place], DO   1 year ago Bilateral impacted cerumen   [place], NP   1 year ago Centrilobular emphysema [facility])   [place], DO   4 years ago Breast pain, left   [place], DO   4 years ago Chronic obstructive pulmonary disease, unspecified COPD type [facility])   [facility], Devonne Doughty, DO

## 2022-03-17 ENCOUNTER — Other Ambulatory Visit: Payer: Self-pay | Admitting: Family Medicine

## 2022-03-17 DIAGNOSIS — Z720 Tobacco use: Secondary | ICD-10-CM

## 2022-03-17 DIAGNOSIS — J432 Centrilobular emphysema: Secondary | ICD-10-CM

## 2022-03-17 DIAGNOSIS — R630 Anorexia: Secondary | ICD-10-CM

## 2022-03-17 DIAGNOSIS — J449 Chronic obstructive pulmonary disease, unspecified: Secondary | ICD-10-CM

## 2022-03-17 DIAGNOSIS — K219 Gastro-esophageal reflux disease without esophagitis: Secondary | ICD-10-CM

## 2022-03-17 MED ORDER — NICOTINE 21 MG/24HR TD PT24
21.0000 mg | MEDICATED_PATCH | TRANSDERMAL | 1 refills | Status: DC
Start: 2022-03-17 — End: 2022-03-19

## 2022-03-17 NOTE — Telephone Encounter (Signed)
Call to pharmacy- Pantoprazole is ready for patient pick up Requested Prescriptions  Pending Prescriptions Disp Refills  . nicotine (NICODERM CQ - DOSED IN MG/24 HOURS) 21 mg/24hr patch 30 patch 1    Sig: Place 1 patch (21 mg total) onto the skin daily.     Psychiatry:  Drug Dependence Therapy Passed - 03/17/2022  2:30 PM      Passed - Valid encounter within last 12 months    Recent Outpatient Visits          6 months ago Centrilobular emphysema ([place])   [place], DO   1 year ago Bilateral impacted cerumen   [place], NP   1 year ago Centrilobular emphysema [facility])   [place], DO   4 years ago Breast pain, left   [facility] [place], Devonne Doughty, DO   4 years ago Chronic obstructive pulmonary disease, unspecified COPD type [facility])   [facility] Olin Hauser, DO      Future Appointments            In 2 days [place], Devonne Doughty, DO [facility], [place]           . pantoprazole (PROTONIX) 40 MG tablet 90 tablet 1    Sig: Take 1 tablet (40 mg total) by mouth daily before breakfast.     Gastroenterology: Proton Pump Inhibitors Passed - 03/17/2022  2:30 PM      Passed - Valid encounter within last 12 months    Recent Outpatient Visits          6 months ago Centrilobular emphysema ([place])   [facility] Olin Hauser, DO   1 year ago Bilateral impacted cerumen   [facility] Kathrine Haddock, NP   1 year ago Centrilobular emphysema [facility])   [facility], DO   4 years ago Breast pain, left   [facility] [place], Devonne Doughty, DO   4 years ago Chronic obstructive pulmonary disease, unspecified COPD type [facility])   [place], DO      Future Appointments            In  2 days [place], Devonne Doughty, DO [facility], [place] 160-4.5 MCG/ACT inhaler 1 each 12    Sig: Inhale 2 puffs into the lungs in the morning and at bedtime.     Pulmonology:  Combination Products Passed - 03/17/2022  2:30 PM      Passed - Valid encounter within last 12 months    Recent Outpatient Visits          6 months ago Centrilobular emphysema ([place])   [place], DO   1 year ago Bilateral impacted cerumen   [place], NP   1 year ago Centrilobular emphysema [facility])   [place], DO   4 years ago Breast pain, left   [facility] [place], Devonne Doughty, DO   4 years ago Chronic obstructive pulmonary disease, unspecified COPD type [facility])   [place], DO      Future Appointments            In 2 days  Olin Hauser, DO [facility], [facility]

## 2022-03-17 NOTE — Telephone Encounter (Signed)
Medication Refill - Medication:   LEVETIRACETAM 700 MG METOPROLOL TARTRATE 20 MG SYMBICORT INHALER NICOTINE PATCHES pantoprazole (PROTONIX) 40 MG tablet (caller states pharmacy declined receiving rx)   Has the patient contacted their pharmacy? Yes.    (Agent: If yes, when and what did the pharmacy advise?) Contact PCP office  Preferred Pharmacy (with phone number or street name):    [id number] - [place], [place] [address] Phone:  [phone]  Fax:  [phone]      Has the patient been seen for an appointment in the last year OR does the patient have an upcoming appointment? Yes.    Agent: Please be advised that RX refills may take up to 3 business days. We ask that you follow-up with your pharmacy.   Patient was discharged from [facility] on 01/22/2022 and caller requesting the following medication

## 2022-03-17 NOTE — Telephone Encounter (Signed)
Requested medication (s) are due for refill today - no  Requested medication (s) are on the active medication list -no  Future visit scheduled -yes  Last refill: Rx expired and Rx discontinued   Notes to clinic: Request sent for review by provider- medications are not current. See additional notes- other medications requested- not on current medication list of in history:LEVETIRACETAM 700 MG METOPROLOL TARTRATE 20 MG ( may be from outside source and not reviewed by provider yet)  Requested Prescriptions  Pending Prescriptions Disp Refills   nicotine (NICODERM CQ - DOSED IN MG/24 HOURS) 21 mg/24hr patch 30 patch 1    Sig: Place 1 patch (21 mg total) onto the skin daily.     Psychiatry:  Drug Dependence Therapy Passed - 03/17/2022  2:30 PM      Passed - Valid encounter within last 12 months    Recent Outpatient Visits           6 months ago Centrilobular emphysema ([place])   [facility] Olin Hauser, DO   1 year ago Bilateral impacted cerumen   [facility] Kathrine Haddock, NP   1 year ago Centrilobular emphysema [facility])   [facility] Olin Hauser, DO   4 years ago Breast pain, left   [facility] [place], Devonne Doughty, DO   4 years ago Chronic obstructive pulmonary disease, unspecified COPD type [facility])   [place], DO       Future Appointments             In 2 days [place], Devonne Doughty, DO [facility], [facility]             SYMBICORT 160-4.5 MCG/ACT inhaler 1 each 12    Sig: Inhale 2 puffs into the lungs in the morning and at bedtime.     Pulmonology:  Combination Products Passed - 03/17/2022  2:30 PM      Passed - Valid encounter within last 12 months    Recent Outpatient Visits           6 months ago Centrilobular emphysema ([place])   [facility] Olin Hauser, DO   1 year ago Bilateral impacted cerumen    [facility] Kathrine Haddock, NP   1 year ago Centrilobular emphysema [facility])   [facility] Olin Hauser, DO   4 years ago Breast pain, left   [facility] [place], Devonne Doughty, DO   4 years ago Chronic obstructive pulmonary disease, unspecified COPD type [facility])   [place], DO       Future Appointments             In 2 days [place], Devonne Doughty, DO [facility], [facility]            Refused Prescriptions Disp Refills   pantoprazole (PROTONIX) 40 MG tablet 90 tablet 1    Sig: Take 1 tablet (40 mg total) by mouth daily before breakfast.     Gastroenterology: Proton Pump Inhibitors Passed - 03/17/2022  2:30 PM      Passed - Valid encounter within last 12 months    Recent Outpatient Visits           6 months ago Centrilobular emphysema [facility])   [place], DO   1 year ago Bilateral impacted cerumen   [place]  [facility] Kathrine Haddock, NP   1 year ago Centrilobular emphysema [facility]   [facility] Olin Hauser, DO   4 years ago Breast pain, left   [facility] [place], Devonne Doughty, DO   4 years ago Chronic obstructive pulmonary disease, unspecified COPD type [facility])   [place], DO       Future Appointments             In 2 days [place], Devonne Doughty, DO [facility], [facility]               Requested Prescriptions  Pending Prescriptions Disp Refills   nicotine (NICODERM CQ - DOSED IN MG/24 HOURS) 21 mg/24hr patch 30 patch 1    Sig: Place 1 patch (21 mg total) onto the skin daily.     Psychiatry:  Drug Dependence Therapy Passed - 03/17/2022  2:30 PM      Passed - Valid encounter within last 12 months    Recent Outpatient Visits           6 months ago Centrilobular emphysema ([place])   [facility] Olin Hauser, DO   1 year ago Bilateral impacted cerumen   [facility] Kathrine Haddock, NP   1 year ago Centrilobular emphysema [facility])   [facility] Olin Hauser, DO   4 years ago Breast pain, left   [facility] [place], Devonne Doughty, DO   4 years ago Chronic obstructive pulmonary disease, unspecified COPD type [facility])   [place], DO       Future Appointments             In 2 days [place], Devonne Doughty, DO [facility], [facility]             SYMBICORT 160-4.5 MCG/ACT inhaler 1 each 12    Sig: Inhale 2 puffs into the lungs in the morning and at bedtime.     Pulmonology:  Combination Products Passed - 03/17/2022  2:30 PM      Passed - Valid encounter within last 12 months    Recent Outpatient Visits           6 months ago Centrilobular emphysema ([place])   [facility] Olin Hauser, DO   1 year ago Bilateral impacted cerumen   [facility] Kathrine Haddock, NP   1 year ago Centrilobular emphysema [facility])   [facility] Olin Hauser, DO   4 years ago Breast pain, left   [facility] [place], Devonne Doughty, DO   4 years ago Chronic obstructive pulmonary disease, unspecified COPD type [facility]   [place], DO       Future Appointments             In 2 days [place], Devonne Doughty, DO [facility], [facility]            Refused Prescriptions Disp Refills   pantoprazole (PROTONIX) 40 MG tablet 90 tablet 1    Sig: Take 1 tablet (40 mg total) by mouth daily before breakfast.     Gastroenterology: Proton Pump Inhibitors Passed - 03/17/2022  2:30 PM      Passed - Valid encounter within last 12 months    Recent Outpatient Visits  6 months ago Centrilobular emphysema [facility])   [place], DO   1 year ago Bilateral impacted cerumen   [place], NP   1 year ago Centrilobular emphysema [facility])   [place], DO   4 years ago Breast pain, left   [place], DO   4 years ago Chronic obstructive pulmonary disease, unspecified COPD type [facility])   [facility] [place], Devonne Doughty, DO       Future Appointments             In 2 days [place], Devonne Doughty, DO [place], [facility]

## 2022-03-17 NOTE — Telephone Encounter (Signed)
Patient is requesting medications from outside source that need to be reviewed by provider. Medication from patient's history have been requested and sent to [facility] RF pool.  Medication not included that need to be reviewed by provider : LEVETIRACETAM 700 MG METOPROLOL TARTRATE 20 MG Copy of this request as been forwarded to office for provider review.

## 2022-03-19 ENCOUNTER — Ambulatory Visit (INDEPENDENT_AMBULATORY_CARE_PROVIDER_SITE_OTHER): Payer: Medicaid Other | Admitting: Family Medicine

## 2022-03-19 VITALS — BP 110/73 | HR 98 | Ht 69.0 in | Wt 111.6 lb

## 2022-03-19 DIAGNOSIS — C3490 Malignant neoplasm of unspecified part of unspecified bronchus or lung: Secondary | ICD-10-CM | POA: Diagnosis not present

## 2022-03-19 DIAGNOSIS — G40909 Epilepsy, unspecified, not intractable, without status epilepticus: Secondary | ICD-10-CM

## 2022-03-19 DIAGNOSIS — Z720 Tobacco use: Secondary | ICD-10-CM

## 2022-03-19 DIAGNOSIS — E43 Unspecified severe protein-calorie malnutrition: Secondary | ICD-10-CM | POA: Diagnosis not present

## 2022-03-19 DIAGNOSIS — J449 Chronic obstructive pulmonary disease, unspecified: Secondary | ICD-10-CM

## 2022-03-19 DIAGNOSIS — J432 Centrilobular emphysema: Secondary | ICD-10-CM | POA: Diagnosis not present

## 2022-03-19 DIAGNOSIS — I10 Essential (primary) hypertension: Secondary | ICD-10-CM

## 2022-03-19 MED ORDER — LEVETIRACETAM 750 MG PO TABS: 750.0000 mg | ORAL_TABLET | Freq: Two times a day (BID) | ORAL | 2 refills | Status: DC

## 2022-03-19 MED ORDER — NICOTINE 21 MG/24HR TD PT24: 21.0000 mg | MEDICATED_PATCH | TRANSDERMAL | 1 refills | Status: DC

## 2022-03-19 MED ORDER — METOPROLOL TARTRATE 25 MG PO TABS: 25.0000 mg | ORAL_TABLET | Freq: Two times a day (BID) | ORAL | 3 refills | Status: DC

## 2022-03-19 NOTE — Patient Instructions (Addendum)
Thank you for coming to the office today.  [facility] - Neurology Dept [place], [place] [address] Phone: [phone]  We sent referral for Seizures to Neurology at [facility].  Refilled seizure medicine - levETIRAcetam (KEPPRA)  Also refilled metoprolol for heart / BP  Nicotine patches re ordered.  Please schedule a Follow-up Appointment to: Return if symptoms worsen or fail to improve.  If you have any other questions or concerns, please feel free to call the office or send a message through [place]. You may also schedule an earlier appointment if necessary.  Additionally, you may be receiving a survey about your experience at our office within a few days to 1 week by e-mail or mail. We value your feedback.  Nobie Putnam, DO [place]

## 2022-03-19 NOTE — Progress Notes (Addendum)
Subjective:    Patient ID: David [name], male    DOB: [birth year], 66 y.o.   MRN: [id number]  David [name] is a 66 y.o. male presenting on 03/19/2022 for Hospitalization Follow-up, COPD, and Seizures   HPI  HOSPITAL FOLLOW-UP VISIT  Hospital/Location: [facility] Date of Admission: 12/31/21 Date of Discharge: 01/22/22 Transitions of care telephone call: Not completed  Reason for Admission: Altered Mental Status, Seizures  - Hospital H&P and Discharge Summary have been reviewed - Patient presents today 2+ months after recent hospitalization. Brief summary of recent course, patient had symptoms of altered mental status with chronic subdural hematoma. He was found to have seizures. Imaging with CT and MRI completed, Neurosurgery surveillance. He was discharged and seen by Neurosurgery on 02/15/22, they followed CT showed reduction or decreased subdural hematoma bleeding, near complete resolution.  - Today reports overall has done well but still has had breakthrough seizures off medication Keppra needs re order, was never scheduled w/ Neurology at [facility], they were trying to schedule, but could not travel to [facility].  I have reviewed the discharge medication list, and have reconciled the current and discharge medications today.   Current Outpatient Medications:    albuterol (PROAIR HFA) 108 (90 Base) MCG/ACT inhaler, Inhale 2 puffs into the lungs every 6 (six) hours as needed for wheezing or shortness of breath., Disp: 8.5 each, Rfl: 5   baclofen (LIORESAL) 10 MG tablet, Take 0.5-1 tablets (5-10 mg total) by mouth 3 (three) times daily as needed for muscle spasms., Disp: 30 tablet, Rfl: 3   Budeson-Glycopyrrol-Formoterol (BREZTRI AEROSPHERE) 160-9-4.8 MCG/ACT AERO, Inhale 2 puffs into the lungs 2 (two) times daily., Disp: 10.7 g, Rfl: 11   LORazepam (ATIVAN) 0.5 MG tablet, Take 1 tablet (0.5 mg total) by mouth every 8 (eight) hours as needed for anxiety., Disp: 20 tablet, Rfl: 0   metoprolol  tartrate (LOPRESSOR) 25 MG tablet, Take 1 tablet (25 mg total) by mouth 2 (two) times daily., Disp: 180 tablet, Rfl: 3   mirtazapine (REMERON) 15 MG tablet, Take 1 tablet (15 mg total) by mouth at bedtime., Disp: 90 tablet, Rfl: 1   naproxen (NAPROSYN) 500 MG tablet, Take 1 tablet (500 mg total) by mouth 2 (two) times daily with a meal., Disp: 20 tablet, Rfl: 2   pantoprazole (PROTONIX) 40 MG tablet, TAKE 1 TABLET BY MOUTH DAILY BEFORE BREAKFAST, Disp: 90 tablet, Rfl: 1   levETIRAcetam (KEPPRA) 750 MG tablet, Take 1 tablet (750 mg total) by mouth 2 (two) times daily., Disp: 60 tablet, Rfl: 2   nicotine (NICODERM CQ - DOSED IN MG/24 HOURS) 21 mg/24hr patch, Place 1 patch (21 mg total) onto the skin daily., Disp: 30 patch, Rfl: 1  ------------------------------------------------------------------------- Social History   Tobacco Use   Smoking status: Every Day    Packs/day: 2.00    Years: 51.00    Total pack years: 102.00    Types: Cigarettes    Start date: 08/12/1994   Smokeless tobacco: Never   Tobacco comments:    Smoking 1 ppd s of 11/12/2021 hfb   Vaping Use   Vaping Use: Never used  Substance Use Topics   Alcohol use: No    Alcohol/week: 0.0 standard drinks of alcohol   Drug use: No    Comment: approx. a month quit    Review of Systems Per HPI unless specifically indicated above     Objective:    BP 110/73   Pulse 98   Ht 5\' 9"  (1.753  m)   Wt 111 lb 9.6 oz (50.6 kg)   SpO2 99%   BMI 16.48 kg/m   Wt Readings from Last 3 Encounters:  03/19/22 111 lb 9.6 oz (50.6 kg)  11/12/21 119 lb 3.2 oz (54.1 kg)  10/05/21 117 lb 1 oz (53.1 kg)    Physical Exam Vitals and nursing note reviewed.  Constitutional:      General: He is not in acute distress.    Appearance: He is well-developed. He is not diaphoretic.     Comments: Chronically ill thin appearing comfortable, cooperative  HENT:     Head: Normocephalic and atraumatic.  Eyes:     General:        Right eye: No  discharge.        Left eye: No discharge.     Conjunctiva/sclera: Conjunctivae normal.  Neck:     Thyroid: No thyromegaly.  Cardiovascular:     Rate and Rhythm: Normal rate and regular rhythm.     Pulses: Normal pulses.     Heart sounds: Normal heart sounds. No murmur heard. Pulmonary:     Effort: Pulmonary effort is normal. No respiratory distress.     Breath sounds: Normal breath sounds. No wheezing or rales.  Musculoskeletal:        General: Normal range of motion.     Cervical back: Normal range of motion and neck supple.  Lymphadenopathy:     Cervical: No cervical adenopathy.  Skin:    General: Skin is warm and dry.     Findings: No erythema or rash.  Neurological:     Mental Status: He is alert and oriented to person, place, and time. Mental status is at baseline.  Psychiatric:        Behavior: Behavior normal.     Comments: Well groomed, good eye contact, normal speech and thoughts     CT Head Wo Contrast  Anatomical Region Laterality Modality  Head -- Computed Tomography   Impression  No acute intracranial abnormalities.   Interval decreased left hemispheric chronic subdural hematoma and near complete resolution of midline shift. Narrative  EXAM: Computed tomography, head or brain without contrast material.  DATE: 02/15/2022 8:21 AM  ACCESSION: [id number]  DICTATED: 02/15/2022 8:25 AM  INTERPRETATION LOCATION: [place]   CLINICAL INDICATION: 66 years old Male with SDH  - S06.5XAA - Subdural hematoma (CMS - HCC)     COMPARISON: CT head without contrast 01/11/2022   TECHNIQUE: Axial CT images of the head  from skull base to vertex without contrast.   FINDINGS:  Interval decreased in volume of left hemispheric chronic subdural hematoma measuring up to 5 mm in axial thickness (5:23), previously 9 mm. Interval near complete resolution of the rightward midline shift now measuring 2 mm, previously 6 mm.   There is no midline shift. No mass lesion. There  is no evidence of acute infarct. No acute intracranial hemorrhage. No fractures are evident. Small mucous retention cysts within the maxillary sinuses. The sinuses are otherwise pneumatized. Leftward deviation of nasal septum with bony spur. Decreased aeration of the right mastoid air cells. Procedure Note  Dale [place], MD - 02/15/2022  Formatting of this note might be different from the original.  EXAM: Computed tomography, head or brain without contrast material.  DATE: 02/15/2022 8:21 AM  ACCESSION: [id number]  DICTATED: 02/15/2022 8:25 AM  INTERPRETATION LOCATION: [place]   CLINICAL INDICATION: 66 years old Male with SDH  - S06.5XAA - Subdural hematoma (CMS - HCC)  COMPARISON: CT head without contrast 01/11/2022   TECHNIQUE: Axial CT images of the head  from skull base to vertex without contrast.   FINDINGS:  Interval decreased in volume of left hemispheric chronic subdural hematoma measuring up to 5 mm in axial thickness (5:23), previously 9 mm. Interval near complete resolution of the rightward midline shift now measuring 2 mm, previously 6 mm.   There is no midline shift. No mass lesion. There is no evidence of acute infarct. No acute intracranial hemorrhage. No fractures are evident. Small mucous retention cysts within the maxillary sinuses. The sinuses are otherwise pneumatized. Leftward deviation of nasal septum with bony spur. Decreased aeration of the right mastoid air cells.   IMPRESSION:  No acute intracranial abnormalities.   Interval decreased left hemispheric chronic subdural hematoma and near complete resolution of midline shift. Exam End: 02/15/22 08:21   Specimen Collected: 02/15/22 08:25 Last Resulted: 02/15/22 09:29  Received From: [facility]  Result Received: 02/23/22 08:00     Results for orders placed or performed during the hospital encounter of 06/22/21  I-STAT creatinine  Result Value Ref Range   Creatinine, Ser 0.90 0.61 - 1.24 mg/dL       Assessment & Plan:   Problem List Items Addressed This Visit     Centrilobular emphysema (HCC)   Relevant Medications   nicotine (NICODERM CQ - DOSED IN MG/24 HOURS) 21 mg/24hr patch   COPD (chronic obstructive pulmonary disease) (HCC)   Relevant Medications   nicotine (NICODERM CQ - DOSED IN MG/24 HOURS) 21 mg/24hr patch   Hypertension   Relevant Medications   metoprolol tartrate (LOPRESSOR) 25 MG tablet   Severe protein-calorie malnutrition (HCC)   Small cell lung cancer (HCC)   Tobacco abuse   Relevant Medications   nicotine (NICODERM CQ - DOSED IN MG/24 HOURS) 21 mg/24hr patch   Other Visit Diagnoses     Seizure disorder ([place])    -  Primary   Relevant Medications   levETIRAcetam (KEPPRA) 750 MG tablet   Other Relevant Orders   Ambulatory referral to Neurology      Breakthrough absence seizures Will re order Keppra 750mg  BID Referral to Neuro, unable to schedule w/ [facility] on discharge Refer to [facility] Neuro Discussion on dosing per day  Nicotine dependence, abuse Re order nicotine patches, use as instructed   He is using hospital bed regularly with improvement. By using hospital bed he is able to improve his position to allow improved breathing for his emphysema COPD and lung cancer. Position not able to be obtained by regular bed. He has benefit from continued use of this medical equipment. He is high risk of aspiration as well and upright position helps prevent this.  Orders Placed This Encounter  Procedures   Ambulatory referral to Neurology    Referral Priority:   Routine    Referral Type:   Consultation    Referral Reason:   Specialty Services Required    Requested Specialty:   Neurology    Number of Visits Requested:   1      Meds ordered this encounter  Medications   levETIRAcetam (KEPPRA) 750 MG tablet    Sig: Take 1 tablet (750 mg total) by mouth 2 (two) times daily.    Dispense:  60 tablet    Refill:  2   nicotine (NICODERM CQ - DOSED IN  MG/24 HOURS) 21 mg/24hr patch    Sig: Place 1 patch (21 mg total) onto the skin daily.  Dispense:  30 patch    Refill:  1   metoprolol tartrate (LOPRESSOR) 25 MG tablet    Sig: Take 1 tablet (25 mg total) by mouth 2 (two) times daily.    Dispense:  180 tablet    Refill:  3    Follow up plan: Return if symptoms worsen or fail to improve.   Nobie Putnam, [facility] 03/19/2022, 9:27 AM

## 2022-04-20 ENCOUNTER — Emergency Department: Payer: Medicaid Other

## 2022-04-20 ENCOUNTER — Emergency Department
Admission: EM | Admit: 2022-04-20 | Discharge: 2022-04-20 | Disposition: A | Discharge status: Home / Self Care | Payer: Medicaid Other | Attending: Emergency Medicine | Admitting: Emergency Medicine

## 2022-04-20 DIAGNOSIS — J45909 Unspecified asthma, uncomplicated: Secondary | ICD-10-CM | POA: Diagnosis not present

## 2022-04-20 DIAGNOSIS — Z85118 Personal history of other malignant neoplasm of bronchus and lung: Secondary | ICD-10-CM | POA: Insufficient documentation

## 2022-04-20 DIAGNOSIS — Z7982 Long term (current) use of aspirin: Secondary | ICD-10-CM | POA: Insufficient documentation

## 2022-04-20 DIAGNOSIS — I10 Essential (primary) hypertension: Secondary | ICD-10-CM | POA: Insufficient documentation

## 2022-04-20 DIAGNOSIS — R519 Headache, unspecified: Secondary | ICD-10-CM | POA: Diagnosis present

## 2022-04-20 DIAGNOSIS — J449 Chronic obstructive pulmonary disease, unspecified: Secondary | ICD-10-CM | POA: Diagnosis not present

## 2022-04-20 MED ORDER — LACTATED RINGERS IV BOLUS
1000.0000 mL | Freq: Once | INTRAVENOUS | Status: AC
  Administered 2022-04-20: 1000 mL via INTRAVENOUS

## 2022-04-20 MED ORDER — KETOROLAC TROMETHAMINE 15 MG/ML IJ SOLN
15.0000 mg | Freq: Once | INTRAMUSCULAR | Status: AC
  Administered 2022-04-20: 15 mg via INTRAVENOUS
  Filled 2022-04-20: qty 1

## 2022-04-20 MED ORDER — METOCLOPRAMIDE HCL 5 MG/ML IJ SOLN
10.0000 mg | Freq: Once | INTRAMUSCULAR | Status: AC
  Administered 2022-04-20: 10 mg via INTRAVENOUS
  Filled 2022-04-20: qty 2

## 2022-04-20 NOTE — ED Triage Notes (Signed)
Pt presents to ED from home C/O headache. Pt friend with him reports recent hospitalization at [facility] for brain bleed, seizures.

## 2022-04-20 NOTE — Discharge Instructions (Signed)
CAT scan did not show any new bleeding on your brain.  You can take Tylenol for headache.  Please follow-up with your neurosurgeon regarding your headaches.

## 2022-04-20 NOTE — ED Provider Notes (Signed)
[facility] Provider Note    Event Date/Time   First MD Initiated Contact with Patient 04/20/22 1600     (approximate)   History   Headache   HPI  David [name] is a 66 y.o. male  with pmh COPD, small cell carcinoma the right lung, melanoma with recent subacute left-sided subdural who presents with headache.  Patient endorses about 1 week of bitemporal headache.  Is been constant.  Not worse in any time of day.  He denies associated visual change numbness tingling weakness.  No nausea or vomiting.  Does sometimes feel like he is stuttering but denies any other neurologic symptoms.  Patient distally saying that this is a new headache but when asked specifically he says he has had intermittent headaches since he had a subdural.  Was admitted to [facility] about 4 months ago with subdural and meningoencephalitis.  Tells me he has a headache about 4 days a week and feels similar to this.  Takes aspirin for it sometimes.  Typically aspirin makes it go away but has not over the last week.  Denies fevers or chills.  Denies trauma no neck pain.     Past Medical History:  Diagnosis Date   Arthritis    Asthma    COPD (chronic obstructive pulmonary disease) ([place])    not on home o2   Gout    History of kidney stones    History of melanoma    Hypertension    Oxygen deficiency    O2 1 LITER  HS   Pain    chronic pain all over   Small cell lung cancer ([place])    melanoma   Tobacco use     Patient Active Problem List   Diagnosis Date Noted   Chronic respiratory failure with hypoxia ([place]) 11/12/2021   Centrilobular emphysema ([place]) 08/22/2020   Gastroesophageal reflux disease without esophagitis 08/22/2020   Severe protein-calorie malnutrition ([place]) 10/27/2017   Hypertension 04/26/2017   Chronic pain syndrome 09/28/2016   Long term current use of opiate analgesic 09/28/2016   Long term prescription opiate use 09/28/2016   Disturbance of skin sensation 09/28/2016    Lung mass    Anemia 07/19/2016   Multiple lung nodules 07/19/2016   Chest pain 07/18/2016   Primary osteoarthritis involving multiple joints 06/23/2016   Chronic bursitis of left shoulder 06/23/2016   Chronic pain of both shoulders 06/23/2016   History of substance abuse ([place]) 06/23/2016   Tobacco abuse 04/20/2016   History of melanoma 04/20/2016   COPD (chronic obstructive pulmonary disease) ([place]) 04/20/2016   Gout 04/20/2016   Adjustment disorder 04/20/2016   Insomnia 04/20/2016   Chronic pain due to neoplasm 04/20/2016   Opiate use 04/20/2016   Small cell lung cancer ([place]) 09/08/2015     Physical Exam  Triage Vital Signs: ED Triage Vitals  Enc Vitals Group     BP 04/20/22 1449 97/63     Pulse Rate 04/20/22 1449 99     Resp 04/20/22 1449 18     Temp 04/20/22 1449 (!) 97.5 F (36.4 C)     Temp Source 04/20/22 1449 Oral     SpO2 04/20/22 1449 96 %     Weight 04/20/22 1451 110 lb (49.9 kg)     Height 04/20/22 1451 5\' 9"  (1.753 m)     Head Circumference --      Peak Flow --      Pain Score 04/20/22 1451 10  Pain Loc --      Pain Edu? --      Excl. in [place]? --     Most recent vital signs: Vitals:   04/20/22 1449 04/20/22 1735  BP: 97/63 98/61  Pulse: 99 87  Resp: 18 16  Temp: (!) 97.5 F (36.4 C) 97.8 F (36.6 C)  SpO2: 96% 96%     General: Awake, no distress.  CV:  Good peripheral perfusion.  Resp:  Normal effort.  Abd:  No distention.  Neuro:             Awake, Alert, Oriented x 3  Other:  Aox3, nml speech  PERRL, EOMI, face symmetric, nml tongue movement  5/5 strength in the BL upper and lower extremities  Sensation grossly intact in the BL upper and lower extremities  Finger-nose-finger intact BL  No temporal tenderness   ED Results / Procedures / Treatments  Labs (all labs ordered are listed, but only abnormal results are displayed) Labs Reviewed - No data to display   EKG     RADIOLOGY I reviewed and interpreted the CT scan of the  brain which does not show any acute intracranial process    PROCEDURES:  Critical Care performed: No  Procedures     MEDICATIONS ORDERED IN ED: Medications  ketorolac (TORADOL) 15 MG/ML injection 15 mg (15 mg Intravenous Given 04/20/22 1730)  metoCLOPramide (REGLAN) injection 10 mg (10 mg Intravenous Given 04/20/22 1730)  lactated ringers bolus 1,000 mL (1,000 mLs Intravenous New Bag/Given 04/20/22 1732)     IMPRESSION / MDM / ASSESSMENT AND PLAN / ED COURSE  I reviewed the triage vital signs and the nursing notes.                              Patient's presentation is most consistent with acute presentation with potential threat to life or bodily function.  Differential diagnosis includes, but is not limited to, acute subdural, brain mass, temporal arteritis, cervical artery dissection, cerebral venous sinus thrombosis, migraine headache, tension headache  The patient is a 66 year old male with history of subdural who presents with a week of headache.  He has had headaches since he was hospitalized several months ago at [facility] for subdural and meningoencephalitis.  He has had fairly consistent headache over the last week.  Denies neurologic symptoms no recent illnesses no fevers or chills.  He has no new neurologic symptoms.  No trauma.  Patient's blood pressure is borderline low but the rest of his vitals are reassuring.  Overall he looks chronically ill and thin but nontoxic.  His neurologic exam is nonfocal.  I do not appreciate any aphasia on exam.  Overall reassured that this headache has been going on for several months.  Makes me less concerned about serious underlying pathology such as subarachnoid cervical artery dissection or cervical venous sinus fibrosis.  Could be headache related to his history of subdural.  Primary headache disorder including migraine headache or tension headache also possible.  CT head obtained given his history does not show any new bleeding today or other  masses.  Plan to treat with migraine cocktail.  Anticipate discharge with neurology and neurosurgery follow-up.    Resting comfortably after migraine cocktail.  Headache improved.  He is appropriate for discharge.   FINAL CLINICAL IMPRESSION(S) / ED DIAGNOSES   Final diagnoses:  Nonintractable headache, unspecified chronicity pattern, unspecified headache type     Rx / DC Orders  ED Discharge Orders     None        Note:  This document was prepared using Dragon voice recognition software and may include unintentional dictation errors.   Rada Hay, MD 04/20/22 [phone]

## 2022-08-03 ENCOUNTER — Other Ambulatory Visit: Payer: Self-pay | Admitting: Internal Medicine

## 2022-08-03 DIAGNOSIS — J432 Centrilobular emphysema: Secondary | ICD-10-CM

## 2022-08-05 NOTE — Telephone Encounter (Signed)
Requested medication (s) are due for refill today: Yes  Requested medication (s) are on the active medication list: Yes  Last refill:  06/04/22  Future visit scheduled: No  Notes to clinic:  Prescription expired.    Requested Prescriptions  Pending Prescriptions Disp Refills   VENTOLIN HFA 108 (90 Base) MCG/ACT inhaler [Pharmacy Med Name: VENTOLIN HFA 90 MCG INHALER] 18 each 5    Sig: TAKE 2 PUFFS BY MOUTH EVERY 6 HOURS AS NEEDED FOR WHEEZE OR SHORTNESS OF BREATH     Pulmonology:  Beta Agonists 2 Passed - 08/03/2022 10:53 AM      Passed - Last BP in normal range    BP Readings from Last 1 Encounters:  04/20/22 (!) 103/54         Passed - Last Heart Rate in normal range    Pulse Readings from Last 1 Encounters:  04/20/22 81         Passed - Valid encounter within last 12 months    Recent Outpatient Visits           4 months ago Seizure disorder [facility])   [facility] Olin Hauser, DO   10 months ago Centrilobular emphysema [facility])   [facility] Olin Hauser, DO   1 year ago Bilateral impacted cerumen   [place], NP   1 year ago Centrilobular emphysema [facility])   [place], DO   4 years ago Breast pain, left   [facility] [place], Devonne Doughty, [place]

## 2022-08-31 ENCOUNTER — Telehealth: Payer: Self-pay

## 2022-08-31 NOTE — Telephone Encounter (Signed)
Palliative care outreach to patient to review new PC criteria.  Call unsuccessful. No answer at [phone] or  [phone] and unable to LVM.  LVM on [phone].

## 2022-10-01 ENCOUNTER — Telehealth: Payer: Self-pay

## 2022-10-01 NOTE — Telephone Encounter (Signed)
2nd attempt: Palliative care outreach to patient to review new PC criteria.   Call unsuccessful. No answer at [phone] (mailbox full) or  [phone] (no longer in service).  [phone] no longer belongs to patient.

## 2022-10-01 NOTE — Telephone Encounter (Signed)
PC outreach to daughter, [name], [phone] in an attempt to connect with patient.   This number is now disconnected.

## 2022-10-04 ENCOUNTER — Telehealth: Payer: Self-pay

## 2022-10-04 NOTE — Telephone Encounter (Signed)
3rd attempt: Palliative care outreach to patient to review new PC criteria.   Call unsuccessful. SW LVM at [phone].  Patient will be discharged from [facility] services at this time, due failed responses to outreaches.

## 2022-11-22 ENCOUNTER — Other Ambulatory Visit: Payer: Self-pay | Admitting: Family Medicine

## 2022-11-22 DIAGNOSIS — K219 Gastro-esophageal reflux disease without esophagitis: Secondary | ICD-10-CM

## 2022-11-22 DIAGNOSIS — R630 Anorexia: Secondary | ICD-10-CM

## 2022-11-23 NOTE — Telephone Encounter (Signed)
Requested Prescriptions  Pending Prescriptions Disp Refills   pantoprazole (PROTONIX) 40 MG tablet [Pharmacy Med Name: PANTOPRAZOLE SOD DR 40 MG TAB] 90 tablet 1    Sig: TAKE 1 TABLET BY MOUTH EVERY DAY BEFORE BREAKFAST     Gastroenterology: Proton Pump Inhibitors Passed - 11/22/2022  2:31 PM      Passed - Valid encounter within last 12 months    Recent Outpatient Visits           8 months ago Seizure disorder [facility])   [facility] Smitty Cords, DO   1 year ago Centrilobular emphysema [facility])   [facility] Smitty Cords, DO   1 year ago Bilateral impacted cerumen   [facility] Gabriel Cirri, NP   2 years ago Centrilobular emphysema [facility])   [facility] Smitty Cords, DO   4 years ago Breast pain, left   [facility] [place], Netta Neat, [place]

## 2022-12-06 ENCOUNTER — Ambulatory Visit: Payer: Medicare Other | Admitting: Family Medicine

## 2022-12-10 ENCOUNTER — Encounter: Payer: Self-pay | Admitting: Family Medicine

## 2022-12-10 ENCOUNTER — Ambulatory Visit (INDEPENDENT_AMBULATORY_CARE_PROVIDER_SITE_OTHER): Payer: Medicare Other | Admitting: Family Medicine

## 2022-12-10 VITALS — BP 106/78 | HR 67 | Ht 69.0 in | Wt 108.0 lb

## 2022-12-10 DIAGNOSIS — Z85820 Personal history of malignant melanoma of skin: Secondary | ICD-10-CM | POA: Diagnosis not present

## 2022-12-10 DIAGNOSIS — D492 Neoplasm of unspecified behavior of bone, soft tissue, and skin: Secondary | ICD-10-CM | POA: Diagnosis not present

## 2022-12-10 DIAGNOSIS — Z85828 Personal history of other malignant neoplasm of skin: Secondary | ICD-10-CM

## 2022-12-10 NOTE — Progress Notes (Signed)
Subjective:    Patient ID: David [name], male    DOB: 13-Feb-1956, 67 y.o.   MRN: [id number]  David [name] is a 67 y.o. male presenting on 12/10/2022 for Skin Problem (Spot on upper back, present a couple months per patient, painful when pressure applied)   HPI  Right Ear, wax Reports Right ear blocked with wax. He has not flushed it recently. He will use the Debrox, he declines ear flushing lavage today. It has made him dizzy before.  Skin Growth abnormal On back large skin growth on back for past several months increased size, unsure exactly he does not look at it. It has not bleed but it is painful and inflamed. If bumped or pressure directly on his back when laying down it causes him pain. He has seen Dermatology years ago but not recently locally. He would like the spot removed. He has sun exposure and prior  H/o Melanoma - Reports chronic history of skin cancer including melanoma >3 years ago on face and SCC multiple locations, s/p surgical resections/biopsy       09/18/2021    8:29 AM 08/22/2020    9:13 AM 01/09/2018    4:14 PM  Depression screen PHQ 2/9  Decreased Interest 0 0 0  Down, Depressed, Hopeless 3 0 0  PHQ - 2 Score 3 0 0  Altered sleeping 3    Tired, decreased energy 3    Change in appetite 3    Feeling bad or failure about yourself  3    Trouble concentrating 0    Moving slowly or fidgety/restless 0    Suicidal thoughts 0    PHQ-9 Score 15    Difficult doing work/chores Not difficult at all      Social History   Tobacco Use   Smoking status: Every Day    Packs/day: 1.00    Years: 51.00    Additional pack years: 0.00    Total pack years: 51.00    Types: Cigarettes    Start date: 08/12/1994   Smokeless tobacco: Never   Tobacco comments:    Smoking 1 ppd s of 11/12/2021 hfb   Vaping Use   Vaping Use: Never used  Substance Use Topics   Alcohol use: No    Alcohol/week: 0.0 standard drinks of alcohol   Drug use: No    Comment: approx. a month  quit    Review of Systems Per HPI unless specifically indicated above     Objective:    BP 106/78   Pulse 67   Ht 5\' 9"  (1.753 m)   Wt 108 lb (49 kg)   SpO2 98%   BMI 15.95 kg/m   Wt Readings from Last 3 Encounters:  12/10/22 108 lb (49 kg)  04/20/22 110 lb (49.9 kg)  03/19/22 111 lb 9.6 oz (50.6 kg)    Physical Exam Vitals and nursing note reviewed.  Constitutional:      General: He is not in acute distress.    Appearance: Normal appearance. He is well-developed. He is not diaphoretic.     Comments: Well-appearing, comfortable, cooperative  HENT:     Head: Normocephalic and atraumatic.     Right Ear: There is impacted cerumen.     Left Ear: There is no impacted cerumen.  Eyes:     General:        Right eye: No discharge.        Left eye: No discharge.     Conjunctiva/sclera: Conjunctivae normal.  Cardiovascular:     Rate and Rhythm: Normal rate.  Pulmonary:     Effort: Pulmonary effort is normal.  Skin:    General: Skin is warm and dry.     Findings: Lesion (See photos) present. No erythema or rash.  Neurological:     Mental Status: He is alert and oriented to person, place, and time.  Psychiatric:        Mood and Affect: Mood normal.        Behavior: Behavior normal.        Thought Content: Thought content normal.     Comments: Well groomed, good eye contact, normal speech and thoughts     Back       Results for orders placed or performed during the hospital encounter of 06/22/21  I-STAT creatinine  Result Value Ref Range   Creatinine, Ser 0.90 0.61 - 1.24 mg/dL      Assessment & Plan:   Problem List Items Addressed This Visit     History of melanoma   Relevant Orders   Ambulatory referral to Dermatology   Other Visit Diagnoses     Abnormal skin growth    -  Primary   Relevant Orders   Ambulatory referral to Dermatology   History of skin cancer       Relevant Orders   Ambulatory referral to Dermatology       Ear Cerumen  impaction R sided He declines ear flushing lavage today due to causing dizziness. He prefers to use ear drops already. Debrox.   Urgent Referral to Dermatology for excision of large abnormal growth on back. Concern possible cancerous growth. He has history melanoma.  Scheduled today with patient in office. To [facility] Monday 1045am  Orders Placed This Encounter  Procedures   Ambulatory referral to Dermatology    Referral Priority:   Urgent    Referral Type:   Consultation    Referral Reason:   Specialty Services Required    Requested Specialty:   Dermatology    Number of Visits Requested:   1     No orders of the defined types were placed in this encounter.     Follow up plan: No follow-ups on file.   Saralyn Pilar, DO [facility] 12/10/2022, 11:10 AM

## 2022-12-10 NOTE — Patient Instructions (Signed)
Thank you for coming to the office today.  Stay tuned for Dermatology referral.  [facility] Dermatologist in [place], [facility] Address: [address] [place], [place] [address] Phone: [phone]  [facility]   [address] [place], [place] [address] Hours: 8AM-5PM Phone: [phone]  [facility] Dermatology [address] [place], [place] [address] Phone: [phone]  Please schedule a Follow-up Appointment to: No follow-ups on file.  If you have any other questions or concerns, please feel free to call the office or send a message through MyChart. You may also schedule an earlier appointment if necessary.  Additionally, you may be receiving a survey about your experience at our office within a few days to 1 week by e-mail or mail. We value your feedback.  Saralyn Pilar, DO [facility] [place]

## 2023-02-16 ENCOUNTER — Ambulatory Visit: Payer: Self-pay | Admitting: General Surgery

## 2023-02-16 NOTE — H&P (Signed)
PATIENT PROFILE: David [name] is a 67 y.o. 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  PCP:  Dickie La, MD  HISTORY OF PRESENT ILLNESS: [name] reports he has a large mass in the left upper back that was removed by dermatology.  It showed squamous cell carcinoma.  The mass has grown about the same size in about a month.  There is pain on the left upper back.  No radiation.  Pain aggravated by applying pressure.  No alleviating factors.  Patient with history of skin cancer in the past.  Patient smoker.  Patient was consulted by dermatology due to size of the mass to be excised surgically.   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 History of chronic respiratory failure on oxygen.  Today in the office walk test shows no desaturations with O2 saturations maintaining at 99% on room air while walking.  We will set patient up for an overnight oximetry test to check for nocturnal hypoxemia      Centrilobular emphysema (CMS/HHS-HCC) 08/22/2020   Overview    Last Assessment & Plan: Formatting of this note might be different from the original. Stable centrilobular emphysema COPD Without exacerbation flare Improved on Spiriva maintenance - needs refill Needs albuterol refill for PRN use Followed by Dr Orlie Dakin for lung cancer      Gastroesophageal reflux disease without esophagitis 08/22/2020   Overview    Last Assessment & Plan: Formatting of this note might be different from the original. Controlled on PPI Refill medication       Severe protein-calorie malnutrition (CMS/HHS-HCC) 10/27/2017   Overview    Last Assessment & Plan: Formatting of this note might be different from the original. High-protein diet      Hypertension 04/26/2017   Overview    Last Assessment & Plan: Formatting of this note might be different from the original. blood pressure control important in reducing the progression of atherosclerotic disease. On appropriate oral medications.      Chronic pain syndrome (Chronic) 09/28/2016   Disturbance of skin sensation 09/28/2016   Long term (current) use of opiate analgesic (Chronic) 09/28/2016   Anemia 07/19/2016   Multiple lung nodules 07/19/2016   Chest pain 07/18/2016   Chronic bursitis of left shoulder (Chronic) 06/23/2016   Overview    Last Assessment & Plan: Formatting of this note might be different from the original. Consistent with subacute on chronic Left shoulder bursitis vs rotator cuff tendinopathy with some reduced active ROM, possibly degenerative tearing but no acute shoulder weakness. 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 - No clear etiology of injury, but known arthritis underlying. Not consistent with gout. - No recent imaging. Last R shoulder 02/2016 without arthropathy Plan: 1. Left shoulder subacromial steroid injection today, see procedure note 2. Increase Meloxicam from 7.5 to 15mg  daily, refill sent 3. May take Tylenol Ex Str 1-2 q 6 hr PRN 4. Relative rest but keep shoulder mobile, demonstrated ROM exercises, avoid heavy lifting 5. May try heating pad PRN 6. RTC 1  month re-evaluation, if not improved consider X-rays eval for arthritis, may consider baclofen muscle relaxer if hard to sleep at night, patient is not good candidate for any aggressive interventions or surgical fix if soft tissue tear given co morbidities, cancer, and frailty      Chronic pain of both shoulders (Chronic) 06/23/2016   Overview    Last Assessment & Plan: Formatting of this note might be  different from the original. Secondary to arthritis, DJD, and now with L bursitis      History of substance abuse (CMS/HHS-HCC) 06/23/2016   Overview    Last Assessment & Plan: Formatting of this note might be different from the original. Stable, still smoking, may try OTC NRT No other substances      Primary osteoarthritis involving multiple joints (Chronic) 06/23/2016   Overview    Last Assessment & Plan: Formatting of this note might be different from the original. Re order baclofen PRN      Adjustment disorder 04/20/2016   Overview    Last Assessment & Plan: Formatting of this note might be different from the original. Most consistent with adjustment and some depressive symptoms given recent 1 yr history with dx lung cancer and some chronic pain. No prior psych history. Other than opiate medications, no other drug use. Not consistent with major depression, some insomnia related to chronic pain mostly. Difficult to determine if fatigue / some appetite loss and weight loss is related to cancer vs chronic pain / adjustment disorder. - Treat underlying chronic pain - Discussion about potential medications for mood / sleep such as Remeron (may improve appetite / wt gain) also consider Cymbalta (chronic pain), offered future referral to therapy / counseling as well. 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 Chronic pain Controlled on Gabapentin and Baclofen      COPD (chronic obstructive pulmonary disease) (CMS/HHS-HCC) 04/20/2016   Overview    Last Assessment & Plan: Formatting of this note is different from the original. Moderate COPD with emphysema-recommend triple therapy. Smoking cessation discussed. Declines flu and pneumonia vaccine. Plan Patient Instructions Continue on BREZTRI 2 puffs Twice daily  , rinse after use .  Albuterol inhaler As needed   Activity as tolerated. High protein diet Work on not smoking . Set up overnight oximetry test Follow-up in 3 to 4 months with Dr. Belia Heman and As needed      Gout (Chronic) 04/20/2016   Overview    Last Assessment & Plan: Formatting of this note might be different from the original. Chronic problem, without acute flare. No evidence of tophi or chronic joint complications Refill Colchicine PRN flare, daily within 72 hr then BID up to 1 week or resolve Future follow-up consider uric acid and other uric acid lowering meds if recurrent flares      History of melanoma 04/20/2016   Overview    Last Assessment & Plan: Formatting of this note might be different from the original. On face, s/p surgical resection. Also h/o SCC. High risk with prior sun exposure. Referral to Dermatology - [facility] to re-establish for chronic skin surveillance and monitoring.      Insomnia 04/20/2016   Overview    Last Assessment & Plan: Formatting of this note might be different from the original. Chronic issue Trial on Mirtazapine 15mg   nightly for sleep and improve appetite / nutrition      Opiate use (Chronic) 04/20/2016   Overview    Last Assessment & Plan: Formatting of this note might be different from the original. Prior history of opiate use regularly during chemotherapy and oncology treatment, now he has been off opiates >2-3 weeks. No clinical diagnosis of dependence. - Will not continue chronic opiate rx for pain, will use tramadol temporarily, obtained UDS and referral to Pain Management      Tobacco abuse 04/20/2016   Overview    Last Assessment & Plan: Formatting of this note might be different from the original. Active smoker, up to 2ppd, chronic problem. Not ready to quit. May consider trial NRT patches      Small cell lung cancer (CMS/HHS-HCC) 09/08/2015   Overview    Last Assessment & Plan: Formatting of this note might be different from the original. Continue follow-up  with oncology and serial CT       GENERAL REVIEW OF SYSTEMS:   General ROS: negative for - chills, fatigue, fever, weight gain or weight loss Allergy and Immunology ROS: negative for - hives  Hematological and Lymphatic ROS: negative for - bleeding problems or bruising, negative for palpable nodes Endocrine ROS: negative for - heat or cold intolerance, hair changes Respiratory ROS: negative for - cough, shortness of breath or wheezing Cardiovascular ROS: no chest pain or palpitations GI ROS: negative for nausea, vomiting, abdominal pain, diarrhea, constipation Musculoskeletal ROS: negative for - joint swelling or muscle pain Neurological ROS: negative for - confusion, syncope Dermatological ROS: negative for pruritus and rash Psychiatric: negative for anxiety, depression, difficulty sleeping and memory loss  MEDICATIONS: Current Outpatient Medications  Medication Sig Dispense Refill   albuterol 90 mcg/actuation inhaler Inhale 2 inhalations into the lungs every 6 (six) hours as needed for Wheezing     metoprolol tartrate (LOPRESSOR) 25 MG tablet Take 25 mg by mouth 2 (two) times daily     baclofen (LIORESAL) 10 MG tablet Take 5 mg by mouth 3 (three) times daily as needed (muscle spasms) (Patient not taking: Reported on 07/19/2022)     budesonide-formoteroL (SYMBICORT) 160-4.5 mcg/actuation inhaler Inhale 2 inhalations into the lungs 2 (two) times daily (Patient not taking: Reported on 02/15/2023)     budesonide-glycopyrrolate-formoterol (BREZTRI AEROSPHERE) 160-9-4.8 mcg/actuation inhaler Inhale 2 inhalations into the lungs 2 (two) times daily (Patient not taking: Reported on 02/15/2023)     levETIRAcetam (KEPPRA) 750 MG tablet Take 1 tablet (750 mg total) by mouth 2 (two) times daily for 90 days 180 tablet 3   mirtazapine (REMERON) 15 MG tablet Take 15 mg by mouth at bedtime (Patient not taking: Reported on 07/19/2022)     naproxen (NAPROSYN) 500 MG tablet Take 500 mg by mouth 2 (two) times  daily with meals (Patient not taking: Reported on 07/19/2022)     nicotine (NICODERM CQ) 21 mg/24 hr patch Place 1 patch onto the skin daily (Patient not taking: Reported on 02/15/2023)     pantoprazole (PROTONIX) 40 MG DR tablet Take 40 mg by mouth once daily (Patient not taking: Reported on 02/15/2023)     tiotropium (SPIRIVA WITH HANDIHALER) 18 mcg inhalation capsule Place 18 mcg into inhaler and inhale once daily     No current facility-administered medications for this visit.    ALLERGIES: Acetaminophen and Hydrocodone-acetaminophen  PAST MEDICAL HISTORY: No past medical history on file.  PAST SURGICAL HISTORY: No past surgical history on file.   FAMILY HISTORY: No family  history on file.   SOCIAL HISTORY: Social History   Socioeconomic History   Marital status: Widowed  Tobacco Use   Smoking status: Every Day    Current packs/day: 0.50    Types: Cigarettes   Smokeless tobacco: Never  Substance and Sexual Activity   Alcohol use: Never   Drug use: Never   Social Determinants of Health   Financial Resource Strain: Medium Risk (01/05/2022)   Received from [facility], [facility]   Overall Financial Resource Strain (CARDIA)    Difficulty of Paying Living Expenses: Somewhat hard  Food Insecurity: No Food Insecurity (01/05/2022)   Received from [facility], [facility]   Hunger Vital Sign    Worried About Running Out of Food in the Last Year: Never true    Ran Out of Food in the Last Year: Never true  Transportation Needs: No Transportation Needs (01/05/2022)   Received from [facility], [facility]   [facility] - Transportation    Lack of Transportation (Medical): No    Lack of Transportation (Non-Medical): No    PHYSICAL EXAM: Vitals:   02/15/23 1503  BP: 96/64  Pulse: 73   Body mass index is 17.28 kg/m. Weight: 53.1 kg (117 lb)   GENERAL: Alert, active, oriented x3  HEENT: Pupils equal reactive to light. Extraocular movements are  intact. Sclera clear. Palpebral conjunctiva normal red color.Pharynx clear.  NECK: Supple with no palpable mass and no adenopathy.  LUNGS: Sound clear with no rales rhonchi or wheezes.  HEART: Regular rhythm S1 and S2 without murmur.  ABDOMEN: Soft and depressible, nontender with no palpable mass, no hepatomegaly.   BACK: There is a 4 x 2 x 6 cm mass in the left upper back.  Mass is attached to the skin.  There is protrusion of the mass.  No bleeding.  EXTREMITIES: Well-developed well-nourished symmetrical with no dependent edema.  NEUROLOGICAL: Awake alert oriented, facial expression symmetrical, moving all extremities.  REVIEW OF DATA: I have reviewed the following data today: No visits with results within 3 Month(s) from this visit.  Latest known visit with results is:  Initial consult on 07/19/2022  Component Date Value   Thyroid Stimulating Horm* 07/19/2022 2.276    Vitamin B12 07/19/2022 283 (L)    Vitamin D, 25-Hydroxy - * 07/19/2022 22.1 (L)    Vitamin B6 - [facility] 07/19/2022 2.8 (L)    Magnesium 07/19/2022 1.9    Phosphorus 07/19/2022 4.9      ASSESSMENT: Mr. Montefusco is a 67 y.o. male presenting for consultation for squamous cell carcinoma of the left upper back.  Patient with a huge mass in the left upper back.  Disease positive for squamous of carcinoma.  Unable to be removed locally by dermatology.  Patient needs wider excision.  Due to the size of the mass I discussed with patient that I will need to take him to the operating room for excision of the mass.  I discussed with patient that there is a possibility that I will not be able to completely close the skin.  This will need to heal by secondary intention versus negative pressure dressing.  Will make that decision at the moment of the excision.  Patient endorses she understood the risk and agreed to proceed with excision of the mass.  Squamous cell carcinoma of skin [C44.92]  PLAN: Excision of back squamous cell  carcinoma ([address]) Avoid aspirin of blood thinner 5 days before surgery Contact us if you have  any question   Patient verbalized understanding, all questions were answered, and were agreeable with the plan outlined above.     Carolan Shiver, MD

## 2023-02-16 NOTE — H&P (View-Only) (Signed)
 PATIENT PROFILE: David [name] is a 67 y.o. 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  PCP:  Dickie La, MD  HISTORY OF PRESENT ILLNESS: Mr. David [name] reports he has a large mass in the left upper back that was removed by dermatology.  It showed squamous cell carcinoma.  The mass has grown about the same size in about a month.  There is pain on the left upper back.  No radiation.  Pain aggravated by applying pressure.  No alleviating factors.  Patient with history of skin cancer in the past.  Patient smoker.  Patient was consulted by dermatology due to size of the mass to be excised surgically.   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 History of chronic respiratory failure on oxygen.  Today in the office walk test shows no desaturations with O2 saturations maintaining at 99% on room air while walking.  We will set patient up for an overnight oximetry test to check for nocturnal hypoxemia      Centrilobular emphysema (CMS/HHS-HCC) 08/22/2020   Overview    Last Assessment & Plan: Formatting of this note might be different from the original. Stable centrilobular emphysema COPD Without exacerbation flare Improved on Spiriva maintenance - needs refill Needs albuterol refill for PRN use Followed by Dr Orlie Dakin for lung cancer      Gastroesophageal reflux disease without esophagitis 08/22/2020   Overview    Last Assessment & Plan: Formatting of this note might be different from the original. Controlled on PPI Refill medication       Severe protein-calorie malnutrition (CMS/HHS-HCC) 10/27/2017   Overview    Last Assessment & Plan: Formatting of this note might be different from the original. High-protein diet      Hypertension 04/26/2017   Overview    Last Assessment & Plan: Formatting of this note might be different from the original. blood pressure control important in reducing the progression of atherosclerotic disease. On appropriate oral medications.      Chronic pain syndrome (Chronic) 09/28/2016   Disturbance of skin sensation 09/28/2016   Long term (current) use of opiate analgesic (Chronic) 09/28/2016   Anemia 07/19/2016   Multiple lung nodules 07/19/2016   Chest pain 07/18/2016   Chronic bursitis of left shoulder (Chronic) 06/23/2016   Overview    Last Assessment & Plan: Formatting of this note might be different from the original. Consistent with subacute on chronic Left shoulder bursitis vs rotator cuff tendinopathy with some reduced active ROM, possibly degenerative tearing but no acute shoulder weakness. 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 - No clear etiology of injury, but known arthritis underlying. Not consistent with gout. - No recent imaging. Last R shoulder 02/2016 without arthropathy Plan: 1. Left shoulder subacromial steroid injection today, see procedure note 2. Increase Meloxicam from 7.5 to 15mg  daily, refill sent 3. May take Tylenol Ex Str 1-2 q 6 hr PRN 4. Relative rest but keep shoulder mobile, demonstrated ROM exercises, avoid heavy lifting 5. May try heating pad PRN 6. RTC 1  month re-evaluation, if not improved consider X-rays eval for arthritis, may consider baclofen muscle relaxer if hard to sleep at night, patient is not good candidate for any aggressive interventions or surgical fix if soft tissue tear given co morbidities, cancer, and frailty      Chronic pain of both shoulders (Chronic) 06/23/2016   Overview    Last Assessment & Plan: Formatting of this note might be  different from the original. Secondary to arthritis, DJD, and now with L bursitis      History of substance abuse (CMS/HHS-HCC) 06/23/2016   Overview    Last Assessment & Plan: Formatting of this note might be different from the original. Stable, still smoking, may try OTC NRT No other substances      Primary osteoarthritis involving multiple joints (Chronic) 06/23/2016   Overview    Last Assessment & Plan: Formatting of this note might be different from the original. Re order baclofen PRN      Adjustment disorder 04/20/2016   Overview    Last Assessment & Plan: Formatting of this note might be different from the original. Most consistent with adjustment and some depressive symptoms given recent 1 yr history with dx lung cancer and some chronic pain. No prior psych history. Other than opiate medications, no other drug use. Not consistent with major depression, some insomnia related to chronic pain mostly. Difficult to determine if fatigue / some appetite loss and weight loss is related to cancer vs chronic pain / adjustment disorder. - Treat underlying chronic pain - Discussion about potential medications for mood / sleep such as Remeron (may improve appetite / wt gain) also consider Cymbalta (chronic pain), offered future referral to therapy / counseling as well. 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 Chronic pain Controlled on Gabapentin and Baclofen      COPD (chronic obstructive pulmonary disease) (CMS/HHS-HCC) 04/20/2016   Overview    Last Assessment & Plan: Formatting of this note is different from the original. Moderate COPD with emphysema-recommend triple therapy. Smoking cessation discussed. Declines flu and pneumonia vaccine. Plan Patient Instructions Continue on BREZTRI 2 puffs Twice daily  , rinse after use .  Albuterol inhaler As needed   Activity as tolerated. High protein diet Work on not smoking . Set up overnight oximetry test Follow-up in 3 to 4 months with Dr. Belia Heman and As needed      Gout (Chronic) 04/20/2016   Overview    Last Assessment & Plan: Formatting of this note might be different from the original. Chronic problem, without acute flare. No evidence of tophi or chronic joint complications Refill Colchicine PRN flare, daily within 72 hr then BID up to 1 week or resolve Future follow-up consider uric acid and other uric acid lowering meds if recurrent flares      History of melanoma 04/20/2016   Overview    Last Assessment & Plan: Formatting of this note might be different from the original. On face, s/p surgical resection. Also h/o SCC. High risk with prior sun exposure. Referral to Dermatology - [facility] to re-establish for chronic skin surveillance and monitoring.      Insomnia 04/20/2016   Overview    Last Assessment & Plan: Formatting of this note might be different from the original. Chronic issue Trial on Mirtazapine 15mg   nightly for sleep and improve appetite / nutrition      Opiate use (Chronic) 04/20/2016   Overview    Last Assessment & Plan: Formatting of this note might be different from the original. Prior history of opiate use regularly during chemotherapy and oncology treatment, now he has been off opiates >2-3 weeks. No clinical diagnosis of dependence. - Will not continue chronic opiate rx for pain, will use tramadol temporarily, obtained UDS and referral to Pain Management      Tobacco abuse 04/20/2016   Overview    Last Assessment & Plan: Formatting of this note might be different from the original. Active smoker, up to 2ppd, chronic problem. Not ready to quit. May consider trial NRT patches      Small cell lung cancer (CMS/HHS-HCC) 09/08/2015   Overview    Last Assessment & Plan: Formatting of this note might be different from the original. Continue follow-up  with oncology and serial CT       GENERAL REVIEW OF SYSTEMS:   General ROS: negative for - chills, fatigue, fever, weight gain or weight loss Allergy and Immunology ROS: negative for - hives  Hematological and Lymphatic ROS: negative for - bleeding problems or bruising, negative for palpable nodes Endocrine ROS: negative for - heat or cold intolerance, hair changes Respiratory ROS: negative for - cough, shortness of breath or wheezing Cardiovascular ROS: no chest pain or palpitations GI ROS: negative for nausea, vomiting, abdominal pain, diarrhea, constipation Musculoskeletal ROS: negative for - joint swelling or muscle pain Neurological ROS: negative for - confusion, syncope Dermatological ROS: negative for pruritus and rash Psychiatric: negative for anxiety, depression, difficulty sleeping and memory loss  MEDICATIONS: Current Outpatient Medications  Medication Sig Dispense Refill   albuterol 90 mcg/actuation inhaler Inhale 2 inhalations into the lungs every 6 (six) hours as needed for Wheezing     metoprolol tartrate (LOPRESSOR) 25 MG tablet Take 25 mg by mouth 2 (two) times daily     baclofen (LIORESAL) 10 MG tablet Take 5 mg by mouth 3 (three) times daily as needed (muscle spasms) (Patient not taking: Reported on 07/19/2022)     budesonide-formoteroL (SYMBICORT) 160-4.5 mcg/actuation inhaler Inhale 2 inhalations into the lungs 2 (two) times daily (Patient not taking: Reported on 02/15/2023)     budesonide-glycopyrrolate-formoterol (BREZTRI AEROSPHERE) 160-9-4.8 mcg/actuation inhaler Inhale 2 inhalations into the lungs 2 (two) times daily (Patient not taking: Reported on 02/15/2023)     levETIRAcetam (KEPPRA) 750 MG tablet Take 1 tablet (750 mg total) by mouth 2 (two) times daily for 90 days 180 tablet 3   mirtazapine (REMERON) 15 MG tablet Take 15 mg by mouth at bedtime (Patient not taking: Reported on 07/19/2022)     naproxen (NAPROSYN) 500 MG tablet Take 500 mg by mouth 2 (two) times  daily with meals (Patient not taking: Reported on 07/19/2022)     nicotine (NICODERM CQ) 21 mg/24 hr patch Place 1 patch onto the skin daily (Patient not taking: Reported on 02/15/2023)     pantoprazole (PROTONIX) 40 MG DR tablet Take 40 mg by mouth once daily (Patient not taking: Reported on 02/15/2023)     tiotropium (SPIRIVA WITH HANDIHALER) 18 mcg inhalation capsule Place 18 mcg into inhaler and inhale once daily     No current facility-administered medications for this visit.    ALLERGIES: Acetaminophen and Hydrocodone-acetaminophen  PAST MEDICAL HISTORY: No past medical history on file.  PAST SURGICAL HISTORY: No past surgical history on file.   FAMILY HISTORY: No family  history on file.   SOCIAL HISTORY: Social History   Socioeconomic History   Marital status: Widowed  Tobacco Use   Smoking status: Every Day    Current packs/day: 0.50    Types: Cigarettes   Smokeless tobacco: Never  Substance and Sexual Activity   Alcohol use: Never   Drug use: Never   Social Determinants of Health   Financial Resource Strain: Medium Risk (01/05/2022)   Received from [facility], [facility]   Overall Financial Resource Strain (CARDIA)    Difficulty of Paying Living Expenses: Somewhat hard  Food Insecurity: No Food Insecurity (01/05/2022)   Received from [facility], [facility]   Hunger Vital Sign    Worried About Running Out of Food in the Last Year: Never true    Ran Out of Food in the Last Year: Never true  Transportation Needs: No Transportation Needs (01/05/2022)   Received from [facility], [facility]   [facility] - Transportation    Lack of Transportation (Medical): No    Lack of Transportation (Non-Medical): No    PHYSICAL EXAM: Vitals:   02/15/23 1503  BP: 96/64  Pulse: 73   Body mass index is 17.28 kg/m. Weight: 53.1 kg (117 lb)   GENERAL: Alert, active, oriented x3  HEENT: Pupils equal reactive to light. Extraocular movements are  intact. Sclera clear. Palpebral conjunctiva normal red color.Pharynx clear.  NECK: Supple with no palpable mass and no adenopathy.  LUNGS: Sound clear with no rales rhonchi or wheezes.  HEART: Regular rhythm S1 and S2 without murmur.  ABDOMEN: Soft and depressible, nontender with no palpable mass, no hepatomegaly.   BACK: There is a 4 x 2 x 6 cm mass in the left upper back.  Mass is attached to the skin.  There is protrusion of the mass.  No bleeding.  EXTREMITIES: Well-developed well-nourished symmetrical with no dependent edema.  NEUROLOGICAL: Awake alert oriented, facial expression symmetrical, moving all extremities.  REVIEW OF DATA: I have reviewed the following data today: No visits with results within 3 Month(s) from this visit.  Latest known visit with results is:  Initial consult on 07/19/2022  Component Date Value   Thyroid Stimulating Horm* 07/19/2022 2.276    Vitamin B12 07/19/2022 283 (L)    Vitamin D, 25-Hydroxy - * 07/19/2022 22.1 (L)    Vitamin B6 - [facility] 07/19/2022 2.8 (L)    Magnesium 07/19/2022 1.9    Phosphorus 07/19/2022 4.9      ASSESSMENT: Mr. Montefusco is a 67 y.o. male presenting for consultation for squamous cell carcinoma of the left upper back.  Patient with a huge mass in the left upper back.  Disease positive for squamous of carcinoma.  Unable to be removed locally by dermatology.  Patient needs wider excision.  Due to the size of the mass I discussed with patient that I will need to take him to the operating room for excision of the mass.  I discussed with patient that there is a possibility that I will not be able to completely close the skin.  This will need to heal by secondary intention versus negative pressure dressing.  Will make that decision at the moment of the excision.  Patient endorses she understood the risk and agreed to proceed with excision of the mass.  Squamous cell carcinoma of skin [C44.92]  PLAN: Excision of back squamous cell  carcinoma ([address]109) Avoid aspirin of blood thinner 5 days before surgery Contact us if you have  any question   Patient verbalized understanding, all questions were answered, and were agreeable with the plan outlined above.     Carolan Shiver, MD

## 2023-02-18 ENCOUNTER — Other Ambulatory Visit: Payer: Self-pay

## 2023-02-18 ENCOUNTER — Encounter
Admission: RE | Admit: 2023-02-18 | Discharge: 2023-02-18 | Disposition: A | Discharge status: Home / Self Care | Payer: Medicare Other | Source: Ambulatory Visit | Attending: General Surgery | Admitting: General Surgery

## 2023-02-18 VITALS — Ht 69.0 in | Wt 117.0 lb

## 2023-02-18 DIAGNOSIS — I10 Essential (primary) hypertension: Secondary | ICD-10-CM

## 2023-02-18 HISTORY — DX: Bursopathy, unspecified: M71.9

## 2023-02-18 HISTORY — DX: Tobacco use: Z72.0

## 2023-02-18 HISTORY — DX: Chest pain, unspecified: R07.9

## 2023-02-18 HISTORY — DX: Insomnia, unspecified: G47.00

## 2023-02-18 HISTORY — DX: Other chronic pain: G89.29

## 2023-02-18 HISTORY — DX: Long term (current) use of opiate analgesic: Z79.891

## 2023-02-18 HISTORY — DX: Centrilobular emphysema: J43.2

## 2023-02-18 HISTORY — DX: Gastro-esophageal reflux disease without esophagitis: K21.9

## 2023-02-18 HISTORY — DX: Unspecified severe protein-calorie malnutrition: E43

## 2023-02-18 HISTORY — DX: Anemia, unspecified: D64.9

## 2023-02-18 HISTORY — DX: Other nonspecific abnormal finding of lung field: R91.8

## 2023-02-18 HISTORY — DX: Adjustment disorder, unspecified: F43.20

## 2023-02-18 HISTORY — DX: Chronic respiratory failure with hypoxia: J96.11

## 2023-02-18 NOTE — Patient Instructions (Addendum)
Your procedure is scheduled [id number] February 21, 2023.  Report to the Registration Desk on the 1st floor of the [facility]. To find out your arrival time, please call [phone] between 1PM - 3PM on: Friday February 18, 2023. If your arrival time is 6:00 am, do not arrive before that time as the [facility] entrance doors do not open until 6:00 am.  REMEMBER: Instructions that are not followed completely may result in serious medical risk, up to and including death; or upon the discretion of your surgeon and anesthesiologist your surgery may need to be rescheduled.  Do not eat food or drink fluids after midnight the night before surgery.  No gum chewing or hard candies.   One week prior to surgery: Stop Anti-inflammatories (NSAIDS) such as Advil, Aleve, Ibuprofen, Motrin, Naproxen, Naprosyn and Aspirin based products such as Excedrin, Goody's Powder, BC Powder. Stop ANY OVER THE COUNTER supplements until after surgery. You may however, continue to take Tylenol if needed for pain up until the day of surgery.  Continue taking all prescribed medications with the exception of the following:   Follow recommendations from Cardiologist or PCP regarding stopping blood thinners.  TAKE ONLY THESE MEDICATIONS THE MORNING OF SURGERY WITH A SIP OF WATER:  levETIRAcetam (KEPPRA) 750 MG  metoprolol tartrate (LOPRESSOR) 25 MG  pantoprazole (PROTONIX) 40 MG Antacid (take one the night before and one on the morning of surgery - helps to prevent nausea after surgery.)  Use inhalers on the day of surgery and bring to the hospital.  York Spaniel the inhalers do not work anymore, advised need to get refills.   No Alcohol for 24 hours before or after surgery.  No Smoking including e-cigarettes for 24 hours before surgery.  No chewable tobacco products for at least 6 hours before surgery.  No nicotine patches on the day of surgery.  Do not use any "recreational" drugs for at least a week (preferably 2 weeks)  before your surgery.  Please be advised that the combination of cocaine and anesthesia may have negative outcomes, up to and including death. If you test positive for cocaine, your surgery will be cancelled.  On the morning of surgery brush your teeth with toothpaste and water, you may rinse your mouth with mouthwash if you wish. Do not swallow any toothpaste or mouthwash.  Use CHG Soap or wipes as directed on instruction sheet.  Do not wear jewelry, make-up, hairpins, clips or nail polish.  Do not wear lotions, powders, or perfumes.   Do not shave body hair from the neck down 48 hours before surgery.  Contact lenses, hearing aids and dentures may not be worn into surgery.  Do not bring valuables to the hospital. [facility] is not responsible for any missing/lost belongings or valuables.   Notify your doctor if there is any change in your medical condition (cold, fever, infection).  Wear comfortable clothing (specific to your surgery type) to the hospital.  After surgery, you can help prevent lung complications by doing breathing exercises.  Take deep breaths and cough every 1-2 hours. Your doctor may order a device called an Incentive Spirometer to help you take deep breaths. When coughing or sneezing, hold a pillow firmly against your incision with both hands. This is called "splinting." Doing this helps protect your incision. It also decreases belly discomfort.  If you are being admitted to the hospital overnight, leave your suitcase in the car. After surgery it may be brought to your room.  In case  of increased patient census, it may be necessary for you, the patient, to continue your postoperative care in the Same Day Surgery department.  If you are being discharged the day of surgery, you will not be allowed to drive home. You will need a responsible individual to drive you home and stay with you for 24 hours after surgery.   If you are taking public transportation, you will  need to have a responsible individual with you.  Please call the Pre-admissions Testing Dept. at [phone] if you have any questions about these instructions.  Surgery Visitation Policy:  Patients having surgery or a procedure may have two visitors.  Children under the age of 45 must have an adult with them who is not the patient.  Inpatient Visitation:    Visiting hours are 7 a.m. to 8 p.m. Up to four visitors are allowed at one time in a patient room. The visitors may rotate out with other people during the day.  One visitor age 49 or older may stay with the patient overnight and must be in the room by 8 p.m.    Preparing for Surgery with CHLORHEXIDINE GLUCONATE (CHG) Soap  Chlorhexidine Gluconate (CHG) Soap  o An antiseptic cleaner that kills germs and bonds with the skin to continue killing germs even after washing  o Used for showering the night before surgery and morning of surgery  Before surgery, you can play an important role by reducing the number of germs on your skin.  CHG (Chlorhexidine gluconate) soap is an antiseptic cleanser which kills germs and bonds with the skin to continue killing germs even after washing.  Please do not use if you have an allergy to CHG or antibacterial soaps. If your skin becomes reddened/irritated stop using the CHG.  1. Shower the NIGHT BEFORE SURGERY and the MORNING OF SURGERY with CHG soap.  2. If you choose to wash your hair, wash your hair first as usual with your normal shampoo.  3. After shampooing, rinse your hair and body thoroughly to remove the shampoo.  4. Use CHG as you would any other liquid soap. You can apply CHG directly to the skin and wash gently with a scrungie or a clean washcloth.  5. Apply the CHG soap to your body only from the neck down. Do not use on open wounds or open sores. Avoid contact with your eyes, ears, mouth, and genitals (private parts). Wash face and genitals (private parts) with your normal  soap.  6. Wash thoroughly, paying special attention to the area where your surgery will be performed.  7. Thoroughly rinse your body with warm water.  8. Do not shower/wash with your normal soap after using and rinsing off the CHG soap.  9. Pat yourself dry with a clean towel.  10. Wear clean pajamas to bed the night before surgery.  12. Place clean sheets on your bed the night of your first shower and do not sleep with pets.  13. Shower again with the CHG soap on the day of surgery prior to arriving at the hospital.  14. Do not apply any deodorants/lotions/powders.  15. Please wear clean clothes to the hospital.

## 2023-02-21 ENCOUNTER — Ambulatory Visit: Payer: Medicare Other | Admitting: Certified Registered"

## 2023-02-21 ENCOUNTER — Encounter: Payer: Self-pay | Admitting: General Surgery

## 2023-02-21 ENCOUNTER — Encounter
Admission: RE | Disposition: A | Discharge status: Home / Self Care | Payer: Self-pay | Source: Home / Self Care | Attending: General Surgery

## 2023-02-21 ENCOUNTER — Ambulatory Visit
Admission: RE | Admit: 2023-02-21 | Discharge: 2023-02-21 | Disposition: A | Discharge status: Home / Self Care | Payer: Medicare Other | Attending: General Surgery | Admitting: General Surgery

## 2023-02-21 ENCOUNTER — Other Ambulatory Visit: Payer: Self-pay

## 2023-02-21 DIAGNOSIS — Z79899 Other long term (current) drug therapy: Secondary | ICD-10-CM | POA: Diagnosis not present

## 2023-02-21 DIAGNOSIS — M1990 Unspecified osteoarthritis, unspecified site: Secondary | ICD-10-CM | POA: Diagnosis not present

## 2023-02-21 DIAGNOSIS — K219 Gastro-esophageal reflux disease without esophagitis: Secondary | ICD-10-CM | POA: Insufficient documentation

## 2023-02-21 DIAGNOSIS — C44529 Squamous cell carcinoma of skin of other part of trunk: Secondary | ICD-10-CM | POA: Diagnosis present

## 2023-02-21 DIAGNOSIS — Z7951 Long term (current) use of inhaled steroids: Secondary | ICD-10-CM | POA: Diagnosis not present

## 2023-02-21 DIAGNOSIS — F17210 Nicotine dependence, cigarettes, uncomplicated: Secondary | ICD-10-CM | POA: Diagnosis not present

## 2023-02-21 DIAGNOSIS — Z79891 Long term (current) use of opiate analgesic: Secondary | ICD-10-CM | POA: Diagnosis not present

## 2023-02-21 DIAGNOSIS — Z85820 Personal history of malignant melanoma of skin: Secondary | ICD-10-CM | POA: Insufficient documentation

## 2023-02-21 DIAGNOSIS — G894 Chronic pain syndrome: Secondary | ICD-10-CM | POA: Diagnosis not present

## 2023-02-21 DIAGNOSIS — Z01810 Encounter for preprocedural cardiovascular examination: Secondary | ICD-10-CM | POA: Diagnosis not present

## 2023-02-21 DIAGNOSIS — I10 Essential (primary) hypertension: Secondary | ICD-10-CM | POA: Diagnosis not present

## 2023-02-21 DIAGNOSIS — J432 Centrilobular emphysema: Secondary | ICD-10-CM | POA: Diagnosis not present

## 2023-02-21 DIAGNOSIS — Z9981 Dependence on supplemental oxygen: Secondary | ICD-10-CM | POA: Diagnosis not present

## 2023-02-21 DIAGNOSIS — Z85118 Personal history of other malignant neoplasm of bronchus and lung: Secondary | ICD-10-CM | POA: Diagnosis not present

## 2023-02-21 DIAGNOSIS — I4891 Unspecified atrial fibrillation: Secondary | ICD-10-CM | POA: Diagnosis not present

## 2023-02-21 HISTORY — PX: MASS EXCISION: SHX2000

## 2023-02-21 LAB — BASIC METABOLIC PANEL
Anion gap: 7 (ref 5–15)
BUN: 11 mg/dL (ref 8–23)
CO2: 26 mmol/L (ref 22–32)
Calcium: 8.8 mg/dL — ABNORMAL LOW (ref 8.9–10.3)
Chloride: 104 mmol/L (ref 98–111)
Creatinine, Ser: 0.85 mg/dL (ref 0.61–1.24)
GFR, Estimated: >60 mL/min (ref >60)
Glucose, Bld: 88 mg/dL (ref 70–99)
Potassium: 3.7 mmol/L (ref 3.5–5.1)
Sodium: 137 mmol/L (ref 135–145)

## 2023-02-21 LAB — CBC
HCT: 35.7 % — ABNORMAL LOW (ref 39.0–52.0)
Hemoglobin: 12 g/dL — ABNORMAL LOW (ref 13.0–17.0)
MCH: 30.2 pg (ref 26.0–34.0)
MCHC: 33.6 g/dL (ref 30.0–36.0)
MCV: 89.7 fL (ref 80.0–100.0)
Platelets: 210 10*3/uL (ref 150–400)
RBC: 3.98 MIL/uL — ABNORMAL LOW (ref 4.22–5.81)
RDW: 13.2 % (ref 11.5–15.5)
WBC: 4.8 10*3/uL (ref 4.0–10.5)
nRBC: 0 % (ref 0.0–0.2)

## 2023-02-21 SURGERY — EXCISION MASS
Anesthesia: Monitor Anesthesia Care | Site: Back | Wound class: Clean

## 2023-02-21 MED ORDER — LACTATED RINGERS IV SOLN: INTRAVENOUS | Status: DC

## 2023-02-21 MED ORDER — FENTANYL CITRATE (PF) 100 MCG/2ML IJ SOLN
INTRAMUSCULAR | Status: DC | PRN
  Administered 2023-02-21 (×2): 25 ug via INTRAVENOUS

## 2023-02-21 MED ORDER — CEFAZOLIN SODIUM-DEXTROSE 2-4 GM/100ML-% IV SOLN
2.0000 g | INTRAVENOUS | Status: AC
  Administered 2023-02-21: 2 g via INTRAVENOUS

## 2023-02-21 MED ORDER — FENTANYL CITRATE (PF) 100 MCG/2ML IJ SOLN
INTRAMUSCULAR | Status: AC
  Filled 2023-02-21: qty 2

## 2023-02-21 MED ORDER — MIDAZOLAM HCL 2 MG/2ML IJ SOLN
INTRAMUSCULAR | Status: DC | PRN
  Administered 2023-02-21: 2 mg via INTRAVENOUS

## 2023-02-21 MED ORDER — PROPOFOL 10 MG/ML IV BOLUS
INTRAVENOUS | Status: DC | PRN
  Administered 2023-02-21: 75 ug/kg/min via INTRAVENOUS

## 2023-02-21 MED ORDER — BUPIVACAINE-EPINEPHRINE 0.25% -1:200000 IJ SOLN
INTRAMUSCULAR | Status: DC | PRN
  Administered 2023-02-21: 30 mL

## 2023-02-21 MED ORDER — PROPOFOL 1000 MG/100ML IV EMUL
INTRAVENOUS | Status: AC
  Filled 2023-02-21: qty 100

## 2023-02-21 MED ORDER — MIDAZOLAM HCL 2 MG/2ML IJ SOLN
INTRAMUSCULAR | Status: AC
  Filled 2023-02-21: qty 2

## 2023-02-21 MED ORDER — KETAMINE HCL 50 MG/5ML IJ SOSY
PREFILLED_SYRINGE | INTRAMUSCULAR | Status: AC
  Filled 2023-02-21: qty 5

## 2023-02-21 MED ORDER — FENTANYL CITRATE (PF) 100 MCG/2ML IJ SOLN: 25.0000 ug | INTRAMUSCULAR | Status: DC | PRN

## 2023-02-21 MED ORDER — TRAMADOL HCL 50 MG PO TABS: 50.0000 mg | ORAL_TABLET | Freq: Four times a day (QID) | ORAL | 0 refills | Status: DC | PRN

## 2023-02-21 MED ORDER — CHLORHEXIDINE GLUCONATE 0.12 % MT SOLN
OROMUCOSAL | Status: AC
  Filled 2023-02-21: qty 15

## 2023-02-21 MED ORDER — ONDANSETRON HCL 4 MG/2ML IJ SOLN
INTRAMUSCULAR | Status: DC | PRN
  Administered 2023-02-21: 4 mg via INTRAVENOUS

## 2023-02-21 MED ORDER — DEXAMETHASONE SODIUM PHOSPHATE 4 MG/ML IJ SOLN
INTRAMUSCULAR | Status: DC | PRN
  Administered 2023-02-21: 10 mg via INTRAVENOUS

## 2023-02-21 MED ORDER — ORAL CARE MOUTH RINSE: 15.0000 mL | Freq: Once | OROMUCOSAL | Status: AC

## 2023-02-21 MED ORDER — BUPIVACAINE-EPINEPHRINE (PF) 0.25% -1:200000 IJ SOLN
INTRAMUSCULAR | Status: AC
  Filled 2023-02-21: qty 30

## 2023-02-21 MED ORDER — CEFAZOLIN SODIUM-DEXTROSE 2-4 GM/100ML-% IV SOLN
INTRAVENOUS | Status: AC
  Filled 2023-02-21: qty 100

## 2023-02-21 MED ORDER — CHLORHEXIDINE GLUCONATE 0.12 % MT SOLN
15.0000 mL | Freq: Once | OROMUCOSAL | Status: AC
  Administered 2023-02-21: 15 mL via OROMUCOSAL

## 2023-02-21 MED ORDER — EPHEDRINE SULFATE (PRESSORS) 50 MG/ML IJ SOLN
INTRAMUSCULAR | Status: DC | PRN
  Administered 2023-02-21: 5 mg via INTRAVENOUS
  Administered 2023-02-21: 2.5 mg via INTRAVENOUS

## 2023-02-21 SURGICAL SUPPLY — 30 items
ADH SKN CLS APL DERMABOND .7 (GAUZE/BANDAGES/DRESSINGS) ×1
APL PRP STRL LF DISP 70% ISPRP (MISCELLANEOUS) ×1
CHLORAPREP W/TINT 26 (MISCELLANEOUS) ×1 IMPLANT
DERMABOND ADVANCED .7 DNX12 (GAUZE/BANDAGES/DRESSINGS) ×1 IMPLANT
DRAPE LAPAROTOMY 100X77 ABD (DRAPES) ×1 IMPLANT
ELECT CAUTERY BLADE 6.4 (BLADE) ×1 IMPLANT
ELECT REM PT RETURN 9FT ADLT (ELECTROSURGICAL) ×1
ELECTRODE REM PT RTRN 9FT ADLT (ELECTROSURGICAL) ×1 IMPLANT
GAUZE SPONGE 4X4 12PLY STRL (GAUZE/BANDAGES/DRESSINGS) IMPLANT
GLOVE BIO SURGEON STRL SZ 6.5 (GLOVE) ×1 IMPLANT
GLOVE BIOGEL PI IND STRL 6.5 (GLOVE) ×1 IMPLANT
GOWN STRL REUS W/ TWL LRG LVL3 (GOWN DISPOSABLE) ×2 IMPLANT
GOWN STRL REUS W/TWL LRG LVL3 (GOWN DISPOSABLE) ×2
KIT TURNOVER KIT A (KITS) ×1 IMPLANT
LABEL OR SOLS (LABEL) ×1 IMPLANT
MANIFOLD NEPTUNE II (INSTRUMENTS) ×1 IMPLANT
NDL HYPO 25X1 1.5 SAFETY (NEEDLE) ×1 IMPLANT
NEEDLE HYPO 25X1 1.5 SAFETY (NEEDLE) ×1 IMPLANT
NS IRRIG 500ML POUR BTL (IV SOLUTION) ×1 IMPLANT
PACK BASIN MINOR ARMC (MISCELLANEOUS) ×1 IMPLANT
SUT ETHILON 3-0 (SUTURE) IMPLANT
SUT MNCRL 4-0 (SUTURE) ×1
SUT MNCRL 4-0 27XMFL (SUTURE) ×1
SUT PROLENE 2 0 SH DA (SUTURE) IMPLANT
SUT VIC AB 3-0 SH 27 (SUTURE) ×1
SUT VIC AB 3-0 SH 27X BRD (SUTURE) ×1 IMPLANT
SUTURE MNCRL 4-0 27XMF (SUTURE) ×1 IMPLANT
SYR 10ML LL (SYRINGE) ×1 IMPLANT
TRAP FLUID SMOKE EVACUATOR (MISCELLANEOUS) ×1 IMPLANT
WATER STERILE IRR 500ML POUR (IV SOLUTION) ×1 IMPLANT

## 2023-02-21 NOTE — Anesthesia Procedure Notes (Signed)
Date/Time: 02/21/2023 11:30 AM  Performed by: Maryla Morrow., CRNAPre-anesthesia Checklist: Patient identified, Emergency Drugs available, Suction available, Patient being monitored and Timeout performed Patient Re-evaluated:Patient Re-evaluated prior to induction Oxygen Delivery Method: Simple face mask Preoxygenation: Pre-oxygenation with 100% oxygen Induction Type: IV induction

## 2023-02-21 NOTE — Discharge Instructions (Addendum)
  Diet: Resume home heart healthy regular diet.   Activity: No heavy lifting >20 pounds (children, pets, laundry, garbage) or strenuous activity until follow-up, but light activity and walking are encouraged. Do not drive or drink alcohol if taking narcotic pain medications.  Wound care: Remove dressing tomorrow. Once dressing removed, may shower with soapy water and pat dry (do not rub incisions), but no baths or submerging incision underwater until follow-up. (no swimming)   Medications: Resume all home medications. For mild to moderate pain: acetaminophen (Tylenol) or ibuprofen (if no kidney disease). Combining Tylenol with alcohol can substantially increase your risk of causing liver disease. Narcotic pain medications, if prescribed, can be used for severe pain, though may cause nausea, constipation, and drowsiness. Do not combine Tylenol and Norco within a 6 hour period as Norco contains Tylenol. If you do not need the narcotic pain medication, you do not need to fill the prescription.  Call office [phone]) at any time if any questions, worsening pain, fevers/chills, bleeding, drainage from incision site, or other concerns.  AMBULATORY SURGERY  DISCHARGE INSTRUCTIONS   The drugs that you were given will stay in your system until tomorrow so for the next 24 hours you should not:  Drive an automobile Make any legal decisions Drink any alcoholic beverage   You may resume regular meals tomorrow.  Today it is better to start with liquids and gradually work up to solid foods.  You may eat anything you prefer, but it is better to start with liquids, then soup and crackers, and gradually work up to solid foods.   Please notify your doctor immediately if you have any unusual bleeding, trouble breathing, redness and pain at the surgery site, drainage, fever, or pain not relieved by medication.    Additional Instructions:        Please contact your physician with any problems or  Same Day Surgery at [phone], Monday through Friday 6 am to 4 pm, or [facility] number at [phone].

## 2023-02-21 NOTE — Transfer of Care (Signed)
Immediate Anesthesia Transfer of Care Note  Patient: David [name]  Procedure(s) Performed: EXCISION MASS (Back)  Patient Location: PACU  Anesthesia Type:General  Level of Consciousness: drowsy and patient cooperative  Airway & Oxygen Therapy: Patient Spontanous Breathing  Post-op Assessment: Report given to RN and Post -op Vital signs reviewed and stable  Post vital signs: stable  Last Vitals:  Vitals Value Taken Time  BP 95/56 02/21/23 1221  Temp    Pulse 64 02/21/23 1222  Resp 15 02/21/23 1222  SpO2 100 % 02/21/23 1222  Vitals shown include unfiled device data.  Last Pain:  Vitals:   02/21/23 1108  TempSrc: Oral         Complications: No notable events documented.

## 2023-02-21 NOTE — Anesthesia Preprocedure Evaluation (Signed)
Anesthesia Evaluation  Patient identified by MRN, date of birth, ID band Patient awake    Reviewed: Allergy & Precautions, NPO status , Patient's Chart, lab work & pertinent test results, reviewed documented beta blocker date and time   History of Anesthesia Complications Negative for: history of anesthetic complications  Airway Mallampati: II  TM Distance: >3 FB     Dental  (+) Upper Dentures, Lower Dentures, Dental Advidsory Given   Pulmonary neg shortness of breath, asthma , COPD,  COPD inhaler, neg recent URI, Current Smoker and Patient abstained from smoking.    + decreased breath sounds      Cardiovascular Exercise Tolerance: Good hypertension, Pt. on medications (-) angina (-) Past MI and (-) Cardiac Stents (-) dysrhythmias (-) Valvular Problems/Murmurs     Neuro/Psych neg Seizures PSYCHIATRIC DISORDERS Anxiety     CVA, No Residual Symptoms    GI/Hepatic Neg liver ROS,GERD  ,,  Endo/Other  negative endocrine ROS    Renal/GU negative Renal ROS     Musculoskeletal  (+) Arthritis ,    Abdominal   Peds  Hematology  (+) Blood dyscrasia, anemia   Anesthesia Other Findings Gout. Hx of melanoma. Normal echo with ef 50-60. Hb 10. Hx of drug abuse. On O2 at nite.  Reproductive/Obstetrics                             Anesthesia Physical Anesthesia Plan  ASA: 3  Anesthesia Plan: MAC   Post-op Pain Management:    Induction: Intravenous  PONV Risk Score and Plan: 0 and Midazolam  Airway Management Planned: Oral ETT  Additional Equipment:   Intra-op Plan:   Post-operative Plan:   Informed Consent: I have reviewed the patients History and Physical, chart, labs and discussed the procedure including the risks, benefits and alternatives for the proposed anesthesia with the patient or authorized representative who has indicated his/her understanding and acceptance.       Plan Discussed  with: CRNA  Anesthesia Plan Comments:         Anesthesia Quick Evaluation

## 2023-02-21 NOTE — Interval H&P Note (Signed)
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  The various methods of treatment have been discussed with the patient and family. After consideration of risks, benefits and other options for treatment, the patient has consented to  Procedure(s): EXCISION MASS (N/A) as a surgical intervention.  The patient's history has been reviewed, patient examined, no change in status, stable for surgery.  I have reviewed the patient's chart and labs.  Questions were answered to the patient's satisfaction.     Carolan Shiver

## 2023-02-21 NOTE — Op Note (Signed)
OPERATION REPORT  Pre Operative Diagnosis: Left upper back squamous cell carcinoma  Post operative diagnosis: Same  Anesthesia: MAC and Local   Surgeon: Dr. Hazle Quant   Indication: This 67 y.o. year old male with a large back mass positive for squamous cell carcinoma.    Description of procedure: after orienting patient about the procedure steps and benefits and patient agreed to proceed. Time out was done identifying correct patient and location of procedure. After induction of monitored sedation, patient on prone position, local anesthesia was infiltrated around the palpable lesion. With a blade #15, a wide elliptical incision was made using the skin lines. Sharp dissection was carried down and lesion was excised including dermal tissue and deep muscular fascia. The mass measured 6 cm. Deep dermal stitches were done with vicryl 4-0 to repair the laceration and skin closed with interrupted 2-0 Prolene. Specimen sent to pathology.    Complications: none   EBL: minimal  Carolan Shiver, MD, FACS

## 2023-02-22 ENCOUNTER — Encounter: Payer: Self-pay | Admitting: General Surgery

## 2023-03-02 NOTE — Anesthesia Postprocedure Evaluation (Signed)
Anesthesia Post Note  Patient: David [name]  Procedure(s) Performed: EXCISION MASS (Back)  Patient location during evaluation: PACU Anesthesia Type: MAC Level of consciousness: awake and alert Pain management: pain level controlled Vital Signs Assessment: post-procedure vital signs reviewed and stable Respiratory status: spontaneous breathing, nonlabored ventilation, respiratory function stable and patient connected to nasal cannula oxygen Cardiovascular status: blood pressure returned to baseline and stable Postop Assessment: no apparent nausea or vomiting Anesthetic complications: no   There were no known notable events for this encounter.   Last Vitals:  Vitals:   02/21/23 1300 02/21/23 1318  BP: (!) 135/58 90/79  Pulse: (!) 55 (!) 56  Resp: 16 16  Temp: (!) 36.1 C (!) 36.1 C  SpO2: 95% 100%    Last Pain:  Vitals:   02/21/23 1318  TempSrc: Temporal  PainSc: 0-No pain                 Lenard Simmer

## 2023-04-08 ENCOUNTER — Ambulatory Visit: Payer: Self-pay

## 2023-04-08 NOTE — Telephone Encounter (Signed)
  Chief Complaint: Call from NP regarding pt. Symptoms: see note Frequency: today Pertinent Negatives: Patient denies  Disposition: [] ED /[] Urgent Care (no appt availability in office) / [] Appointment(In office/virtual)/ []  [facility]/ [] Home Care/ [] Refused Recommended Disposition /[] [facility]/ [x]  Follow-up with PCP Additional Notes: Received call From Yvonna Alanis, NP out to pt's home for insurance home assessment. She had many issues that she felt needed addressing: Pt is 105 lbs. Pt has a malnourished score on 11. She discussed ensure with pt. Pt appears to have poor hygiene. She states he was not wearing a shirt when she arrived. His hands were dirty. Pt told her he does not shower very often.  Pt is on BP medications. BP today was 90/58. Pt had 2 strokes, but is not on any blood thinners.  Pt was on O2. Pt lost his O2 tank and does not know where it is. It may have gotten misplaced in the move to his brother's home.  Pt has a wound on his back from where dermatology removed a growth. This has not healed and pt still has 5 stitches in his back. This removal took place in July. Pt states he is on seizure medication, but states that he has only had 1 seizure after his strokes.  Pt is out of his inhaler medication. Pt has tramodol which he uses as a sleep aid.  NP also states that pt does not have his metoprolol.  He does have protonix for acid reflux. Please advise.    Reason for Disposition  [1] Follow-up call from patient regarding patient's clinical status AND [2] information urgent  Answer Assessment - Initial Assessment Questions 1. REASON FOR CALL or QUESTION: "What is your reason for calling today?" or "How can I best help you?" or "What question do you have that I can help answer?"     Call from [facility] NP  2. CALLER: Document the source of call. (e.g., laboratory, patient).     NP from insurance company out for an assessment.  Protocols used: PCP Call  - No Triage-A-AH

## 2023-04-12 ENCOUNTER — Telehealth: Payer: Self-pay

## 2023-04-12 NOTE — Telephone Encounter (Signed)
LMTCB 04/12/2023.  PEC please advise pt and schedule an office.    Thanks,   -Vernona Rieger

## 2023-04-12 NOTE — Telephone Encounter (Signed)
Please contact patient and schedule a follow-up apt with me here so we can discuss all of these concerns and connect him to the appropriate referrals.  He will ultimately need assistance with Chronic Care Management.  Routing chart to [place] RN and [employer].  He was previously on hospice and then discharged to palliative care, but since that program has changed they are no longer active.  Saralyn Pilar, DO [facility] 04/12/2023, 1:10 PM

## 2023-04-12 NOTE — Progress Notes (Signed)
  Care Management   Outreach Note  04/12/2023 Name: David [name] MRN: [id number] DOB: 1956/06/21  An unsuccessful telephone outreach was attempted today to contact the patient about Care Management needs.    Follow Up Plan:  A HIPAA compliant phone message was left for the patient providing contact information and requesting a return call.  If patient returns call to provider office, please advise to call Embedded Care Management Care Guide Penne Lash * at [phone] Lash, RMA Care Guide [facility]  [place], [place] [address] Direct Dial: [phone] [email or web address]

## 2023-04-15 NOTE — Telephone Encounter (Signed)
Tried calling; no answer.  PEC please schedule pt if he calls back.   Thanks,   -Vernona Rieger

## 2023-04-19 NOTE — Progress Notes (Signed)
  Care Management   Note  04/19/2023 Name: RYER KERWICK MRN: [id number] DOB: 02/07/1956  NAIIM MACEDO is a 67 y.o. year old male who is a primary care patient of Smitty Cords, DO. I reached out to [name] by phone today offer care management services.   Mr. Mastrogiovanni was given information about care management services today including:  Care management services include personalized support from designated clinical staff, including individualized plan of care and coordination with other care providers 24/7 contact phone numbers for assistance for urgent and routine care needs. The patient may stop care management services at any time by phone call to the office staff.  Patient agreed to services and verbal consent obtained.   Follow up plan: Telephone appointment with care management team member scheduled for:04/20/2023  Penne Lash, RMA Care Guide [facility]  [place], [place] [address] Direct Dial: [phone] [email or web address]

## 2023-04-20 ENCOUNTER — Other Ambulatory Visit: Payer: 59

## 2023-04-20 ENCOUNTER — Other Ambulatory Visit: Payer: Self-pay

## 2023-04-20 NOTE — Patient Instructions (Signed)
Visit Information  Thank you for taking time to visit with me today. Please don't hesitate to contact me if I can be of assistance to you before our next scheduled telephone appointment.  Following are the goals we discussed today:   Goals Addressed             This Visit's Progress    RNCM for Chronic Disease Management for Chronic Conditions       Current Barriers:  Knowledge Deficits related to plan of care for management of HTN, COPD, and Depression  Care Coordination needs related to Limited social support, Transportation, Housing barriers, Level of care concerns, and Literacy concerns  Chronic Disease Management support and education needs related to HTN, COPD, and depression Lacks caregiver support.        Corporate treasurer.  Literacy barriers  RNCM Clinical Goal(s):  Patient will verbalize understanding of plan for management of HTN, COPD, and Depression as evidenced by following the plan of care, taking medications as directed, receiving proper medical care take all medications exactly as prescribed and will call provider for medication related questions as evidenced by taking medications as directed     attend all scheduled medical appointments: with pcp and eye doctor as evidenced by keeping appointments        continue to work with RN Care Manager and/or Social Worker to address care management and care coordination needs related to HTN, COPD, and Depression as evidenced by adherence to CM Team Scheduled appointments     demonstrate ongoing self health care management ability to manage his health and well being, receive proper care, and work with the team to manage his care  as evidenced by     through collaboration with Medical illustrator, provider, and care team.   Interventions: Evaluation of current treatment plan related to  self management and patient's adherence to plan as established by provider   COPD: (Status: New goal.) Long Term Goal  Reviewed medications  with patient, including use of prescribed maintenance and rescue inhalers, and provided instruction on medication management and the importance of adherence Provided patient with basic written and verbal COPD education on self care/management/and exacerbation prevention Advised patient to track and manage COPD triggers Provided written and verbal instructions on pursed lip breathing and utilized returned demonstration as teach back Provided instruction about proper use of medications used for management of COPD including inhalers Advised patient to self assesses COPD action plan zone and make appointment with provider if in the yellow zone for 48 hours without improvement Advised patient to engage in light exercise as tolerated 3-5 days a week to aid in the the management of COPD Provided education about and advised patient to utilize infection prevention strategies to reduce risk of respiratory infection Discussed the importance of adequate rest and management of fatigue with COPD Screening for signs and symptoms of depression related to chronic disease state  Assessed social determinant of health barriers  Depression   (Status: New goal.) Long Term Goal  Evaluation of current treatment plan related to  Depression , Level of care concerns, ADL IADL limitations, and Mental Health Concerns  self-management and patient's adherence to plan as established by provider. Discussed plans with patient for ongoing care management follow up and provided patient with direct contact information for care management team Advised patient to call the office for changes in his mood, anxiety, stress level, and depression; Provided education to patient re: dealing with depression, he states he wants to sleep all  the time; Reviewed medications with patient and discussed compliance. The patient states he has his medications and is compliant.; Collaborated with LCSW regarding depression, housing concerns, literacy  levels, and expressed needs ; Reviewed scheduled/upcoming provider appointments including 05-04-2023 at 240 pm; Social Work referral for expressed needs: housing, literacy levels, and depression; Discussed plans with patient for ongoing care management follow up and provided patient with direct contact information for care management team; Advised patient to discuss call the office for changes in mood, anxiety, depression with provider; Screening for signs and symptoms of depression related to chronic disease state;  Assessed social determinant of health barriers;  Has stiches that need to be removed that have been in place since July of 2024   Patient Goals/Self-Care Activities: Take medications as prescribed   Attend all scheduled provider appointments Call pharmacy for medication refills 3-7 days in advance of running out of medications Call provider office for new concerns or questions  Work with the social worker to address care coordination needs and will continue to work with the clinical team to address health care and disease management related needs call the Suicide and Crisis Lifeline: 988 call the [place] National Suicide Prevention Lifeline: 4806402791 or TTY: (808) 119-6088 TTY 701-325-0097) to talk to a trained counselor call 1-800-273-TALK (toll free, 24 hour hotline) if experiencing a Mental Health or Behavioral Health Crisis  eliminate smoking in my home identify and avoid work-related triggers identify and remove indoor air pollutants listen for public air quality announcements every day develop a rescue plan eliminate symptom triggers at home follow rescue plan if symptoms flare-up develop a new routine to improve sleep           Our next appointment is by telephone on 05-24-2023 at 345 pm  Please call the care guide team at [phone] if you need to cancel or reschedule your appointment.   If you are experiencing a Mental Health or Behavioral Health Crisis  or need someone to talk to, please call the Suicide and Crisis Lifeline: 988 call the [place] National Suicide Prevention Lifeline: (908)094-8822 or TTY: (870)674-7386 TTY (218) 427-9254) to talk to a trained counselor call 1-800-273-TALK (toll free, 24 hour hotline)   The patient verbalized understanding of instructions, educational materials, and care plan provided today and DECLINED offer to receive copy of patient instructions, educational materials, and care plan.     [place] RN, MSN, CCM RN Care Manager  [facility]  Ambulatory Care Management  Direct Number: [phone]

## 2023-04-20 NOTE — Patient Outreach (Addendum)
Care Management   Visit Note  04/20/2023 Name: David [name] MRN: [id number] DOB: 1955/09/30  Subjective: David [name] is a 67 y.o. year old male who is a primary care patient of Smitty Cords, DO. The Care Management team was consulted for assistance.      Engaged with patient spoke with patient by telephone.    Goals Addressed             This Visit's Progress    RNCM for Chronic Disease Management for Chronic Conditions       Current Barriers:  Knowledge Deficits related to plan of care for management of HTN, COPD, and Depression  Care Coordination needs related to Limited social support, Transportation, Housing barriers, Level of care concerns, and Literacy concerns  Chronic Disease Management support and education needs related to HTN, COPD, and depression Lacks caregiver support.        Corporate treasurer.  Literacy barriers  RNCM Clinical Goal(s):  Patient will verbalize understanding of plan for management of HTN, COPD, and Depression as evidenced by following the plan of care, taking medications as directed, receiving proper medical care take all medications exactly as prescribed and will call provider for medication related questions as evidenced by taking medications as directed     attend all scheduled medical appointments: with pcp and eye doctor as evidenced by keeping appointments        continue to work with RN Care Manager and/or Social Worker to address care management and care coordination needs related to HTN, COPD, and Depression as evidenced by adherence to CM Team Scheduled appointments     demonstrate ongoing self health care management ability to manage his health and well being, receive proper care, and work with the team to manage his care  as evidenced by     through collaboration with Medical illustrator, provider, and care team.   Interventions: Evaluation of current treatment plan related to  self management and patient's adherence to  plan as established by provider   COPD: (Status: New goal.) Long Term Goal  Reviewed medications with patient, including use of prescribed maintenance and rescue inhalers, and provided instruction on medication management and the importance of adherence Provided patient with basic written and verbal COPD education on self care/management/and exacerbation prevention Advised patient to track and manage COPD triggers Provided written and verbal instructions on pursed lip breathing and utilized returned demonstration as teach back Provided instruction about proper use of medications used for management of COPD including inhalers Advised patient to self assesses COPD action plan zone and make appointment with provider if in the yellow zone for 48 hours without improvement Advised patient to engage in light exercise as tolerated 3-5 days a week to aid in the the management of COPD Provided education about and advised patient to utilize infection prevention strategies to reduce risk of respiratory infection Discussed the importance of adequate rest and management of fatigue with COPD Screening for signs and symptoms of depression related to chronic disease state  Assessed social determinant of health barriers  Depression   (Status: New goal.) Long Term Goal  Evaluation of current treatment plan related to  Depression , Level of care concerns, ADL IADL limitations, and Mental Health Concerns  self-management and patient's adherence to plan as established by provider. Discussed plans with patient for ongoing care management follow up and provided patient with direct contact information for care management team Advised patient to call the office for changes in his mood,  anxiety, stress level, and depression; Provided education to patient re: dealing with depression, he states he wants to sleep all the time; Reviewed medications with patient and discussed compliance. The patient states he has his  medications and is compliant.; Collaborated with LCSW regarding depression, housing concerns, literacy levels, and expressed needs ; Reviewed scheduled/upcoming provider appointments including 05-04-2023 at 240 pm; Social Work referral for expressed needs: housing, literacy levels, and depression; Discussed plans with patient for ongoing care management follow up and provided patient with direct contact information for care management team; Advised patient to discuss call the office for changes in mood, anxiety, depression with provider; Screening for signs and symptoms of depression related to chronic disease state;  Assessed social determinant of health barriers;  Has stiches that need to be removed that have been in place since July of 2024   Patient Goals/Self-Care Activities: Take medications as prescribed   Attend all scheduled provider appointments Call pharmacy for medication refills 3-7 days in advance of running out of medications Call provider office for new concerns or questions  Work with the social worker to address care coordination needs and will continue to work with the clinical team to address health care and disease management related needs call the Suicide and Crisis Lifeline: 988 call the [place] National Suicide Prevention Lifeline: 320-586-1531 or TTY: (267) 336-9192 TTY 303-461-5876) to talk to a trained counselor call 1-800-273-TALK (toll free, 24 hour hotline) if experiencing a Mental Health or Behavioral Health Crisis  eliminate smoking in my home identify and avoid work-related triggers identify and remove indoor air pollutants listen for public air quality announcements every day develop a rescue plan eliminate symptom triggers at home follow rescue plan if symptoms flare-up develop a new routine to improve sleep           Consent to Services:  Patient was given information about care management services, agreed to services, and gave verbal consent to  participate.   Plan: Telephone follow up appointment with care management team member scheduled for: 05-24-2023 at 345 pm   [place] RN, MSN, CCM RN Care Manager  [facility]  Ambulatory Care Management  Direct Number: [phone]

## 2023-04-27 ENCOUNTER — Ambulatory Visit: Payer: Self-pay | Admitting: *Deleted

## 2023-04-27 ENCOUNTER — Encounter: Payer: Self-pay | Admitting: *Deleted

## 2023-04-27 DIAGNOSIS — H2513 Age-related nuclear cataract, bilateral: Secondary | ICD-10-CM | POA: Diagnosis not present

## 2023-04-27 DIAGNOSIS — H5000 Unspecified esotropia: Secondary | ICD-10-CM | POA: Diagnosis not present

## 2023-04-27 NOTE — Patient Outreach (Signed)
Care Coordination   04/27/2023 Name: David [name] MRN: [id number] DOB: [birth year]   Care Coordination Outreach Attempts:  An unsuccessful telephone outreach was attempted for a scheduled appointment today.  Follow Up Plan:  Additional outreach attempts will be made to offer the patient care coordination information and services.   Encounter Outcome:  No Answer   Care Coordination Interventions:  No, not indicated    Annette Liotta, LCSW [place]  [facility], [facility] Licensed Clinical Social Worker Care Coordinator  Direct Dial: [phone]

## 2023-04-27 NOTE — Telephone Encounter (Signed)
This encounter was created in error - please disregard.

## 2023-05-04 ENCOUNTER — Ambulatory Visit: Payer: Medicare Other | Admitting: Family Medicine

## 2023-05-12 DIAGNOSIS — Z4802 Encounter for removal of sutures: Secondary | ICD-10-CM | POA: Diagnosis not present

## 2023-05-12 DIAGNOSIS — C4492 Squamous cell carcinoma of skin, unspecified: Secondary | ICD-10-CM | POA: Diagnosis not present

## 2023-05-24 ENCOUNTER — Other Ambulatory Visit: Payer: 59

## 2023-05-24 ENCOUNTER — Other Ambulatory Visit: Payer: Self-pay

## 2023-05-24 NOTE — Patient Instructions (Signed)
Visit Information  Thank you for taking time to visit with me today. Please don't hesitate to contact me if I can be of assistance to you before our next scheduled telephone appointment.  Following are the goals we discussed today:   Goals Addressed             This Visit's Progress    RNCM for Chronic Disease Management for Chronic Conditions       Current Barriers:  Knowledge Deficits related to plan of care for management of HTN, COPD, and Depression  Care Coordination needs related to Limited social support, Transportation, Housing barriers, Level of care concerns, and Literacy concerns  Chronic Disease Management support and education needs related to HTN, COPD, and depression Lacks caregiver support.        Corporate treasurer.  Literacy barriers  RNCM Clinical Goal(s):  Patient will verbalize understanding of plan for management of HTN, COPD, and Depression as evidenced by following the plan of care, taking medications as directed, receiving proper medical care take all medications exactly as prescribed and will call provider for medication related questions as evidenced by taking medications as directed     attend all scheduled medical appointments: with pcp and eye doctor as evidenced by keeping appointments        continue to work with RN Care Manager and/or Social Worker to address care management and care coordination needs related to HTN, COPD, and Depression as evidenced by adherence to CM Team Scheduled appointments     demonstrate ongoing self health care management ability to manage his health and well being, receive proper care, and work with the team to manage his care  as evidenced by     through collaboration with Medical illustrator, provider, and care team.   Interventions: Evaluation of current treatment plan related to  self management and patient's adherence to plan as established by provider   COPD: (Status: Goal on Track (progressing): YES.) Long Term Goal   Reviewed medications with patient, including use of prescribed maintenance and rescue inhalers, and provided instruction on medication management and the importance of adherence Provided patient with basic written and verbal COPD education on self care/management/and exacerbation prevention. The patient states he is doing well, denies any new issues concerns at this time. Was a no-show to his pcp appointment. The patient states that he was not feeling well. He states he will go when he can. Advised patient to track and manage COPD triggers Provided written and verbal instructions on pursed lip breathing and utilized returned demonstration as teach back Provided instruction about proper use of medications used for management of COPD including inhalers Advised patient to self assesses COPD action plan zone and make appointment with provider if in the yellow zone for 48 hours without improvement Advised patient to engage in light exercise as tolerated 3-5 days a week to aid in the the management of COPD Provided education about and advised patient to utilize infection prevention strategies to reduce risk of respiratory infection Discussed the importance of adequate rest and management of fatigue with COPD Screening for signs and symptoms of depression related to chronic disease state  Assessed social determinant of health barriers  Depression   (Status: New goal.) Long Term Goal  Evaluation of current treatment plan related to  Depression , Level of care concerns, ADL IADL limitations, and Mental Health Concerns  self-management and patient's adherence to plan as established by provider. Discussed plans with patient for ongoing care management follow up  and provided patient with direct contact information for care management team Advised patient to call the office for changes in his mood, anxiety, stress level, and depression; Provided education to patient re: dealing with depression, he states he  wants to sleep all the time; Reviewed medications with patient and discussed compliance. The patient states he has his medications and is compliant.; Collaborated with LCSW regarding depression, housing concerns, literacy levels, and expressed needs ; Reviewed scheduled/upcoming provider appointments including missed appointment with the pcp. The patient states he will go see him we he can.; Social Work referral for expressed needs: housing, literacy levels, and depression; Discussed plans with patient for ongoing care management follow up and provided patient with direct contact information for care management team; Advised patient to discuss call the office for changes in mood, anxiety, depression with provider; Screening for signs and symptoms of depression related to chronic disease state;  Assessed social determinant of health barriers;  Had his stitches removed recently. Denies any new issues at this time. Will let the pcp know.    Patient Goals/Self-Care Activities: Take medications as prescribed   Attend all scheduled provider appointments Call pharmacy for medication refills 3-7 days in advance of running out of medications Call provider office for new concerns or questions  Work with the social worker to address care coordination needs and will continue to work with the clinical team to address health care and disease management related needs call the Suicide and Crisis Lifeline: 988 call the [place] National Suicide Prevention Lifeline: 631-197-8494 or TTY: 929-562-3521 TTY 909-527-1499) to talk to a trained counselor call 1-800-273-TALK (toll free, 24 hour hotline) if experiencing a Mental Health or Behavioral Health Crisis  eliminate smoking in my home identify and avoid work-related triggers identify and remove indoor air pollutants listen for public air quality announcements every day develop a rescue plan eliminate symptom triggers at home follow rescue plan if symptoms  flare-up develop a new routine to improve sleep           Our next appointment is by telephone on 07-27-2023 at 345 pm  Please call the care guide team at [phone] if you need to cancel or reschedule your appointment.   If you are experiencing a Mental Health or Behavioral Health Crisis or need someone to talk to, please call the Suicide and Crisis Lifeline: 988 call the [place] National Suicide Prevention Lifeline: 709-210-9893 or TTY: 626-313-9195 TTY 409-121-9835) to talk to a trained counselor call 1-800-273-TALK (toll free, 24 hour hotline)   The patient verbalized understanding of instructions, educational materials, and care plan provided today and DECLINED offer to receive copy of patient instructions, educational materials, and care plan.     Alto Denver RN, MSN, CCM RN Care Manager  [facility]  Ambulatory Care Management  Direct Number: [phone]

## 2023-05-24 NOTE — Patient Outreach (Signed)
Care Management   Visit Note  05/24/2023 Name: David [name] MRN: [id number] DOB: 1956-03-15  Subjective: David [name] is a 67 y.o. year old male who is a primary care patient of Smitty Cords, DO. The Care Management team was consulted for assistance.      Engaged with patient spoke with patient by telephone.    Goals Addressed             This Visit's Progress    RNCM for Chronic Disease Management for Chronic Conditions       Current Barriers:  Knowledge Deficits related to plan of care for management of HTN, COPD, and Depression  Care Coordination needs related to Limited social support, Transportation, Housing barriers, Level of care concerns, and Literacy concerns  Chronic Disease Management support and education needs related to HTN, COPD, and depression Lacks caregiver support.        Corporate treasurer.  Literacy barriers  RNCM Clinical Goal(s):  Patient will verbalize understanding of plan for management of HTN, COPD, and Depression as evidenced by following the plan of care, taking medications as directed, receiving proper medical care take all medications exactly as prescribed and will call provider for medication related questions as evidenced by taking medications as directed     attend all scheduled medical appointments: with pcp and eye doctor as evidenced by keeping appointments        continue to work with RN Care Manager and/or Social Worker to address care management and care coordination needs related to HTN, COPD, and Depression as evidenced by adherence to CM Team Scheduled appointments     demonstrate ongoing self health care management ability to manage his health and well being, receive proper care, and work with the team to manage his care  as evidenced by     through collaboration with Medical illustrator, provider, and care team.   Interventions: Evaluation of current treatment plan related to  self management and patient's adherence to  plan as established by provider   COPD: (Status: Goal on Track (progressing): YES.) Long Term Goal  Reviewed medications with patient, including use of prescribed maintenance and rescue inhalers, and provided instruction on medication management and the importance of adherence Provided patient with basic written and verbal COPD education on self care/management/and exacerbation prevention. The patient states he is doing well, denies any new issues concerns at this time. Was a no-show to his pcp appointment. The patient states that he was not feeling well. He states he will go when he can. Advised patient to track and manage COPD triggers Provided written and verbal instructions on pursed lip breathing and utilized returned demonstration as teach back Provided instruction about proper use of medications used for management of COPD including inhalers Advised patient to self assesses COPD action plan zone and make appointment with provider if in the yellow zone for 48 hours without improvement Advised patient to engage in light exercise as tolerated 3-5 days a week to aid in the the management of COPD Provided education about and advised patient to utilize infection prevention strategies to reduce risk of respiratory infection Discussed the importance of adequate rest and management of fatigue with COPD Screening for signs and symptoms of depression related to chronic disease state  Assessed social determinant of health barriers  Depression   (Status: New goal.) Long Term Goal  Evaluation of current treatment plan related to  Depression , Level of care concerns, ADL IADL limitations, and Mental Health Concerns  self-management and patient's adherence to plan as established by provider. Discussed plans with patient for ongoing care management follow up and provided patient with direct contact information for care management team Advised patient to call the office for changes in his mood, anxiety,  stress level, and depression; Provided education to patient re: dealing with depression, he states he wants to sleep all the time; Reviewed medications with patient and discussed compliance. The patient states he has his medications and is compliant.; Collaborated with LCSW regarding depression, housing concerns, literacy levels, and expressed needs ; Reviewed scheduled/upcoming provider appointments including missed appointment with the pcp. The patient states he will go see him we he can.; Social Work referral for expressed needs: housing, literacy levels, and depression; Discussed plans with patient for ongoing care management follow up and provided patient with direct contact information for care management team; Advised patient to discuss call the office for changes in mood, anxiety, depression with provider; Screening for signs and symptoms of depression related to chronic disease state;  Assessed social determinant of health barriers;  Had his stitches removed recently. Denies any new issues at this time. Will let the pcp know.    Patient Goals/Self-Care Activities: Take medications as prescribed   Attend all scheduled provider appointments Call pharmacy for medication refills 3-7 days in advance of running out of medications Call provider office for new concerns or questions  Work with the social worker to address care coordination needs and will continue to work with the clinical team to address health care and disease management related needs call the Suicide and Crisis Lifeline: 988 call the [place] National Suicide Prevention Lifeline: 317-117-3349 or TTY: 918-095-8328 TTY 9790933198) to talk to a trained counselor call 1-800-273-TALK (toll free, 24 hour hotline) if experiencing a Mental Health or Behavioral Health Crisis  eliminate smoking in my home identify and avoid work-related triggers identify and remove indoor air pollutants listen for public air quality announcements  every day develop a rescue plan eliminate symptom triggers at home follow rescue plan if symptoms flare-up develop a new routine to improve sleep           Consent to Services:  Patient was given information about care management services, agreed to services, and gave verbal consent to participate.   Plan: Telephone follow up appointment with care management team member scheduled for: 07-27-2023 at 345 pm   Alto Denver RN, MSN, CCM RN Care Manager  [facility]  Ambulatory Care Management  Direct Number: [phone]

## 2023-06-10 ENCOUNTER — Ambulatory Visit (INDEPENDENT_AMBULATORY_CARE_PROVIDER_SITE_OTHER): Payer: 59

## 2023-06-10 DIAGNOSIS — Z0000 Encounter for general adult medical examination without abnormal findings: Secondary | ICD-10-CM

## 2023-06-10 NOTE — Patient Instructions (Addendum)
David [name] , Thank you for taking time to come for your Medicare Wellness Visit. I appreciate your ongoing commitment to your health goals. Please review the following plan we discussed and let me know if I can assist you in the future.   Referrals/Orders/Follow-Ups/Clinician Recommendations: none  This is a list of the screening recommended for you and due dates:  Health Maintenance  Topic Date Due   COVID-19 Vaccine (1) Never done   Pneumonia Vaccine (1 of 2 - PCV) Never done   HIV Screening  Never done   Hepatitis C Screening  Never done   DTaP/Tdap/Td vaccine (1 - Tdap) Never done   Zoster (Shingles) Vaccine (1 of 2) Never done   Colon Cancer Screening  Never done   Flu Shot  11/07/2023*   Medicare Annual Wellness Visit  06/09/2024   HPV Vaccine  Aged Out  *Topic was postponed. The date shown is not the original due date.    Advanced directives: (ACP Link)Information on Advanced Care Planning can be found at [facility] Directives Advance Health Care Directives ([email or web address]   Next Medicare Annual Wellness Visit scheduled for next year: Yes   06/15/24 @ 2:00 pm in person

## 2023-06-10 NOTE — Progress Notes (Signed)
Subjective:   David [name] is a 68 y.o. male who presents for an Initial Medicare Annual Wellness Visit.  Visit Complete: Virtual I connected with  David [name] on 06/10/23 by a audio enabled telemedicine application and verified that I am speaking with the correct person using two identifiers.  Patient Location: Home  Provider Location: Office/Clinic  I discussed the limitations of evaluation and management by telemedicine. The patient expressed understanding and agreed to proceed.  Vital Signs: Because this visit was a virtual/telehealth visit, some criteria may be missing or patient reported. Any vitals not documented were not able to be obtained and vitals that have been documented are patient reported.  Cardiac Risk Factors include: advanced age (>66men, >69 women);hypertension;male gender;smoking/ tobacco exposure;sedentary lifestyle     Objective:    There were no vitals filed for this visit. There is no height or weight on file to calculate BMI.     06/10/2023    3:18 PM 02/21/2023   11:01 AM 02/18/2023    1:05 PM 10/05/2021   12:34 PM 06/30/2021    9:38 AM 01/11/2020    2:31 PM 05/17/2019    3:45 PM  Advanced Directives  Does Patient Have a Medical Advance Directive? No No No No No No No  Would patient like information on creating a medical advance directive? No - Patient declined No - Patient declined No - Patient declined  No - Patient declined No - Patient declined     Current Medications (verified) Outpatient Encounter Medications as of 06/10/2023  Medication Sig   Budeson-Glycopyrrol-Formoterol (BREZTRI AEROSPHERE) 160-9-4.8 MCG/ACT AERO Inhale 2 puffs into the lungs 2 (two) times daily.   metoprolol tartrate (LOPRESSOR) 25 MG tablet Take 1 tablet (25 mg total) by mouth 2 (two) times daily.   mirtazapine (REMERON) 15 MG tablet Take 1 tablet (15 mg total) by mouth at bedtime.   naproxen (NAPROSYN) 500 MG tablet Take 1 tablet (500 mg total) by mouth 2 (two)  times daily with a meal.   pantoprazole (PROTONIX) 40 MG tablet TAKE 1 TABLET BY MOUTH EVERY DAY BEFORE BREAKFAST   traMADol (ULTRAM) 50 MG tablet Take 1 tablet (50 mg total) by mouth every 6 (six) hours as needed.   VENTOLIN HFA 108 (90 Base) MCG/ACT inhaler TAKE 2 PUFFS BY MOUTH EVERY 6 HOURS AS NEEDED FOR WHEEZE OR SHORTNESS OF BREATH   baclofen (LIORESAL) 10 MG tablet Take 0.5-1 tablets (5-10 mg total) by mouth 3 (three) times daily as needed for muscle spasms. (Patient not taking: Reported on 02/18/2023)   levETIRAcetam (KEPPRA) 750 MG tablet Take 1 tablet (750 mg total) by mouth 2 (two) times daily. (Patient not taking: Reported on 06/10/2023)   nicotine (NICODERM CQ - DOSED IN MG/24 HOURS) 21 mg/24hr patch Place 1 patch (21 mg total) onto the skin daily. (Patient not taking: Reported on 06/10/2023)   No facility-administered encounter medications on file as of 06/10/2023.    Allergies (verified) Tylenol [acetaminophen] and Vicodin [hydrocodone-acetaminophen]   History: Past Medical History:  Diagnosis Date   Adjustment disorder    Anemia    Arthritis    Asthma    Centrilobular emphysema (HCC)    Chest pain    Chronic bursitis    of left shoulder   Chronic pain of both shoulders    Chronic respiratory failure with hypoxia (HCC)    COPD (chronic obstructive pulmonary disease) (HCC)    not on home o2   GERD (gastroesophageal reflux disease)  Gout    History of kidney stones    History of melanoma    Hypertension    Insomnia    Long term prescription opiate use    Multiple lung nodules    Oxygen deficiency    O2 1 LITER [facility]   Pain    chronic pain all over   Severe protein-calorie malnutrition (HCC)    Small cell lung cancer (HCC)    melanoma   Stroke (HCC) 2023   Tobacco abuse    Tobacco use    Past Surgical History:  Procedure Laterality Date   ELECTROMAGNETIC NAVIGATION BROCHOSCOPY N/A 07/22/2016   Procedure: ELECTROMAGNETIC NAVIGATION BRONCHOSCOPY;  Surgeon:  Erin Fulling, MD;  Location: [facility] ORS;  Service: Cardiopulmonary;  Laterality: N/A;   MASS EXCISION N/A 02/21/2023   Procedure: EXCISION MASS;  Surgeon: Carolan Shiver, MD;  Location: [facility] OR[facility];  Service: General;  Laterality: N/A;   PERIPHERAL VASCULAR CATHETERIZATION N/A 09/15/2015   Procedure: Shelda Pal Cath Insertion;  Surgeon: Annice Needy, MD;  Location: [facility] INVASIVE CV [facility];  Service: Cardiovascular;  Laterality: N/A;   PORTA CATH REMOVAL N/A 05/05/2017   Procedure: Shelda Pal Cath Removal;  Surgeon: Annice Needy, MD;  Location: [facility] INVASIVE CV LAB;  Service: Cardiovascular;  Laterality: N/A;   SKIN GRAFT     [facility]   Family History  Problem Relation Age of Onset   Lung cancer Mother    Heart attack Mother 89   Cirrhosis Father 64   Social History   Socioeconomic History   Marital status: Widowed    Spouse name: Not on file   Number of children: Not on file   Years of education: 8th grade   Highest education level: Not on file  Occupational History   Occupation: Retired Designer, fashion/clothing  Tobacco Use   Smoking status: Every Day    Current packs/day: 1.00    Average packs/day: 1 pack/day for 51.3 years (51.3 ttl pk-yrs)    Types: Cigarettes    Start date: 08/12/1994   Smokeless tobacco: Never   Tobacco comments:    Smoking 1 ppd s of 11/12/2021 hfb   Vaping Use   Vaping status: Never Used  Substance and Sexual Activity   Alcohol use: No    Alcohol/week: 0.0 standard drinks of alcohol   Drug use: Yes    Types: "Crack" cocaine    Comment: 02/14/23   Sexual activity: Never  Other Topics Concern   Not on file  Social History Narrative   Lives at home with wife   Social Determinants of Health   Financial Resource Strain: Low Risk  (06/10/2023)   Overall Financial Resource Strain (CARDIA)    Difficulty of Paying Living Expenses: Not very hard  Recent Concern: Financial Resource Strain - Medium Risk (04/20/2023)   Overall Financial Resource Strain (CARDIA)    Difficulty of Paying Living  Expenses: Somewhat hard  Food Insecurity: No Food Insecurity (06/10/2023)   Hunger Vital Sign    Worried About Running Out of Food in the Last Year: Never true    Ran Out of Food in the Last Year: Never true  Transportation Needs: No Transportation Needs (06/10/2023)   PRAPARE - Administrator, Civil Service (Medical): No    Lack of Transportation (Non-Medical): No  Physical Activity: Insufficiently Active (06/10/2023)   Exercise Vital Sign    Days of Exercise per Week: 5 days    Minutes of Exercise per Session: 10 min  Stress: No Stress Concern Present (  06/10/2023)   [employer] [employer] - Occupational Stress Questionnaire    Feeling of Stress : Not at all  Social Connections: Socially Isolated (06/10/2023)   Social Connection and Isolation Panel [NHANES]    Frequency of Communication with Friends and Family: More than three times a week    Frequency of Social Gatherings with Friends and Family: More than three times a week    Attends Religious Services: Never    Database administrator or Organizations: No    Attends Banker Meetings: Never    Marital Status: Widowed    Tobacco Counseling Ready to quit: Not Answered Counseling given: Not Answered Tobacco comments: Smoking 1 ppd s of 11/12/2021 hfb    Clinical Intake:  Pre-visit preparation completed: Yes  Pain : No/denies pain     Nutritional Status: BMI <19  Underweight Nutritional Risks: None Diabetes: No  How often do you need to have someone help you when you read instructions, pamphlets, or other written materials from your doctor or pharmacy?: 1 - Never  Interpreter Needed?: No  Information entered by :: Kennedy Bucker, LPN   Activities of Daily Living    06/10/2023    3:19 PM 02/18/2023    1:07 PM  In your present state of health, do you have any difficulty performing the following activities:  Hearing? 0   Vision? 0   Difficulty concentrating or making decisions? 0    Walking or climbing stairs? 0   Dressing or bathing? 0   Doing errands, shopping? 0 0  Preparing Food and eating ? N   Using the Toilet? N   In the past six months, have you accidently leaked urine? N   Do you have problems with loss of bowel control? N   Managing your Medications? N   Managing your Finances? N   Housekeeping or managing your Housekeeping? N     Patient Care Team: Smitty Cords, DO as PCP - General (Family Medicine) Marlowe Sax, RN as Case Manager (General Practice)  Indicate any recent Medical Services you may have received from other than [facility] providers in the past year (date may be approximate).     Assessment:   This is a routine wellness examination for [facility].  Hearing/Vision screen Hearing Screening - Comments:: No aids Vision Screening - Comments:: Has glasses, but doesn't wear them- [facility]   Goals Addressed             This Visit's Progress    DIET - EAT MORE FRUITS AND VEGETABLES         Depression Screen    06/10/2023    3:16 PM 04/20/2023    3:47 PM 09/18/2021    8:29 AM 08/22/2020    9:13 AM 01/09/2018    4:14 PM 09/07/2016    9:26 AM 04/20/2016    4:56 PM  PHQ 2/9 Scores  PHQ - 2 Score 0 3 3 0 0 0 2  PHQ- 9 Score 0 15 15    8     Fall Risk    06/10/2023    3:18 PM 09/18/2021    8:29 AM 08/22/2020    9:12 AM 01/09/2018    4:14 PM 09/07/2016    9:26 AM  Fall Risk   Falls in the past year? 0 0 0 No No  Number falls in past yr: 0 0 0    Injury with Fall? 0 0 0    Risk for fall due to :  No Fall Risks No Fall Risks     Follow up Falls prevention discussed;Falls evaluation completed Falls evaluation completed       MEDICARE RISK AT HOME: Medicare Risk at Home Any stairs in or around the home?: Yes If so, are there any without handrails?: No Home free of loose throw rugs in walkways, pet beds, electrical cords, etc?: Yes Adequate lighting in your home to reduce risk of falls?: Yes Life alert?: No Use of a cane,  walker or w/c?: No Grab bars in the bathroom?: No Shower chair or bench in shower?: No Elevated toilet seat or a handicapped toilet?: No  TIMED UP AND GO:  Was the test performed? No    Cognitive Function:        06/10/2023    3:19 PM  6CIT Screen  What Year? 0 points  What month? 0 points  What time? 3 points  Count back from 20 0 points  Months in reverse 4 points  Repeat phrase 10 points  Total Score 17 points    Immunizations  There is no immunization history on file for this patient.  TDAP status: Due, Education has been provided regarding the importance of this vaccine. Advised may receive this vaccine at local pharmacy or Health Dept. Aware to provide a copy of the vaccination record if obtained from local pharmacy or Health Dept. Verbalized acceptance and understanding.  Flu Vaccine status: Declined, Education has been provided regarding the importance of this vaccine but patient still declined. Advised may receive this vaccine at local pharmacy or Health Dept. Aware to provide a copy of the vaccination record if obtained from local pharmacy or Health Dept. Verbalized acceptance and understanding.  Pneumococcal vaccine status: Declined,  Education has been provided regarding the importance of this vaccine but patient still declined. Advised may receive this vaccine at local pharmacy or Health Dept. Aware to provide a copy of the vaccination record if obtained from local pharmacy or Health Dept. Verbalized acceptance and understanding.   Covid-19 vaccine status: Declined, Education has been provided regarding the importance of this vaccine but patient still declined. Advised may receive this vaccine at local pharmacy or Health Dept.or vaccine clinic. Aware to provide a copy of the vaccination record if obtained from local pharmacy or Health Dept. Verbalized acceptance and understanding.  Qualifies for Shingles Vaccine? Yes   Zostavax completed No   Shingrix Completed?:  No.    Education has been provided regarding the importance of this vaccine. Patient has been advised to call insurance company to determine out of pocket expense if they have not yet received this vaccine. Advised may also receive vaccine at local pharmacy or Health Dept. Verbalized acceptance and understanding.  Screening Tests Health Maintenance  Topic Date Due   COVID-19 Vaccine (1) Never done   Pneumonia Vaccine 27+ Years old (1 of 2 - PCV) Never done   HIV Screening  Never done   Hepatitis C Screening  Never done   DTaP/Tdap/Td (1 - Tdap) Never done   Zoster Vaccines- Shingrix (1 of 2) Never done   Colonoscopy  Never done   INFLUENZA VACCINE  11/07/2023 (Originally 03/10/2023)   Medicare Annual Wellness (AWV)  06/09/2024   HPV VACCINES  Aged Out    Health Maintenance  Health Maintenance Due  Topic Date Due   COVID-19 Vaccine (1) Never done   Pneumonia Vaccine 2+ Years old (1 of 2 - PCV) Never done   HIV Screening  Never done   Hepatitis C  Screening  Never done   DTaP/Tdap/Td (1 - Tdap) Never done   Zoster Vaccines- Shingrix (1 of 2) Never done   Colonoscopy  Never done    Declined referral for colonoscopy  Lung Cancer Screening: (Low Dose CT Chest recommended if Age 32-80 years, 20 pack-year currently smoking OR have quit w/in 15years.) does qualify.   Lung Cancer Screening Referral: declined referral for CT scan  Additional Screening:  Hepatitis C Screening: does qualify; Completed no  Vision Screening: Recommended annual ophthalmology exams for early detection of glaucoma and other disorders of the eye. Is the patient up to date with their annual eye exam?  No  Who is the provider or what is the name of the office in which the patient attends annual eye exams? No one If pt is not established with a provider, would they like to be referred to a provider to establish care? No .   Dental Screening: Recommended annual dental exams for proper oral hygiene   Community  Resource Referral / Chronic Care Management: CRR required this visit?  No   CCM required this visit?  No    Plan:     I have personally reviewed and noted the following in the patient's chart:   Medical and social history Use of alcohol, tobacco or illicit drugs  Current medications and supplements including opioid prescriptions. Patient is not currently taking opioid prescriptions. Functional ability and status Nutritional status Physical activity Advanced directives List of other physicians Hospitalizations, surgeries, and ER visits in previous 12 months Vitals Screenings to include cognitive, depression, and falls Referrals and appointments  In addition, I have reviewed and discussed with patient certain preventive protocols, quality metrics, and best practice recommendations. A written personalized care plan for preventive services as well as general preventive health recommendations were provided to patient.     Hal Hope, LPN   [id number]   After Visit Summary: (MyChart) Due to this being a telephonic visit, the after visit summary with patients personalized plan was offered to patient via MyChart   Nurse Notes: none

## 2023-06-20 ENCOUNTER — Ambulatory Visit (INDEPENDENT_AMBULATORY_CARE_PROVIDER_SITE_OTHER): Payer: 59 | Admitting: Family Medicine

## 2023-06-20 ENCOUNTER — Other Ambulatory Visit: Payer: Self-pay | Admitting: Family Medicine

## 2023-06-20 ENCOUNTER — Encounter: Payer: Self-pay | Admitting: Family Medicine

## 2023-06-20 VITALS — BP 110/60 | Ht 69.0 in | Wt 110.0 lb

## 2023-06-20 DIAGNOSIS — H6122 Impacted cerumen, left ear: Secondary | ICD-10-CM

## 2023-06-20 DIAGNOSIS — J432 Centrilobular emphysema: Secondary | ICD-10-CM

## 2023-06-20 MED ORDER — ALBUTEROL SULFATE HFA 108 (90 BASE) MCG/ACT IN AERS
2.0000 | INHALATION_SPRAY | RESPIRATORY_TRACT | 5 refills | Status: DC | PRN
Start: 2023-06-20 — End: 2023-12-22

## 2023-06-20 NOTE — Progress Notes (Signed)
Subjective:    Patient ID: David [name], male    DOB: April 22, 1956, 67 y.o.   MRN: [id number]  David [name] is a 67 y.o. male presenting on 06/20/2023 for Ear Fullness (Left side fullness present a couple days)  Patient presents for a same day appointment.  HPI  Discussed the use of AI scribe software for clinical note transcription with the patient, who gave verbal consent to proceed.  Left Ear Cerumen / Reduced Hearing  Presented with complaints of decreased hearing due to ear wax accumulation, particularly in the left ear. He reported no pain, but a significant reduction in auditory acuity. The patient had previously sought medical attention for similar issues, with wax removal providing relief.  In addition to the ear complaints, the patient also mentioned the need for inhaler medication refills. He has been using two types of inhalers, one for daily use and another for rescue purposes during acute episodes of breathing difficulty. The patient did not specify any recent exacerbations of respiratory symptoms. - he has Breztri inhaler per Pulm - He needs refill on Albuterol rescue inhaler  The patient also mentioned a scar on his back from recent excisional biopsy of skin cancer He reported managing his daily activities independently at home.       06/10/2023    3:16 PM 04/20/2023    3:47 PM 09/18/2021    8:29 AM  Depression screen PHQ 2/9  Decreased Interest 0 0 0  Down, Depressed, Hopeless 0 3 3  PHQ - 2 Score 0 3 3  Altered sleeping 0 3 3  Tired, decreased energy 0 3 3  Change in appetite 0 3 3  Feeling bad or failure about yourself  0 3 3  Trouble concentrating 0 0 0  Moving slowly or fidgety/restless 0 0 0  Suicidal thoughts 0 0 0  PHQ-9 Score 0 15 15  Difficult doing work/chores Not difficult at all Not difficult at all Not difficult at all    Social History   Tobacco Use   Smoking status: Every Day    Current packs/day: 1.00    Average packs/day: 1  pack/day for 51.4 years (51.4 ttl pk-yrs)    Types: Cigarettes    Start date: 08/12/1994   Smokeless tobacco: Never   Tobacco comments:    Smoking 1 ppd s of 11/12/2021 hfb   Vaping Use   Vaping status: Never Used  Substance Use Topics   Alcohol use: No    Alcohol/week: 0.0 standard drinks of alcohol   Drug use: Yes    Types: "Crack" cocaine    Comment: 02/14/23    Review of Systems Per HPI unless specifically indicated above     Objective:    BP 110/60   Ht 5\' 9"  (1.753 m)   Wt 110 lb (49.9 kg)   BMI 16.24 kg/m   Wt Readings from Last 3 Encounters:  06/20/23 110 lb (49.9 kg)  02/21/23 117 lb 1 oz (53.1 kg)  02/18/23 117 lb (53.1 kg)    Physical Exam Vitals and nursing note reviewed.  Constitutional:      General: He is not in acute distress.    Appearance: Normal appearance. He is well-developed. He is not diaphoretic.     Comments: Thin appearing 67 yr male chronically ill, currently comfortable, cooperative  HENT:     Head: Normocephalic and atraumatic.     Right Ear: There is no impacted cerumen.     Left Ear: There is impacted  cerumen.     Ears:     Comments: Left ear with cerumen impacted. Eyes:     General:        Right eye: No discharge.        Left eye: No discharge.     Conjunctiva/sclera: Conjunctivae normal.  Cardiovascular:     Rate and Rhythm: Normal rate.  Pulmonary:     Effort: Pulmonary effort is normal.  Skin:    General: Skin is warm and dry.     Findings: Lesion (scar on mid upper back from excision previously) present. No erythema or rash.  Neurological:     Mental Status: He is alert and oriented to person, place, and time.  Psychiatric:        Mood and Affect: Mood normal.        Behavior: Behavior normal.        Thought Content: Thought content normal.     Comments: Well groomed, good eye contact, normal speech and thoughts    Results for orders placed or performed during the hospital encounter of 02/21/23  CBC per protocol  Result  Value Ref Range   WBC 4.8 4.0 - 10.5 K/uL   RBC 3.98 (L) 4.22 - 5.81 MIL/uL   Hemoglobin 12.0 (L) 13.0 - 17.0 g/dL   HCT 56.3 (L) 87.5 - 64.3 %   MCV 89.7 80.0 - 100.0 fL   MCH 30.2 26.0 - 34.0 pg   MCHC 33.6 30.0 - 36.0 g/dL   RDW 32.9 51.8 - 84.1 %   Platelets 210 150 - 400 K/uL   nRBC 0.0 0.0 - 0.2 %  Basic metabolic panel per protocol  Result Value Ref Range   Sodium 137 135 - 145 mmol/L   Potassium 3.7 3.5 - 5.1 mmol/L   Chloride 104 98 - 111 mmol/L   CO2 26 22 - 32 mmol/L   Glucose, Bld 88 70 - 99 mg/dL   BUN 11 8 - 23 mg/dL   Creatinine, Ser 6.60 0.61 - 1.24 mg/dL   Calcium 8.8 (L) 8.9 - 10.3 mg/dL   GFR, Estimated >63 >01 mL/min   Anion gap 7 5 - 15      Assessment & Plan:   Problem List Items Addressed This Visit     Centrilobular emphysema (HCC)   Relevant Medications   albuterol (VENTOLIN HFA) 108 (90 Base) MCG/ACT inhaler   Other Visit Diagnoses     Hearing loss of left ear due to cerumen impaction    -  Primary      Impacted Cerumen L>R Cerumen impaction Used Q-tip today in office and manually extracted cerumen. No flushing required. He has Debrox ear drops to use at home if further build up  Emphysema COPD Followed by Pulm Patient reported needing inhaler refill, Albuterol rescue -Continue Breztri, two puffs in the morning and two puffs in the evening.  General Health Maintenance / Followup Plans -Schedule follow-up appointment in March 2025 for routine blood work.        Meds ordered this encounter  Medications   albuterol (VENTOLIN HFA) 108 (90 Base) MCG/ACT inhaler    Sig: Inhale 2 puffs into the lungs every 4 (four) hours as needed for wheezing or shortness of breath.    Dispense:  18 each    Refill:  5      Follow up plan: Return in about 4 months (around 10/18/2023) for 4 month follow-up COPD, updates, Labs AFTER (non fasting).   Saralyn Pilar, DO [place]  [facility] 06/20/2023, 2:57  PM

## 2023-06-20 NOTE — Patient Instructions (Addendum)
Thank you for coming to the office today.  Ear wax is removed  Refilled Albuterol inhaler rescue only  Use Breztri inhaler 2 puff twice a day every day  Please schedule a Follow-up Appointment to: Return in about 4 months (around 10/18/2023) for 4 month follow-up COPD, updates, Labs AFTER (non fasting).  If you have any other questions or concerns, please feel free to call the office or send a message through MyChart. You may also schedule an earlier appointment if necessary.  Additionally, you may be receiving a survey about your experience at our office within a few days to 1 week by e-mail or mail. We value your feedback.  Saralyn Pilar, DO [facility], [place]

## 2023-07-27 ENCOUNTER — Ambulatory Visit: Payer: Self-pay

## 2023-07-27 ENCOUNTER — Other Ambulatory Visit: Payer: Self-pay

## 2023-07-27 ENCOUNTER — Ambulatory Visit: Payer: 59 | Admitting: Internal Medicine

## 2023-07-27 NOTE — Progress Notes (Deleted)
Subjective:    Patient ID: David [name], male    DOB: [birth year], 67 y.o.   MRN: [id number]  HPI    Review of Systems   Past Medical History:  Diagnosis Date   Adjustment disorder    Anemia    Arthritis    Asthma    Centrilobular emphysema (HCC)    Chest pain    Chronic bursitis    of left shoulder   Chronic pain of both shoulders    Chronic respiratory failure with hypoxia (HCC)    COPD (chronic obstructive pulmonary disease) (HCC)    not on home o2   GERD (gastroesophageal reflux disease)    Gout    History of kidney stones    History of melanoma    Hypertension    Insomnia    Long term prescription opiate use    Multiple lung nodules    Oxygen deficiency    O2 1 LITER [facility]   Pain    chronic pain all over   Severe protein-calorie malnutrition (HCC)    Small cell lung cancer (HCC)    melanoma   Stroke (HCC) 2023   Tobacco abuse    Tobacco use     Current Outpatient Medications  Medication Sig Dispense Refill   albuterol (VENTOLIN HFA) 108 (90 Base) MCG/ACT inhaler Inhale 2 puffs into the lungs every 4 (four) hours as needed for wheezing or shortness of breath. 18 each 5   Budeson-Glycopyrrol-Formoterol (BREZTRI AEROSPHERE) 160-9-4.8 MCG/ACT AERO Inhale 2 puffs into the lungs 2 (two) times daily. 10.7 g 11   metoprolol tartrate (LOPRESSOR) 25 MG tablet Take 1 tablet (25 mg total) by mouth 2 (two) times daily. 180 tablet 3   mirtazapine (REMERON) 15 MG tablet Take 1 tablet (15 mg total) by mouth at bedtime. 90 tablet 1   naproxen (NAPROSYN) 500 MG tablet Take 1 tablet (500 mg total) by mouth 2 (two) times daily with a meal. 20 tablet 2   pantoprazole (PROTONIX) 40 MG tablet TAKE 1 TABLET BY MOUTH EVERY DAY BEFORE BREAKFAST 90 tablet 1   No current facility-administered medications for this visit.    Allergies  Allergen Reactions   Tylenol [Acetaminophen] Other (See Comments)    Unable to take due to chemotherapy regimen   Vicodin  [Hydrocodone-Acetaminophen] Nausea And Vomiting and Rash    Family History  Problem Relation Age of Onset   Lung cancer Mother    Heart attack Mother 38   Cirrhosis Father 72    Social History   Socioeconomic History   Marital status: Widowed    Spouse name: Not on file   Number of children: Not on file   Years of education: 8th grade   Highest education level: Not on file  Occupational History   Occupation: Retired Designer, fashion/clothing  Tobacco Use   Smoking status: Every Day    Current packs/day: 1.00    Average packs/day: 1 pack/day for 51.5 years (51.5 ttl pk-yrs)    Types: Cigarettes    Start date: 08/12/1994   Smokeless tobacco: Never   Tobacco comments:    Smoking 1 ppd s of 11/12/2021 hfb   Vaping Use   Vaping status: Never Used  Substance and Sexual Activity   Alcohol use: No    Alcohol/week: 0.0 standard drinks of alcohol   Drug use: Yes    Types: "Crack" cocaine    Comment: 02/14/23   Sexual activity: Never  Other Topics Concern   Not on file  Social History Narrative   Lives at home with wife   Social Drivers of Health   Financial Resource Strain: Low Risk  (06/10/2023)   Overall Financial Resource Strain (CARDIA)    Difficulty of Paying Living Expenses: Not very hard  Recent Concern: Financial Resource Strain - Medium Risk (04/20/2023)   Overall Financial Resource Strain (CARDIA)    Difficulty of Paying Living Expenses: Somewhat hard  Food Insecurity: No Food Insecurity (06/10/2023)   Hunger Vital Sign    Worried About Running Out of Food in the Last Year: Never true    Ran Out of Food in the Last Year: Never true  Transportation Needs: No Transportation Needs (06/10/2023)   PRAPARE - Administrator, Civil Service (Medical): No    Lack of Transportation (Non-Medical): No  Physical Activity: Insufficiently Active (06/10/2023)   Exercise Vital Sign    Days of Exercise per Week: 5 days    Minutes of Exercise per Session: 10 min  Stress: No Stress Concern  Present (06/10/2023)   [employer] of Occupational Health - Occupational Stress Questionnaire    Feeling of Stress : Not at all  Social Connections: Socially Isolated (06/10/2023)   Social Connection and Isolation Panel [NHANES]    Frequency of Communication with Friends and Family: More than three times a week    Frequency of Social Gatherings with Friends and Family: More than three times a week    Attends Religious Services: Never    Database administrator or Organizations: No    Attends Banker Meetings: Never    Marital Status: Widowed  Intimate Partner Violence: Not At Risk (06/10/2023)   Humiliation, Afraid, Rape, and Kick questionnaire    Fear of Current or Ex-Partner: No    Emotionally Abused: No    Physically Abused: No    Sexually Abused: No     Constitutional: Denies fever, malaise, fatigue, headache or abrupt weight changes.  HEENT: Denies eye pain, eye redness, ear pain, ringing in the ears, wax buildup, runny nose, nasal congestion, bloody nose, or sore throat. Respiratory: Denies difficulty breathing, shortness of breath, cough or sputum production.   Cardiovascular: Denies chest pain, chest tightness, palpitations or swelling in the hands or feet.  Gastrointestinal: Denies abdominal pain, bloating, constipation, diarrhea or blood in the stool.  GU: Denies urgency, frequency, pain with urination, burning sensation, blood in urine, odor or discharge. Musculoskeletal: Denies decrease in range of motion, difficulty with gait, muscle pain or joint pain and swelling.  Skin: Denies redness, rashes, lesions or ulcercations.  Neurological: Denies dizziness, difficulty with memory, difficulty with speech or problems with balance and coordination.  Psych: Denies anxiety, depression, SI/HI.  No other specific complaints in a complete review of systems (except as listed in HPI above).      Objective:   Physical Exam  There were no vitals taken for this  visit. Wt Readings from Last 3 Encounters:  06/20/23 110 lb (49.9 kg)  02/21/23 117 lb 1 oz (53.1 kg)  02/18/23 117 lb (53.1 kg)    General: Appears their stated age, well developed, well nourished in NAD. Skin: Warm, dry and intact. No rashes, lesions or ulcerations noted. HEENT: Head: normal shape and size; Eyes: sclera white, no icterus, conjunctiva pink, PERRLA and EOMs intact; Ears: Tm's gray and intact, normal light reflex; Nose: mucosa pink and moist, septum midline; Throat/Mouth: Teeth present, mucosa pink and moist, no exudate, lesions or ulcerations noted.  Neck:  Neck supple, trachea  midline. No masses, lumps or thyromegaly present.  Cardiovascular: Normal rate and rhythm. S1,S2 noted.  No murmur, rubs or gallops noted. No JVD or BLE edema. No carotid bruits noted. Pulmonary/Chest: Normal effort and positive vesicular breath sounds. No respiratory distress. No wheezes, rales or ronchi noted.  Abdomen: Soft and nontender. Normal bowel sounds. No distention or masses noted. Liver, spleen and kidneys non palpable. Musculoskeletal: Normal range of motion. No signs of joint swelling. No difficulty with gait.  Neurological: Alert and oriented. Cranial nerves II-XII grossly intact. Coordination normal.  Psychiatric: Mood and affect normal. Behavior is normal. Judgment and thought content normal.    BMET    Component Value Date/Time   NA 137 02/21/2023 1058   NA 142 05/23/2014 1038   K 3.7 02/21/2023 1058   K 3.9 05/23/2014 1038   CL 104 02/21/2023 1058   CL 106 05/23/2014 1038   CO2 26 02/21/2023 1058   CO2 30 05/23/2014 1038   GLUCOSE 88 02/21/2023 1058   GLUCOSE 71 05/23/2014 1038   BUN 11 02/21/2023 1058   BUN 12 05/23/2014 1038   CREATININE 0.85 02/21/2023 1058   CREATININE 1.26 05/23/2014 1038   CALCIUM 8.8 (L) 02/21/2023 1058   CALCIUM 9.2 05/23/2014 1038   GFRNONAA >60 02/21/2023 1058   GFRAA >60 02/16/2019 1100    Lipid Panel  No results found for: "CHOL",  "TRIG", "HDL", "CHOLHDL", "VLDL", "LDLCALC"  CBC    Component Value Date/Time   WBC 4.8 02/21/2023 1058   RBC 3.98 (L) 02/21/2023 1058   HGB 12.0 (L) 02/21/2023 1058   HGB 14.3 05/23/2014 1038   HCT 35.7 (L) 02/21/2023 1058   HCT 43.6 05/23/2014 1038   PLT 210 02/21/2023 1058   PLT 213 05/23/2014 1038   MCV 89.7 02/21/2023 1058   MCV 91 05/23/2014 1038   MCH 30.2 02/21/2023 1058   MCHC 33.6 02/21/2023 1058   RDW 13.2 02/21/2023 1058   RDW 13.7 05/23/2014 1038   LYMPHSABS 1.7 02/16/2019 1100   MONOABS 0.6 02/16/2019 1100   EOSABS 0.2 02/16/2019 1100   BASOSABS 0.0 02/16/2019 1100    Hgb A1C No results found for: "HGBA1C"          Assessment & Plan:    Follow-up with your PCP as previously scheduled Nicki Reaper, NP
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
## 2023-07-28 ENCOUNTER — Ambulatory Visit: Payer: Self-pay | Admitting: *Deleted

## 2023-07-28 ENCOUNTER — Telehealth: Payer: Self-pay

## 2023-07-28 ENCOUNTER — Other Ambulatory Visit: Payer: Self-pay

## 2023-07-28 NOTE — Patient Outreach (Signed)
  Care Management   Outreach Note  07/28/2023 Name: David [name] MRN: [id number] DOB: 01-24-1956  An unsuccessful outreach attempt was made today for a scheduled Care Management visit.   Follow Up Plan:  Received automated prompt. Unable to leave a voice message.  A member of the care team will make additional attempts within the next week.   [facility]  [facility], [facility] RN Care Manager Direct Dial: [phone] Website: [email or web address]

## 2023-07-28 NOTE — Patient Outreach (Signed)
  Care Coordination   07/28/2023 Name: DEARIUS MCENTIRE MRN: [id number] DOB: 1955-10-04   Care Coordination Outreach Attempts:  A second unsuccessful outreach was attempted today to offer the patient with information about available complex care management services.  Follow Up Plan:  Additional outreach attempts will be made to offer the patient complex care management information and services.   Encounter Outcome:  No Answer   Care Coordination Interventions:  No, not indicated    Kendi Defalco, LCSW [place]  [facility], [facility] Licensed Clinical Social Worker Care Coordinator  Direct Dial: [phone]

## 2023-08-12 ENCOUNTER — Other Ambulatory Visit: Payer: Self-pay | Admitting: Family Medicine

## 2023-08-12 DIAGNOSIS — I10 Essential (primary) hypertension: Secondary | ICD-10-CM

## 2023-08-12 DIAGNOSIS — R630 Anorexia: Secondary | ICD-10-CM

## 2023-08-12 DIAGNOSIS — K219 Gastro-esophageal reflux disease without esophagitis: Secondary | ICD-10-CM

## 2023-08-16 NOTE — Telephone Encounter (Signed)
 Requested Prescriptions  Pending Prescriptions Disp Refills   pantoprazole  (PROTONIX ) 40 MG tablet [Pharmacy Med Name: PANTOPRAZOLE  SOD DR 40 MG TAB] 90 tablet 3    Sig: TAKE 1 TABLET BY MOUTH EVERY DAY BEFORE BREAKFAST     Gastroenterology: Proton Pump Inhibitors Passed - 08/16/2023  9:35 AM      Passed - Valid encounter within last 12 months    Recent Outpatient Visits           1 month ago Hearing loss of left ear due to cerumen impaction   [place] [facility] [place], Marsa PARAS, DO   8 months ago Abnormal skin growth   [place] [facility] [place] Marsa PARAS, DO   1 year ago Seizure disorder [facility])   [place] [facility] [place] Marsa PARAS, DO   1 year ago Centrilobular emphysema [facility])   [place] [facility] [place] Marsa PARAS, DO   2 years ago Bilateral impacted cerumen   [place] [facility] Daphane Rosella, NP       Future Appointments             In 2 months [place], Marsa PARAS, DO [place] [facility]             metoprolol  tartrate (LOPRESSOR ) 25 MG tablet [Pharmacy Med Name: METOPROLOL  TARTRATE 25 MG TAB] 180 tablet 1    Sig: TAKE 1 TABLET BY MOUTH TWICE A DAY     Cardiovascular:  Beta Blockers Passed - 08/16/2023  9:35 AM      Passed - Last BP in normal range    BP Readings from Last 1 Encounters:  06/20/23 110/60         Passed - Last Heart Rate in normal range    Pulse Readings from Last 1 Encounters:  02/21/23 (!) 56         Passed - Valid encounter within last 6 months    Recent Outpatient Visits           1 month ago Hearing loss of left ear due to cerumen impaction   [place] [facility] [place], Marsa PARAS, DO   8 months ago Abnormal skin growth   [place] [facility] [place] Marsa PARAS, DO   1 year ago Seizure disorder [facility])   [place] [facility] [place] Marsa PARAS, DO   1 year ago Centrilobular emphysema [facility])   [place] [facility] [place] Marsa PARAS, DO   2 years ago Bilateral impacted cerumen   [place] [facility] Daphane Rosella, NP       Future Appointments             In 2 months [place], Marsa PARAS, DO [facility], [facility]

## 2023-10-18 ENCOUNTER — Ambulatory Visit: Payer: Self-pay | Admitting: Family Medicine

## 2023-11-04 ENCOUNTER — Encounter: Payer: Self-pay | Admitting: Family Medicine

## 2023-11-04 ENCOUNTER — Ambulatory Visit: Admitting: Family Medicine

## 2023-11-04 VITALS — BP 110/68 | HR 78 | Ht 69.0 in | Wt 106.0 lb

## 2023-11-04 DIAGNOSIS — L089 Local infection of the skin and subcutaneous tissue, unspecified: Secondary | ICD-10-CM

## 2023-11-04 DIAGNOSIS — S60429A Blister (nonthermal) of unspecified finger, initial encounter: Secondary | ICD-10-CM

## 2023-11-04 DIAGNOSIS — M21611 Bunion of right foot: Secondary | ICD-10-CM | POA: Diagnosis not present

## 2023-11-04 DIAGNOSIS — L02511 Cutaneous abscess of right hand: Secondary | ICD-10-CM

## 2023-11-04 DIAGNOSIS — M79671 Pain in right foot: Secondary | ICD-10-CM | POA: Diagnosis not present

## 2023-11-04 MED ORDER — SULFAMETHOXAZOLE-TRIMETHOPRIM 800-160 MG PO TABS
1.0000 | ORAL_TABLET | Freq: Two times a day (BID) | ORAL | 0 refills | Status: AC
Start: 2023-11-04 — End: 2023-11-11

## 2023-11-04 MED ORDER — MUPIROCIN 2 % EX OINT
1.0000 | TOPICAL_OINTMENT | Freq: Two times a day (BID) | CUTANEOUS | 0 refills | Status: DC
Start: 2023-11-04 — End: 2024-03-16

## 2023-11-04 NOTE — Progress Notes (Signed)
 Subjective:    Patient ID: David [name], male    DOB: 1955-10-20, 68 y.o.   MRN: [id number]  David [name] is a 68 y.o. male presenting on 11/04/2023 for Right Index Finger infection callus / Right Great toe bunion  Patient presents for a same day appointment. Patient walked in for appointment.   HPI  Discussed the use of AI scribe software for clinical note transcription with the patient, who gave verbal consent to proceed.  History of Present Illness   David [name] is a 68 year old male who presents with concerns of a possible glass piece in his right foot and an infection in his right index finger.  He is concerned about his right index finger, suspecting an infection. He noticed a change in the finger one morning (weeks ago unsure how long). The finger does not hurt or feel numb, but there is discoloration white appearance under skin and redness as well. The condition appears to be worsening, with redness spreading from the initial site. No fevers or chills are present.  He describes a sensation of a foreign body in his right foot, feeling like a piece of glass is embedded, which has been present for months. It does not cause pain but prevents him from wearing tennis shoes. He mentions a bone spur on his left heel that he needs removed as well. The right foot becomes sore when rubbed, and he is unsure how glass might have gotten into the foot.  He has no known allergies to antibiotics and has previously been on doxycycline, although he does not recall it well. He mentions difficulty obtaining his inhaler albuterol rescue, which he uses as needed. He is about to run out and will not receive a refill until the fifth of the next month.   Additionally he has a swollen nodule on his left neck. He has had skin cysts before in past.         06/10/2023    3:16 PM 04/20/2023    3:47 PM 09/18/2021    8:29 AM  Depression screen PHQ 2/9  Decreased Interest 0 0 0  Down, Depressed,  Hopeless 0 3 3  PHQ - 2 Score 0 3 3  Altered sleeping 0 3 3  Tired, decreased energy 0 3 3  Change in appetite 0 3 3  Feeling bad or failure about yourself  0 3 3  Trouble concentrating 0 0 0  Moving slowly or fidgety/restless 0 0 0  Suicidal thoughts 0 0 0  PHQ-9 Score 0 15 15  Difficult doing work/chores Not difficult at all Not difficult at all Not difficult at all       09/18/2021    8:30 AM  GAD 7 : Generalized Anxiety Score  Nervous, Anxious, on Edge 0  Control/stop worrying 0  Worry too much - different things 0  Trouble relaxing 0  Restless 3  Easily annoyed or irritable 3  Afraid - awful might happen 3  Total GAD 7 Score 9  Anxiety Difficulty Not difficult at all    Social History   Tobacco Use   Smoking status: Every Day    Current packs/day: 1.00    Average packs/day: 1 pack/day for 51.7 years (51.7 ttl pk-yrs)    Types: Cigarettes    Start date: 08/12/1994   Smokeless tobacco: Never   Tobacco comments:    Smoking 1 ppd s of 11/12/2021 hfb   Vaping Use   Vaping status: Never Used  Substance Use Topics   Alcohol use: No    Alcohol/week: 0.0 standard drinks of alcohol   Drug use: Yes    Types: "Crack" cocaine    Comment: 02/14/23    Review of Systems Per HPI unless specifically indicated above     Objective:    BP 110/68 (BP Location: Right Arm, Patient Position: Sitting)   Pulse 78   Ht 5\' 9"  (1.753 m)   Wt 106 lb (48.1 kg)   SpO2 98%   BMI 15.65 kg/m   Wt Readings from Last 3 Encounters:  11/04/23 106 lb (48.1 kg)  06/20/23 110 lb (49.9 kg)  02/21/23 117 lb 1 oz (53.1 kg)    Physical Exam Vitals and nursing note reviewed.  Constitutional:      General: He is not in acute distress.    Appearance: Normal appearance. He is well-developed. He is not diaphoretic.     Comments: Well-appearing, comfortable, cooperative  HENT:     Head: Normocephalic and atraumatic.  Eyes:     General:        Right eye: No discharge.        Left eye: No  discharge.     Conjunctiva/sclera: Conjunctivae normal.  Cardiovascular:     Rate and Rhythm: Normal rate.  Pulmonary:     Effort: Pulmonary effort is normal.  Musculoskeletal:     Comments: Right foot inner aspect great toe / bunion with larger protrusion and skin sore at surface. No palpable foreign body today. Seems to be focal ulceration from friction.  Skin:    General: Skin is warm and dry.     Findings: Lesion (RIGHT Index finger pulp DIP with white discoloration around a blister/callus no drainage or ulceration, some erythema and swelling associated. Left neck cystic lesion raised 1-2 cm raised nodular density) present. No erythema or rash.  Neurological:     Mental Status: He is alert and oriented to person, place, and time.  Psychiatric:        Mood and Affect: Mood normal.        Behavior: Behavior normal.        Thought Content: Thought content normal.     Comments: Well groomed, good eye contact, normal speech and thoughts       Right Index Finger      Right Foot / great toe bunion area.     Results for orders placed or performed during the hospital encounter of 02/21/23  CBC per protocol   Collection Time: 02/21/23 10:58 AM  Result Value Ref Range   WBC 4.8 4.0 - 10.5 K/uL   RBC 3.98 (L) 4.22 - 5.81 MIL/uL   Hemoglobin 12.0 (L) 13.0 - 17.0 g/dL   HCT 16.1 (L) 09.6 - 04.5 %   MCV 89.7 80.0 - 100.0 fL   MCH 30.2 26.0 - 34.0 pg   MCHC 33.6 30.0 - 36.0 g/dL   RDW 40.9 81.1 - 91.4 %   Platelets 210 150 - 400 K/uL   nRBC 0.0 0.0 - 0.2 %  Basic metabolic panel per protocol   Collection Time: 02/21/23 10:58 AM  Result Value Ref Range   Sodium 137 135 - 145 mmol/L   Potassium 3.7 3.5 - 5.1 mmol/L   Chloride 104 98 - 111 mmol/L   CO2 26 22 - 32 mmol/L   Glucose, Bld 88 70 - 99 mg/dL   BUN 11 8 - 23 mg/dL   Creatinine, Ser 7.82 0.61 - 1.24 mg/dL  Calcium 8.8 (L) 8.9 - 10.3 mg/dL   GFR, Estimated >54 >62 mL/min   Anion gap 7 5 - 15      Assessment &  Plan:   Problem List Items Addressed This Visit   None Visit Diagnoses       Finger infection    -  Primary   Relevant Medications   sulfamethoxazole-trimethoprim (BACTRIM DS) 800-160 MG tablet   mupirocin ointment (BACTROBAN) 2 %   Other Relevant Orders   CT FINGER RIGHT WO CONTRAST     Blister of finger, initial encounter       Relevant Orders   CT FINGER RIGHT WO CONTRAST     Right foot pain       Relevant Orders   DG Foot Complete Right     Bunion of great toe of right foot       Relevant Orders   DG Foot Complete Right     Abscess of finger of right hand       Relevant Orders   CT FINGER RIGHT WO CONTRAST        Right index finger infection Infection likely from blister or callus, spreading with redness. No numbness or significant pain. No drainage noted. Antibiotics prescribed to prevent complications. Possible surgical intervention if antibiotics fail. - Prescribed Bactrim twice daily for seven days. - Prescribed topical antibiotic ointment for right index finger.  Concern for deeper underlying infection vs osteomyelitis, however patient has no active systemic infection symptoms. - Scheduled CT scan of finger for next Thursday afternoon. (Soonest patient is available to arrange transportation, offered to be scheduled TODAY or to go ED today for eval and imaging, patient unable to make it to those locations and he opts to have it arranged for Thursday). - Coordinated with Referral Coordinator to schedule for [facility] Thurs 4/3 at 2pm. - Anticipate referral to Orthopedic specialist pending review of CT results  Right foot bone spur / Great Toe Bunion Area Sensation of foreign body due to bone spur causing discomfort but question if it is just related to bone spur He has had this problem for months now without improvement.  - Referred to podiatrist for evaluation and treatment. - Ordered x-ray of right foot next week STAT, walk in here at  [facility] (we have no X-ray available today) - Provided address and contact information for podiatrist in [place]. Pending referral once review X-ray  Cyst or lymph node swelling in the neck Swelling in neck could be cyst or swollen lymph node. May require surgical attention if persistent. - Consider referral to dermatologist or surgeon if swelling persists or worsens.  Asthma Asthma managed with inhaler. Issue with obtaining rescue inhaler addressed by providing sample. - Provided sample of rescue inhaler AirSupra - Ensured access to necessary inhalers and addressed pharmacy issues.        Also will CC Chart back to [facility] team to see if they can try again to reconnect with patient.  Orders Placed This Encounter  Procedures   CT FINGER RIGHT WO CONTRAST    Standing Status:   Future    Expiration Date:   11/03/2024    Reason for Exam (SYMPTOM  OR DIAGNOSIS REQUIRED):   suspected soft tissue infection, rule out osteomyelitis    Preferred imaging location?:   [facility]   DG Foot Complete Right    Standing Status:   Future    Expiration Date:   11/03/2024    Reason for Exam (SYMPTOM  OR DIAGNOSIS REQUIRED):   medial great toe bunion MTP pain and protrusion, rule out foreign body    Preferred imaging location?:   [facility]    Meds ordered this encounter  Medications   sulfamethoxazole-trimethoprim (BACTRIM DS) 800-160 MG tablet    Sig: Take 1 tablet by mouth 2 (two) times daily for 7 days.    Dispense:  14 tablet    Refill:  0   mupirocin ointment (BACTROBAN) 2 %    Sig: Apply 1 Application topically 2 (two) times daily. For 1-2 weeks until affected area healed    Dispense:  22 g    Refill:  0    Follow up plan: Return if symptoms worsen or fail to improve.   Saralyn Pilar, DO [facility] 11/04/2023, 10:42 AM

## 2023-11-04 NOTE — Patient Instructions (Addendum)
 Thank you for coming to the office today.  X-ray here at [facility] - anytime next week Mon-Tues Preferred, walk in 8am-4pm and X-ray for the foot to evaluate if anything is in there or if it is a bone spur.  Referral to Podiatrist to treat it after X-ray. They will call you with appointment.  [facility] Address: [address], [place], [place] [address] Hours: Open 8AM-5PM Phone: [phone]   For the Finger, I am worried about a skin infection.  I will start you on oral antibiotic Bactrim antibiotic twice a day for 7 days and topical antibiotic ointment.  If it is not improving or worsening, spreading, draining pus, fever chills, or cannot tolerate antibiotic, it is okay to go to Emergency Department for further evaluation.  I have placed order for a STAT CT Scan of this finger to evaluate it further. I am worried about a deeper infection.  If the soonest you can make it somewhere is Thursday Afternoon next week we will schedule it.  After the CT scan, we will contact you and refer you to surgeon who can treat this ifn eeded.  [facility] at [facility] Address: [address] [place], [place] [address] Phone: [phone]  Please schedule a Follow-up Appointment to: Return if symptoms worsen or fail to improve.  If you have any other questions or concerns, please feel free to call the office or send a message through MyChart. You may also schedule an earlier appointment if necessary.  Additionally, you may be receiving a survey about your experience at our office within a few days to 1 week by e-mail or mail. We value your feedback.  Saralyn Pilar, DO [facility], [place]

## 2023-11-07 ENCOUNTER — Ambulatory Visit
Admission: RE | Admit: 2023-11-07 | Discharge: 2023-11-07 | Disposition: A | Discharge status: Home / Self Care | Source: Ambulatory Visit | Attending: Family Medicine | Admitting: Family Medicine

## 2023-11-07 ENCOUNTER — Ambulatory Visit
Admission: RE | Admit: 2023-11-07 | Discharge: 2023-11-07 | Disposition: A | Discharge status: Home / Self Care | Attending: Family Medicine | Admitting: Family Medicine

## 2023-11-07 DIAGNOSIS — M79671 Pain in right foot: Secondary | ICD-10-CM | POA: Insufficient documentation

## 2023-11-07 DIAGNOSIS — M21611 Bunion of right foot: Secondary | ICD-10-CM | POA: Diagnosis not present

## 2023-11-07 DIAGNOSIS — M7661 Achilles tendinitis, right leg: Secondary | ICD-10-CM | POA: Diagnosis not present

## 2023-11-07 DIAGNOSIS — M2011 Hallux valgus (acquired), right foot: Secondary | ICD-10-CM | POA: Diagnosis not present

## 2023-11-08 ENCOUNTER — Other Ambulatory Visit: Payer: Self-pay | Admitting: Family Medicine

## 2023-11-08 ENCOUNTER — Telehealth: Payer: Self-pay

## 2023-11-08 DIAGNOSIS — G8929 Other chronic pain: Secondary | ICD-10-CM

## 2023-11-08 DIAGNOSIS — M2011 Hallux valgus (acquired), right foot: Secondary | ICD-10-CM

## 2023-11-08 NOTE — Patient Outreach (Addendum)
  Care Management    11/08/2023 Name: [name] MRN: [id number] DOB: 10/05/1955  Subjective: David [name] is a 68 y.o. year old male who is a primary care patient of Smitty Cords, DO.  An unsuccessful telephone outreach was attempted today to contact the patient about Care Management needs.     Follow Up Plan:  Unable to leave a voice message due to patient's voice mailbox not being set up. A member of the care team will make additional outreach attempts  Juanell Fairly [facility] RN Care Manager Direct Dial: [phone]  Fax: [phone] Website: [email or web address]

## 2023-11-10 ENCOUNTER — Other Ambulatory Visit: Payer: Self-pay

## 2023-11-10 ENCOUNTER — Ambulatory Visit: Admission: RE | Admit: 2023-11-10 | Source: Ambulatory Visit

## 2023-11-10 DIAGNOSIS — J449 Chronic obstructive pulmonary disease, unspecified: Secondary | ICD-10-CM

## 2023-11-10 DIAGNOSIS — I10 Essential (primary) hypertension: Secondary | ICD-10-CM

## 2023-11-10 NOTE — Patient Outreach (Signed)
  Care Management    11/10/2023 Name: David [name] MRN: [id number] DOB: [birth year]  Subjective: David [name] is a 68 y.o. year old male who is a primary care patient of Smitty Cords, DO.   Encounter created for documentation purposes only. Previously discussed need for care management with primary care provider, however clinic team was unable to establish care. New referral placed for Complex Care Management.    Juanell Fairly [facility] Population Health RN Care Manager Direct Dial: [phone]  Fax: [phone] Website: [email or web address]

## 2023-11-10 NOTE — Patient Outreach (Signed)
 Error Please Disregard

## 2023-11-16 ENCOUNTER — Telehealth: Payer: Self-pay

## 2023-11-16 NOTE — Progress Notes (Signed)
 Complex Care Management Note Care Guide Note  11/16/2023 Name: David [name] MRN: [id number] DOB: 1956-02-07   Complex Care Management Outreach Attempts: An unsuccessful telephone outreach was attempted today to offer the patient information about available complex care management services.  Follow Up Plan:  Additional outreach attempts will be made to offer the patient complex care management information and services.   Encounter Outcome:  No Answer  Penne Lash , RMA     Page  [facility], [facility] Guide  Direct Dial: [phone]  Website: [email or web address]

## 2023-11-30 NOTE — Progress Notes (Unsigned)
 Complex Care Management Note Care Guide Note  11/30/2023 Name: David [name] MRN: [id number] DOB: [birth year]   Complex Care Management Outreach Attempts: An unsuccessful telephone outreach was attempted today to offer the patient information about available complex care management services.  Follow Up Plan:  Additional outreach attempts will be made to offer the patient complex care management information and services.   Encounter Outcome:  No Answer  Barnie Bora  [facility] Health  [facility], [facility] Guide  Direct Dial: [phone]  Fax [phone]

## 2023-12-02 NOTE — Progress Notes (Signed)
 Complex Care Management Note Care Guide Note  12/02/2023 Name: David [name] MRN: [id number] DOB: 11-27-1955   Complex Care Management Outreach Attempts: A third unsuccessful outreach was attempted today to offer the patient with information about available complex care management services.  Follow Up Plan:  No further outreach attempts will be made at this time. We have been unable to contact the patient to offer or enroll patient in complex care management services.  Encounter Outcome:  No Answer  Lenton Rail , RMA     [place]  [facility], [facility] Guide  Direct Dial: [phone]  Website: [email or web address]

## 2023-12-07 ENCOUNTER — Telehealth: Payer: Self-pay

## 2023-12-07 DIAGNOSIS — I739 Peripheral vascular disease, unspecified: Secondary | ICD-10-CM | POA: Insufficient documentation

## 2023-12-07 NOTE — Telephone Encounter (Signed)
  I spoke to Belle Box a [facility] nurse who was doing a home visit today. She stated that she completed a quantaflow test on his lower extremities. The left showed moderate the right showed severe. She said his legs and feet were very cold but no having ay pain.  Recommendations?

## 2023-12-07 NOTE — Telephone Encounter (Addendum)
 Please attempt to contact patient.  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 He would benefit from evaluation from referral to Vascular Specialist if he is interested in seeking treatment for this condition.  We can submit a referral, but patient may be difficult to contact. He has had some difficulty keeping up with following routine appointments.   We have referred to our Care Management team and they have been unsuccessful at contacting him by phone and keeping follow-up appointments as well.  Domingo Friend, DO [facility] 12/07/2023, 7:28 PM

## 2023-12-08 NOTE — Telephone Encounter (Signed)
 Tried calling patient, recording the number is no longer in service.

## 2023-12-08 NOTE — Telephone Encounter (Signed)
 Tried calling the number listed for daughter, the number is no longer in service.

## 2023-12-13 ENCOUNTER — Emergency Department

## 2023-12-13 ENCOUNTER — Inpatient Hospital Stay
Admission: EM | Admit: 2023-12-13 | Discharge: 2023-12-14 | DRG: 521 | Disposition: A | Discharge status: Home Health | Attending: Student | Admitting: Student

## 2023-12-13 ENCOUNTER — Inpatient Hospital Stay: Admitting: Anesthesiology

## 2023-12-13 ENCOUNTER — Ambulatory Visit: Payer: Self-pay

## 2023-12-13 ENCOUNTER — Encounter
Admission: EM | Disposition: A | Discharge status: Home Health | Payer: Self-pay | Source: Home / Self Care | Attending: Internal Medicine

## 2023-12-13 ENCOUNTER — Other Ambulatory Visit: Payer: Self-pay

## 2023-12-13 ENCOUNTER — Inpatient Hospital Stay

## 2023-12-13 DIAGNOSIS — Z681 Body mass index (BMI) 19 or less, adult: Secondary | ICD-10-CM

## 2023-12-13 DIAGNOSIS — C44629 Squamous cell carcinoma of skin of left upper limb, including shoulder: Secondary | ICD-10-CM | POA: Diagnosis present

## 2023-12-13 DIAGNOSIS — M1612 Unilateral primary osteoarthritis, left hip: Secondary | ICD-10-CM | POA: Diagnosis not present

## 2023-12-13 DIAGNOSIS — W19XXXA Unspecified fall, initial encounter: Secondary | ICD-10-CM | POA: Diagnosis not present

## 2023-12-13 DIAGNOSIS — Z85118 Personal history of other malignant neoplasm of bronchus and lung: Secondary | ICD-10-CM | POA: Diagnosis not present

## 2023-12-13 DIAGNOSIS — Z555 Less than a high school diploma: Secondary | ICD-10-CM | POA: Diagnosis not present

## 2023-12-13 DIAGNOSIS — I700 Atherosclerosis of aorta: Secondary | ICD-10-CM | POA: Diagnosis not present

## 2023-12-13 DIAGNOSIS — S72002A Fracture of unspecified part of neck of left femur, initial encounter for closed fracture: Principal | ICD-10-CM | POA: Insufficient documentation

## 2023-12-13 DIAGNOSIS — Z85820 Personal history of malignant melanoma of skin: Secondary | ICD-10-CM | POA: Diagnosis not present

## 2023-12-13 DIAGNOSIS — Z9221 Personal history of antineoplastic chemotherapy: Secondary | ICD-10-CM | POA: Diagnosis not present

## 2023-12-13 DIAGNOSIS — E43 Unspecified severe protein-calorie malnutrition: Secondary | ICD-10-CM | POA: Diagnosis present

## 2023-12-13 DIAGNOSIS — Z5986 Financial insecurity: Secondary | ICD-10-CM | POA: Diagnosis not present

## 2023-12-13 DIAGNOSIS — Z8673 Personal history of transient ischemic attack (TIA), and cerebral infarction without residual deficits: Secondary | ICD-10-CM | POA: Diagnosis not present

## 2023-12-13 DIAGNOSIS — S72009A Fracture of unspecified part of neck of unspecified femur, initial encounter for closed fracture: Secondary | ICD-10-CM | POA: Diagnosis not present

## 2023-12-13 DIAGNOSIS — K219 Gastro-esophageal reflux disease without esophagitis: Secondary | ICD-10-CM | POA: Diagnosis present

## 2023-12-13 DIAGNOSIS — Z043 Encounter for examination and observation following other accident: Secondary | ICD-10-CM | POA: Diagnosis not present

## 2023-12-13 DIAGNOSIS — G894 Chronic pain syndrome: Secondary | ICD-10-CM | POA: Diagnosis present

## 2023-12-13 DIAGNOSIS — J9611 Chronic respiratory failure with hypoxia: Secondary | ICD-10-CM | POA: Diagnosis present

## 2023-12-13 DIAGNOSIS — Z885 Allergy status to narcotic agent status: Secondary | ICD-10-CM

## 2023-12-13 DIAGNOSIS — J432 Centrilobular emphysema: Secondary | ICD-10-CM | POA: Diagnosis present

## 2023-12-13 DIAGNOSIS — Z5982 Transportation insecurity: Secondary | ICD-10-CM | POA: Diagnosis not present

## 2023-12-13 DIAGNOSIS — I10 Essential (primary) hypertension: Secondary | ICD-10-CM | POA: Diagnosis present

## 2023-12-13 DIAGNOSIS — Z8249 Family history of ischemic heart disease and other diseases of the circulatory system: Secondary | ICD-10-CM | POA: Diagnosis not present

## 2023-12-13 DIAGNOSIS — J449 Chronic obstructive pulmonary disease, unspecified: Secondary | ICD-10-CM | POA: Diagnosis present

## 2023-12-13 DIAGNOSIS — Z87442 Personal history of urinary calculi: Secondary | ICD-10-CM

## 2023-12-13 DIAGNOSIS — R9082 White matter disease, unspecified: Secondary | ICD-10-CM | POA: Diagnosis not present

## 2023-12-13 DIAGNOSIS — Z7951 Long term (current) use of inhaled steroids: Secondary | ICD-10-CM

## 2023-12-13 DIAGNOSIS — W109XXA Fall (on) (from) unspecified stairs and steps, initial encounter: Secondary | ICD-10-CM | POA: Diagnosis present

## 2023-12-13 DIAGNOSIS — J4489 Other specified chronic obstructive pulmonary disease: Secondary | ICD-10-CM | POA: Diagnosis present

## 2023-12-13 DIAGNOSIS — Z801 Family history of malignant neoplasm of trachea, bronchus and lung: Secondary | ICD-10-CM

## 2023-12-13 DIAGNOSIS — K5730 Diverticulosis of large intestine without perforation or abscess without bleeding: Secondary | ICD-10-CM | POA: Diagnosis not present

## 2023-12-13 DIAGNOSIS — Z923 Personal history of irradiation: Secondary | ICD-10-CM

## 2023-12-13 DIAGNOSIS — M25552 Pain in left hip: Secondary | ICD-10-CM | POA: Diagnosis not present

## 2023-12-13 DIAGNOSIS — M1990 Unspecified osteoarthritis, unspecified site: Secondary | ICD-10-CM | POA: Diagnosis present

## 2023-12-13 DIAGNOSIS — Z9889 Other specified postprocedural states: Secondary | ICD-10-CM | POA: Diagnosis not present

## 2023-12-13 DIAGNOSIS — F17210 Nicotine dependence, cigarettes, uncomplicated: Secondary | ICD-10-CM | POA: Diagnosis present

## 2023-12-13 HISTORY — PX: HIP ARTHROPLASTY: SHX981

## 2023-12-13 LAB — BASIC METABOLIC PANEL WITH GFR
Anion gap: 9 (ref 5–15)
BUN: 10 mg/dL (ref 8–23)
CO2: 26 mmol/L (ref 22–32)
Calcium: 8.7 mg/dL — ABNORMAL LOW (ref 8.9–10.3)
Chloride: 103 mmol/L (ref 98–111)
Creatinine, Ser: 0.78 mg/dL (ref 0.61–1.24)
GFR, Estimated: >60 mL/min (ref >60)
Glucose, Bld: 73 mg/dL (ref 70–99)
Potassium: 3.7 mmol/L (ref 3.5–5.1)
Sodium: 138 mmol/L (ref 135–145)

## 2023-12-13 LAB — CBC WITH DIFFERENTIAL/PLATELET
Abs Immature Granulocytes: 0.04 10*3/uL (ref 0.00–0.07)
Basophils Absolute: 0 10*3/uL (ref 0.0–0.1)
Basophils Relative: 1 %
Eosinophils Absolute: 0.3 10*3/uL (ref 0.0–0.5)
Eosinophils Relative: 5 %
HCT: 34.6 % — ABNORMAL LOW (ref 39.0–52.0)
Hemoglobin: 11.3 g/dL — ABNORMAL LOW (ref 13.0–17.0)
Immature Granulocytes: 1 %
Lymphocytes Relative: 22 %
Lymphs Abs: 1.3 10*3/uL (ref 0.7–4.0)
MCH: 29.3 pg (ref 26.0–34.0)
MCHC: 32.7 g/dL (ref 30.0–36.0)
MCV: 89.6 fL (ref 80.0–100.0)
Monocytes Absolute: 0.5 10*3/uL (ref 0.1–1.0)
Monocytes Relative: 9 %
Neutro Abs: 3.7 10*3/uL (ref 1.7–7.7)
Neutrophils Relative %: 62 %
Platelets: 233 10*3/uL (ref 150–400)
RBC: 3.86 MIL/uL — ABNORMAL LOW (ref 4.22–5.81)
RDW: 13.8 % (ref 11.5–15.5)
WBC: 5.8 10*3/uL (ref 4.0–10.5)
nRBC: 0 % (ref 0.0–0.2)

## 2023-12-13 LAB — PROTIME-INR
INR: 1 (ref 0.8–1.2)
Prothrombin Time: 13 s (ref 11.4–15.2)

## 2023-12-13 LAB — HIV ANTIBODY (ROUTINE TESTING W REFLEX): HIV Screen 4th Generation wRfx: NONREACTIVE

## 2023-12-13 LAB — VITAMIN D 25 HYDROXY (VIT D DEFICIENCY, FRACTURES): Vit D, 25-Hydroxy: 29.99 ng/mL — ABNORMAL LOW (ref 30–100)

## 2023-12-13 LAB — APTT: aPTT: 35 s (ref 24–36)

## 2023-12-13 LAB — TYPE AND SCREEN
ABO/RH(D): A POS
Antibody Screen: NEGATIVE

## 2023-12-13 SURGERY — HEMIARTHROPLASTY (BIPOLAR) HIP, POSTERIOR APPROACH FOR FRACTURE
Anesthesia: Spinal | Site: Hip | Laterality: Left

## 2023-12-13 MED ORDER — MIDAZOLAM HCL 5 MG/5ML IJ SOLN
INTRAMUSCULAR | Status: DC | PRN
  Administered 2023-12-13: 1 mg via INTRAVENOUS

## 2023-12-13 MED ORDER — PROPOFOL 500 MG/50ML IV EMUL
INTRAVENOUS | Status: DC | PRN
  Administered 2023-12-13: 30 mg via INTRAVENOUS
  Administered 2023-12-13: 80 ug/kg/min via INTRAVENOUS

## 2023-12-13 MED ORDER — KETOROLAC TROMETHAMINE 15 MG/ML IJ SOLN
7.5000 mg | Freq: Four times a day (QID) | INTRAMUSCULAR | Status: AC
  Administered 2023-12-13 – 2023-12-14 (×4): 7.5 mg via INTRAVENOUS
  Filled 2023-12-13 (×3): qty 1

## 2023-12-13 MED ORDER — DEXAMETHASONE SODIUM PHOSPHATE 10 MG/ML IJ SOLN
INTRAMUSCULAR | Status: DC | PRN
Start: 2023-12-13 — End: 2023-12-13
  Administered 2023-12-13: 8 mg via INTRAVENOUS

## 2023-12-13 MED ORDER — CEFAZOLIN SODIUM-DEXTROSE 2-4 GM/100ML-% IV SOLN
INTRAVENOUS | Status: AC
  Filled 2023-12-13: qty 100

## 2023-12-13 MED ORDER — TRANEXAMIC ACID-NACL 1000-0.7 MG/100ML-% IV SOLN
INTRAVENOUS | Status: AC
  Filled 2023-12-13: qty 100

## 2023-12-13 MED ORDER — 0.9 % SODIUM CHLORIDE (POUR BTL) OPTIME
TOPICAL | Status: DC | PRN
Start: 2023-12-13 — End: 2023-12-13
  Administered 2023-12-13: 500 mL

## 2023-12-13 MED ORDER — ALBUMIN HUMAN 5 % IV SOLN: INTRAVENOUS | Status: DC | PRN

## 2023-12-13 MED ORDER — DOCUSATE SODIUM 100 MG PO CAPS
100.0000 mg | ORAL_CAPSULE | Freq: Two times a day (BID) | ORAL | Status: DC
  Administered 2023-12-13 – 2023-12-14 (×2): 100 mg via ORAL
  Filled 2023-12-13 (×2): qty 1

## 2023-12-13 MED ORDER — ASPIRIN 81 MG PO TBEC
81.0000 mg | DELAYED_RELEASE_TABLET | Freq: Every day | ORAL | Status: DC
  Administered 2023-12-14: 81 mg via ORAL
  Filled 2023-12-13: qty 1

## 2023-12-13 MED ORDER — METOPROLOL TARTRATE 25 MG PO TABS
12.5000 mg | ORAL_TABLET | Freq: Two times a day (BID) | ORAL | Status: DC
Start: 2023-12-13 — End: 2023-12-14
  Administered 2023-12-13: 12.5 mg via ORAL
  Filled 2023-12-13 (×2): qty 1

## 2023-12-13 MED ORDER — METHOCARBAMOL 500 MG PO TABS
500.0000 mg | ORAL_TABLET | Freq: Once | ORAL | Status: DC
  Filled 2023-12-13: qty 1

## 2023-12-13 MED ORDER — BUPIVACAINE-EPINEPHRINE (PF) 0.25% -1:200000 IJ SOLN
INTRAMUSCULAR | Status: AC
  Filled 2023-12-13: qty 30

## 2023-12-13 MED ORDER — MENTHOL 3 MG MT LOZG: 1.0000 | LOZENGE | OROMUCOSAL | Status: DC | PRN

## 2023-12-13 MED ORDER — KETOROLAC TROMETHAMINE 15 MG/ML IJ SOLN
INTRAMUSCULAR | Status: AC
  Filled 2023-12-13: qty 1

## 2023-12-13 MED ORDER — OXYCODONE HCL 5 MG PO TABS: 5.0000 mg | ORAL_TABLET | Freq: Once | ORAL | Status: DC | PRN

## 2023-12-13 MED ORDER — PROPOFOL 1000 MG/100ML IV EMUL
INTRAVENOUS | Status: AC
  Filled 2023-12-13: qty 100

## 2023-12-13 MED ORDER — PANTOPRAZOLE SODIUM 40 MG PO TBEC
40.0000 mg | DELAYED_RELEASE_TABLET | Freq: Every day | ORAL | Status: DC
  Administered 2023-12-14: 40 mg via ORAL
  Filled 2023-12-13: qty 1

## 2023-12-13 MED ORDER — HYDROMORPHONE HCL 1 MG/ML IJ SOLN
0.5000 mg | INTRAMUSCULAR | Status: DC | PRN
  Administered 2023-12-13: 0.5 mg via INTRAVENOUS
  Filled 2023-12-13: qty 0.5

## 2023-12-13 MED ORDER — BUDESON-GLYCOPYRROL-FORMOTEROL 160-9-4.8 MCG/ACT IN AERO: 2.0000 | INHALATION_SPRAY | Freq: Two times a day (BID) | RESPIRATORY_TRACT | Status: DC

## 2023-12-13 MED ORDER — SODIUM CHLORIDE (PF) 0.9 % IJ SOLN
INTRAMUSCULAR | Status: AC
  Filled 2023-12-13: qty 10

## 2023-12-13 MED ORDER — LIDOCAINE HCL (CARDIAC) PF 50 MG/5ML IV SOSY
PREFILLED_SYRINGE | INTRAVENOUS | Status: DC | PRN
  Administered 2023-12-13: 3 mL via INTRAVENOUS

## 2023-12-13 MED ORDER — PHENOL 1.4 % MT LIQD: 1.0000 | OROMUCOSAL | Status: DC | PRN

## 2023-12-13 MED ORDER — SODIUM CHLORIDE (PF) 0.9 % IJ SOLN
INTRAMUSCULAR | Status: DC | PRN
  Administered 2023-12-13: 50 mL via INTRAMUSCULAR

## 2023-12-13 MED ORDER — BUPIVACAINE LIPOSOME 1.3 % IJ SUSP
INTRAMUSCULAR | Status: AC
  Filled 2023-12-13: qty 20

## 2023-12-13 MED ORDER — ONDANSETRON HCL 4 MG PO TABS: 4.0000 mg | ORAL_TABLET | Freq: Four times a day (QID) | ORAL | Status: DC | PRN

## 2023-12-13 MED ORDER — MIRTAZAPINE 15 MG PO TABS: 15.0000 mg | ORAL_TABLET | Freq: Every day | ORAL | Status: DC

## 2023-12-13 MED ORDER — PHENYLEPHRINE 80 MCG/ML (10ML) SYRINGE FOR IV PUSH (FOR BLOOD PRESSURE SUPPORT)
PREFILLED_SYRINGE | INTRAVENOUS | Status: DC | PRN
Start: 2023-12-13 — End: 2023-12-13
  Administered 2023-12-13: 80 ug via INTRAVENOUS
  Administered 2023-12-13: 40 ug via INTRAVENOUS
  Administered 2023-12-13 (×2): 80 ug via INTRAVENOUS
  Administered 2023-12-13 (×3): 40 ug via INTRAVENOUS

## 2023-12-13 MED ORDER — LIDOCAINE 5 % EX PTCH
1.0000 | MEDICATED_PATCH | CUTANEOUS | Status: DC
  Administered 2023-12-13 – 2023-12-14 (×2): 1 via TRANSDERMAL
  Filled 2023-12-13 (×2): qty 1

## 2023-12-13 MED ORDER — OXYCODONE HCL 5 MG/5ML PO SOLN: 5.0000 mg | Freq: Once | ORAL | Status: DC | PRN

## 2023-12-13 MED ORDER — ALBUMIN HUMAN 5 % IV SOLN
INTRAVENOUS | Status: AC
  Filled 2023-12-13: qty 250

## 2023-12-13 MED ORDER — PREDNISONE 20 MG PO TABS: 40.0000 mg | ORAL_TABLET | Freq: Every day | ORAL | Status: DC

## 2023-12-13 MED ORDER — CEFAZOLIN SODIUM-DEXTROSE 2-3 GM-%(50ML) IV SOLR
INTRAVENOUS | Status: DC | PRN
Start: 2023-12-13 — End: 2023-12-13
  Administered 2023-12-13: 2 g via INTRAVENOUS

## 2023-12-13 MED ORDER — ACETAMINOPHEN 500 MG PO TABS
1000.0000 mg | ORAL_TABLET | Freq: Once | ORAL | Status: DC
  Filled 2023-12-13: qty 2

## 2023-12-13 MED ORDER — TRANEXAMIC ACID-NACL 1000-0.7 MG/100ML-% IV SOLN
INTRAVENOUS | Status: DC | PRN
  Administered 2023-12-13 (×2): 1000 mg via INTRAVENOUS

## 2023-12-13 MED ORDER — DROPERIDOL 2.5 MG/ML IJ SOLN: 0.6250 mg | Freq: Once | INTRAMUSCULAR | Status: DC | PRN

## 2023-12-13 MED ORDER — SODIUM CHLORIDE 0.9 % IR SOLN
Status: DC | PRN
Start: 2023-12-13 — End: 2023-12-13
  Administered 2023-12-13: 3000 mL
  Administered 2023-12-13: 100 mL

## 2023-12-13 MED ORDER — PROPOFOL 10 MG/ML IV BOLUS
INTRAVENOUS | Status: DC | PRN
Start: 2023-12-13 — End: 2023-12-13

## 2023-12-13 MED ORDER — METOCLOPRAMIDE HCL 5 MG/ML IJ SOLN: 5.0000 mg | Freq: Three times a day (TID) | INTRAMUSCULAR | Status: DC | PRN

## 2023-12-13 MED ORDER — ONDANSETRON HCL 4 MG/2ML IJ SOLN: 4.0000 mg | Freq: Four times a day (QID) | INTRAMUSCULAR | Status: DC | PRN

## 2023-12-13 MED ORDER — EPHEDRINE SULFATE-NACL 50-0.9 MG/10ML-% IV SOSY
PREFILLED_SYRINGE | INTRAVENOUS | Status: DC | PRN
  Administered 2023-12-13 (×2): 5 mg via INTRAVENOUS
  Administered 2023-12-13: 2.5 mg via INTRAVENOUS
  Administered 2023-12-13: 5 mg via INTRAVENOUS
  Administered 2023-12-13: 2.5 mg via INTRAVENOUS

## 2023-12-13 MED ORDER — CEFAZOLIN SODIUM-DEXTROSE 2-4 GM/100ML-% IV SOLN
2.0000 g | Freq: Four times a day (QID) | INTRAVENOUS | Status: AC
  Administered 2023-12-13 – 2023-12-14 (×2): 2 g via INTRAVENOUS
  Filled 2023-12-13 (×2): qty 100

## 2023-12-13 MED ORDER — OXYCODONE HCL 5 MG PO TABS
5.0000 mg | ORAL_TABLET | ORAL | Status: DC | PRN
  Administered 2023-12-14: 5 mg via ORAL
  Filled 2023-12-13 (×2): qty 1

## 2023-12-13 MED ORDER — SURGIPHOR WOUND IRRIGATION SYSTEM - OPTIME: TOPICAL | Status: DC | PRN

## 2023-12-13 MED ORDER — BUPIVACAINE HCL (PF) 0.5 % IJ SOLN
INTRAMUSCULAR | Status: DC | PRN
  Administered 2023-12-13: 3 mL

## 2023-12-13 MED ORDER — ALBUTEROL SULFATE (2.5 MG/3ML) 0.083% IN NEBU
2.5000 mg | INHALATION_SOLUTION | RESPIRATORY_TRACT | Status: DC | PRN
Start: 2023-12-13 — End: 2023-12-14

## 2023-12-13 MED ORDER — ENOXAPARIN SODIUM 40 MG/0.4ML IJ SOSY
40.0000 mg | PREFILLED_SYRINGE | INTRAMUSCULAR | Status: DC
  Administered 2023-12-14: 40 mg via SUBCUTANEOUS
  Filled 2023-12-13: qty 0.4

## 2023-12-13 MED ORDER — GUAIFENESIN ER 600 MG PO TB12
1200.0000 mg | ORAL_TABLET | Freq: Two times a day (BID) | ORAL | Status: DC
  Administered 2023-12-13 – 2023-12-14 (×2): 1200 mg via ORAL
  Filled 2023-12-13 (×2): qty 2

## 2023-12-13 MED ORDER — ENSURE ENLIVE PO LIQD
237.0000 mL | Freq: Three times a day (TID) | ORAL | Status: DC
  Administered 2023-12-14: 237 mL via ORAL

## 2023-12-13 MED ORDER — ACETAMINOPHEN 10 MG/ML IV SOLN: 1000.0000 mg | Freq: Once | INTRAVENOUS | Status: DC | PRN

## 2023-12-13 MED ORDER — FENTANYL CITRATE (PF) 100 MCG/2ML IJ SOLN
INTRAMUSCULAR | Status: AC
  Filled 2023-12-13: qty 2

## 2023-12-13 MED ORDER — MORPHINE SULFATE (PF) 4 MG/ML IV SOLN
4.0000 mg | Freq: Once | INTRAVENOUS | Status: AC
  Administered 2023-12-13: 4 mg via INTRAVENOUS
  Filled 2023-12-13: qty 1

## 2023-12-13 MED ORDER — TRAMADOL HCL 50 MG PO TABS
50.0000 mg | ORAL_TABLET | Freq: Four times a day (QID) | ORAL | Status: DC | PRN
  Administered 2023-12-14: 50 mg via ORAL
  Filled 2023-12-13: qty 1

## 2023-12-13 MED ORDER — IPRATROPIUM-ALBUTEROL 0.5-2.5 (3) MG/3ML IN SOLN
3.0000 mL | Freq: Four times a day (QID) | RESPIRATORY_TRACT | Status: DC
  Administered 2023-12-13 – 2023-12-14 (×3): 3 mL via RESPIRATORY_TRACT
  Filled 2023-12-13 (×3): qty 3

## 2023-12-13 MED ORDER — BISACODYL 5 MG PO TBEC: 5.0000 mg | DELAYED_RELEASE_TABLET | Freq: Every day | ORAL | Status: DC | PRN

## 2023-12-13 MED ORDER — ENOXAPARIN SODIUM 40 MG/0.4ML IJ SOSY: 40.0000 mg | PREFILLED_SYRINGE | INTRAMUSCULAR | Status: DC

## 2023-12-13 MED ORDER — FENTANYL CITRATE (PF) 100 MCG/2ML IJ SOLN: 25.0000 ug | INTRAMUSCULAR | Status: DC | PRN

## 2023-12-13 MED ORDER — LACTATED RINGERS IV SOLN: INTRAVENOUS | Status: DC | PRN

## 2023-12-13 MED ORDER — PHENYLEPHRINE 80 MCG/ML (10ML) SYRINGE FOR IV PUSH (FOR BLOOD PRESSURE SUPPORT)
PREFILLED_SYRINGE | INTRAVENOUS | Status: AC
  Filled 2023-12-13: qty 10

## 2023-12-13 MED ORDER — EPHEDRINE 5 MG/ML INJ
INTRAVENOUS | Status: AC
  Filled 2023-12-13: qty 5

## 2023-12-13 MED ORDER — DEXAMETHASONE SODIUM PHOSPHATE 10 MG/ML IJ SOLN
INTRAMUSCULAR | Status: AC
  Filled 2023-12-13: qty 1

## 2023-12-13 MED ORDER — SENNOSIDES-DOCUSATE SODIUM 8.6-50 MG PO TABS: 1.0000 | ORAL_TABLET | Freq: Every evening | ORAL | Status: DC | PRN

## 2023-12-13 MED ORDER — ACETAMINOPHEN 10 MG/ML IV SOLN
INTRAVENOUS | Status: AC
  Filled 2023-12-13: qty 100

## 2023-12-13 MED ORDER — MIDAZOLAM HCL 2 MG/2ML IJ SOLN
INTRAMUSCULAR | Status: AC
  Filled 2023-12-13: qty 2

## 2023-12-13 MED ORDER — METOCLOPRAMIDE HCL 5 MG PO TABS: 5.0000 mg | ORAL_TABLET | Freq: Three times a day (TID) | ORAL | Status: DC | PRN

## 2023-12-13 MED ORDER — ADULT MULTIVITAMIN W/MINERALS CH
1.0000 | ORAL_TABLET | Freq: Every day | ORAL | Status: DC
  Administered 2023-12-14: 1 via ORAL
  Filled 2023-12-13: qty 1

## 2023-12-13 SURGICAL SUPPLY — 69 items
BLADE SAGITTAL AGGR TOOTH XLG (BLADE) ×1 IMPLANT
BNDG COHESIVE 6X5 TAN ST LF (GAUZE/BANDAGES/DRESSINGS) ×1 IMPLANT
BOWL CEMENT MIXING ADV NOZZLE (MISCELLANEOUS) IMPLANT
CEMENT BONE 10-PACK (Cement) IMPLANT
CHLORAPREP W/TINT 26 (MISCELLANEOUS) ×2 IMPLANT
COVER SET STULBERG POSITIONER (MISCELLANEOUS) ×1 IMPLANT
DERMABOND ADVANCED .7 DNX12 (GAUZE/BANDAGES/DRESSINGS) ×1 IMPLANT
DRAPE IMP U-DRAPE 54X76 (DRAPES) ×1 IMPLANT
DRAPE INCISE IOBAN 66X60 STRL (DRAPES) ×1 IMPLANT
DRAPE POUCH INSTRU U-SHP 10X18 (DRAPES) ×1 IMPLANT
DRAPE SHEET LG 3/4 BI-LAMINATE (DRAPES) ×1 IMPLANT
DRAPE TABLE BACK 80X90 (DRAPES) ×1 IMPLANT
DRAPE U-SHAPE 47X51 STRL (DRAPES) ×1 IMPLANT
DRSG MEPILEX SACRM 8.7X9.8 (GAUZE/BANDAGES/DRESSINGS) ×1 IMPLANT
DRSG OPSITE POSTOP 4X10 (GAUZE/BANDAGES/DRESSINGS) IMPLANT
DRSG OPSITE POSTOP 4X12 (GAUZE/BANDAGES/DRESSINGS) IMPLANT
DRSG OPSITE POSTOP 4X8 (GAUZE/BANDAGES/DRESSINGS) IMPLANT
ELECTRODE REM PT RTRN 9FT ADLT (ELECTROSURGICAL) ×1 IMPLANT
GLOVE BIO SURGEON STRL SZ8 (GLOVE) ×1 IMPLANT
GLOVE BIOGEL PI IND STRL 8 (GLOVE) ×1 IMPLANT
GLOVE PI ORTHO PRO STRL 7.5 (GLOVE) ×2 IMPLANT
GLOVE PI ORTHO PRO STRL SZ8 (GLOVE) ×2 IMPLANT
GLOVE SURG SYN 7.5 E (GLOVE) ×2 IMPLANT
GLOVE SURG SYN 7.5 PF PI (GLOVE) ×2 IMPLANT
GOWN SRG XL LVL 3 NONREINFORCE (GOWNS) ×1 IMPLANT
GOWN STRL REUS W/ TWL LRG LVL3 (GOWN DISPOSABLE) ×1 IMPLANT
GOWN STRL REUS W/ TWL XL LVL3 (GOWN DISPOSABLE) ×1 IMPLANT
HANDLE YANKAUER SUCT OPEN TIP (MISCELLANEOUS) ×1 IMPLANT
HEAD BIPOLAR LOCK UHR 28X52 (Head) IMPLANT
HEAD CERAMIC V40 BIOLOX DEL 28 (Orthopedic Implant) IMPLANT
HOOD PEEL AWAY T7 (MISCELLANEOUS) ×2 IMPLANT
IV NS 100ML SINGLE PACK (IV SOLUTION) ×1 IMPLANT
KIT PREP HIP W/CEMENT RESTRICT (Miscellaneous) IMPLANT
KIT TURNOVER KIT A (KITS) ×1 IMPLANT
MANIFOLD NEPTUNE II (INSTRUMENTS) ×1 IMPLANT
MAT ABSORB FLUID 56X50 GRAY (MISCELLANEOUS) ×1 IMPLANT
NDL FILTER BLUNT 18X1 1/2 (NEEDLE) ×1 IMPLANT
NDL HYPO 22X1.5 SAFETY MO (MISCELLANEOUS) ×1 IMPLANT
NDL SPNL 20GX3.5 QUINCKE YW (NEEDLE) ×1 IMPLANT
NEEDLE FILTER BLUNT 18X1 1/2 (NEEDLE) ×1 IMPLANT
NEEDLE HYPO 22X1.5 SAFETY MO (MISCELLANEOUS) ×1 IMPLANT
NEEDLE SPNL 20GX3.5 QUINCKE YW (NEEDLE) ×1 IMPLANT
PACK HIP PROSTHESIS (MISCELLANEOUS) ×1 IMPLANT
PENCIL SMOKE EVACUATOR (MISCELLANEOUS) ×1 IMPLANT
PILLOW ABDUCTION FOAM SM (MISCELLANEOUS) ×1 IMPLANT
RETRIEVER SUT HEWSON (MISCELLANEOUS) ×1 IMPLANT
SLEEVE SCD COMPRESS KNEE MED (STOCKING) ×1 IMPLANT
SOL .9 NS 3000ML IRR UROMATIC (IV SOLUTION) ×1 IMPLANT
SOLUTION IRRIG SURGIPHOR (IV SOLUTION) ×1 IMPLANT
SPACER CENTRAL ACCOLADE 4/5X14 (Spacer) IMPLANT
STEM HIP ACCOLADE 5X37 132D (Stem) IMPLANT
SUT BONE WAX W31G (SUTURE) ×1 IMPLANT
SUT ETHIBOND #5 BRAIDED 30INL (SUTURE) ×1 IMPLANT
SUT STRATAFIX 14 PDO 36 VLT (SUTURE) ×1 IMPLANT
SUT STRATAFIX PDO 1 14 VIOLET (SUTURE) ×1 IMPLANT
SUT VIC AB 0 CT1 36 (SUTURE) ×1 IMPLANT
SUT VIC AB 2-0 CT2 27 (SUTURE) ×2 IMPLANT
SUTURE STRATA 1 CT-1 DLB (SUTURE) ×1 IMPLANT
SUTURE STRATA SPIR 4-0 18 (SUTURE) ×1 IMPLANT
SUTURE VICRYL 1-0 27IN ABS (SUTURE) ×1 IMPLANT
SYR 20ML LL LF (SYRINGE) ×2 IMPLANT
SYR TB 1ML LL NO SAFETY (SYRINGE) ×1 IMPLANT
TIP FAN IRRIG PULSAVAC PLUS (DISPOSABLE) ×1 IMPLANT
TIP IRRIG/SUCT HIGH CAPACITY (MISCELLANEOUS) IMPLANT
TOWEL OR 17X26 4PK STRL BLUE (TOWEL DISPOSABLE) IMPLANT
TRAP FLUID SMOKE EVACUATOR (MISCELLANEOUS) ×1 IMPLANT
TRAY FOLEY SLVR 16FR LF STAT (SET/KITS/TRAYS/PACK) ×1 IMPLANT
WAND WEREWOLF FASTSEAL 6.0 (MISCELLANEOUS) ×1 IMPLANT
WATER STERILE IRR 1000ML POUR (IV SOLUTION) ×1 IMPLANT

## 2023-12-13 NOTE — ED Provider Notes (Signed)
 [facility] Provider Note    Event Date/Time   First MD Initiated Contact with Patient 12/13/23 1002     (approximate)   History   Fall and Hip Pain   HPI  David [name] is a 68 y.o. male who presents to the ED for evaluation of Fall and Hip Pain   I review PCP visit from 3/28.  History of chronic pain syndrome, gout, COPD, protein calorie malnutrition.  No anticoagulation.  Patient presents to the ED with left hip pain after a fall that occurred this past Friday.  Reports of mechanical fall landing directly on his left hip and left side of his head.  Has been using a walker to get around since then but still can hardly ambulate.   Physical Exam   Triage Vital Signs: ED Triage Vitals  Encounter Vitals Group     BP 12/13/23 0940 103/71     Systolic BP Percentile --      Diastolic BP Percentile --      Pulse Rate 12/13/23 0940 95     Resp 12/13/23 0939 18     Temp 12/13/23 0939 97.9 F (36.6 C)     Temp Source 12/13/23 0939 Oral     SpO2 12/13/23 0940 100 %     Weight 12/13/23 0940 106 lb 0.7 oz (48.1 kg)     Height 12/13/23 0940 5\' 9"  (1.753 m)     Head Circumference --      Peak Flow --      Pain Score --      Pain Loc --      Pain Education --      Exclude from Growth Chart --     Most recent vital signs: Vitals:   12/13/23 0940 12/13/23 1038  BP: 103/71   Pulse: 95   Resp:    Temp:    SpO2: 100% 93%    General: Awake, no distress.  CV:  Good peripheral perfusion.  Resp:  Normal effort.  Abd:  No distention.  MSK:  No deformity noted.  No evidence of open injury.  Keeping left leg flexed at the hip preferentially. Neuro:  No focal deficits appreciated. Other:     ED Results / Procedures / Treatments   Labs (all labs ordered are listed, but only abnormal results are displayed) Labs Reviewed  CBC WITH DIFFERENTIAL/PLATELET  BASIC METABOLIC PANEL WITH GFR  PROTIME-INR  APTT  TYPE AND SCREEN    EKG Treatment is  baseline.  Sinus rhythm with a rate of 97 bpm.  Right superior axis.  No clear signs of acute ischemia.  RADIOLOGY CT head interpreted by me without evidence of acute intracranial pathology CT cervical spine interpreted by me without evidence of fracture or dislocation Plain film of the pelvis and left hip interpreted by me with an impacted left femoral neck fracture  Official radiology report(s): DG Hip Unilat W or Wo Pelvis 2-3 Views Left Result Date: 12/13/2023 CLINICAL DATA:  Left hip pain after fall several days ago. EXAM: DG HIP (WITH OR WITHOUT PELVIS) 2-3V LEFT COMPARISON:  October 05, 2021. FINDINGS: Mildly displaced proximal left femoral neck fracture is noted. IMPRESSION: Mildly displaced proximal left femoral neck fracture. Electronically Signed   By: Rosalene Colon M.D.   On: 12/13/2023 10:48   CT Head Wo Contrast Result Date: 12/13/2023 CLINICAL DATA:  Fall EXAM: CT HEAD WITHOUT CONTRAST CT CERVICAL SPINE WITHOUT CONTRAST TECHNIQUE: Multidetector CT imaging of the head  and cervical spine was performed following the standard protocol without intravenous contrast. Multiplanar CT image reconstructions of the cervical spine were also generated. RADIATION DOSE REDUCTION: This exam was performed according to the departmental dose-optimization program which includes automated exposure control, adjustment of the mA and/or kV according to patient size and/or use of iterative reconstruction technique. COMPARISON:  04/20/2022 FINDINGS: CT HEAD FINDINGS Brain: No evidence of acute infarction, hemorrhage, hydrocephalus, extra-axial collection or mass lesion/mass effect. Periventricular white matter hypodensity. Vascular: No hyperdense vessel or unexpected calcification. Skull: Normal. Negative for fracture or focal lesion. Sinuses/Orbits: No acute finding. Other: None. CT CERVICAL SPINE FINDINGS Alignment: Degenerative straightening of the normal cervical lordosis. Skull base and vertebrae: No acute  fracture. No primary bone lesion or focal pathologic process. Soft tissues and spinal canal: No prevertebral fluid or swelling. No visible canal hematoma. Disc levels: Focally moderate disc degenerative disease and osteophytosis of C6-C7 with otherwise minimal disc degenerative change. Upper chest: Negative. Other: None. IMPRESSION: 1. No acute intracranial pathology. Small-vessel white matter disease. 2. No fracture or static subluxation of the cervical spine. 3. Focally moderate disc degenerative disease and osteophytosis of C6-C7 with otherwise minimal disc degenerative change. Electronically Signed   By: Fredricka Jenny M.D.   On: 12/13/2023 10:18   CT Cervical Spine Wo Contrast Result Date: 12/13/2023 CLINICAL DATA:  Fall EXAM: CT HEAD WITHOUT CONTRAST CT CERVICAL SPINE WITHOUT CONTRAST TECHNIQUE: Multidetector CT imaging of the head and cervical spine was performed following the standard protocol without intravenous contrast. Multiplanar CT image reconstructions of the cervical spine were also generated. RADIATION DOSE REDUCTION: This exam was performed according to the departmental dose-optimization program which includes automated exposure control, adjustment of the mA and/or kV according to patient size and/or use of iterative reconstruction technique. COMPARISON:  04/20/2022 FINDINGS: CT HEAD FINDINGS Brain: No evidence of acute infarction, hemorrhage, hydrocephalus, extra-axial collection or mass lesion/mass effect. Periventricular white matter hypodensity. Vascular: No hyperdense vessel or unexpected calcification. Skull: Normal. Negative for fracture or focal lesion. Sinuses/Orbits: No acute finding. Other: None. CT CERVICAL SPINE FINDINGS Alignment: Degenerative straightening of the normal cervical lordosis. Skull base and vertebrae: No acute fracture. No primary bone lesion or focal pathologic process. Soft tissues and spinal canal: No prevertebral fluid or swelling. No visible canal hematoma. Disc  levels: Focally moderate disc degenerative disease and osteophytosis of C6-C7 with otherwise minimal disc degenerative change. Upper chest: Negative. Other: None. IMPRESSION: 1. No acute intracranial pathology. Small-vessel white matter disease. 2. No fracture or static subluxation of the cervical spine. 3. Focally moderate disc degenerative disease and osteophytosis of C6-C7 with otherwise minimal disc degenerative change. Electronically Signed   By: Fredricka Jenny M.D.   On: 12/13/2023 10:18    PROCEDURES and INTERVENTIONS:  Procedures  Medications  lidocaine  (LIDODERM ) 5 % 1 patch (has no administration in time range)  morphine  (PF) 4 MG/ML injection 4 mg (has no administration in time range)     IMPRESSION / MDM / ASSESSMENT AND PLAN / ED COURSE  I reviewed the triage vital signs and the nursing notes.  Differential diagnosis includes, but is not limited to, pelvic ring fracture, hip fracture, muscular pain, ICH, skull fracture  {Patient presents with symptoms of an acute illness or injury that is potentially life-threatening.  Patient presents with left hip pain after mechanical fall with signs of an impacted femoral neck fracture requiring medical admission for orthopedic fixation.  Preoperative blood work sent.  Clinical Course as of 12/13/23 1108  Tue Dec 13, 2023  1058 Reassessed and discussed x-ray results, hip fracture.  Have secretary page orthopedics.  He is n.p.o. [DS]    Clinical Course User Index [DS] Arline Bennett, MD     FINAL CLINICAL IMPRESSION(S) / ED DIAGNOSES   Final diagnoses:  Fall, initial encounter  Left hip pain  Left displaced femoral neck fracture (HCC)     Rx / DC Orders   ED Discharge Orders     None        Note:  This document was prepared using Dragon voice recognition software and may include unintentional dictation errors.   Arline Bennett, MD 12/13/23 [phone]

## 2023-12-13 NOTE — Op Note (Signed)
 Patient Name: David [name]  ZOX:[id number]  Pre-Operative Diagnosis: Left displaced femoral neck fracture  Post-Operative Diagnosis: (same)  Procedure: Left hip cemented hemiarthroplasty  Components/Implants:  Stem: Accolade C 132 deg Size #5 w/ 14mm distal spacer  Head: Biolox ceramic 28+0 inner ball, UHR bioplar 52/28  Date of Surgery: 12/13/2023  Surgeon: Venus Ginsberg MD  Assistant: Georgette Kins RNFA (present and scrubbed throughout the case, critical for assistance with exposure, retraction, instrumentation, and closure)   Anesthesiologist: Mazzoni  Anesthesia: Spinal   IVF:1000cc  EBL: 100cc  UOP: 65cc via foley catheter  Complications: None   Brief history: The patient is a 68 year old male with a displaced left femoral neck fracture. A thorough discussion about treatment options including alternatives to surgery were discussed with the patient and family and the risks and benefits of cemented hemiarthroplasty as definitive surgical fixation were reviewed.  The patient was admitted to the hospital by the medical team and preoperative optimization including risk stratification and clearance for surgery was performed. All preoperative films were reviewed and an appropriate surgical plan was made prior to surgery.   Description of procedure: The patient was brought to the operating room where laterality was confirmed by all those present to be the left side.  The patient was moved to the table and administered spinal anesthesia. Patient was given an intravenous dose of antibiotics for surgical prophylaxis and TXA. The patient was positioned in lateral decubitus position with all bony prominences well-padded.  Surgical site was prepped with alcohol and chlorhexidine .  Surgical site over the hip was draped in typical sterile fashion with multiple layers of adhesive and nonadhesive drapes.  The incision site over the greater trochanter posteriorly was marked out with a sterile marker.    Surgical timeout was then called with participation of all staff in the room the patient was confirmed and laterality again confirmed.  An incision was made over the lateral aspect of the hip cheating posteriorly on the proximal aspect.  Careful soft tissue dissection and coagulation of all bleeders was carried out down to the level of the glut max fascia.  The fascia was carefully incised in line with the femur.  A Charnley retractor was placed deep to the fascia with care taken to ensure that there was no nerve entrapment in the retractor.  The bursa tissue was taken down over the posterior femur exposing the external rotators.  A dull Cobra retractor was placed under the abductor mechanism to protect the mechanism and fully expose the piriformis and short external rotators.  The external rotators were carefully detached from the femur with electrocautery and tagged with Ethibond sutures.  The capsule to the hip was incised and tagged with sutures.  Care was taken to ensure the labrum was not cut during the approach. The femoral neck fracture was encountered at this time and shown to be displaced.  The femur was rotated up and a retractor was placed behind it.  A template was used to mark out a femoral neck cleanup cut.  Cobra retractors were placed superiorly inferiorly along the remaining femoral neck to protect from saw excursion.  An oscillating saw was then used to perform a new femoral neck cut.   After the femoral and neck resection attention was turned back to the broken femoral head which remained in the acetabulum.  A corkscrew was used to carefully remove the remaining femoral head without penetrating into the acetabulum or damaging the cartilage.  The acetabulum was then carefully irrigated to remove  any bony fragments or remaining debris.  The femoral head was then measured and trial balls were used to assess the size of the acetabulum.  The acetabulum was found to accommodate a size 52mm head  with a good suction cup fit and no overstuffing.  Attention was turned back to the femur and a femoral neck retractor was placed.  The femur was opened with a box osteotome and canal finder.  The femur was then sequentially broached up to a size 5 broach which allowed for good fit and fill.  A calcar planer was then used to smooth out the calcar.  A trial neck and head were then attached and the hip was reduced.  The hip was found to be stable on reduction with full range of motion without subluxation or dislocation and leg lengths felt equal.  The hip was carefully dislocated the head and neck trial were removed.  The femur was then exposed again.  A lap was placed within the acetabulum to protect from any cement.  A canal restrictor was then placed distally within the femoral canal.  Anesthesia was alerted that we were going to begin femoral preparation and cementation.  The canal was irrigated with copious saline via long tip pulsatile lavage.  The canal was then scrubbed with a brush reirrigated and aspirated.  A suction drying sponge was then placed within the canal and hooked to suction while the cement was being prepared.  A cement gun was prepared and the canal was carefully filled with cement and then pressurized.  The implant was pressed into the cement mantle with care taken to maintain an appropriate version of approximately 15 degrees and tapped into place.  Excess cement was carefully removed and the implant was held in place until the cement fully cured.  After full curing of the cement the acetabulum was reirrigated and ensured to be clear of any debris.  A trial ball was placed on the femoral component reduced and taken through range of motion.  The hip was found to be stable and reduction with a full range of motion without subluxation or dislocation with equal leg lengths.  The hip was brought up into 90 degrees of flexion and internal rotation and was stable up to 85 degrees.  The hip was  fully stable in sleeper position and to external rotation and extension.  The hip was then carefully dislocated the trial ball was removed and a real bipolar component was impacted into place and checked for stability after cleaning the trunnion. The acetabulum was irrigated and the hip was reduced.  The hip showed good range of motion and stability on testing with both stability in flexion internal rotation and extension external rotation.  The hip was then irrigated with betadine based surgiphor solution and pulsatile lavage with saline.  A 2 mm drill bit was then used to make 2 holes in the greater trochanter the piriformis and capsular tissues were reapproximated and passed through the drill holes and tied over the greater trochanter.  The fascia was then approximated with #1 Vicryl and #2 barbed suture.  The subcutaneous tissues and skin were closed with 0 Vicryl 2-0 Vicryl and 4-0 stratafix suture and the skin closed with Dermabond.  A sterile dressing was then applied.  Lap, sharps, and sponge counts were correct at the end of the case.   The patient was then rolled supine and an x-ray was taken in the operating room. Leg lengths were clinically equal on examination with  a good distal pulse. Components appeared in good position with no fractures noted on x-ray.  Patient was then transferred to a hospital bed and transferred to the recovery room in stable condition.

## 2023-12-13 NOTE — ED Notes (Signed)
 Pt assigned to room but at XR.

## 2023-12-13 NOTE — ED Notes (Signed)
 Orthopedic Provider estimates surgery around 1600.

## 2023-12-13 NOTE — ED Triage Notes (Signed)
 First Nurse Note: Patient to ED via [facility] from home. PT reports fall on Friday. C/o left hip pain. Normally uses walker. PT also noted to wounds on bilateral forearms that he's had "for a while."  VS WNL

## 2023-12-13 NOTE — H&P (Signed)
 History and Physical    David [name] ZOX:[id number] DOB: [birth year] DOA: 12/13/2023  PCP: Raina Bunting, DO (Confirm with patient/family/NH records and if not entered, this has to be entered at [facility] point of entry) Patient coming from: Home  I have personally briefly reviewed patient's old medical records in [facility]  Chief Complaint: David [name] and broke my hip  HPI: David [name] is a 68 y.o. male with medical history significant of COPD, stroke with chronic ambulation impairment, squamous cell carcinoma back, lung cancer status post radiation and chemotherapy, gout, moderate protein calorie malnutrition, presented with left hip pain after fall.  At baseline, patient uses roller walker to ambulate since earlier this year for unsteady gait.  Last Friday, patient fell while walking down stairs and missed 2 steps.  Hit his left hip and started to have pain.  He went to his brother's house in the evening and fell again on left hip.  Over the weekend symptoms started get worse, unable to put weight on left hip this morning and decided to come to ED.  He denies any prodromes of lightheadedness palpitation shortness of breath before the event.  ED Course: Afebrile, no tachycardia blood pressure 120/60 O2 saturation 100% on room air.  X-ray showed nondisplaced left proximal femoral neck fracture  Review of Systems: As per HPI otherwise 14 point review of systems negative.    Past Medical History:  Diagnosis Date   Adjustment disorder    Anemia    Arthritis    Asthma    Centrilobular emphysema (HCC)    Chest pain    Chronic bursitis    of left shoulder   Chronic pain of both shoulders    Chronic respiratory failure with hypoxia (HCC)    COPD (chronic obstructive pulmonary disease) (HCC)    not on home o2   GERD (gastroesophageal reflux disease)    Gout    History of kidney stones    History of melanoma    Hypertension    Insomnia    Long term prescription opiate use     Multiple lung nodules    Oxygen deficiency    O2 1 LITER [facility]   Pain    chronic pain all over   Severe protein-calorie malnutrition (HCC)    Small cell lung cancer (HCC)    melanoma   Stroke (HCC) 2023   Tobacco abuse    Tobacco use     Past Surgical History:  Procedure Laterality Date   ELECTROMAGNETIC NAVIGATION BROCHOSCOPY N/A 07/22/2016   Procedure: ELECTROMAGNETIC NAVIGATION BRONCHOSCOPY;  Surgeon: Cleve Dale, MD;  Location: [facility] ORS;  Service: Cardiopulmonary;  Laterality: N/A;   MASS EXCISION N/A 02/21/2023   Procedure: EXCISION MASS;  Surgeon: Eldred Grego, MD;  Location: [facility] ORS;  Service: General;  Laterality: N/A;   PERIPHERAL VASCULAR CATHETERIZATION N/A 09/15/2015   Procedure: Melville Stade Cath Insertion;  Surgeon: Celso College, MD;  Location: [facility] INVASIVE CV LAB;  Service: Cardiovascular;  Laterality: N/A;   PORTA CATH REMOVAL N/A 05/05/2017   Procedure: Melville Stade Cath Removal;  Surgeon: Celso College, MD;  Location: [facility] INVASIVE CV LAB;  Service: Cardiovascular;  Laterality: N/A;   SKIN GRAFT     [facility]     reports that he has been smoking cigarettes. He started smoking about 29 years ago. He has a 51.8 pack-year smoking history. He has never used smokeless tobacco. He reports current drug use. Drug: "Crack" cocaine. He reports that he does not drink  alcohol.  Allergies  Allergen Reactions   Tylenol  [Acetaminophen ] Other (See Comments)    Unable to take due to chemotherapy regimen   Vicodin [Hydrocodone -Acetaminophen ] Nausea And Vomiting and Rash    Family History  Problem Relation Age of Onset   Lung cancer Mother    Heart attack Mother 85   Cirrhosis Father 77     Prior to Admission medications   Medication Sig Start Date End Date Taking? Authorizing Provider  albuterol  (VENTOLIN  HFA) 108 (90 Base) MCG/ACT inhaler Inhale 2 puffs into the lungs every 4 (four) hours as needed for wheezing or shortness of breath. 06/20/23   Karamalegos, Kayleen Party, DO   Budeson-Glycopyrrol-Formoterol  (BREZTRI  AEROSPHERE) 160-9-4.8 MCG/ACT AERO Inhale 2 puffs into the lungs 2 (two) times daily. Patient not taking: Reported on 11/04/2023 11/12/21   Parrett, Macdonald Savoy, NP  metoprolol  tartrate (LOPRESSOR ) 25 MG tablet TAKE 1 TABLET BY MOUTH TWICE A DAY Patient not taking: Reported on 11/04/2023 08/16/23   Raina Bunting, DO  mirtazapine  (REMERON ) 15 MG tablet Take 1 tablet (15 mg total) by mouth at bedtime. 08/22/20   Karamalegos, Kayleen Party, DO  mupirocin  ointment (BACTROBAN ) 2 % Apply 1 Application topically 2 (two) times daily. For 1-2 weeks until affected area healed 11/04/23   Raina Bunting, DO  naproxen  (NAPROSYN ) 500 MG tablet Take 1 tablet (500 mg total) by mouth 2 (two) times daily with a meal. 10/05/21   Ostin Carbine, MD  pantoprazole  (PROTONIX ) 40 MG tablet TAKE 1 TABLET BY MOUTH EVERY DAY BEFORE BREAKFAST 08/16/23   Raina Bunting, DO    Physical Exam: Vitals:   12/13/23 0939 12/13/23 0940 12/13/23 1038 12/13/23 1200  BP:  103/71  124/67  Pulse:  95  69  Resp: 18   20  Temp: 97.9 F (36.6 C)     TempSrc: Oral     SpO2:  100% 93% 100%  Weight:  48.1 kg    Height:  5\' 9"  (1.753 m)      Constitutional: NAD, calm, comfortable Vitals:   12/13/23 0939 12/13/23 0940 12/13/23 1038 12/13/23 1200  BP:  103/71  124/67  Pulse:  95  69  Resp: 18   20  Temp: 97.9 F (36.6 C)     TempSrc: Oral     SpO2:  100% 93% 100%  Weight:  48.1 kg    Height:  5\' 9"  (1.753 m)     Eyes: PERRL, lids and conjunctivae normal ENMT: Mucous membranes are moist. Posterior pharynx clear of any exudate or lesions.Normal dentition.  Neck: normal, supple, no masses, no thyromegaly Respiratory: clear to auscultation bilaterally, no wheezing, no crackles. Normal respiratory effort. No accessory muscle use.  Cardiovascular: Regular rate and rhythm, no murmurs / rubs / gallops. No extremity edema. 2+ pedal pulses. No carotid bruits.  Abdomen: no  tenderness, no masses palpated. No hepatosplenomegaly. Bowel sounds positive.  Musculoskeletal: no clubbing / cyanosis. No joint deformity upper and lower extremities. Good ROM, no contractures. Normal muscle tone. Left hip tenderness Skin: no rashes, lesions, ulcers. No induration Neurologic: CN 2-12 grossly intact. Sensation intact, DTR normal. Strength 5/5 in all 4.  Psychiatric: Normal judgment and insight. Alert and oriented x 3. Normal mood.     Labs on Admission: I have personally reviewed following labs and imaging studies  CBC: Recent Labs  Lab 12/13/23 1130  WBC 5.8  NEUTROABS 3.7  HGB 11.3*  HCT 34.6*  MCV 89.6  PLT 233   Basic Metabolic Panel:  Recent Labs  Lab 12/13/23 1130  NA 138  K 3.7  CL 103  CO2 26  GLUCOSE 73  BUN 10  CREATININE 0.78  CALCIUM 8.7*   GFR: Estimated Creatinine Clearance: 61.8 mL/min (by C-G formula based on SCr of 0.78 mg/dL). Liver Function Tests: No results for input(s): "AST", "ALT", "ALKPHOS", "BILITOT", "PROT", "ALBUMIN" in the last 168 hours. No results for input(s): "LIPASE", "AMYLASE" in the last 168 hours. No results for input(s): "AMMONIA" in the last 168 hours. Coagulation Profile: Recent Labs  Lab 12/13/23 1130  INR 1.0   Cardiac Enzymes: No results for input(s): "CKTOTAL", "CKMB", "CKMBINDEX", "TROPONINI" in the last 168 hours. BNP (last 3 results) No results for input(s): "PROBNP" in the last 8760 hours. HbA1C: No results for input(s): "HGBA1C" in the last 72 hours. CBG: No results for input(s): "GLUCAP" in the last 168 hours. Lipid Profile: No results for input(s): "CHOL", "HDL", "LDLCALC", "TRIG", "CHOLHDL", "LDLDIRECT" in the last 72 hours. Thyroid Function Tests: No results for input(s): "TSH", "T4TOTAL", "FREET4", "T3FREE", "THYROIDAB" in the last 72 hours. Anemia Panel: No results for input(s): "VITAMINB12", "FOLATE", "FERRITIN", "TIBC", "IRON", "RETICCTPCT" in the last 72 hours. Urine analysis:     Component Value Date/Time   COLORURINE YELLOW (A) 12/31/2015 1356   APPEARANCEUR CLOUDY (A) 12/31/2015 1356   APPEARANCEUR Clear 05/23/2014 1037   LABSPEC 1.025 12/31/2015 1356   LABSPEC 1.026 05/23/2014 1037   PHURINE 5.0 12/31/2015 1356   GLUCOSEU 50 (A) 12/31/2015 1356   GLUCOSEU Negative 05/23/2014 1037   HGBUR 2+ (A) 12/31/2015 1356   BILIRUBINUR NEGATIVE 12/31/2015 1356   BILIRUBINUR Negative 05/23/2014 1037   KETONESUR NEGATIVE 12/31/2015 1356   PROTEINUR NEGATIVE 12/31/2015 1356   NITRITE NEGATIVE 12/31/2015 1356   LEUKOCYTESUR NEGATIVE 12/31/2015 1356   LEUKOCYTESUR Negative 05/23/2014 1037    Radiological Exams on Admission: CT Hip Left Wo Contrast Result Date: 12/13/2023 CLINICAL DATA:  Fall 4 days ago.  Left hip fracture. EXAM: CT OF THE LEFT HIP WITHOUT CONTRAST TECHNIQUE: Multidetector CT imaging of the left hip was performed according to the standard protocol. Multiplanar CT image reconstructions were also generated. RADIATION DOSE REDUCTION: This exam was performed according to the departmental dose-optimization program which includes automated exposure control, adjustment of the mA and/or kV according to patient size and/or use of iterative reconstruction technique. COMPARISON:  Radiographs 12/13/2023 and 10/05/2021. FINDINGS: Bones/Joint/Cartilage The bones appear adequately mineralized. Subcapital fracture of the left femoral neck demonstrates mild impaction, mild apex anterior angulation and mild anterior displacement of (up to 1.1 cm). There is no involvement of the femoral head articular surface. There is no intertrochanteric extension of the fracture. The femoral head remains located. No evidence of fracture of the left hemipelvis. Minimal underlying left hip degenerative changes. There is advanced disc space narrowing with endplate osteophytes and vacuum phenomenon at L5-S1. Ligaments Suboptimally assessed by CT. Muscles and Tendons The visualized left hip and pelvic  muscles appear unremarkable. No focal muscular atrophy or significant hematoma. Soft tissues No evidence of periarticular hematoma, foreign body or soft tissue emphysema. No significant hip joint effusion. Aortic, iliac and femoral atherosclerosis noted. There are diverticular changes within the distal colon. IMPRESSION: 1. Mildly impacted, angulated and displaced subcapital fracture of the left femoral neck as described. No involvement of the femoral head articular surface or intertrochanteric extension. 2. No evidence of periarticular hematoma or soft tissue emphysema. 3. Advanced degenerative disc disease at L5-S1. 4.  Aortic Atherosclerosis (ICD10-I70.0). Electronically Signed   By: Sammie Crigler  Redge Cancel M.D.   On: 12/13/2023 11:50   DG Chest Portable 1 View Result Date: 12/13/2023 CLINICAL DATA:  Preop hip fracture, history of COPD EXAM: PORTABLE CHEST 1 VIEW COMPARISON:  None Available. FINDINGS: Lungs are hyperexpanded with interstitial prominence. The previously observed right central mid lung zone density is different in appearance and slightly more confluent. No significant pleural effusion and no evidence of pneumothorax. IMPRESSION: 1. Imaging features of COPD and chronic lung disease. 2. More confluent appearance of right mid lung zone lesion. This was previously characterized by CT on June 22, 2021. Consideration should be given toward follow-up evaluation by chest CT which can be performed as an outpatient to more accurately characterize change and malignant potential. 3. No pleural effusion or pneumothorax. Electronically Signed   By: Reagan Camera M.D.   On: 12/13/2023 11:26   DG Hip Unilat W or Wo Pelvis 2-3 Views Left Result Date: 12/13/2023 CLINICAL DATA:  Left hip pain after fall several days ago. EXAM: DG HIP (WITH OR WITHOUT PELVIS) 2-3V LEFT COMPARISON:  October 05, 2021. FINDINGS: Mildly displaced proximal left femoral neck fracture is noted. IMPRESSION: Mildly displaced proximal left  femoral neck fracture. Electronically Signed   By: Rosalene Colon M.D.   On: 12/13/2023 10:48   CT Head Wo Contrast Result Date: 12/13/2023 CLINICAL DATA:  Fall EXAM: CT HEAD WITHOUT CONTRAST CT CERVICAL SPINE WITHOUT CONTRAST TECHNIQUE: Multidetector CT imaging of the head and cervical spine was performed following the standard protocol without intravenous contrast. Multiplanar CT image reconstructions of the cervical spine were also generated. RADIATION DOSE REDUCTION: This exam was performed according to the departmental dose-optimization program which includes automated exposure control, adjustment of the mA and/or kV according to patient size and/or use of iterative reconstruction technique. COMPARISON:  04/20/2022 FINDINGS: CT HEAD FINDINGS Brain: No evidence of acute infarction, hemorrhage, hydrocephalus, extra-axial collection or mass lesion/mass effect. Periventricular white matter hypodensity. Vascular: No hyperdense vessel or unexpected calcification. Skull: Normal. Negative for fracture or focal lesion. Sinuses/Orbits: No acute finding. Other: None. CT CERVICAL SPINE FINDINGS Alignment: Degenerative straightening of the normal cervical lordosis. Skull base and vertebrae: No acute fracture. No primary bone lesion or focal pathologic process. Soft tissues and spinal canal: No prevertebral fluid or swelling. No visible canal hematoma. Disc levels: Focally moderate disc degenerative disease and osteophytosis of C6-C7 with otherwise minimal disc degenerative change. Upper chest: Negative. Other: None. IMPRESSION: 1. No acute intracranial pathology. Small-vessel white matter disease. 2. No fracture or static subluxation of the cervical spine. 3. Focally moderate disc degenerative disease and osteophytosis of C6-C7 with otherwise minimal disc degenerative change. Electronically Signed   By: Fredricka Jenny M.D.   On: 12/13/2023 10:18   CT Cervical Spine Wo Contrast Result Date: 12/13/2023 CLINICAL DATA:   Fall EXAM: CT HEAD WITHOUT CONTRAST CT CERVICAL SPINE WITHOUT CONTRAST TECHNIQUE: Multidetector CT imaging of the head and cervical spine was performed following the standard protocol without intravenous contrast. Multiplanar CT image reconstructions of the cervical spine were also generated. RADIATION DOSE REDUCTION: This exam was performed according to the departmental dose-optimization program which includes automated exposure control, adjustment of the mA and/or kV according to patient size and/or use of iterative reconstruction technique. COMPARISON:  04/20/2022 FINDINGS: CT HEAD FINDINGS Brain: No evidence of acute infarction, hemorrhage, hydrocephalus, extra-axial collection or mass lesion/mass effect. Periventricular white matter hypodensity. Vascular: No hyperdense vessel or unexpected calcification. Skull: Normal. Negative for fracture or focal lesion. Sinuses/Orbits: No acute finding. Other: None. CT CERVICAL  SPINE FINDINGS Alignment: Degenerative straightening of the normal cervical lordosis. Skull base and vertebrae: No acute fracture. No primary bone lesion or focal pathologic process. Soft tissues and spinal canal: No prevertebral fluid or swelling. No visible canal hematoma. Disc levels: Focally moderate disc degenerative disease and osteophytosis of C6-C7 with otherwise minimal disc degenerative change. Upper chest: Negative. Other: None. IMPRESSION: 1. No acute intracranial pathology. Small-vessel white matter disease. 2. No fracture or static subluxation of the cervical spine. 3. Focally moderate disc degenerative disease and osteophytosis of C6-C7 with otherwise minimal disc degenerative change. Electronically Signed   By: Fredricka Jenny M.D.   On: 12/13/2023 10:18    EKG: Independently reviewed.  Sinus rhythm, no acute ST changes.  Assessment/Plan Principal Problem:   Hip fracture (HCC) Active Problems:   COPD (chronic obstructive pulmonary disease) (HCC)   Severe protein-calorie  malnutrition (HCC)   Displaced fracture of left femoral neck (HCC)  (please populate well all problems here in Problem List. (For example, if patient is on BP meds at home and you resume or decide to hold them, it is a problem that needs to be her. Same for CAD, COPD, HLD and so on)  Left femoral neck fracture - Secondary to mechanical fall - ORIF this afternoon - Start aspirin  and low-dose of metoprolol   COPD - Unstaged, appears to be advanced stage.  As patient does have a chronic cough with intermittent sputum production.  Today's x-ray showed no acute infiltrates. - Continue LABA and ICS - DuoNebs every 6 hours - As needed albuterol  - Incentive spirometry - Patient also requested home oxygen evaluation, consider ambulatory pulse ox after ORIF.  History of stroke - No focal deficit - Aspirin   Severe protein calorie malnutrition - Ensure - Consult dietitian  Left arm skin lesion - Suspect squamous cell carcinoma, outpatient dermatology follow-up for biopsy  DVT prophylaxis: Lovenox  Code Status: Full code Family Communication: None at bedside Disposition Plan: Patient sick with left hip fracture requiring ORIF, expect more than 2 midnight hospital stay Consults called: Orthopedic surgery Admission status: MedSurg admission   [place] MD [facility] Pager [phone]  12/13/2023, 1:13 PM

## 2023-12-13 NOTE — Progress Notes (Signed)
 Patient awake/alert x4.  Abduction pillow in place. Pulses intact bilateral lower ext, warm to touch. Able to wiggle toes. Tolerated coffee and graham crackers without event. Denies discomfort at this time.

## 2023-12-13 NOTE — Anesthesia Preprocedure Evaluation (Addendum)
 Anesthesia Evaluation  Patient identified by MRN, date of birth, ID band Patient awake    Reviewed: Allergy & Precautions, H&P , NPO status , Patient's Chart, lab work & pertinent test results, Unable to perform ROS - Chart review only  History of Anesthesia Complications Negative for: history of anesthetic complications  Airway Mallampati: I   Neck ROM: Full    Dental  (+) Edentulous Upper, Edentulous Lower   Pulmonary asthma , COPD (should be on home O2 at night, but does not use), Current Smoker (1/3 ppd) and Patient abstained from smoking. SCLC s/p rad and chemo   Pulmonary exam normal breath sounds clear to auscultation       Cardiovascular Exercise Tolerance: Poor hypertension, Normal cardiovascular exam Rhythm:Regular Rate:Normal  ECG 12/13/23:  Sinus rhythm with Fusion complexes Right atrial enlargement Right superior axis deviation Inferior infarct , age undetermined Anterior infarct (cited on or before 19-Oct-2017)  Echo 01/05/22:    1. Technically very difficult study.    2. The left ventricle is normal in size with normal wall thickness.    3. The left ventricular systolic function is normal, LVEF is visually estimated at > 55%.    4. The right ventricle is not well visualized but probably normal in size and systolic function.    Neuro/Psych  PSYCHIATRIC DISORDERS Anxiety     Chronic pain; cocaine use disorder, last use 2 weeks ago; quit alcohol use in 1994 CVA (2023; uses walker), No Residual Symptoms    GI/Hepatic Neg liver ROS,GERD  Medicated,,  Endo/Other  negative endocrine ROS    Renal/GU Renal disease (nephrolithiasis)     Musculoskeletal  (+) Arthritis ,  Gout; melanoma   Abdominal   Peds  Hematology  (+) Blood dyscrasia, anemia   Anesthesia Other Findings   Reproductive/Obstetrics                             Anesthesia Physical Anesthesia Plan  ASA: 3 and  emergent  Anesthesia Plan: Spinal   Post-op Pain Management:    Induction: Intravenous  PONV Risk Score and Plan: 1 and Propofol  infusion, TIVA, Treatment may vary due to age or medical condition and Ondansetron   Airway Management Planned: Natural Airway and Nasal Cannula  Additional Equipment:   Intra-op Plan:   Post-operative Plan:   Informed Consent: I have reviewed the patients History and Physical, chart, labs and discussed the procedure including the risks, benefits and alternatives for the proposed anesthesia with the patient or authorized representative who has indicated his/her understanding and acceptance.       Plan Discussed with: CRNA  Anesthesia Plan Comments: (Plan for spinal and GA with natural airway, LMA/GETA backup.  Patient consented for risks of anesthesia including but not limited to:  - adverse reactions to medications - damage to eyes, teeth, lips or other oral mucosa - nerve damage due to positioning  - sore throat or hoarseness - headache, bleeding, infection, nerve damage 2/2 spinal - damage to heart, brain, nerves, lungs, other parts of body or loss of life  Informed patient about role of CRNA in peri- and intra-operative care.  Patient voiced understanding.)       Anesthesia Quick Evaluation

## 2023-12-13 NOTE — ED Notes (Signed)
 Pt prepped for OR, dressed out in gown and fall socks

## 2023-12-13 NOTE — Anesthesia Postprocedure Evaluation (Signed)
 Anesthesia Post Note  Patient: Stella E Mcauliff  Procedure(s) Performed: HEMIARTHROPLASTY (BIPOLAR) HIP, POSTERIOR APPROACH FOR FRACTURE (Left: Hip)  Patient location during evaluation: PACU Anesthesia Type: Spinal Level of consciousness: oriented and awake and alert Pain management: pain level controlled Vital Signs Assessment: post-procedure vital signs reviewed and stable Respiratory status: spontaneous breathing, respiratory function stable and patient connected to nasal cannula oxygen Cardiovascular status: blood pressure returned to baseline and stable Postop Assessment: no headache, no backache and no apparent nausea or vomiting Anesthetic complications: no   No notable events documented.   Last Vitals:  Vitals:   12/13/23 1316 12/13/23 1533  BP: 137/80 122/72  Pulse: 66 69  Resp: 16 16  Temp: 36.7 C (!) 36.4 C  SpO2: 100% 97%    Last Pain:  Vitals:   12/13/23 1533  TempSrc: Temporal  PainSc: 0-No pain                 Baylea Milburn

## 2023-12-13 NOTE — Anesthesia Procedure Notes (Signed)
 Spinal  Patient location during procedure: OR Start time: 12/13/2023 4:36 PM End time: 12/13/2023 4:45 PM Reason for block: surgical anesthesia Staffing Performed: anesthesiologist and resident/CRNA  Anesthesiologist: Dorothey Gate, MD Resident/CRNA: Philippe Brazen, CRNA Performed by: Dorothey Gate, MD Authorized by: Dorothey Gate, MD   Preanesthetic Checklist Completed: patient identified, IV checked, site marked, risks and benefits discussed, surgical consent, monitors and equipment checked, pre-op evaluation and timeout performed Spinal Block Patient position: right lateral decubitus Prep: ChloraPrep Patient monitoring: heart rate, cardiac monitor, continuous pulse ox and blood pressure Approach: left paramedian Location: L4-5 Injection technique: single-shot Needle Needle type: Whitacre  Needle gauge: 22 G Needle length: 9 cm Assessment Sensory level: T4 Events: CSF return and second provider Additional Notes Initial attempts midline and paramedian at L4-5 by Sabra Cramp with os encountered.  Attempt by Dakia Schifano midline with os, then left paramedian attempt successful.

## 2023-12-13 NOTE — ED Notes (Signed)
 Fall precautions in place for Pt. This RN placed fall band, fall grip socks, bed alarm and fall sign.

## 2023-12-13 NOTE — Transfer of Care (Signed)
 Immediate Anesthesia Transfer of Care Note  Patient: David [name]  Procedure(s) Performed: HEMIARTHROPLASTY (BIPOLAR) HIP, POSTERIOR APPROACH FOR FRACTURE (Left: Hip)  Patient Location: PACU  Anesthesia Type:General  Level of Consciousness: awake, alert , oriented, and patient cooperative  Airway & Oxygen Therapy: Patient Spontanous Breathing  Post-op Assessment: Report given to RN and Post -op Vital signs reviewed and stable  Post vital signs: Reviewed and stable  Last Vitals:  Vitals Value Taken Time  BP 103/65 12/13/23 1845  Temp 97.6   Pulse 70 12/13/23 1847  Resp 15 12/13/23 1847  SpO2 100 % 12/13/23 1847  Vitals shown include unfiled device data.  Last Pain:  Vitals:   12/13/23 1533  TempSrc: Temporal  PainSc: 0-No pain      Patients Stated Pain Goal: 0 (12/13/23 1533)  Complications: No notable events documented.

## 2023-12-13 NOTE — ED Triage Notes (Addendum)
 Pt here after a fall and left hip pain. Pt fell last Friday after tripping over his walker and has been having trouble walking since. Pt is still able to walk on his left leg but not put much pressure on it. Pt also states he hit his head when he fell but denies LOC. Pt denies CP but endorses SOB.   120/60 100 97% RA

## 2023-12-13 NOTE — Consult Note (Signed)
 ORTHOPAEDIC CONSULTATION  REQUESTING PHYSICIAN: Frank Island, MD  Chief Complaint:   Left hip fracture  History of Present Illness: David [name] is a 68 y.o. male with history of chronic pain, gout, COPD with severe protein calorie malnutrition who presented to the emergency room after a fall last Friday.  Patient reports he was getting out of a car his balance and fell landing directly onto his left side of his face and his left hip.  He reports he had no pain in hip prior to the fall.  He has been trying to use a rolling walker to get around since Friday and has been unable to put weight on the left leg he came in for evaluation.  Patient reports that he was told the past he should likely be using a walker or a cane but does not use it for ambulation.  Patient reports he smokes 7 cigarettes a day.  He denies any shortness of breath or chest pain at this time.  Past Medical History:  Diagnosis Date   Adjustment disorder    Anemia    Arthritis    Asthma    Centrilobular emphysema (HCC)    Chest pain    Chronic bursitis    of left shoulder   Chronic pain of both shoulders    Chronic respiratory failure with hypoxia (HCC)    COPD (chronic obstructive pulmonary disease) (HCC)    not on home o2   GERD (gastroesophageal reflux disease)    Gout    History of kidney stones    History of melanoma    Hypertension    Insomnia    Long term prescription opiate use    Multiple lung nodules    Oxygen deficiency    O2 1 LITER [facility]   Pain    chronic pain all over   Severe protein-calorie malnutrition (HCC)    Small cell lung cancer (HCC)    melanoma   Stroke (HCC) 2023   Tobacco abuse    Tobacco use    Past Surgical History:  Procedure Laterality Date   ELECTROMAGNETIC NAVIGATION BROCHOSCOPY N/A 07/22/2016   Procedure: ELECTROMAGNETIC NAVIGATION BRONCHOSCOPY;  Surgeon: Cleve Dale, MD;  Location: [facility] OR[facility];  Service:  Cardiopulmonary;  Laterality: N/A;   MASS EXCISION N/A 02/21/2023   Procedure: EXCISION MASS;  Surgeon: Eldred Grego, MD;  Location: [facility];  Service: General;  Laterality: N/A;   PERIPHERAL VASCULAR CATHETERIZATION N/A 09/15/2015   Procedure: [place]ville Stade Cath Insertion;  Surgeon: Celso College, MD;  Location: [facility]AS[facility];  Service: Cardiovascular;  Laterality: N/A;   PORTA CATH REMOVAL N/A 05/05/2017   Procedure: Mel[place] Stade Cath Removal;  Surgeon: Celso College, MD;  Location: [facility];  Service: Cardiovascular;  Laterality: N/A;   SKIN GRAFT     [facility]   Social History   Socioeconomic History   Marital status: Widowed    Spouse name: Not on file   Number of children: Not on file   Years of education: 8th grade   Highest education level: Not on file  Occupational History   Occupation: Retired Designer, fashion/clothing  Tobacco Use   Smoking status: Every Day    Current packs/day: 1.00    Average packs/day: 1 pack/day for 51.8 years (51.8 ttl pk-yrs)    Types: Cigarettes    Start date: 08/12/1994   Smokeless tobacco: Never   Tobacco comments:    Smoking 1 ppd s of 11/12/2021 hfb   Vaping Use  Vaping status: Never Used  Substance and Sexual Activity   Alcohol use: No    Alcohol/week: 0.0 standard drinks of alcohol   Drug use: Yes    Types: "Crack" cocaine    Comment: 02/14/23   Sexual activity: Never  Other Topics Concern   Not on file  Social History Narrative   Lives at home with wife   Social Drivers of Corporate investment banker Strain: Low Risk  (06/10/2023)   Overall Financial Resource Strain (CARDIA)    Difficulty of Paying Living Expenses: Not very hard  Recent Concern: Financial Resource Strain - Medium Risk (04/20/2023)   Overall Financial Resource Strain (CARDIA)    Difficulty of Paying Living Expenses: Somewhat hard  Food Insecurity: Food Insecurity Present (07/27/2023)   Hunger Vital Sign    Worried About Running Out of Food in the Last Year: Sometimes true    Ran  Out of Food in the Last Year: Sometimes true  Transportation Needs: Unmet Transportation Needs (07/27/2023)   PRAPARE - Administrator, Civil Service (Medical): Yes    Lack of Transportation (Non-Medical): Yes  Physical Activity: Insufficiently Active (06/10/2023)   Exercise Vital Sign    Days of Exercise per Week: 5 days    Minutes of Exercise per Session: 10 min  Stress: No Stress Concern Present (06/10/2023)   [employer] of Occupational Health - Occupational Stress Questionnaire    Feeling of Stress : Not at all  Social Connections: Socially Isolated (06/10/2023)   Social Connection and Isolation Panel [NHANES]    Frequency of Communication with Friends and Family: More than three times a week    Frequency of Social Gatherings with Friends and Family: More than three times a week    Attends Religious Services: Never    Database administrator or Organizations: No    Attends Banker Meetings: Never    Marital Status: Widowed   Family History  Problem Relation Age of Onset   Lung cancer Mother    Heart attack Mother 3   Cirrhosis Father 92   Allergies  Allergen Reactions   Tylenol  [Acetaminophen ] Other (See Comments)    Unable to take due to chemotherapy regimen   Vicodin [Hydrocodone -Acetaminophen ] Nausea And Vomiting and Rash   Prior to Admission medications   Medication Sig Start Date End Date Taking? Authorizing Provider  albuterol  (VENTOLIN  HFA) 108 (90 Base) MCG/ACT inhaler Inhale 2 puffs into the lungs every 4 (four) hours as needed for wheezing or shortness of breath. 06/20/23   Karamalegos, Kayleen Party, DO  Budeson-Glycopyrrol-Formoterol  (BREZTRI  AEROSPHERE) 160-9-4.8 MCG/ACT AERO Inhale 2 puffs into the lungs 2 (two) times daily. Patient not taking: Reported on 11/04/2023 11/12/21   Parrett, Macdonald Savoy, NP  metoprolol  tartrate (LOPRESSOR ) 25 MG tablet TAKE 1 TABLET BY MOUTH TWICE A DAY Patient not taking: Reported on 11/04/2023 08/16/23    Raina Bunting, DO  mirtazapine  (REMERON ) 15 MG tablet Take 1 tablet (15 mg total) by mouth at bedtime. 08/22/20   Karamalegos, Alexander J, DO  mupirocin  ointment (BACTROBAN ) 2 % Apply 1 Application topically 2 (two) times daily. For 1-2 weeks until affected area healed 11/04/23   Raina Bunting, DO  naproxen  (NAPROSYN ) 500 MG tablet Take 1 tablet (500 mg total) by mouth 2 (two) times daily with a meal. 10/05/21   Aaronjames Carbine, MD  pantoprazole  (PROTONIX ) 40 MG tablet TAKE 1 TABLET BY MOUTH EVERY DAY BEFORE BREAKFAST 08/16/23   [place] Kayleen Party, DO  CT Hip Left Wo Contrast Result Date: 12/13/2023 CLINICAL DATA:  Fall 4 days ago.  Left hip fracture. EXAM: CT OF THE LEFT HIP WITHOUT CONTRAST TECHNIQUE: Multidetector CT imaging of the left hip was performed according to the standard protocol. Multiplanar CT image reconstructions were also generated. RADIATION DOSE REDUCTION: This exam was performed according to the departmental dose-optimization program which includes automated exposure control, adjustment of the mA and/or kV according to patient size and/or use of iterative reconstruction technique. COMPARISON:  Radiographs 12/13/2023 and 10/05/2021. FINDINGS: Bones/Joint/Cartilage The bones appear adequately mineralized. Subcapital fracture of the left femoral neck demonstrates mild impaction, mild apex anterior angulation and mild anterior displacement of (up to 1.1 cm). There is no involvement of the femoral head articular surface. There is no intertrochanteric extension of the fracture. The femoral head remains located. No evidence of fracture of the left hemipelvis. Minimal underlying left hip degenerative changes. There is advanced disc space narrowing with endplate osteophytes and vacuum phenomenon at L5-S1. Ligaments Suboptimally assessed by CT. Muscles and Tendons The visualized left hip and pelvic muscles appear unremarkable. No focal muscular atrophy or significant hematoma.  Soft tissues No evidence of periarticular hematoma, foreign body or soft tissue emphysema. No significant hip joint effusion. Aortic, iliac and femoral atherosclerosis noted. There are diverticular changes within the distal colon. IMPRESSION: 1. Mildly impacted, angulated and displaced subcapital fracture of the left femoral neck as described. No involvement of the femoral head articular surface or intertrochanteric extension. 2. No evidence of periarticular hematoma or soft tissue emphysema. 3. Advanced degenerative disc disease at L5-S1. 4.  Aortic Atherosclerosis (ICD10-I70.0). Electronically Signed   By: Elmon Hagedorn M.D.   On: 12/13/2023 11:50   DG Chest Portable 1 View Result Date: 12/13/2023 CLINICAL DATA:  Preop hip fracture, history of COPD EXAM: PORTABLE CHEST 1 VIEW COMPARISON:  None Available. FINDINGS: Lungs are hyperexpanded with interstitial prominence. The previously observed right central mid lung zone density is different in appearance and slightly more confluent. No significant pleural effusion and no evidence of pneumothorax. IMPRESSION: 1. Imaging features of COPD and chronic lung disease. 2. More confluent appearance of right mid lung zone lesion. This was previously characterized by CT on June 22, 2021. Consideration should be given toward follow-up evaluation by chest CT which can be performed as an outpatient to more accurately characterize change and malignant potential. 3. No pleural effusion or pneumothorax. Electronically Signed   By: Reagan Camera M.D.   On: 12/13/2023 11:26   DG Hip Unilat W or Wo Pelvis 2-3 Views Left Result Date: 12/13/2023 CLINICAL DATA:  Left hip pain after fall several days ago. EXAM: DG HIP (WITH OR WITHOUT PELVIS) 2-3V LEFT COMPARISON:  October 05, 2021. FINDINGS: Mildly displaced proximal left femoral neck fracture is noted. IMPRESSION: Mildly displaced proximal left femoral neck fracture. Electronically Signed   By: Rosalene Colon M.D.   On:  12/13/2023 10:48   CT Head Wo Contrast Result Date: 12/13/2023 CLINICAL DATA:  Fall EXAM: CT HEAD WITHOUT CONTRAST CT CERVICAL SPINE WITHOUT CONTRAST TECHNIQUE: Multidetector CT imaging of the head and cervical spine was performed following the standard protocol without intravenous contrast. Multiplanar CT image reconstructions of the cervical spine were also generated. RADIATION DOSE REDUCTION: This exam was performed according to the departmental dose-optimization program which includes automated exposure control, adjustment of the mA and/or kV according to patient size and/or use of iterative reconstruction technique. COMPARISON:  04/20/2022 FINDINGS: CT HEAD FINDINGS Brain: No evidence of acute infarction,  hemorrhage, hydrocephalus, extra-axial collection or mass lesion/mass effect. Periventricular white matter hypodensity. Vascular: No hyperdense vessel or unexpected calcification. Skull: Normal. Negative for fracture or focal lesion. Sinuses/Orbits: No acute finding. Other: None. CT CERVICAL SPINE FINDINGS Alignment: Degenerative straightening of the normal cervical lordosis. Skull base and vertebrae: No acute fracture. No primary bone lesion or focal pathologic process. Soft tissues and spinal canal: No prevertebral fluid or swelling. No visible canal hematoma. Disc levels: Focally moderate disc degenerative disease and osteophytosis of C6-C7 with otherwise minimal disc degenerative change. Upper chest: Negative. Other: None. IMPRESSION: 1. No acute intracranial pathology. Small-vessel white matter disease. 2. No fracture or static subluxation of the cervical spine. 3. Focally moderate disc degenerative disease and osteophytosis of C6-C7 with otherwise minimal disc degenerative change. Electronically Signed   By: Fredricka Jenny M.D.   On: 12/13/2023 10:18   CT Cervical Spine Wo Contrast Result Date: 12/13/2023 CLINICAL DATA:  Fall EXAM: CT HEAD WITHOUT CONTRAST CT CERVICAL SPINE WITHOUT CONTRAST TECHNIQUE:  Multidetector CT imaging of the head and cervical spine was performed following the standard protocol without intravenous contrast. Multiplanar CT image reconstructions of the cervical spine were also generated. RADIATION DOSE REDUCTION: This exam was performed according to the departmental dose-optimization program which includes automated exposure control, adjustment of the mA and/or kV according to patient size and/or use of iterative reconstruction technique. COMPARISON:  04/20/2022 FINDINGS: CT HEAD FINDINGS Brain: No evidence of acute infarction, hemorrhage, hydrocephalus, extra-axial collection or mass lesion/mass effect. Periventricular white matter hypodensity. Vascular: No hyperdense vessel or unexpected calcification. Skull: Normal. Negative for fracture or focal lesion. Sinuses/Orbits: No acute finding. Other: None. CT CERVICAL SPINE FINDINGS Alignment: Degenerative straightening of the normal cervical lordosis. Skull base and vertebrae: No acute fracture. No primary bone lesion or focal pathologic process. Soft tissues and spinal canal: No prevertebral fluid or swelling. No visible canal hematoma. Disc levels: Focally moderate disc degenerative disease and osteophytosis of C6-C7 with otherwise minimal disc degenerative change. Upper chest: Negative. Other: None. IMPRESSION: 1. No acute intracranial pathology. Small-vessel white matter disease. 2. No fracture or static subluxation of the cervical spine. 3. Focally moderate disc degenerative disease and osteophytosis of C6-C7 with otherwise minimal disc degenerative change. Electronically Signed   By: Fredricka Jenny M.D.   On: 12/13/2023 10:18    Positive ROS: All other systems have been reviewed and were otherwise negative with the exception of those mentioned in the HPI and as above.  Physical Exam: General:  Alert, no acute distress Psychiatric:  Patient is competent for consent with normal mood and affect   Cardiovascular:  No pedal  edema Respiratory:  No wheezing, non-labored breathing GI:  Abdomen is soft and non-tender Skin:  No lesions in the area of chief complaint Neurologic:  Sensation intact distally Lymphatic:  No axillary or cervical lymphadenopathy  Orthopedic Exam:  Left lower extremity Tender to palpation over the lateral hip mild aging ecchymosis skin intact Tender to pain with motion of the left leg positive logroll No tenderness palpation over the distal femur, knee, tibia ankle or foot Compartments all soft Neurovascular intact able dorsiflex and plantarflex the foot and toes intact dorsalis pedis pulse  Secondary survey No tenderness to palpation over other bony prominences in the lower extremities or bilateral upper extremities No pain with logroll or simulated axial loading of the right lower extremity All compartments soft No tenderness to palpation over the cervical or thoracic spine, no bony step-off Motor grossly intact throughout, no focal deficits Sensation  grossly intact throughout, no focal deficits Good distal pulses and capillary refill on all extremities   X-rays:  X-rays and CT scan images and report reviewed by myself.  There is a displaced left femoral neck fracture with up to a centimeter of anterior displacement.  Agree with radiologist interpretation full reads above.  Assessment: Displaced left femoral neck fracture  Plan: I reviewed the clinical and radiographic findings with the patient.  The patient has a displaced left femoral neck fracture.  We discussed treatment options including nonoperative and operative management.  We specifically reviewed open reduction internal fixation and closed pinning versus partial hip replacement total hip replacement.  Given the displacement of the fracture and after discussion, under shared decision making model, patient agreed with the plan to move forward with left hip hemiarthroplasty.  A long discussion took place with the patient  describing what a hemiarthroplasty is and what the procedure would entail. The xrays were reviewed with the patient and the implants were discussed. The ability to secure the implant utilizing cement/ or cementless (press fit) fixation was discussed. Surgical exposures were discussed with the patient.    The hospitalization and post-operative care and rehabilitation were also discussed. The use of perioperative antibiotics and DVT prophylaxis were discussed. The risk, benefits and alternatives to a surgical intervention were discussed at length with the patient. The patient was also advised of risks related to the medical comorbidities. A lengthy discussion took place to review the most common complications including but not limited to: deep vein thrombosis, pulmonary embolus, heart attack, stroke, infection, wound breakdown, dislocation, numbness, leg length in-equality, damage to nerves, intraoperative fracture, tendon,muscles, arteries or other blood vessels, death and other possible complications from anesthesia. The patient was told that we will take steps to minimize these risks by using sterile technique, antibiotics and DVT prophylaxis when appropriate and follow the patient postoperatively in the office setting to monitor progress. The possibility of recurrent pain, no improvement in pain and actual worsening of pain were also discussed with the patient.     The benefits of surgery were discussed with the patient including the potential for improving the patient's current clinical condition through operative intervention. Alternatives to surgical intervention including conservative management were also discussed in detail. All questions were answered to the satisfaction of the patient. The patient participated and agreed to the plan of care as well as the use of the recommended implants for their surgery.    Plan for surgery today 12/13/2023 N.p.o. for the operating room Hold anticoagulation  Consent  filled out and left on the chart Left hip was marked    Venus Ginsberg MD  Beeper #:  [phone]  12/13/2023 1:02 PM

## 2023-12-13 NOTE — Progress Notes (Signed)
 Initial Nutrition Assessment  DOCUMENTATION CODES:   Underweight, Severe malnutrition in context of chronic illness  INTERVENTION:   -Once diet is advanced:   -Ensure Enlive po TID, each supplement provides 350 kcal and 20 grams of protein -MVI with minerals daily  -Obtain new wt  NUTRITION DIAGNOSIS:   Severe Malnutrition related to chronic illness (COPD) as evidenced by severe muscle depletion, severe fat depletion.  GOAL:   Patient will meet greater than or equal to 90% of their needs  MONITOR:   PO intake, Supplement acceptance, Diet advancement  REASON FOR ASSESSMENT:   Consult Assessment of nutrition requirement/status, Hip fracture protocol  ASSESSMENT:   Pt with medical history significant of COPD, stroke with chronic ambulation impairment, squamous cell carcinoma back, lung cancer status post radiation and chemotherapy, gout, moderate protein calorie malnutrition, presented with left hip pain after fall.  Pt admitted with lt femoral neck fracture.   Per orthopedics notes, plan for surgery today. Pt currently NPO for procedure.   Spoke with pt at bedside, who was pleasant and in good spirits today. Pt shares that he is eager to have surgery so he can eat. Per pt, PTA he usually consumes at least once a day "whatever I can get my hands on". He reports concern that he just spend $350 worth of groceries right before his fall. Pt has no teeth and does not have access to dentures, but denies any difficulty chewing or swallowing food ("I don't have teeth because I guess someone needed them a lot more than I did").   Per pt, his UBW is around 110#. Per pt, he lost about 10# from his prior stroke and weight usually fluctuates from 104-108#.  Wt has been stable over the past month. However, noted last two weights are identical, so unsure if last wt was a stated vs actual weight. Will order another weight to better assess weight changes.   Discussed importance of good meal  and supplement intake to promote healing. Pt reports he has tried Ensure in the past, however, does not have access to it as much, as he can no longer purchase it with his HSA/FSA card. He is amenable to have supplements here and is eager to eat after surgery.   Medications reviewed and include protonix .   Labs reviewed.    NUTRITION - FOCUSED PHYSICAL EXAM:  Flowsheet Row Most Recent Value  Orbital Region Severe depletion  Upper Arm Region Severe depletion  Thoracic and Lumbar Region Severe depletion  Buccal Region Severe depletion  Temple Region Severe depletion  Clavicle Bone Region Severe depletion  Clavicle and Acromion Bone Region Severe depletion  Scapular Bone Region Severe depletion  Dorsal Hand Severe depletion  Patellar Region Severe depletion  Anterior Thigh Region Severe depletion  Posterior Calf Region Severe depletion  Edema (RD Assessment) None  Hair Reviewed  Eyes Reviewed  Mouth Reviewed  Skin Reviewed  Nails Reviewed       Diet Order:   Diet Order             Diet NPO time specified  Diet effective now                   EDUCATION NEEDS:   Education needs have been addressed  Skin:  Skin Assessment: Reviewed RN Assessment  Last BM:  12/13/23  Height:   Ht Readings from Last 1 Encounters:  12/13/23 5\' 9"  (1.753 m)    Weight:   Wt Readings from Last 1 Encounters:  12/13/23 48.1 kg    Ideal Body Weight:  72.7 kg  BMI:  Body mass index is 15.66 kg/m.  Estimated Nutritional Needs:   Kcal:  1900-2100  Protein:  100-115 grams  Fluid:  1.9-2.1 L    David [name], RD, LDN, CDCES Registered Dietitian III Certified Diabetes Care and Education Specialist If unable to reach this RD, please use "RD Inpatient" group chat on secure chat between hours of 8am-4 pm daily

## 2023-12-13 NOTE — Telephone Encounter (Signed)
 Copied from CRM [id number]. Topic: Clinical - Medical Advice >> Dec 13, 2023  8:20 AM Rosaria Common wrote: Reason for CRM: Pt believes he had a stroke on 5/2 and has not saught medical attention since. Warm transfer red word to nurse triage.  Chief Complaint: suspects stroke Symptoms: left hip pain 10/10, passed out and hit head on 5/2, slurred speech, shaking, "hurts every time I cough," coughing fits, "can't walk too good," SOB Frequency: continual Pertinent Negatives: Patient denies being able to sit up on his own, headache, dizziness, vision changes Disposition: [x] 911 / [] ED /[] Urgent Care (no appt availability in office) / [] Appointment(In office/virtual)/ []  [facility]/ [] Home Care/ [] Refused Recommended Disposition /[] [facility]/ []  Follow-up with PCP Additional Notes: Pt reporting that he thinks he had a stroke on Friday 5/2, having left-sided 10/10 hip pain, slurred speech, shaking at present, SOB with oxygen on "not helping," having coughing fits with chest congestion but sputum not coming up enough to spit out and assess color, "hurts every time I cough," "can't sit up" on his own at present. Pt confirms that he had stroke last year with similar symptoms. Advised strongly that call 911 for pt with his symptoms, pt allowing nurse to call 911 for him. Connected with 911, ambulance en route, stayed on line with pt until EMS arrived. EMS arrived, pt stated to EMS that primary concern was hip pain, advised pt tell them he thinks he's had a stroke, pt verbalized understanding and intent to do so. Disconnected with EMS talking to pt.  Reason for Disposition  [1] Loss of speech or garbled speech AND [2] sudden onset AND [3] present now  Answer Assessment - Initial Assessment Questions 1. SYMPTOM: "What is the main symptom you are concerned about?" (e.g., weakness, numbness)     Hurting on my left side, my hip, Can't sleep can't lay on it 2. ONSET: "When did this start?"  (minutes, hours, days; while sleeping)     5/2 5. CARDIAC SYMPTOMS: "Have you had any of the following symptoms: chest pain, difficulty breathing, palpitations?"     Still need oxygen tank, it's not helping 6. NEUROLOGIC SYMPTOMS: "Have you had any of the following symptoms: headache, dizziness, vision loss, double vision, changes in speech, unsteady on your feet?"     Passed out on Friday, hit head but head's alright, can't walk too good, changes in speech, shaking right now  Coughing fit, hurts every time I cough Didn't  Shaking Stroke last year bleeding on the brain, similar symptoms, I have some medicine Need something for the pain Can't sit up  Protocols used: Neurologic Deficit-A-AH

## 2023-12-14 ENCOUNTER — Encounter: Payer: Self-pay | Admitting: Orthopedic Surgery

## 2023-12-14 ENCOUNTER — Other Ambulatory Visit: Payer: Self-pay

## 2023-12-14 DIAGNOSIS — S72002A Fracture of unspecified part of neck of left femur, initial encounter for closed fracture: Secondary | ICD-10-CM | POA: Diagnosis not present

## 2023-12-14 LAB — CBC
HCT: 29.4 % — ABNORMAL LOW (ref 39.0–52.0)
Hemoglobin: 9.8 g/dL — ABNORMAL LOW (ref 13.0–17.0)
MCH: 29.5 pg (ref 26.0–34.0)
MCHC: 33.3 g/dL (ref 30.0–36.0)
MCV: 88.6 fL (ref 80.0–100.0)
Platelets: 205 10*3/uL (ref 150–400)
RBC: 3.32 MIL/uL — ABNORMAL LOW (ref 4.22–5.81)
RDW: 13.5 % (ref 11.5–15.5)
WBC: 8.4 10*3/uL (ref 4.0–10.5)
nRBC: 0 % (ref 0.0–0.2)

## 2023-12-14 LAB — BASIC METABOLIC PANEL WITH GFR
Anion gap: 8 (ref 5–15)
BUN: 13 mg/dL (ref 8–23)
CO2: 24 mmol/L (ref 22–32)
Calcium: 8.5 mg/dL — ABNORMAL LOW (ref 8.9–10.3)
Chloride: 100 mmol/L (ref 98–111)
Creatinine, Ser: 0.85 mg/dL (ref 0.61–1.24)
GFR, Estimated: >60 mL/min (ref >60)
Glucose, Bld: 162 mg/dL — ABNORMAL HIGH (ref 70–99)
Potassium: 4.2 mmol/L (ref 3.5–5.1)
Sodium: 132 mmol/L — ABNORMAL LOW (ref 135–145)

## 2023-12-14 LAB — IRON AND TIBC
Iron: 27 ug/dL — ABNORMAL LOW (ref 45–182)
Saturation Ratios: 11 % — ABNORMAL LOW (ref 17.9–39.5)
TIBC: 253 ug/dL (ref 250–450)
UIBC: 226 ug/dL

## 2023-12-14 LAB — FOLATE: Folate: 6.1 ng/mL (ref >5.9)

## 2023-12-14 LAB — VITAMIN B12: Vitamin B-12: 285 pg/mL (ref 180–914)

## 2023-12-14 MED ORDER — VITAMIN C 500 MG PO TABS
500.0000 mg | ORAL_TABLET | Freq: Every day | ORAL | 2 refills | Status: AC
  Filled 2023-12-14: qty 30, 30d supply, fill #0

## 2023-12-14 MED ORDER — ENOXAPARIN SODIUM 40 MG/0.4ML IJ SOSY: 40.0000 mg | PREFILLED_SYRINGE | INTRAMUSCULAR | 0 refills | Status: DC

## 2023-12-14 MED ORDER — METOPROLOL TARTRATE 25 MG PO TABS: 12.5000 mg | ORAL_TABLET | Freq: Two times a day (BID) | ORAL | Status: DC

## 2023-12-14 MED ORDER — VITAMIN D (ERGOCALCIFEROL) 1.25 MG (50000 UNIT) PO CAPS
50000.0000 [IU] | ORAL_CAPSULE | ORAL | 0 refills | Status: DC
  Filled 2023-12-14: qty 12, 84d supply, fill #0

## 2023-12-14 MED ORDER — IPRATROPIUM-ALBUTEROL 0.5-2.5 (3) MG/3ML IN SOLN: 3.0000 mL | Freq: Two times a day (BID) | RESPIRATORY_TRACT | Status: DC

## 2023-12-14 MED ORDER — BISACODYL 5 MG PO TBEC
10.0000 mg | DELAYED_RELEASE_TABLET | Freq: Every evening | ORAL | 0 refills | Status: DC | PRN
  Filled 2023-12-14: qty 30, 15d supply, fill #0

## 2023-12-14 MED ORDER — FOLIC ACID 1 MG PO TABS
1.0000 mg | ORAL_TABLET | Freq: Every day | ORAL | 0 refills | Status: AC
  Filled 2023-12-14: qty 30, 30d supply, fill #0

## 2023-12-14 MED ORDER — OXYCODONE HCL 5 MG PO TABS: 2.5000 mg | ORAL_TABLET | Freq: Four times a day (QID) | ORAL | 0 refills | Status: DC | PRN

## 2023-12-14 MED ORDER — POLYSACCHARIDE IRON COMPLEX 150 MG PO CAPS
150.0000 mg | ORAL_CAPSULE | Freq: Every day | ORAL | 2 refills | Status: DC
  Filled 2023-12-14: qty 30, 30d supply, fill #0

## 2023-12-14 MED ORDER — VITAMIN D (ERGOCALCIFEROL) 1.25 MG (50000 UNIT) PO CAPS
50000.0000 [IU] | ORAL_CAPSULE | ORAL | Status: DC
  Administered 2023-12-14: 50000 [IU] via ORAL
  Filled 2023-12-14: qty 1

## 2023-12-14 MED ORDER — TRAMADOL HCL 50 MG PO TABS: 50.0000 mg | ORAL_TABLET | Freq: Four times a day (QID) | ORAL | 0 refills | Status: DC | PRN

## 2023-12-14 MED ORDER — POLYETHYLENE GLYCOL 3350 17 GM/SCOOP PO POWD
17.0000 g | Freq: Every day | ORAL | 0 refills | Status: DC
  Filled 2023-12-14: qty 238, 14d supply, fill #0

## 2023-12-14 NOTE — Progress Notes (Signed)
   Subjective: 1 Day Post-Op Procedure(s) (LRB): HEMIARTHROPLASTY (BIPOLAR) HIP, POSTERIOR APPROACH FOR FRACTURE (Left) Patient reports pain as mild.   Patient is well, and has had no acute complaints or problems Denies any CP, SOB, ABD pain. We will continue therapy today.    Objective: Vital signs in last 24 hours: Temp:  [97.5 F (36.4 C)-98.9 F (37.2 C)] 97.7 F (36.5 C) (05/07 0801) Pulse Rate:  [58-95] 71 (05/07 0801) Resp:  [11-24] 17 (05/07 0801) BP: (101-137)/(60-80) 106/62 (05/07 0801) SpO2:  [93 %-100 %] 97 % (05/07 0801) Weight:  [48.1 kg] 48.1 kg (05/06 1533)  Intake/Output from previous day: 05/06 0701 - 05/07 0700 In: 1350 [P.O.:225; I.V.:900; IV Piggyback:225] Out: 290 [Urine:290] Intake/Output this shift: No intake/output data recorded.  Recent Labs    12/13/23 1130 12/14/23 0341  HGB 11.3* 9.8*   Recent Labs    12/13/23 1130 12/14/23 0341  WBC 5.8 8.4  RBC 3.86* 3.32*  HCT 34.6* 29.4*  PLT 233 205   Recent Labs    12/13/23 1130 12/14/23 0341  NA 138 132*  K 3.7 4.2  CL 103 100  CO2 26 24  BUN 10 13  CREATININE 0.78 0.85  GLUCOSE 73 162*  CALCIUM 8.7* 8.5*   Recent Labs    12/13/23 1130  INR 1.0    EXAM General - Patient is Alert, Appropriate, and Oriented Extremity - Neurovascular intact Sensation intact distally Intact pulses distally Dorsiflexion/Plantar flexion intact No cellulitis present Compartment soft Dressing - dressing C/D/I and no drainage Motor Function - intact, moving foot and toes well on exam.   Past Medical History:  Diagnosis Date   Adjustment disorder    Anemia    Arthritis    Asthma    Centrilobular emphysema (HCC)    Chest pain    Chronic bursitis    of left shoulder   Chronic pain of both shoulders    Chronic respiratory failure with hypoxia (HCC)    COPD (chronic obstructive pulmonary disease) (HCC)    not on home o2   GERD (gastroesophageal reflux disease)    Gout    History of kidney  stones    History of melanoma    Hypertension    Insomnia    Long term prescription opiate use    Multiple lung nodules    Oxygen deficiency    O2 1 LITER [facility]   Pain    chronic pain all over   Severe protein-calorie malnutrition (HCC)    Small cell lung cancer (HCC)    melanoma   Stroke (HCC) 2023   Tobacco abuse    Tobacco use     Assessment/Plan:   1 Day Post-Op Procedure(s) (LRB): HEMIARTHROPLASTY (BIPOLAR) HIP, POSTERIOR APPROACH FOR FRACTURE (Left) Principal Problem:   Hip fracture (HCC) Active Problems:   COPD (chronic obstructive pulmonary disease) (HCC)   Severe protein-calorie malnutrition (HCC)   Displaced fracture of left femoral neck (HCC)  Estimated body mass index is 15.66 kg/m as calculated from the following:   Height as of this encounter: 5\' 9"  (1.753 m).   Weight as of this encounter: 48.1 kg. Advance diet Up with therapy Pain well controlled VSS Hgb 9.8, recheck labs in the am CM to assist with discharge   DVT Prophylaxis - Lovenox , TED hose, and SCDs Weight-Bearing as tolerated to left leg   T. Thomos Flies, PA-C [facility] 12/14/2023, 8:31 AM

## 2023-12-14 NOTE — Progress Notes (Signed)
 1433 - Received secure chat message from pharmacy that medications for meds to beside were ready, patient was spoken to and he declined delivery to his room. He said he will pick up on his way out of the hospital.  1520 - Discharge instructions reviewed with patient. Patient acknowledged understanding. Patient discharged with personal belongings. Patient wheeled out by staff. No distress noted. Patient transported via family vehicle.

## 2023-12-14 NOTE — Plan of Care (Signed)
  Problem: Nutrition: Goal: Adequate nutrition will be maintained Outcome: Progressing   Problem: Pain Managment: Goal: General experience of comfort will improve and/or be controlled Outcome: Progressing   Problem: Pain Management: Goal: Pain level will decrease Outcome: Progressing

## 2023-12-14 NOTE — TOC Progression Note (Signed)
 Transition of Care [facility]) - Progression Note    Patient Details  Name: David [name] MRN: [id number] Date of Birth: 12-04-1955  Transition of Care [facility]) CM/SW Contact  Alexandra Ice, RN Phone Number: 12/14/2023, 12:36 PM  Clinical Narrative:     Received message from Sam Creighton with [employer], patient has account on hold that needs to be resolved before he can get [facility].        Expected Discharge Plan and Services                                               Social Determinants of Health (SDOH) Interventions SDOH Screenings   Food Insecurity: No Food Insecurity (12/13/2023)  Housing: Low Risk  (12/13/2023)  Transportation Needs: No Transportation Needs (12/13/2023)  Utilities: Not At Risk (12/13/2023)  Alcohol Screen: Low Risk  (06/10/2023)  Depression (PHQ2-9): Low Risk  (06/10/2023)  Recent Concern: Depression (PHQ2-9) - High Risk (04/20/2023)  Financial Resource Strain: Low Risk  (06/10/2023)  Recent Concern: Financial Resource Strain - Medium Risk (04/20/2023)  Physical Activity: Insufficiently Active (06/10/2023)  Social Connections: Moderately Isolated (12/13/2023)  Stress: No Stress Concern Present (06/10/2023)  Tobacco Use: High Risk (12/13/2023)  Health Literacy: Inadequate Health Literacy (07/27/2023)    Readmission Risk Interventions     No data to display

## 2023-12-14 NOTE — Evaluation (Signed)
 Physical Therapy Evaluation Patient Details Name: David [name] MRN: [id number] DOB: 06-04-1956 Today's Date: 12/14/2023  History of Present Illness  Pt admitted for L hip fx secondary to fall and is now s/p L hip hemi with post approach. Pt is POD 1. Other PMH includes COPD, squamous cell cancer, lung can s/p chemo and radiation.  Clinical Impression  Pt is a pleasant 68 year old male who was admitted for L hip fx and is now s/p L hip hemi. Educated on post hip precautions. Pt performs bed mobility with mod I, transfers with supervision, and ambulation with supervision using RW. Pt demonstrates deficits with strength/mobility/balance. Pt is doing extremely well and is hopeful to dc this date. Secure chat sent to team to coordinate needs. Would benefit from skilled PT to address above deficits and promote optimal return to PLOF. Pt will continue to receive skilled PT services while admitted and will defer to TOC/care team for updates regarding disposition planning.         If plan is discharge home, recommend the following: A little help with walking and/or transfers;Assist for transportation   Can travel by private vehicle        Equipment Recommendations BSC/3in1  Recommendations for Other Services       Functional Status Assessment Patient has had a recent decline in their functional status and demonstrates the ability to make significant improvements in function in a reasonable and predictable amount of time.     Precautions / Restrictions Precautions Precautions: Posterior Hip;Fall Precaution Booklet Issued: No Recall of Precautions/Restrictions: Intact Restrictions Weight Bearing Restrictions Per Provider Order: Yes RLE Weight Bearing Per Provider Order: Weight bearing as tolerated      Mobility  Bed Mobility Overal bed mobility: Modified Independent             General bed mobility comments: cues for post hip precautions. Safe technique    Transfers Overall  transfer level: Needs assistance Equipment used: Rolling walker (2 wheels) Transfers: Sit to/from Stand Sit to Stand: Supervision           General transfer comment: safe technique with upright posture    Ambulation/Gait Ambulation/Gait assistance: Supervision Gait Distance (Feet): 250 Feet Assistive device: Rolling walker (2 wheels) Gait Pattern/deviations: Step-through pattern       General Gait Details: ambulated with reciprocal gait pattern and able to carry conversation with exertion. No pain/dizziness noted  Stairs Stairs: Yes Stairs assistance: Contact guard assist Stair Management: Two rails, Alternating pattern Number of Stairs: 4 General stair comments: safe technique  Wheelchair Mobility     Tilt Bed    Modified Rankin (Stroke Patients Only)       Balance Overall balance assessment: Needs assistance Sitting-balance support: Feet supported Sitting balance-Leahy Scale: Good     Standing balance support: Bilateral upper extremity supported Standing balance-Leahy Scale: Good                               Pertinent Vitals/Pain Pain Assessment Pain Assessment: No/denies pain    Home Living Family/patient expects to be discharged to:: Private residence Living Arrangements: Other relatives Available Help at Discharge:  (lives with ex girlfriend and her boyfriend. Pt also reports he has some support from brother) Type of Home: House Home Access: Stairs to enter Entrance Stairs-Rails: Can reach both Entrance Stairs-Number of Steps: 3   Home Layout: One level Home Equipment: Agricultural consultant (2 wheels)  Prior Function Prior Level of Function : Independent/Modified Independent             Mobility Comments: indep prior, reports no other recent falls ADLs Comments: indep     Extremity/Trunk Assessment   Upper Extremity Assessment Upper Extremity Assessment: Overall WFL for tasks assessed    Lower Extremity  Assessment Lower Extremity Assessment: Generalized weakness (L LE grossly 4/5)       Communication   Communication Communication: No apparent difficulties    Cognition Arousal: Alert Behavior During Therapy: WFL for tasks assessed/performed   PT - Cognitive impairments: No apparent impairments                       PT - Cognition Comments: pleasant and agreeable to session Following commands: Intact       Cueing Cueing Techniques: Verbal cues     General Comments      Exercises     Assessment/Plan    PT Assessment Patient needs continued PT services  PT Problem List Decreased strength;Decreased mobility;Decreased knowledge of use of DME;Decreased safety awareness;Decreased knowledge of precautions       PT Treatment Interventions DME instruction;Gait training;Stair training;Therapeutic exercise;Balance training    PT Goals (Current goals can be found in the Care Plan section)  Acute Rehab PT Goals Patient Stated Goal: to go home today PT Goal Formulation: With patient Time For Goal Achievement: 12/28/23 Potential to Achieve Goals: Good    Frequency 7X/week     Co-evaluation               AM-PAC PT "6 Clicks" Mobility  Outcome Measure Help needed turning from your back to your side while in a flat bed without using bedrails?: None Help needed moving from lying on your back to sitting on the side of a flat bed without using bedrails?: None Help needed moving to and from a bed to a chair (including a wheelchair)?: A Little Help needed standing up from a chair using your arms (e.g., wheelchair or bedside chair)?: A Little Help needed to walk in hospital room?: A Little Help needed climbing 3-5 steps with a railing? : A Little 6 Click Score: 20    End of Session Equipment Utilized During Treatment: Gait belt Activity Tolerance: Patient tolerated treatment well Patient left: in chair;with chair alarm set;with call bell/phone within reach Nurse  Communication: Mobility status PT Visit Diagnosis: Muscle weakness (generalized) (M62.81);Difficulty in walking, not elsewhere classified (R26.2)    Time: 1000-1021 PT Time Calculation (min) (ACUTE ONLY): 21 min   Charges:   PT Evaluation $PT Eval Low Complexity: 1 Low PT Treatments $Gait Training: 8-22 mins PT General Charges $$ ACUTE PT VISIT: 1 Visit         Amparo Balk, PT, DPT, GCS [phone]   David [name] 12/14/2023, 10:57 AM

## 2023-12-14 NOTE — Discharge Summary (Signed)
 [facility] Discharge Summary   Patient: David [name]:[id number]  PCP: David Bunting, DO  Date of admission: 12/13/2023   Date of discharge:  12/14/2023     Discharge Diagnoses:  Principal Problem:   Hip fracture (HCC) Active Problems:   COPD (chronic obstructive pulmonary disease) (HCC)   Severe protein-calorie malnutrition (HCC)   Displaced fracture of left femoral neck (HCC)   Admitted From: Home Disposition:  Home   Recommendations for Outpatient Follow-up:  Follow-up with PCP in 1 week Follow-up with orthopedic surgery Follow up LABS/TEST:     Follow-up Information     David Derry, PA-C Follow up in 2 week(s).   Specialties: Orthopedic Surgery, Emergency Medicine Contact information: [address] [place] [address] [phone]                Diet recommendation: Regular diet  Activity: The patient is advised to gradually reintroduce usual activities, as tolerated  Discharge Condition: stable  Code Status: Full code   History of present illness: As per the H and P dictated on admission. Hospital Course:  David [name] is a 68 y.o. male with medical history significant of COPD, stroke with chronic ambulation impairment, squamous cell carcinoma back, lung cancer status post radiation and chemotherapy, gout, moderate protein calorie malnutrition, presented with left hip pain after fall.   At baseline, patient uses roller walker to ambulate since earlier this year for unsteady gait.  Last Friday, patient fell while walking down stairs and missed 2 steps.  Hit his left hip and started to have pain.  He went to his brother's house in the evening and fell again on left hip.  Over the weekend symptoms started get worse, unable to put weight on left hip this morning and decided to come to ED.  He denies any prodromes of lightheadedness palpitation shortness of breath before the event.   ED Course: Afebrile, no tachycardia blood  pressure 120/60 O2 saturation 100% on room air.  X-ray showed nondisplaced left proximal femoral neck fracture   # Left femoral neck fracture Secondary to mechanical fall Podiatry consulted, s/p ORIF done on 5/6, patient tolerated procedure well.  Pain is well-controlled.  Patient was seen by podiatry cleared for discharge.  Podiatry prescribed Lovenox  daily for 14 days, oxycodone  and tramadol  as needed. Patient was seen by PT and OT, recommended home health PT and OT which was arranged by TOC.  Patient was stable, he wanted to go home for discharge home today.   # COPD: Unstaged, appears to be advanced stage.  As patient does have a chronic cough with intermittent sputum production. x-ray showed no acute infiltrates. Continue LABA and ICS, DuoNebs every 6 hours,  As needed albuterol , Incentive spirometry  # History of stroke: No focal deficit, Aspirin  # Left arm skin lesion Suspect squamous cell carcinoma, outpatient dermatology follow-up for biopsy  # Severe protein calorie malnutrition - Ensure - Consult dietitian Body mass index is 15.66 kg/m.  Nutrition Problem: Severe Malnutrition Etiology: chronic illness (COPD) Nutrition Interventions: Interventions: Ensure Enlive (each supplement provides 350kcal and 20 grams of protein), MVI   Pain control  - Oxycodone , and tramadol  was prescribed by orthopedic surgery - Patient was instructed, not to drive, operate heavy machinery, perform activities at heights, swimming or participation in water activities or provide baby sitting services while on Pain, Sleep and Anxiety Medications; until his outpatient Physician has advised to do so again.  - Also recommended to not to take  more than prescribed Pain, Sleep and Anxiety Medications.  Patient was seen by physical therapy, who recommended Home health, which was arranged. On the day of the discharge the patient's vitals were stable, and no other acute medical condition were reported by  patient. the patient was felt safe to be discharge at Home with Home health.  Consultants: Orthopedic surgery Procedures: Left ORIF  Discharge Exam: General: Appear in no distress, no Rash; Oral Mucosa Clear, moist. Cardiovascular: S1 and S2 Present, no Murmur, Respiratory: normal respiratory effort, Bilateral Air entry present and no Crackles, no wheezes Abdomen: Bowel Sound present, Soft and no tenderness, no hernia Extremities: s/p Left ORIF, dressing CDI, no Pedal edema, no calf tenderness Neurology: alert and oriented to time, place, and person affect appropriate.  Filed Weights   12/13/23 0940 12/13/23 1533  Weight: 48.1 kg 48.1 kg   Vitals:   12/14/23 0801 12/14/23 1311  BP: 106/62 111/64  Pulse: 71 85  Resp: 17 18  Temp: 97.7 F (36.5 C) 99.1 F (37.3 C)  SpO2: 97% 98%    DISCHARGE MEDICATION: Allergies as of 12/14/2023       Reactions   Tylenol  [acetaminophen ] Other (See Comments)   Unable to take due to chemotherapy regimen   Vicodin [hydrocodone -acetaminophen ] Nausea And Vomiting, Rash        Medication List     STOP taking these medications    Breztri  Aerosphere 160-9-4.8 MCG/ACT Aero inhaler Generic drug: budeson-glycopyrrolate-formoterol    metoprolol  tartrate 25 MG tablet Commonly known as: LOPRESSOR        TAKE these medications    albuterol  108 (90 Base) MCG/ACT inhaler Commonly known as: Ventolin  HFA Inhale 2 puffs into the lungs every 4 (four) hours as needed for wheezing or shortness of breath.   ascorbic acid 500 MG tablet Commonly known as: VITAMIN C Take 1 tablet (500 mg total) by mouth daily.   bisacodyl  5 MG EC tablet Commonly known as: DULCOLAX Take 2 tablets (10 mg total) by mouth at bedtime as needed for moderate constipation.   enoxaparin  40 MG/0.4ML injection Commonly known as: LOVENOX  Inject 0.4 mLs (40 mg total) into the skin daily for 14 days. Start taking on: Dec 15, 2023   folic acid 1 MG tablet Commonly known as:  FOLVITE Take 1 tablet (1 mg total) by mouth daily.   iron polysaccharides 150 MG capsule Commonly known as: NIFEREX Take 1 capsule (150 mg total) by mouth daily.   mirtazapine  15 MG tablet Commonly known as: REMERON  Take 1 tablet (15 mg total) by mouth at bedtime.   mupirocin  ointment 2 % Commonly known as: BACTROBAN  Apply 1 Application topically 2 (two) times daily. For 1-2 weeks until affected area healed   naproxen  500 MG tablet Commonly known as: Naprosyn  Take 1 tablet (500 mg total) by mouth 2 (two) times daily with a meal.   oxyCODONE  5 MG immediate release tablet Commonly known as: Oxy IR/ROXICODONE  Take 0.5 tablets (2.5 mg total) by mouth every 6 (six) hours as needed for breakthrough pain.   pantoprazole  40 MG tablet Commonly known as: PROTONIX  TAKE 1 TABLET BY MOUTH EVERY DAY BEFORE BREAKFAST   polyethylene glycol 17 g packet Commonly known as: MiraLax  Take 17 g by mouth daily.   traMADol  50 MG tablet Commonly known as: ULTRAM  Take 1 tablet (50 mg total) by mouth every 6 (six) hours as needed for moderate pain (pain score 4-6).   Vitamin D (Ergocalciferol) 1.25 MG (50000 UNIT) Caps capsule Commonly known as: DRISDOL  Take 1 capsule (50,000 Units total) by mouth every 7 (seven) days. Start taking on: Dec 21, 2023               Durable Medical Equipment  (From admission, onward)           Start     Ordered   12/14/23 1039  For home use only DME Bedside commode  Once       Question:  Patient needs a bedside commode to treat with the following condition  Answer:  Closed left hip fracture (HCC)   12/14/23 1039           Allergies  Allergen Reactions   Tylenol  [Acetaminophen ] Other (See Comments)    Unable to take due to chemotherapy regimen   Vicodin [Hydrocodone -Acetaminophen ] Nausea And Vomiting and Rash   Discharge Instructions     Call MD for:  difficulty breathing, headache or visual disturbances   Complete by: As directed    Call MD  for:  extreme fatigue   Complete by: As directed    Call MD for:  persistant dizziness or light-headedness   Complete by: As directed    Call MD for:  persistant nausea and vomiting   Complete by: As directed    Call MD for:  redness, tenderness, or signs of infection (pain, swelling, redness, odor or green/yellow discharge around incision site)   Complete by: As directed    Call MD for:  severe uncontrolled pain   Complete by: As directed    Call MD for:  temperature >100.4   Complete by: As directed    Diet - low sodium heart healthy   Complete by: As directed    Discharge instructions   Complete by: As directed    Follow-up with PCP in 1 week Follow-up with orthopedic surgery   Increase activity slowly   Complete by: As directed        The results of significant diagnostics from this hospitalization (including imaging, microbiology, ancillary and laboratory) are listed below for reference.    Significant Diagnostic Studies: DG Pelvis Portable Result Date: 12/13/2023 CLINICAL DATA:  Postoperative.  Elective surgery. EXAM: PORTABLE PELVIS 1-2 VIEWS COMPARISON:  [facility] 12/02/2015, pelvis and left hip radiographs 12/13/2023, CT left hip 12/13/2023 FINDINGS: There is diffuse decreased bone mineralization. Interval left hip bipolar hemiarthroplasty. No perihardware lucency is seen to indicate hardware failure or loosening. Expected postoperative changes including lateral left hip subcutaneous air and intra-articular air. Mild bilateral sacroiliac subchondral sclerosis. The right femoroacetabular joint space is maintained. No acute fracture or dislocation. Mild to moderate atherosclerotic calcifications. IMPRESSION: Interval left hip bipolar hemiarthroplasty without evidence of hardware failure. Electronically Signed   By: Bertina Broccoli M.D.   On: 12/13/2023 19:28   CT Hip Left Wo Contrast Result Date: 12/13/2023 CLINICAL DATA:  Fall 4 days ago.  Left hip fracture. EXAM: CT OF THE LEFT HIP  WITHOUT CONTRAST TECHNIQUE: Multidetector CT imaging of the left hip was performed according to the standard protocol. Multiplanar CT image reconstructions were also generated. RADIATION DOSE REDUCTION: This exam was performed according to the departmental dose-optimization program which includes automated exposure control, adjustment of the mA and/or kV according to patient size and/or use of iterative reconstruction technique. COMPARISON:  Radiographs 12/13/2023 and 10/05/2021. FINDINGS: Bones/Joint/Cartilage The bones appear adequately mineralized. Subcapital fracture of the left femoral neck demonstrates mild impaction, mild apex anterior angulation and mild anterior displacement of (up to 1.1 cm). There is no involvement of the femoral head  articular surface. There is no intertrochanteric extension of the fracture. The femoral head remains located. No evidence of fracture of the left hemipelvis. Minimal underlying left hip degenerative changes. There is advanced disc space narrowing with endplate osteophytes and vacuum phenomenon at L5-S1. Ligaments Suboptimally assessed by CT. Muscles and Tendons The visualized left hip and pelvic muscles appear unremarkable. No focal muscular atrophy or significant hematoma. Soft tissues No evidence of periarticular hematoma, foreign body or soft tissue emphysema. No significant hip joint effusion. Aortic, iliac and femoral atherosclerosis noted. There are diverticular changes within the distal colon. IMPRESSION: 1. Mildly impacted, angulated and displaced subcapital fracture of the left femoral neck as described. No involvement of the femoral head articular surface or intertrochanteric extension. 2. No evidence of periarticular hematoma or soft tissue emphysema. 3. Advanced degenerative disc disease at L5-S1. 4.  Aortic Atherosclerosis (ICD10-I70.0). Electronically Signed   By: Elmon Hagedorn M.D.   On: 12/13/2023 11:50   DG Chest Portable 1 View Result Date:  12/13/2023 CLINICAL DATA:  Preop hip fracture, history of COPD EXAM: PORTABLE CHEST 1 VIEW COMPARISON:  None Available. FINDINGS: Lungs are hyperexpanded with interstitial prominence. The previously observed right central mid lung zone density is different in appearance and slightly more confluent. No significant pleural effusion and no evidence of pneumothorax. IMPRESSION: 1. Imaging features of COPD and chronic lung disease. 2. More confluent appearance of right mid lung zone lesion. This was previously characterized by CT on June 22, 2021. Consideration should be given toward follow-up evaluation by chest CT which can be performed as an outpatient to more accurately characterize change and malignant potential. 3. No pleural effusion or pneumothorax. Electronically Signed   By: Reagan Camera M.D.   On: 12/13/2023 11:26   DG Hip Unilat W or Wo Pelvis 2-3 Views Left Result Date: 12/13/2023 CLINICAL DATA:  Left hip pain after fall several days ago. EXAM: DG HIP (WITH OR WITHOUT PELVIS) 2-3V LEFT COMPARISON:  October 05, 2021. FINDINGS: Mildly displaced proximal left femoral neck fracture is noted. IMPRESSION: Mildly displaced proximal left femoral neck fracture. Electronically Signed   By: Rosalene Colon M.D.   On: 12/13/2023 10:48   CT Head Wo Contrast Result Date: 12/13/2023 CLINICAL DATA:  Fall EXAM: CT HEAD WITHOUT CONTRAST CT CERVICAL SPINE WITHOUT CONTRAST TECHNIQUE: Multidetector CT imaging of the head and cervical spine was performed following the standard protocol without intravenous contrast. Multiplanar CT image reconstructions of the cervical spine were also generated. RADIATION DOSE REDUCTION: This exam was performed according to the departmental dose-optimization program which includes automated exposure control, adjustment of the mA and/or kV according to patient size and/or use of iterative reconstruction technique. COMPARISON:  04/20/2022 FINDINGS: CT HEAD FINDINGS Brain: No evidence of acute  infarction, hemorrhage, hydrocephalus, extra-axial collection or mass lesion/mass effect. Periventricular white matter hypodensity. Vascular: No hyperdense vessel or unexpected calcification. Skull: Normal. Negative for fracture or focal lesion. Sinuses/Orbits: No acute finding. Other: None. CT CERVICAL SPINE FINDINGS Alignment: Degenerative straightening of the normal cervical lordosis. Skull base and vertebrae: No acute fracture. No primary bone lesion or focal pathologic process. Soft tissues and spinal canal: No prevertebral fluid or swelling. No visible canal hematoma. Disc levels: Focally moderate disc degenerative disease and osteophytosis of C6-C7 with otherwise minimal disc degenerative change. Upper chest: Negative. Other: None. IMPRESSION: 1. No acute intracranial pathology. Small-vessel white matter disease. 2. No fracture or static subluxation of the cervical spine. 3. Focally moderate disc degenerative disease and osteophytosis of C6-C7 with  otherwise minimal disc degenerative change. Electronically Signed   By: Fredricka Jenny M.D.   On: 12/13/2023 10:18   CT Cervical Spine Wo Contrast Result Date: 12/13/2023 CLINICAL DATA:  Fall EXAM: CT HEAD WITHOUT CONTRAST CT CERVICAL SPINE WITHOUT CONTRAST TECHNIQUE: Multidetector CT imaging of the head and cervical spine was performed following the standard protocol without intravenous contrast. Multiplanar CT image reconstructions of the cervical spine were also generated. RADIATION DOSE REDUCTION: This exam was performed according to the departmental dose-optimization program which includes automated exposure control, adjustment of the mA and/or kV according to patient size and/or use of iterative reconstruction technique. COMPARISON:  04/20/2022 FINDINGS: CT HEAD FINDINGS Brain: No evidence of acute infarction, hemorrhage, hydrocephalus, extra-axial collection or mass lesion/mass effect. Periventricular white matter hypodensity. Vascular: No hyperdense vessel  or unexpected calcification. Skull: Normal. Negative for fracture or focal lesion. Sinuses/Orbits: No acute finding. Other: None. CT CERVICAL SPINE FINDINGS Alignment: Degenerative straightening of the normal cervical lordosis. Skull base and vertebrae: No acute fracture. No primary bone lesion or focal pathologic process. Soft tissues and spinal canal: No prevertebral fluid or swelling. No visible canal hematoma. Disc levels: Focally moderate disc degenerative disease and osteophytosis of C6-C7 with otherwise minimal disc degenerative change. Upper chest: Negative. Other: None. IMPRESSION: 1. No acute intracranial pathology. Small-vessel white matter disease. 2. No fracture or static subluxation of the cervical spine. 3. Focally moderate disc degenerative disease and osteophytosis of C6-C7 with otherwise minimal disc degenerative change. Electronically Signed   By: Fredricka Jenny M.D.   On: 12/13/2023 10:18    Microbiology: No results found for this or any previous visit (from the past 240 hours).   Labs: CBC: Recent Labs  Lab 12/13/23 1130 12/14/23 0341  WBC 5.8 8.4  NEUTROABS 3.7  --   HGB 11.3* 9.8*  HCT 34.6* 29.4*  MCV 89.6 88.6  PLT 233 205   Basic Metabolic Panel: Recent Labs  Lab 12/13/23 1130 12/14/23 0341  NA 138 132*  K 3.7 4.2  CL 103 100  CO2 26 24  GLUCOSE 73 162*  BUN 10 13  CREATININE 0.78 0.85  CALCIUM 8.7* 8.5*   Liver Function Tests: No results for input(s): "AST", "ALT", "ALKPHOS", "BILITOT", "PROT", "ALBUMIN" in the last 168 hours. No results for input(s): "LIPASE", "AMYLASE" in the last 168 hours. No results for input(s): "AMMONIA" in the last 168 hours. Cardiac Enzymes: No results for input(s): "CKTOTAL", "CKMB", "CKMBINDEX", "TROPONINI" in the last 168 hours. BNP (last 3 results) No results for input(s): "BNP" in the last 8760 hours. CBG: No results for input(s): "GLUCAP" in the last 168 hours.  Time spent: 35 minutes  Signed:  Althia Atlas  [facility] 12/14/2023 1:33 PM

## 2023-12-14 NOTE — Evaluation (Signed)
 Occupational Therapy Evaluation Patient Details Name: David [name] MRN: [id number] DOB: 12/18/1955 Today's Date: 12/14/2023   History of Present Illness   Pt admitted for L hip fx secondary to fall and is now s/p L hip hemi with post approach. Pt is POD 1. Other PMH includes COPD, squamous cell cancer, lung can s/p chemo and radiation.    Clinical Impressions Mr Calendine was seen for OT evaluation this date. Prior to hospital admission, pt was IND. Pt lives with ex-girlfriend. Pt currently requires MIN A don underwear/pants - assist to thread over L foot to maintain posterior hip pcns. SUPERVISION + RW for funcitonal mobility ~200 ft including managing doors and narrow spaces. Pt would benefit from skilled OT to address noted impairments and functional limitations (see below for any additional details). Upon hospital discharge, recommend OT follow up.     If plan is discharge home, recommend the following:   A little help with walking and/or transfers;A little help with bathing/dressing/bathroom;Supervision due to cognitive status     Functional Status Assessment   Patient has had a recent decline in their functional status and demonstrates the ability to make significant improvements in function in a reasonable and predictable amount of time.     Equipment Recommendations   BSC/3in1     Recommendations for Other Services         Precautions/Restrictions   Precautions Precautions: Posterior Hip;Fall Precaution Booklet Issued: No Recall of Precautions/Restrictions: Intact Restrictions Weight Bearing Restrictions Per Provider Order: Yes RLE Weight Bearing Per Provider Order: Weight bearing as tolerated     Mobility Bed Mobility               General bed mobility comments: not tested    Transfers Overall transfer level: Needs assistance Equipment used: Rolling walker (2 wheels) Transfers: Sit to/from Stand Sit to Stand: Supervision                   Balance Overall balance assessment: Needs assistance Sitting-balance support: Feet supported Sitting balance-Leahy Scale: Normal     Standing balance support: No upper extremity supported, During functional activity Standing balance-Leahy Scale: Good                             ADL either performed or assessed with clinical judgement   ADL Overall ADL's : Needs assistance/impaired                                       General ADL Comments: MIN A don underwear/pants - assist to thread over L foot to maintain posterior hip pcns. SUPERVISION + RW for funcitonal mobility ~200 ft including managing doors and narrow spaces      Pertinent Vitals/Pain Pain Assessment Pain Assessment: No/denies pain     Extremity/Trunk Assessment Upper Extremity Assessment Upper Extremity Assessment: Overall WFL for tasks assessed   Lower Extremity Assessment Lower Extremity Assessment: Generalized weakness       Communication Communication Communication: No apparent difficulties   Cognition Arousal: Alert Behavior During Therapy: Impulsive, WFL for tasks assessed/performed Cognition: No family/caregiver present to determine baseline                               Following commands: Intact       Cueing  General Comments  Cueing Techniques: Verbal cues      Exercises     Shoulder Instructions      Home Living Family/patient expects to be discharged to:: Private residence Living Arrangements: Other relatives Available Help at Discharge:  (lives with ex girlfriend and her boyfriend. Pt also reports he has some support from brother) Type of Home: House Home Access: Stairs to enter [employer] of Steps: 3 Entrance Stairs-Rails: Can reach both Home Layout: One level               Home Equipment: Agricultural consultant (2 wheels)          Prior Functioning/Environment Prior Level of Function : Independent/Modified  Independent             Mobility Comments: indep prior, reports no other recent falls ADLs Comments: indep    OT Problem List: Decreased range of motion;Decreased activity tolerance;Impaired balance (sitting and/or standing)   OT Treatment/Interventions: Self-care/ADL training;Therapeutic exercise;Energy conservation;DME and/or AE instruction;Therapeutic activities      OT Goals(Current goals can be found in the care plan section)   Acute Rehab OT Goals Patient Stated Goal: to go home OT Goal Formulation: With patient Time For Goal Achievement: 12/28/23 Potential to Achieve Goals: Good ADL Goals Pt Will Perform Lower Body Dressing: with modified independence;sit to/from stand Pt Will Transfer to Toilet: with modified independence;ambulating;regular height toilet   OT Frequency:  Min 1X/week    Co-evaluation              AM-PAC OT "6 Clicks" Daily Activity     Outcome Measure Help from another person eating meals?: None Help from another person taking care of personal grooming?: None Help from another person toileting, which includes using toliet, bedpan, or urinal?: A Lot Help from another person bathing (including washing, rinsing, drying)?: A Lot Help from another person to put on and taking off regular upper body clothing?: None Help from another person to put on and taking off regular lower body clothing?: A Little 6 Click Score: 19   End of Session Equipment Utilized During Treatment: Rolling walker (2 wheels)  Activity Tolerance: Patient tolerated treatment well Patient left: in chair;with call bell/phone within reach;with chair alarm set  OT Visit Diagnosis: Other abnormalities of gait and mobility (R26.89);Muscle weakness (generalized) (M62.81)                Time: 1610-9604 OT Time Calculation (min): 14 min Charges:  OT General Charges $OT Visit: 1 Visit OT Evaluation $OT Eval Low Complexity: 1 Low  Gordan Latina, M.S. OTR/L  12/14/23, 12:37 PM   ascom [phone]

## 2023-12-14 NOTE — Discharge Instructions (Addendum)
 [facility]  They will call you to schedule when they will come out to see you for nursing and Physical Therapy Home health care service [address]  [phone] Open ? Closes 5?PM  Please contact [employer] billing department at [phone] to resolve your account.

## 2023-12-14 NOTE — TOC Progression Note (Signed)
 Transition of Care [facility]) - Progression Note    Patient Details  Name: David [name] MRN: [id number] Date of Birth: 1955-11-19  Transition of Care [facility] CM/SW Contact  Alexandra Ice, RN Phone Number: 12/14/2023, 1:59 PM  Clinical Narrative:    Met with patient at bedside, stated he has DME at home. He stated he attempted to return oxygen but they never came to get and he does not know where the concentrator is now. He had to get out of the home. TOC explained he has hold on account that he needs to addressed and will put the billing contact number on his AVS. He verbalized understanding.  Notified Jon with [employer] to cancel [facility] request.  Home health referral sent to [facility] able to accept, added to AVS. Notified he is discharging today. Updated MD and bedside nurse.        Expected Discharge Plan and Services         Expected Discharge Date: 12/14/23                                     Social Determinants of Health (SDOH) Interventions SDOH Screenings   Food Insecurity: No Food Insecurity (12/13/2023)  Housing: Low Risk  (12/13/2023)  Transportation Needs: No Transportation Needs (12/13/2023)  Utilities: Not At Risk (12/13/2023)  Alcohol Screen: Low Risk  (06/10/2023)  Depression (PHQ2-9): Low Risk  (06/10/2023)  Recent Concern: Depression (PHQ2-9) - High Risk (04/20/2023)  Financial Resource Strain: Low Risk  (06/10/2023)  Recent Concern: Financial Resource Strain - Medium Risk (04/20/2023)  Physical Activity: Insufficiently Active (06/10/2023)  Social Connections: Moderately Isolated (12/13/2023)  Stress: No Stress Concern Present (06/10/2023)  Tobacco Use: High Risk (12/13/2023)  Health Literacy: Inadequate Health Literacy (07/27/2023)    Readmission Risk Interventions     No data to display

## 2023-12-14 NOTE — Plan of Care (Signed)

## 2023-12-14 NOTE — Progress Notes (Signed)
 Patient is not able to walk the distance required to go the bathroom, or he/she is unable to safely negotiate stairs required to access the bathroom.  A 3in1 BSC will alleviate this problem

## 2023-12-22 ENCOUNTER — Encounter: Payer: Self-pay | Admitting: Family Medicine

## 2023-12-22 ENCOUNTER — Ambulatory Visit: Admitting: Family Medicine

## 2023-12-22 VITALS — BP 96/48 | HR 92 | Ht 69.0 in | Wt 103.0 lb

## 2023-12-22 DIAGNOSIS — S72002A Fracture of unspecified part of neck of left femur, initial encounter for closed fracture: Secondary | ICD-10-CM

## 2023-12-22 DIAGNOSIS — J432 Centrilobular emphysema: Secondary | ICD-10-CM

## 2023-12-22 DIAGNOSIS — Z8781 Personal history of (healed) traumatic fracture: Secondary | ICD-10-CM

## 2023-12-22 DIAGNOSIS — Z7901 Long term (current) use of anticoagulants: Secondary | ICD-10-CM | POA: Diagnosis not present

## 2023-12-22 MED ORDER — ALBUTEROL SULFATE HFA 108 (90 BASE) MCG/ACT IN AERS
2.0000 | INHALATION_SPRAY | RESPIRATORY_TRACT | 5 refills | Status: DC | PRN
Start: 2023-12-22 — End: 2024-03-16

## 2023-12-22 MED ORDER — RIVAROXABAN 10 MG PO TABS: 10.0000 mg | ORAL_TABLET | Freq: Every day | ORAL | 0 refills | Status: DC

## 2023-12-22 NOTE — Patient Instructions (Addendum)
 Thank you for coming to the office today.  Start Xarelto 10mg  daily for 7 days  Orthopedic apt on 5/21  Albuterol  inhaler  Our nursing/ pharmacy team will try again to contact you   Please schedule a Follow-up Appointment to: Return if symptoms worsen or fail to improve.  If you have any other questions or concerns, please feel free to call the office or send a message through MyChart. You may also schedule an earlier appointment if necessary.  Additionally, you may be receiving a survey about your experience at our office within a few days to 1 week by e-mail or mail. We value your feedback.  Domingo Friend, DO [facility], [place]

## 2023-12-22 NOTE — Progress Notes (Signed)
 Subjective:    Patient ID: David [name], male    DOB: [birth year], 68 y.o.   MRN: [id number]  David [name] is a 67 y.o. male presenting on 12/22/2023 for Hip Injury (Hip fracture)   HPI  Discussed the use of AI scribe software for clinical note transcription with the patient, who gave verbal consent to proceed.  History of Present Illness   David [name] is a 68 year old male who presents for follow-up after left hip fracture and surgery.  He experienced a fall on Dec 09, 2023, resulting in a left hip fracture. The fall occurred while getting out of a car, and he suspects a possible stroke or episode of weakness may have contributed. He was admitted to the hospital on Dec 13, 2023, and underwent surgical repair of the fracture. He was discharged on Dec 14, 2023.  Post-surgery, he was prescribed Lovenox  for 14 days to prevent blood clots. He encountered difficulties with the Lovenox  injections, as the needles were breaking, causing fear of use. He managed to use one injection, but two attempts were unsuccessful. He has skipped dosing for past week. Request pills for anticoagulation  He is currently out of his inhalers, specifically needing a rescue inhaler. He recalls an Airsupra sample inhaler, which was effective  He has updated his contact information to ensure better communication with healthcare providers, as there were previous issues with receiving calls.     Upcoming Orthopedic follow-up [facility] 5/21      06/10/2023    3:16 PM 04/20/2023    3:47 PM 09/18/2021    8:29 AM  Depression screen PHQ 2/9  Decreased Interest 0 0 0  Down, Depressed, Hopeless 0 3 3  PHQ - 2 Score 0 3 3  Altered sleeping 0 3 3  Tired, decreased energy 0 3 3  Change in appetite 0 3 3  Feeling bad or failure about yourself  0 3 3  Trouble concentrating 0 0 0  Moving slowly or fidgety/restless 0 0 0  Suicidal thoughts 0 0 0  PHQ-9 Score 0 15 15  Difficult doing work/chores Not difficult at  all Not difficult at all Not difficult at all       09/18/2021    8:30 AM  GAD 7 : Generalized Anxiety Score  Nervous, Anxious, on Edge 0  Control/stop worrying 0  Worry too much - different things 0  Trouble relaxing 0  Restless 3  Easily annoyed or irritable 3  Afraid - awful might happen 3  Total GAD 7 Score 9  Anxiety Difficulty Not difficult at all    Social History   Tobacco Use   Smoking status: Every Day    Current packs/day: 1.00    Average packs/day: 1 pack/day for 51.9 years (51.9 ttl pk-yrs)    Types: Cigarettes    Start date: 08/12/1994   Smokeless tobacco: Never   Tobacco comments:    Smoking 1 ppd s of 11/12/2021 hfb   Vaping Use   Vaping status: Never Used  Substance Use Topics   Alcohol use: No    Alcohol/week: 0.0 standard drinks of alcohol   Drug use: Yes    Types: "Crack" cocaine    Comment: 02/14/23    Review of Systems Per HPI unless specifically indicated above     Objective:     BP (!) 96/48 (BP Location: Left Arm, Patient Position: Sitting, Cuff Size: Normal)   Pulse 92   Ht 5\' 9"  (1.753  m)   Wt 103 lb (46.7 kg)   SpO2 97%   BMI 15.21 kg/m   Wt Readings from Last 3 Encounters:  12/22/23 103 lb (46.7 kg)  12/13/23 106 lb 0.7 oz (48.1 kg)  11/04/23 106 lb (48.1 kg)    Physical Exam Vitals and nursing note reviewed.  Constitutional:      General: He is not in acute distress.    Appearance: Normal appearance. He is well-developed. He is not diaphoretic.     Comments: Well-appearing, comfortable, cooperative  HENT:     Head: Normocephalic and atraumatic.  Eyes:     General:        Right eye: No discharge.        Left eye: No discharge.     Conjunctiva/sclera: Conjunctivae normal.  Cardiovascular:     Rate and Rhythm: Normal rate.  Pulmonary:     Effort: Pulmonary effort is normal.  Musculoskeletal:     Comments: He is able to ambulate today without assistance.  Skin:    General: Skin is warm and dry.     Findings: Lesion  (forearm, non healing ulcerations persistent) present. No erythema or rash.  Neurological:     Mental Status: He is alert and oriented to person, place, and time.  Psychiatric:        Mood and Affect: Mood normal.        Behavior: Behavior normal.        Thought Content: Thought content normal.     Comments: Well groomed, good eye contact, normal speech and thoughts     Results for orders placed or performed during the hospital encounter of 12/13/23  CBC with Differential/Platelet   Collection Time: 12/13/23 11:30 AM  Result Value Ref Range   WBC 5.8 4.0 - 10.5 K/uL   RBC 3.86 (L) 4.22 - 5.81 MIL/uL   Hemoglobin 11.3 (L) 13.0 - 17.0 g/dL   HCT 41.3 (L) 24.4 - 01.0 %   MCV 89.6 80.0 - 100.0 fL   MCH 29.3 26.0 - 34.0 pg   MCHC 32.7 30.0 - 36.0 g/dL   RDW 27.2 53.6 - 64.4 %   Platelets 233 150 - 400 K/uL   nRBC 0.0 0.0 - 0.2 %   Neutrophils Relative % 62 %   Neutro Abs 3.7 1.7 - 7.7 K/uL   Lymphocytes Relative 22 %   Lymphs Abs 1.3 0.7 - 4.0 K/uL   Monocytes Relative 9 %   Monocytes Absolute 0.5 0.1 - 1.0 K/uL   Eosinophils Relative 5 %   Eosinophils Absolute 0.3 0.0 - 0.5 K/uL   Basophils Relative 1 %   Basophils Absolute 0.0 0.0 - 0.1 K/uL   Immature Granulocytes 1 %   Abs Immature Granulocytes 0.04 0.00 - 0.07 K/uL  Basic metabolic panel   Collection Time: 12/13/23 11:30 AM  Result Value Ref Range   Sodium 138 135 - 145 mmol/L   Potassium 3.7 3.5 - 5.1 mmol/L   Chloride 103 98 - 111 mmol/L   CO2 26 22 - 32 mmol/L   Glucose, Bld 73 70 - 99 mg/dL   BUN 10 8 - 23 mg/dL   Creatinine, Ser 0.34 0.61 - 1.24 mg/dL   Calcium 8.7 (L) 8.9 - 10.3 mg/dL   GFR, Estimated >74 >25 mL/min   Anion gap 9 5 - 15  Protime-INR   Collection Time: 12/13/23 11:30 AM  Result Value Ref Range   Prothrombin Time 13.0 11.4 - 15.2 seconds   INR  1.0 0.8 - 1.2  APTT   Collection Time: 12/13/23 11:30 AM  Result Value Ref Range   aPTT 35 24 - 36 seconds  Type and screen   Collection Time:  12/13/23 12:05 PM  Result Value Ref Range   ABO/RH(D) A POS    Antibody Screen NEG    Sample Expiration      12/16/2023,2359 Performed at [facility], [address] [place], [place] [address]   VITAMIN D  25 Hydroxy (Vit-D Deficiency, Fractures)   Collection Time: 12/13/23  1:30 PM  Result Value Ref Range   Vit D, 25-Hydroxy 29.99 (L) 30 - 100 ng/mL  HIV Antibody (routine testing w rflx)   Collection Time: 12/13/23  1:30 PM  Result Value Ref Range   HIV Screen 4th Generation wRfx Non Reactive Non Reactive  CBC   Collection Time: 12/14/23  3:41 AM  Result Value Ref Range   WBC 8.4 4.0 - 10.5 K/uL   RBC 3.32 (L) 4.22 - 5.81 MIL/uL   Hemoglobin 9.8 (L) 13.0 - 17.0 g/dL   HCT 24.4 (L) 01.0 - 27.2 %   MCV 88.6 80.0 - 100.0 fL   MCH 29.5 26.0 - 34.0 pg   MCHC 33.3 30.0 - 36.0 g/dL   RDW 53.6 64.4 - 03.4 %   Platelets 205 150 - 400 K/uL   nRBC 0.0 0.0 - 0.2 %  Basic metabolic panel   Collection Time: 12/14/23  3:41 AM  Result Value Ref Range   Sodium 132 (L) 135 - 145 mmol/L   Potassium 4.2 3.5 - 5.1 mmol/L   Chloride 100 98 - 111 mmol/L   CO2 24 22 - 32 mmol/L   Glucose, Bld 162 (H) 70 - 99 mg/dL   BUN 13 8 - 23 mg/dL   Creatinine, Ser 7.42 0.61 - 1.24 mg/dL   Calcium 8.5 (L) 8.9 - 10.3 mg/dL   GFR, Estimated >59 >56 mL/min   Anion gap 8 5 - 15  Iron  and TIBC   Collection Time: 12/14/23 10:07 AM  Result Value Ref Range   Iron  27 (L) 45 - 182 ug/dL   TIBC 387 564 - 332 ug/dL   Saturation Ratios 11 (L) 17.9 - 39.5 %   UIBC 226 ug/dL  Folate   Collection Time: 12/14/23 10:07 AM  Result Value Ref Range   Folate 6.1 >5.9 ng/mL  Vitamin B12   Collection Time: 12/14/23 10:07 AM  Result Value Ref Range   Vitamin B-12 285 180 - 914 pg/mL      Assessment & Plan:   Problem List Items Addressed This Visit     Centrilobular emphysema (HCC)   Relevant Medications   albuterol  (VENTOLIN  HFA) 108 (90 Base) MCG/ACT inhaler   Displaced fracture of left femoral  neck (HCC) - Primary   Relevant Medications   rivaroxaban (XARELTO) 10 MG TABS tablet   Other Visit Diagnoses       S/p left hip fracture         Prophylactic use of direct thrombin inhibitor for venous thromboembolism (VTE)       Relevant Medications   rivaroxaban (XARELTO) 10 MG TABS tablet       Left hip fracture Hospitalization follow-up Surgical repair completed on Dec 13, 2023, with subsequent physical therapy. Discharged on Dec 14, 2023. - Follow-up with orthopedic specialist on Dec 28, 2023.  Prophylactic anticoagulation therapy Lovenox  initiated post-surgery.  Unable to take the self injections, and they were not refrigerated he requests pills,  he has missed dosing for 7 days - Prescribe seven days of Lovenox  in pill form. - Instruct him to dispose of unused syringes at the pharmacy. - Ensure he contacts the pharmacy to confirm medication availability.  Inhaler use for respiratory condition Ran out of Air Supra inhaler, which is costly and not covered. Lacks albuterol  inhaler. - Order albuterol  inhaler. - Update contact information for medication access.      Route to Roxie Cord RN for VBCI team, we have again been unsuccessful in providing care coordination and care management services for him and managing his care outside of appointments and hospitalizations has been extremely difficult due to lack of follow up and communication and access on his end. I will again bring this issue up with our VBCI team to find out what other resources we can try.  No orders of the defined types were placed in this encounter.   Meds ordered this encounter  Medications   rivaroxaban (XARELTO) 10 MG TABS tablet    Sig: Take 1 tablet (10 mg total) by mouth daily.    Dispense:  7 tablet    Refill:  0   albuterol  (VENTOLIN  HFA) 108 (90 Base) MCG/ACT inhaler    Sig: Inhale 2 puffs into the lungs every 4 (four) hours as needed for wheezing or shortness of breath.    Dispense:  8 each     Refill:  5    Follow up plan: Return if symptoms worsen or fail to improve.   Domingo Friend, DO [facility] 12/22/2023, 9:53 AM

## 2023-12-28 DIAGNOSIS — S72002A Fracture of unspecified part of neck of left femur, initial encounter for closed fracture: Secondary | ICD-10-CM | POA: Diagnosis not present

## 2024-02-09 DIAGNOSIS — Z96642 Presence of left artificial hip joint: Secondary | ICD-10-CM | POA: Diagnosis not present

## 2024-02-23 ENCOUNTER — Telehealth: Payer: Self-pay

## 2024-02-23 NOTE — Patient Instructions (Signed)
 David [name] - I am sorry I was unable to reach you today for our scheduled appointment. I work with Edman, Marsa PARAS, DO and am calling to support your healthcare needs. Please contact me at [phone] at your earliest convenience. I look forward to speaking with you soon.   Thank you,  Jackson Acron [facility] RN Care Manager Direct Dial: [phone]  Fax: [phone] Website: [email or web address]

## 2024-03-16 ENCOUNTER — Ambulatory Visit (INDEPENDENT_AMBULATORY_CARE_PROVIDER_SITE_OTHER): Admitting: Family Medicine

## 2024-03-16 ENCOUNTER — Encounter: Payer: Self-pay | Admitting: Family Medicine

## 2024-03-16 VITALS — BP 110/60 | HR 79 | Ht 69.0 in | Wt 104.2 lb

## 2024-03-16 DIAGNOSIS — R252 Cramp and spasm: Secondary | ICD-10-CM | POA: Diagnosis not present

## 2024-03-16 DIAGNOSIS — R202 Paresthesia of skin: Secondary | ICD-10-CM | POA: Diagnosis not present

## 2024-03-16 DIAGNOSIS — M5441 Lumbago with sciatica, right side: Secondary | ICD-10-CM | POA: Diagnosis not present

## 2024-03-16 DIAGNOSIS — G8929 Other chronic pain: Secondary | ICD-10-CM

## 2024-03-16 MED ORDER — BACLOFEN 10 MG PO TABS: 5.0000 mg | ORAL_TABLET | Freq: Three times a day (TID) | ORAL | 2 refills | Status: AC | PRN

## 2024-03-16 MED ORDER — PREDNISONE 20 MG PO TABS: ORAL_TABLET | ORAL | 0 refills | Status: AC

## 2024-03-16 NOTE — Patient Instructions (Addendum)
 Thank you for coming to the office today.  For the Right Leg numbness pins and needles, it can be related to the back and spine.  Start the Prednisone  taper 7 days follow instructions, take that with food. It can reduce any nerve pinching inflammation symptoms  Start taking Baclofen  (Lioresal ) 10mg  (muscle relaxant) - start with half (cut) to one whole pill at night as needed for next 1-3 nights (may make you drowsy, caution with driving) see how it affects you, then if tolerated increase to one pill 2 to 3 times a day or (every 8 hours as needed)  I recommend return for an X-ray of the Lumbar low back if it does not improve. You can walk in anytime Mon-Thurs 8am-4pm here   Leg Cramping  Hyland's Leg Cramp OTC medication for electrolytes to treat leg cramping If you have a   Also- Try spoonful of yellow mustard to relieve leg cramps or try daily to prevent the problem   Please schedule a Follow-up Appointment to: Return if symptoms worsen or fail to improve.  If you have any other questions or concerns, please feel free to call the office or send a message through MyChart. You may also schedule an earlier appointment if necessary.  Additionally, you may be receiving a survey about your experience at our office within a few days to 1 week by e-mail or mail. We value your feedback.  Marsa Officer, DO [facility], [place]

## 2024-03-16 NOTE — Progress Notes (Signed)
 Subjective:    Patient ID: David [name], male    DOB: 12/10/1955, 68 y.o.   MRN: [id number]  David [name] is a 68 y.o. male presenting on 03/16/2024 for Leg Pain (When laying down feels like legs are cramping about 2 weeks)   HPI  Discussed the use of AI scribe software for clinical note transcription with the patient, who gave verbal consent to proceed.  History of Present Illness   David [name] is a 68 year old male who presents with bilateral leg cramping and numbness.  Bilateral lower extremity neuromuscular symptoms - Cramping in both legs mostly LEFT side, more pronounced at night and when lying down - Right leg numbness with 'pins and needles' sensation radiating to the toes - Symptoms frequently disrupt sleep - Right leg feel at times has episodic paresthesia, but not painful. Not having back pain either - Symptoms are described as a nuisance rather than painful - No associated back pain  Medication and symptom management - No regular medications - No medication taken for leg cramping - Occasionally takes baby aspirin  - Has tried sports drinks such as Gatorade or Powerade for electrolyte replenishment  Musculoskeletal surgical history - History of left hip replacement surgery - No recent imaging studies of the back           06/10/2023    3:16 PM 04/20/2023    3:47 PM 09/18/2021    8:29 AM  Depression screen PHQ 2/9  Decreased Interest 0 0 0  Down, Depressed, Hopeless 0 3 3  PHQ - 2 Score 0 3 3  Altered sleeping 0 3 3  Tired, decreased energy 0 3 3  Change in appetite 0 3 3  Feeling bad or failure about yourself  0 3 3  Trouble concentrating 0 0 0  Moving slowly or fidgety/restless 0 0 0  Suicidal thoughts 0 0 0  PHQ-9 Score 0 15 15  Difficult doing work/chores Not difficult at all Not difficult at all Not difficult at all       09/18/2021    8:30 AM  GAD 7 : Generalized Anxiety Score  Nervous, Anxious, on Edge 0  Control/stop worrying 0   Worry too much - different things 0  Trouble relaxing 0  Restless 3  Easily annoyed or irritable 3  Afraid - awful might happen 3  Total GAD 7 Score 9  Anxiety Difficulty Not difficult at all    Social History   Tobacco Use   Smoking status: Every Day    Current packs/day: 1.00    Average packs/day: 1 pack/day for 52.1 years (52.1 ttl pk-yrs)    Types: Cigarettes    Start date: 08/12/1994   Smokeless tobacco: Never   Tobacco comments:    Smoking 1 ppd s of 11/12/2021 hfb   Vaping Use   Vaping status: Never Used  Substance Use Topics   Alcohol use: No    Alcohol/week: 0.0 standard drinks of alcohol   Drug use: Yes    Types: Crack cocaine    Comment: 02/14/23    Review of Systems Per HPI unless specifically indicated above     Objective:    BP 110/60 (BP Location: Left Arm, Patient Position: Sitting, Cuff Size: Normal)   Pulse 79   Ht 5' 9 (1.753 m)   Wt 104 lb 4 oz (47.3 kg)   SpO2 99%   BMI 15.40 kg/m   Wt Readings from Last 3 Encounters:  03/16/24 104 lb  4 oz (47.3 kg)  12/22/23 103 lb (46.7 kg)  12/13/23 106 lb 0.7 oz (48.1 kg)    Physical Exam Vitals and nursing note reviewed.  Constitutional:      General: He is not in acute distress.    Appearance: Normal appearance. He is well-developed. He is not diaphoretic.     Comments: Thin chronically ill frail appearance, mild discomfort  HENT:     Head: Normocephalic and atraumatic.  Eyes:     General:        Right eye: No discharge.        Left eye: No discharge.     Conjunctiva/sclera: Conjunctivae normal.  Cardiovascular:     Rate and Rhythm: Normal rate.  Pulmonary:     Effort: Pulmonary effort is normal.  Musculoskeletal:     Right lower leg: No edema.     Left lower leg: No edema.     Comments: Thin body habitus, no obvious new deformity or abnormality  Skin:    General: Skin is warm and dry.     Findings: No erythema or rash.  Neurological:     Mental Status: He is alert and oriented to  person, place, and time.     Comments: Has intact sensation in R lower extremity but some provoked paresthesia  Psychiatric:        Mood and Affect: Mood normal.        Behavior: Behavior normal.        Thought Content: Thought content normal.     Comments: Well groomed, good eye contact, normal speech and thoughts     Results for orders placed or performed during the hospital encounter of 12/13/23  CBC with Differential/Platelet   Collection Time: 12/13/23 11:30 AM  Result Value Ref Range   WBC 5.8 4.0 - 10.5 K/uL   RBC 3.86 (L) 4.22 - 5.81 MIL/uL   Hemoglobin 11.3 (L) 13.0 - 17.0 g/dL   HCT 65.3 (L) 60.9 - 47.9 %   MCV 89.6 80.0 - 100.0 fL   MCH 29.3 26.0 - 34.0 pg   MCHC 32.7 30.0 - 36.0 g/dL   RDW 86.1 88.4 - 84.4 %   Platelets 233 150 - 400 K/uL   nRBC 0.0 0.0 - 0.2 %   Neutrophils Relative % 62 %   Neutro Abs 3.7 1.7 - 7.7 K/uL   Lymphocytes Relative 22 %   Lymphs Abs 1.3 0.7 - 4.0 K/uL   Monocytes Relative 9 %   Monocytes Absolute 0.5 0.1 - 1.0 K/uL   Eosinophils Relative 5 %   Eosinophils Absolute 0.3 0.0 - 0.5 K/uL   Basophils Relative 1 %   Basophils Absolute 0.0 0.0 - 0.1 K/uL   Immature Granulocytes 1 %   Abs Immature Granulocytes 0.04 0.00 - 0.07 K/uL  Basic metabolic panel   Collection Time: 12/13/23 11:30 AM  Result Value Ref Range   Sodium 138 135 - 145 mmol/L   Potassium 3.7 3.5 - 5.1 mmol/L   Chloride 103 98 - 111 mmol/L   CO2 26 22 - 32 mmol/L   Glucose, Bld 73 70 - 99 mg/dL   BUN 10 8 - 23 mg/dL   Creatinine, Ser 9.21 0.61 - 1.24 mg/dL   Calcium 8.7 (L) 8.9 - 10.3 mg/dL   GFR, Estimated >39 >39 mL/min   Anion gap 9 5 - 15  Protime-INR   Collection Time: 12/13/23 11:30 AM  Result Value Ref Range   Prothrombin Time 13.0 11.4 -  15.2 seconds   INR 1.0 0.8 - 1.2  APTT   Collection Time: 12/13/23 11:30 AM  Result Value Ref Range   aPTT 35 24 - 36 seconds  Type and screen   Collection Time: 12/13/23 12:05 PM  Result Value Ref Range   ABO/RH(D)  A POS    Antibody Screen NEG    Sample Expiration      12/16/2023,2359 Performed at [facility], [address] [place], [place] [address]   VITAMIN D  25 Hydroxy (Vit-D Deficiency, Fractures)   Collection Time: 12/13/23  1:30 PM  Result Value Ref Range   Vit D, 25-Hydroxy 29.99 (L) 30 - 100 ng/mL  HIV Antibody (routine testing w rflx)   Collection Time: 12/13/23  1:30 PM  Result Value Ref Range   HIV Screen 4th Generation wRfx Non Reactive Non Reactive  CBC   Collection Time: 12/14/23  3:41 AM  Result Value Ref Range   WBC 8.4 4.0 - 10.5 K/uL   RBC 3.32 (L) 4.22 - 5.81 MIL/uL   Hemoglobin 9.8 (L) 13.0 - 17.0 g/dL   HCT 70.5 (L) 60.9 - 47.9 %   MCV 88.6 80.0 - 100.0 fL   MCH 29.5 26.0 - 34.0 pg   MCHC 33.3 30.0 - 36.0 g/dL   RDW 86.4 88.4 - 84.4 %   Platelets 205 150 - 400 K/uL   nRBC 0.0 0.0 - 0.2 %  Basic metabolic panel   Collection Time: 12/14/23  3:41 AM  Result Value Ref Range   Sodium 132 (L) 135 - 145 mmol/L   Potassium 4.2 3.5 - 5.1 mmol/L   Chloride 100 98 - 111 mmol/L   CO2 24 22 - 32 mmol/L   Glucose, Bld 162 (H) 70 - 99 mg/dL   BUN 13 8 - 23 mg/dL   Creatinine, Ser 9.14 0.61 - 1.24 mg/dL   Calcium 8.5 (L) 8.9 - 10.3 mg/dL   GFR, Estimated >39 >39 mL/min   Anion gap 8 5 - 15  Iron  and TIBC   Collection Time: 12/14/23 10:07 AM  Result Value Ref Range   Iron  27 (L) 45 - 182 ug/dL   TIBC 746 749 - 549 ug/dL   Saturation Ratios 11 (L) 17.9 - 39.5 %   UIBC 226 ug/dL  Folate   Collection Time: 12/14/23 10:07 AM  Result Value Ref Range   Folate 6.1 >5.9 ng/mL  Vitamin B12   Collection Time: 12/14/23 10:07 AM  Result Value Ref Range   Vitamin B-12 285 180 - 914 pg/mL      Assessment & Plan:   Problem List Items Addressed This Visit   None Visit Diagnoses       Paresthesia of right leg    -  Primary   Relevant Medications   predniSONE  (DELTASONE ) 20 MG tablet   baclofen  (LIORESAL ) 10 MG tablet     Leg cramping         Chronic bilateral  low back pain with right-sided sciatica       Relevant Medications   predniSONE  (DELTASONE ) 20 MG tablet   baclofen  (LIORESAL ) 10 MG tablet   Other Relevant Orders   DG Lumbar Spine Complete       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 Noted difficulty with coordination of care We have been attempting to connect him with [facility] however previously within past 3 months unsuccessful outreach due to access and communication barriers and he has declined this service  Right leg numbness  and paresthesia Numbness and paresthesia in the right leg, likely due to lumbar nerve compression. Symptoms are bothersome but not painful. - Initiate prednisone  taper for 7 days: 3 pills for the first two days, 2 pills for the next two days, and 1 pill for the last three days. Take with food. - Order lumbar spine x-ray if symptoms do not improve. - Consider baclofen  if symptoms persist.  Possible lumbar radiculopathy Possible lumbar radiculopathy affecting the right leg due to lumbar nerve compression. - Order lumbar spine x-ray if symptoms do not improve with initial treatment. - Rx baclofen  if symptoms persist.  Left leg cramping Nocturnal cramping in the left leg, likely due to hydration and electrolyte imbalance. - Increase intake of electrolytes and water. - Use Hylinz leg cramp tablets as needed. - Try a spoonful of yellow mustard for cramp relief.        Orders Placed This Encounter  Procedures   DG Lumbar Spine Complete    Standing Status:   Future    Expiration Date:   03/16/2025    Reason for Exam (SYMPTOM  OR DIAGNOSIS REQUIRED):   chronic low back pain, right leg paresthesia    Preferred imaging location?:   [facility]    Meds ordered this encounter  Medications   predniSONE  (DELTASONE ) 20 MG tablet    Sig: Take daily with food. Start with 60mg  (3 pills) x 2 days, then reduce to 40mg  (2 pills) x 2 days, then 20mg  (1 pill) x 3 days    Dispense:  13 tablet     Refill:  0   baclofen  (LIORESAL ) 10 MG tablet    Sig: Take 0.5-1 tablets (5-10 mg total) by mouth 3 (three) times daily as needed for muscle spasms.    Dispense:  30 each    Refill:  2    Follow up plan: Return if symptoms worsen or fail to improve.   Marsa Officer, DO [facility] 03/16/2024, 8:15 AM

## 2024-03-21 ENCOUNTER — Ambulatory Visit
Admission: RE | Admit: 2024-03-21 | Discharge: 2024-03-21 | Disposition: A | Discharge status: Home / Self Care | Source: Ambulatory Visit | Attending: Family Medicine | Admitting: Family Medicine

## 2024-03-21 DIAGNOSIS — G8929 Other chronic pain: Secondary | ICD-10-CM | POA: Diagnosis not present

## 2024-03-21 DIAGNOSIS — M47816 Spondylosis without myelopathy or radiculopathy, lumbar region: Secondary | ICD-10-CM | POA: Diagnosis not present

## 2024-03-21 DIAGNOSIS — M5441 Lumbago with sciatica, right side: Secondary | ICD-10-CM | POA: Insufficient documentation

## 2024-03-21 DIAGNOSIS — M47817 Spondylosis without myelopathy or radiculopathy, lumbosacral region: Secondary | ICD-10-CM | POA: Diagnosis not present

## 2024-03-21 DIAGNOSIS — M4807 Spinal stenosis, lumbosacral region: Secondary | ICD-10-CM | POA: Diagnosis not present

## 2024-04-04 ENCOUNTER — Telehealth: Payer: Self-pay

## 2024-04-04 DIAGNOSIS — Z96642 Presence of left artificial hip joint: Secondary | ICD-10-CM | POA: Diagnosis not present

## 2024-04-05 ENCOUNTER — Ambulatory Visit: Payer: Self-pay | Admitting: Family Medicine

## 2024-04-06 ENCOUNTER — Other Ambulatory Visit: Payer: Self-pay | Admitting: Family Medicine

## 2024-04-06 DIAGNOSIS — G8929 Other chronic pain: Secondary | ICD-10-CM

## 2024-04-06 DIAGNOSIS — M51362 Other intervertebral disc degeneration, lumbar region with discogenic back pain and lower extremity pain: Secondary | ICD-10-CM

## 2024-05-15 DIAGNOSIS — Z96642 Presence of left artificial hip joint: Secondary | ICD-10-CM | POA: Diagnosis not present

## 2024-05-15 DIAGNOSIS — M5136 Other intervertebral disc degeneration, lumbar region with discogenic back pain only: Secondary | ICD-10-CM | POA: Diagnosis not present

## 2024-05-15 DIAGNOSIS — M47816 Spondylosis without myelopathy or radiculopathy, lumbar region: Secondary | ICD-10-CM | POA: Diagnosis not present

## 2024-06-15 ENCOUNTER — Ambulatory Visit: Payer: Self-pay
